# Patient Record
Sex: Male | Born: 1950 | Race: White | Hispanic: No | Marital: Single | Smoking: Never smoker
Health system: Southern US, Community
[De-identification: ages and names within clinical notes are randomized; demographics above are authoritative.]

## PROBLEM LIST (undated history)

## (undated) DIAGNOSIS — M1990 Unspecified osteoarthritis, unspecified site: Secondary | ICD-10-CM

## (undated) DIAGNOSIS — J310 Chronic rhinitis: Secondary | ICD-10-CM

## (undated) DIAGNOSIS — I10 Essential (primary) hypertension: Secondary | ICD-10-CM

## (undated) DIAGNOSIS — F32A Depression, unspecified: Secondary | ICD-10-CM

## (undated) DIAGNOSIS — G4730 Sleep apnea, unspecified: Secondary | ICD-10-CM

## (undated) DIAGNOSIS — T7840XA Allergy, unspecified, initial encounter: Secondary | ICD-10-CM

## (undated) DIAGNOSIS — I499 Cardiac arrhythmia, unspecified: Secondary | ICD-10-CM

## (undated) DIAGNOSIS — F329 Major depressive disorder, single episode, unspecified: Secondary | ICD-10-CM

## (undated) DIAGNOSIS — J329 Chronic sinusitis, unspecified: Secondary | ICD-10-CM

## (undated) DIAGNOSIS — R972 Elevated prostate specific antigen [PSA]: Secondary | ICD-10-CM

## (undated) DIAGNOSIS — F419 Anxiety disorder, unspecified: Secondary | ICD-10-CM

## (undated) DIAGNOSIS — J189 Pneumonia, unspecified organism: Secondary | ICD-10-CM

## (undated) HISTORY — PX: ECTROPION REPAIR: SHX357

## (undated) HISTORY — DX: Unspecified osteoarthritis, unspecified site: M19.90

## (undated) HISTORY — PX: HERNIA REPAIR: SHX51

## (undated) HISTORY — DX: Essential (primary) hypertension: I10

## (undated) HISTORY — DX: Elevated prostate specific antigen (PSA): R97.20

## (undated) HISTORY — PX: NASAL SINUS SURGERY: SHX719

## (undated) HISTORY — PX: OTHER SURGICAL HISTORY: SHX169

## (undated) HISTORY — DX: Chronic rhinitis: J31.0

## (undated) HISTORY — DX: Depression, unspecified: F32.A

## (undated) HISTORY — DX: Anxiety disorder, unspecified: F41.9

## (undated) HISTORY — DX: Chronic sinusitis, unspecified: J32.9

## (undated) HISTORY — DX: Allergy, unspecified, initial encounter: T78.40XA

## (undated) HISTORY — DX: Major depressive disorder, single episode, unspecified: F32.9

## (undated) HISTORY — DX: Sleep apnea, unspecified: G47.30

## (undated) HISTORY — DX: Pneumonia, unspecified organism: J18.9

## (undated) HISTORY — PX: FACIAL RECONSTRUCTION SURGERY: SHX631

---

## 1997-09-08 ENCOUNTER — Ambulatory Visit: Admission: RE | Admit: 1997-09-08 | Discharge: 1997-09-08 | Payer: Self-pay | Admitting: Otolaryngology

## 1997-09-08 ENCOUNTER — Encounter: Payer: Self-pay | Admitting: Internal Medicine

## 2000-06-24 ENCOUNTER — Encounter: Payer: Self-pay | Admitting: Internal Medicine

## 2000-06-24 ENCOUNTER — Ambulatory Visit (HOSPITAL_BASED_OUTPATIENT_CLINIC_OR_DEPARTMENT_OTHER): Admission: RE | Admit: 2000-06-24 | Discharge: 2000-06-24 | Payer: Self-pay | Admitting: Internal Medicine

## 2002-01-08 ENCOUNTER — Encounter: Payer: Self-pay | Admitting: Emergency Medicine

## 2002-01-08 ENCOUNTER — Emergency Department (HOSPITAL_COMMUNITY): Admission: EM | Admit: 2002-01-08 | Discharge: 2002-01-08 | Payer: Self-pay | Admitting: Emergency Medicine

## 2003-04-01 DIAGNOSIS — J189 Pneumonia, unspecified organism: Secondary | ICD-10-CM

## 2003-04-01 HISTORY — DX: Pneumonia, unspecified organism: J18.9

## 2003-09-26 ENCOUNTER — Encounter: Payer: Self-pay | Admitting: Internal Medicine

## 2003-09-26 ENCOUNTER — Ambulatory Visit (HOSPITAL_BASED_OUTPATIENT_CLINIC_OR_DEPARTMENT_OTHER): Admission: RE | Admit: 2003-09-26 | Discharge: 2003-09-26 | Payer: Self-pay | Admitting: Internal Medicine

## 2004-03-01 ENCOUNTER — Ambulatory Visit: Payer: Self-pay | Admitting: Internal Medicine

## 2004-03-01 ENCOUNTER — Ambulatory Visit: Payer: Self-pay | Admitting: Endocrinology

## 2004-03-01 ENCOUNTER — Inpatient Hospital Stay (HOSPITAL_COMMUNITY): Admission: EM | Admit: 2004-03-01 | Discharge: 2004-03-04 | Payer: Self-pay | Admitting: Emergency Medicine

## 2004-03-06 ENCOUNTER — Ambulatory Visit: Payer: Self-pay | Admitting: Internal Medicine

## 2004-03-11 ENCOUNTER — Ambulatory Visit (HOSPITAL_COMMUNITY): Admission: RE | Admit: 2004-03-11 | Discharge: 2004-03-11 | Payer: Self-pay | Admitting: Internal Medicine

## 2004-03-11 ENCOUNTER — Ambulatory Visit: Payer: Self-pay | Admitting: Internal Medicine

## 2004-04-05 ENCOUNTER — Ambulatory Visit (HOSPITAL_COMMUNITY): Admission: RE | Admit: 2004-04-05 | Discharge: 2004-04-05 | Payer: Self-pay | Admitting: Internal Medicine

## 2004-04-05 ENCOUNTER — Ambulatory Visit: Payer: Self-pay | Admitting: Internal Medicine

## 2004-04-15 ENCOUNTER — Ambulatory Visit (HOSPITAL_COMMUNITY): Admission: RE | Admit: 2004-04-15 | Discharge: 2004-04-15 | Payer: Self-pay | Admitting: Internal Medicine

## 2004-04-19 ENCOUNTER — Ambulatory Visit: Payer: Self-pay | Admitting: Internal Medicine

## 2004-05-03 ENCOUNTER — Ambulatory Visit: Payer: Self-pay | Admitting: Internal Medicine

## 2004-05-20 ENCOUNTER — Ambulatory Visit (HOSPITAL_COMMUNITY): Admission: RE | Admit: 2004-05-20 | Discharge: 2004-05-20 | Payer: Self-pay | Admitting: Internal Medicine

## 2004-05-21 ENCOUNTER — Ambulatory Visit: Payer: Self-pay | Admitting: Internal Medicine

## 2004-08-23 ENCOUNTER — Ambulatory Visit: Payer: Self-pay | Admitting: Internal Medicine

## 2004-08-29 ENCOUNTER — Ambulatory Visit: Payer: Self-pay | Admitting: Gastroenterology

## 2004-09-16 ENCOUNTER — Ambulatory Visit: Payer: Self-pay | Admitting: Gastroenterology

## 2004-09-16 ENCOUNTER — Encounter (INDEPENDENT_AMBULATORY_CARE_PROVIDER_SITE_OTHER): Payer: Self-pay | Admitting: *Deleted

## 2004-12-05 ENCOUNTER — Ambulatory Visit: Payer: Self-pay | Admitting: Internal Medicine

## 2005-01-29 ENCOUNTER — Ambulatory Visit: Payer: Self-pay | Admitting: Internal Medicine

## 2005-02-11 ENCOUNTER — Ambulatory Visit: Payer: Self-pay | Admitting: Internal Medicine

## 2005-04-01 ENCOUNTER — Ambulatory Visit: Payer: Self-pay | Admitting: Internal Medicine

## 2006-01-07 ENCOUNTER — Ambulatory Visit: Payer: Self-pay | Admitting: Internal Medicine

## 2006-01-12 ENCOUNTER — Ambulatory Visit: Payer: Self-pay | Admitting: Internal Medicine

## 2006-02-12 ENCOUNTER — Ambulatory Visit: Payer: Self-pay | Admitting: Internal Medicine

## 2006-05-13 ENCOUNTER — Ambulatory Visit: Payer: Self-pay | Admitting: Internal Medicine

## 2006-05-13 LAB — CONVERTED CEMR LAB
ALT: 19 units/L (ref 0–40)
AST: 20 units/L (ref 0–37)
Alkaline Phosphatase: 45 units/L (ref 39–117)
BUN: 11 mg/dL (ref 6–23)
Basophils Relative: 0.3 % (ref 0.0–1.0)
CO2: 32 meq/L (ref 19–32)
Calcium: 9.5 mg/dL (ref 8.4–10.5)
Chloride: 107 meq/L (ref 96–112)
Creatinine, Ser: 0.8 mg/dL (ref 0.4–1.5)
HDL: 35.6 mg/dL — ABNORMAL LOW (ref >39.0)
Hemoglobin: 15 g/dL (ref 13.0–17.0)
LDL Cholesterol: 127 mg/dL — ABNORMAL HIGH (ref 0–99)
Monocytes Absolute: 0.9 10*3/uL — ABNORMAL HIGH (ref 0.2–0.7)
Monocytes Relative: 11.7 % — ABNORMAL HIGH (ref 3.0–11.0)
Potassium: 4.5 meq/L (ref 3.5–5.1)
RDW: 12.2 % (ref 11.5–14.6)
Total Bilirubin: 1 mg/dL (ref 0.3–1.2)
Total Protein: 6.8 g/dL (ref 6.0–8.3)
VLDL: 17 mg/dL (ref 0–40)

## 2006-05-20 ENCOUNTER — Ambulatory Visit: Payer: Self-pay | Admitting: Internal Medicine

## 2006-08-17 ENCOUNTER — Ambulatory Visit: Payer: Self-pay | Admitting: Internal Medicine

## 2007-01-04 ENCOUNTER — Telehealth: Payer: Self-pay | Admitting: Family Medicine

## 2007-02-01 ENCOUNTER — Encounter: Payer: Self-pay | Admitting: Internal Medicine

## 2007-02-09 ENCOUNTER — Ambulatory Visit: Payer: Self-pay | Admitting: Internal Medicine

## 2007-02-09 DIAGNOSIS — G4733 Obstructive sleep apnea (adult) (pediatric): Secondary | ICD-10-CM

## 2007-02-09 DIAGNOSIS — F5104 Psychophysiologic insomnia: Secondary | ICD-10-CM

## 2007-02-09 DIAGNOSIS — J4520 Mild intermittent asthma, uncomplicated: Secondary | ICD-10-CM

## 2007-02-09 DIAGNOSIS — F329 Major depressive disorder, single episode, unspecified: Secondary | ICD-10-CM

## 2007-02-09 DIAGNOSIS — I10 Essential (primary) hypertension: Secondary | ICD-10-CM | POA: Insufficient documentation

## 2007-02-15 ENCOUNTER — Ambulatory Visit: Payer: Self-pay | Admitting: Internal Medicine

## 2007-03-01 HISTORY — PX: VENTRAL HERNIA REPAIR: SHX424

## 2007-03-12 ENCOUNTER — Encounter: Payer: Self-pay | Admitting: Internal Medicine

## 2007-03-16 ENCOUNTER — Ambulatory Visit (HOSPITAL_COMMUNITY): Admission: RE | Admit: 2007-03-16 | Discharge: 2007-03-16 | Payer: Self-pay | Admitting: Surgery

## 2007-03-16 ENCOUNTER — Encounter: Payer: Self-pay | Admitting: Internal Medicine

## 2007-04-05 ENCOUNTER — Encounter: Payer: Self-pay | Admitting: Internal Medicine

## 2007-05-07 ENCOUNTER — Telehealth: Payer: Self-pay | Admitting: Internal Medicine

## 2007-05-07 ENCOUNTER — Ambulatory Visit: Payer: Self-pay | Admitting: Internal Medicine

## 2007-06-15 ENCOUNTER — Telehealth: Payer: Self-pay | Admitting: Internal Medicine

## 2007-07-15 ENCOUNTER — Encounter: Payer: Self-pay | Admitting: Internal Medicine

## 2007-07-19 ENCOUNTER — Telehealth: Payer: Self-pay | Admitting: Internal Medicine

## 2007-08-10 ENCOUNTER — Encounter: Payer: Self-pay | Admitting: Internal Medicine

## 2007-08-16 ENCOUNTER — Ambulatory Visit: Payer: Self-pay | Admitting: Internal Medicine

## 2007-08-31 ENCOUNTER — Telehealth: Payer: Self-pay | Admitting: Internal Medicine

## 2007-09-24 ENCOUNTER — Encounter: Payer: Self-pay | Admitting: Internal Medicine

## 2007-10-04 ENCOUNTER — Telehealth: Payer: Self-pay | Admitting: Internal Medicine

## 2007-11-09 ENCOUNTER — Telehealth (INDEPENDENT_AMBULATORY_CARE_PROVIDER_SITE_OTHER): Payer: Self-pay | Admitting: *Deleted

## 2007-12-15 ENCOUNTER — Telehealth: Payer: Self-pay | Admitting: Internal Medicine

## 2007-12-27 ENCOUNTER — Telehealth: Payer: Self-pay | Admitting: Internal Medicine

## 2008-02-02 ENCOUNTER — Telehealth (INDEPENDENT_AMBULATORY_CARE_PROVIDER_SITE_OTHER): Payer: Self-pay | Admitting: *Deleted

## 2008-02-04 ENCOUNTER — Ambulatory Visit: Payer: Self-pay | Admitting: Gastroenterology

## 2008-02-14 ENCOUNTER — Ambulatory Visit: Payer: Self-pay | Admitting: Gastroenterology

## 2008-03-15 ENCOUNTER — Telehealth (INDEPENDENT_AMBULATORY_CARE_PROVIDER_SITE_OTHER): Payer: Self-pay | Admitting: *Deleted

## 2008-04-03 ENCOUNTER — Ambulatory Visit: Payer: Self-pay | Admitting: Internal Medicine

## 2008-06-01 ENCOUNTER — Encounter: Payer: Self-pay | Admitting: Internal Medicine

## 2008-06-05 ENCOUNTER — Encounter: Payer: Self-pay | Admitting: Internal Medicine

## 2008-06-05 ENCOUNTER — Encounter: Admission: RE | Admit: 2008-06-05 | Discharge: 2008-06-05 | Payer: Self-pay | Admitting: Surgery

## 2008-06-12 ENCOUNTER — Telehealth: Payer: Self-pay | Admitting: Internal Medicine

## 2008-07-07 ENCOUNTER — Telehealth: Payer: Self-pay | Admitting: Internal Medicine

## 2008-07-18 ENCOUNTER — Telehealth: Payer: Self-pay | Admitting: Internal Medicine

## 2008-07-28 ENCOUNTER — Ambulatory Visit: Payer: Self-pay | Admitting: Internal Medicine

## 2008-07-28 DIAGNOSIS — M25569 Pain in unspecified knee: Secondary | ICD-10-CM | POA: Insufficient documentation

## 2008-08-15 ENCOUNTER — Encounter: Payer: Self-pay | Admitting: Internal Medicine

## 2008-08-15 ENCOUNTER — Telehealth: Payer: Self-pay | Admitting: Internal Medicine

## 2008-08-16 ENCOUNTER — Inpatient Hospital Stay (HOSPITAL_COMMUNITY): Admission: AD | Admit: 2008-08-16 | Discharge: 2008-08-20 | Payer: Self-pay | Admitting: Surgery

## 2008-08-16 ENCOUNTER — Encounter (INDEPENDENT_AMBULATORY_CARE_PROVIDER_SITE_OTHER): Payer: Self-pay | Admitting: Surgery

## 2008-08-18 ENCOUNTER — Encounter: Payer: Self-pay | Admitting: Internal Medicine

## 2008-08-23 ENCOUNTER — Encounter: Payer: Self-pay | Admitting: Internal Medicine

## 2008-08-23 ENCOUNTER — Telehealth: Payer: Self-pay | Admitting: Internal Medicine

## 2008-08-30 ENCOUNTER — Telehealth: Payer: Self-pay | Admitting: *Deleted

## 2008-09-05 ENCOUNTER — Ambulatory Visit: Payer: Self-pay | Admitting: Internal Medicine

## 2008-09-05 LAB — CONVERTED CEMR LAB
AST: 16 units/L (ref 0–37)
Albumin: 4.1 g/dL (ref 3.5–5.2)
Alkaline Phosphatase: 48 units/L (ref 39–117)
Basophils Absolute: 0 10*3/uL (ref 0.0–0.1)
Basophils Relative: 0.2 % (ref 0.0–3.0)
Bilirubin, Direct: 0.1 mg/dL (ref 0.0–0.3)
Blood in Urine, dipstick: NEGATIVE
Calcium: 8.8 mg/dL (ref 8.4–10.5)
Creatinine, Ser: 0.7 mg/dL (ref 0.4–1.5)
GFR calc non Af Amer: 123.57 mL/min (ref >60)
HDL: 31.3 mg/dL — ABNORMAL LOW (ref >39.00)
Hemoglobin: 13.8 g/dL (ref 13.0–17.0)
Lymphocytes Relative: 16.2 % (ref 12.0–46.0)
Monocytes Relative: 13.3 % — ABNORMAL HIGH (ref 3.0–12.0)
Neutro Abs: 4.9 10*3/uL (ref 1.4–7.7)
Neutrophils Relative %: 63.3 % (ref 43.0–77.0)
Nitrite: NEGATIVE
Protein, U semiquant: NEGATIVE
RBC: 4.05 M/uL — ABNORMAL LOW (ref 4.22–5.81)
RDW: 13.1 % (ref 11.5–14.6)
Sodium: 138 meq/L (ref 135–145)
Total CHOL/HDL Ratio: 5
Triglycerides: 74 mg/dL (ref 0.0–149.0)
Urobilinogen, UA: 0.2
VLDL: 14.8 mg/dL (ref 0.0–40.0)

## 2008-09-15 ENCOUNTER — Telehealth (INDEPENDENT_AMBULATORY_CARE_PROVIDER_SITE_OTHER): Payer: Self-pay | Admitting: *Deleted

## 2008-09-22 ENCOUNTER — Telehealth: Payer: Self-pay | Admitting: *Deleted

## 2008-09-22 ENCOUNTER — Ambulatory Visit: Payer: Self-pay | Admitting: Internal Medicine

## 2008-09-22 DIAGNOSIS — D649 Anemia, unspecified: Secondary | ICD-10-CM

## 2008-09-22 DIAGNOSIS — R972 Elevated prostate specific antigen [PSA]: Secondary | ICD-10-CM

## 2008-10-13 ENCOUNTER — Ambulatory Visit: Payer: Self-pay | Admitting: Internal Medicine

## 2008-11-02 ENCOUNTER — Ambulatory Visit: Payer: Self-pay | Admitting: Internal Medicine

## 2008-11-02 LAB — CONVERTED CEMR LAB
Basophils Relative: 0.1 % (ref 0.0–3.0)
Eosinophils Absolute: 0.7 10*3/uL (ref 0.0–0.7)
Eosinophils Relative: 10.1 % — ABNORMAL HIGH (ref 0.0–5.0)
HCT: 39.2 % (ref 39.0–52.0)
Hemoglobin: 14 g/dL (ref 13.0–17.0)
Lymphs Abs: 0.7 10*3/uL (ref 0.7–4.0)
MCHC: 35.8 g/dL (ref 30.0–36.0)
MCV: 97 fL (ref 78.0–100.0)
Monocytes Absolute: 0.7 10*3/uL (ref 0.1–1.0)
Neutro Abs: 4.9 10*3/uL (ref 1.4–7.7)
Neutrophils Relative %: 69.8 % (ref 43.0–77.0)
RBC: 4.04 M/uL — ABNORMAL LOW (ref 4.22–5.81)
Saturation Ratios: 14.8 % — ABNORMAL LOW (ref 20.0–50.0)
Transferrin: 227.3 mg/dL (ref 212.0–360.0)
WBC: 7 10*3/uL (ref 4.5–10.5)

## 2008-12-07 ENCOUNTER — Telehealth (INDEPENDENT_AMBULATORY_CARE_PROVIDER_SITE_OTHER): Payer: Self-pay | Admitting: *Deleted

## 2008-12-12 ENCOUNTER — Ambulatory Visit: Payer: Self-pay | Admitting: Internal Medicine

## 2008-12-12 DIAGNOSIS — D696 Thrombocytopenia, unspecified: Secondary | ICD-10-CM | POA: Insufficient documentation

## 2008-12-20 ENCOUNTER — Telehealth: Payer: Self-pay | Admitting: *Deleted

## 2008-12-28 ENCOUNTER — Encounter: Payer: Self-pay | Admitting: Internal Medicine

## 2009-01-12 ENCOUNTER — Telehealth: Payer: Self-pay | Admitting: *Deleted

## 2009-01-18 ENCOUNTER — Telehealth: Payer: Self-pay | Admitting: Internal Medicine

## 2009-03-08 ENCOUNTER — Ambulatory Visit: Payer: Self-pay | Admitting: Internal Medicine

## 2009-03-08 LAB — CONVERTED CEMR LAB
Basophils Absolute: 0 10*3/uL (ref 0.0–0.1)
Eosinophils Relative: 8.2 % — ABNORMAL HIGH (ref 0.0–5.0)
HCT: 41.2 % (ref 39.0–52.0)
Hemoglobin: 14.1 g/dL (ref 13.0–17.0)
Lymphocytes Relative: 15 % (ref 12.0–46.0)
Lymphs Abs: 1.1 10*3/uL (ref 0.7–4.0)
Monocytes Relative: 13.2 % — ABNORMAL HIGH (ref 3.0–12.0)
Neutro Abs: 4.5 10*3/uL (ref 1.4–7.7)
RBC: 4.15 M/uL — ABNORMAL LOW (ref 4.22–5.81)
RDW: 12.7 % (ref 11.5–14.6)
WBC: 7.1 10*3/uL (ref 4.5–10.5)

## 2009-03-14 ENCOUNTER — Telehealth: Payer: Self-pay | Admitting: Internal Medicine

## 2009-03-14 ENCOUNTER — Ambulatory Visit: Payer: Self-pay | Admitting: Internal Medicine

## 2009-03-14 DIAGNOSIS — M7580 Other shoulder lesions, unspecified shoulder: Secondary | ICD-10-CM

## 2009-03-29 ENCOUNTER — Encounter: Payer: Self-pay | Admitting: Internal Medicine

## 2009-03-31 HISTORY — PX: OTHER SURGICAL HISTORY: SHX169

## 2009-04-09 ENCOUNTER — Telehealth: Payer: Self-pay | Admitting: Internal Medicine

## 2009-05-03 ENCOUNTER — Ambulatory Visit (HOSPITAL_COMMUNITY): Admission: RE | Admit: 2009-05-03 | Discharge: 2009-05-04 | Payer: Self-pay | Admitting: Orthopedic Surgery

## 2009-05-14 ENCOUNTER — Telehealth: Payer: Self-pay | Admitting: Internal Medicine

## 2009-05-15 ENCOUNTER — Ambulatory Visit: Payer: Self-pay | Admitting: Internal Medicine

## 2009-05-17 ENCOUNTER — Telehealth: Payer: Self-pay | Admitting: Internal Medicine

## 2009-07-02 ENCOUNTER — Telehealth: Payer: Self-pay | Admitting: Internal Medicine

## 2009-07-09 ENCOUNTER — Telehealth: Payer: Self-pay | Admitting: *Deleted

## 2009-07-10 ENCOUNTER — Ambulatory Visit: Payer: Self-pay | Admitting: Internal Medicine

## 2009-07-11 ENCOUNTER — Telehealth: Payer: Self-pay | Admitting: Internal Medicine

## 2009-08-14 ENCOUNTER — Telehealth (INDEPENDENT_AMBULATORY_CARE_PROVIDER_SITE_OTHER): Payer: Self-pay | Admitting: *Deleted

## 2009-09-10 ENCOUNTER — Ambulatory Visit: Payer: Self-pay | Admitting: Internal Medicine

## 2009-09-10 LAB — CONVERTED CEMR LAB
Bilirubin Urine: NEGATIVE
Blood in Urine, dipstick: NEGATIVE
Ketones, urine, test strip: NEGATIVE
Nitrite: NEGATIVE
Specific Gravity, Urine: 1.02
Urobilinogen, UA: 0.2

## 2009-09-11 LAB — CONVERTED CEMR LAB
BUN: 17 mg/dL (ref 6–23)
Basophils Absolute: 0 10*3/uL (ref 0.0–0.1)
Bilirubin, Direct: 0.1 mg/dL (ref 0.0–0.3)
CO2: 29 meq/L (ref 19–32)
Calcium: 9 mg/dL (ref 8.4–10.5)
Chloride: 108 meq/L (ref 96–112)
Cholesterol: 150 mg/dL (ref 0–200)
Creatinine, Ser: 0.6 mg/dL (ref 0.4–1.5)
Eosinophils Absolute: 0.6 10*3/uL (ref 0.0–0.7)
HDL: 33.3 mg/dL — ABNORMAL LOW (ref >39.00)
LDL Cholesterol: 108 mg/dL — ABNORMAL HIGH (ref 0–99)
Lymphocytes Relative: 16.1 % (ref 12.0–46.0)
MCHC: 34.8 g/dL (ref 30.0–36.0)
MCV: 98.1 fL (ref 78.0–100.0)
Monocytes Absolute: 0.8 10*3/uL (ref 0.1–1.0)
Neutrophils Relative %: 62.6 % (ref 43.0–77.0)
PSA: 3.77 ng/mL (ref 0.10–4.00)
Platelets: 151 10*3/uL (ref 150.0–400.0)
RDW: 13.4 % (ref 11.5–14.6)
Total Bilirubin: 0.6 mg/dL (ref 0.3–1.2)
Total Protein: 5.9 g/dL — ABNORMAL LOW (ref 6.0–8.3)
Triglycerides: 43 mg/dL (ref 0.0–149.0)

## 2009-09-12 ENCOUNTER — Encounter: Payer: Self-pay | Admitting: Internal Medicine

## 2009-09-17 ENCOUNTER — Ambulatory Visit: Payer: Self-pay | Admitting: Internal Medicine

## 2009-09-17 DIAGNOSIS — E669 Obesity, unspecified: Secondary | ICD-10-CM

## 2009-09-17 DIAGNOSIS — M1710 Unilateral primary osteoarthritis, unspecified knee: Secondary | ICD-10-CM

## 2009-09-17 DIAGNOSIS — I4510 Unspecified right bundle-branch block: Secondary | ICD-10-CM

## 2009-09-26 ENCOUNTER — Telehealth: Payer: Self-pay | Admitting: Internal Medicine

## 2009-10-15 ENCOUNTER — Telehealth: Payer: Self-pay | Admitting: *Deleted

## 2009-10-17 ENCOUNTER — Encounter: Payer: Self-pay | Admitting: Internal Medicine

## 2009-11-05 ENCOUNTER — Telehealth (INDEPENDENT_AMBULATORY_CARE_PROVIDER_SITE_OTHER): Payer: Self-pay | Admitting: *Deleted

## 2009-12-24 ENCOUNTER — Telehealth (INDEPENDENT_AMBULATORY_CARE_PROVIDER_SITE_OTHER): Payer: Self-pay | Admitting: *Deleted

## 2010-01-28 ENCOUNTER — Ambulatory Visit: Payer: Self-pay | Admitting: Internal Medicine

## 2010-01-29 HISTORY — PX: REPLACEMENT TOTAL KNEE: SUR1224

## 2010-02-04 ENCOUNTER — Inpatient Hospital Stay (HOSPITAL_COMMUNITY): Admission: RE | Admit: 2010-02-04 | Discharge: 2010-02-06 | Payer: Self-pay | Admitting: Orthopedic Surgery

## 2010-02-13 ENCOUNTER — Telehealth (INDEPENDENT_AMBULATORY_CARE_PROVIDER_SITE_OTHER): Payer: Self-pay | Admitting: *Deleted

## 2010-03-01 ENCOUNTER — Ambulatory Visit: Payer: Self-pay | Admitting: Internal Medicine

## 2010-03-21 ENCOUNTER — Ambulatory Visit: Payer: Self-pay | Admitting: Internal Medicine

## 2010-03-21 DIAGNOSIS — Z96659 Presence of unspecified artificial knee joint: Secondary | ICD-10-CM

## 2010-03-21 LAB — CONVERTED CEMR LAB: Hemoglobin: 14.1 g/dL

## 2010-04-05 ENCOUNTER — Encounter: Payer: Self-pay | Admitting: Internal Medicine

## 2010-04-26 ENCOUNTER — Telehealth: Payer: Self-pay | Admitting: *Deleted

## 2010-04-30 ENCOUNTER — Telehealth (INDEPENDENT_AMBULATORY_CARE_PROVIDER_SITE_OTHER): Payer: Self-pay | Admitting: *Deleted

## 2010-04-30 NOTE — Progress Notes (Signed)
Summary: needs refill  Phone Note Call from Patient Call back at [phone]   Caller: Patient Call For: [name] Reason for Call: Refill Medication Summary of Call: patient has an apt on april 12th, his medicine was denied because he hasnt seen dr Debbie Bellucci. he need his nasonex refilled now. he is out. he just needs a rx to last until he can see dr Lovis More he also needs a rx for conscerta, usally its mailed to his home address.  he satated if he didnt get these filled now then he will be needing to go to another doc.   [facility] [address] Initial call taken by: Valinda Hoar,  May 17, 2009 12:33 PM  Follow-up for Phone Call        Please advise if okay to print rx for concerta and if so how much. Thanks. Carron Curie CMA  May 17, 2009 1:54 PM   Ok to refill concerta and nasonex as we did in past and please shedule appt for pt in the next month or so. Reynaldo Minium CMA  May 17, 2009 4:15 PM   rx printed for concerta, pt aaware will be mailed tomorrow, also nasonex rx sent. pt aware.Carron Curie CMA  May 17, 2009 4:28 PM     Prescriptions: NASONEX 50 MCG/ACT SUSP (MOMETASONE FUROATE) 1-2 sprays in each nostril daily  #17 [place] x 2   Entered by:   Carron Curie CMA   Authorized by:   Waymon Budge MD   Signed by:   Carron Curie CMA on 05/17/2009   Method used:   [facility]phoned to ...       [facility]  [address] [phone]* (retail)       [address]       [place], [place]  [address]       Ph: [phone] or [phone]       Fax: [phone]   RxID:   [id number] CONCERTA 27 MG  TBCR (METHYLPHENIDATE HCL) 1 by mouth once daily  #30 x 0   Entered by:   Carron Curie CMA   Authorized by:   Waymon Budge MD   Signed by:   Carron Curie CMA on 05/17/2009   Method used:   Print then Give to Patient   RxID:   [id number]

## 2010-04-30 NOTE — Progress Notes (Signed)
Summary: prescript for concerta  Phone Note Call from Patient Call back at [phone]   Caller: Patient Call For: young Summary of Call: need concerta ( generic ) prescript mail to his home Initial call taken by: Rickard Patience,  December 24, 2009 3:22 PM  Follow-up for Phone Call        pt last seen by CY 07/10/2009 and told to f/u in 6 months.  Currently, no pending appts.  Pt last given rx for Concerta 11/05/2009 for # 30 x0  refills.  Printed rx and put on CY's cart for him to sign.  Aundra Millet Reynolds LPN  December 24, 2009 3:30 PM   Additional Follow-up for Phone Call Additional follow up Details #1::        mailed to patient on 12-25-09.Reynaldo Minium CMA  December 25, 2009 11:51 AM     Prescriptions: CONCERTA 27 MG  TBCR (METHYLPHENIDATE HCL) 1 by mouth once daily  #30 x 0   Entered by:   Arman Filter LPN   Authorized by:   Waymon Budge MD   Signed by:   Arman Filter LPN on 36/14/4315   Method used:   Print then Mail to Patient   RxID:   [id number]

## 2010-04-30 NOTE — Miscellaneous (Signed)
Summary: Orders Update pft charges  Clinical Lists Changes  Orders: Added new Service order of Carbon Monoxide diffusing w/capacity (94720) - Signed Added new Service order of Lung Volumes (94240) - Signed Added new Service order of Spirometry (Pre & Post) (94060) - Signed 

## 2010-04-30 NOTE — Assessment & Plan Note (Signed)
Summary: form completion for surgery/ssc   Vital Signs:  Patient profile:   60 year old male Weight:      214 pounds Pulse rate:   78 / minute BP sitting:   130 / 80  (left arm) Cuff size:   regular  Vitals Entered By: Romualdo Bolk, CMA (AAMA) (January 28, 2010 10:41 AM) CC: Form Completion for Left Knee Replacement on 11/7   History of Present Illness: [name] comes in today  for  preoperative evaluation for knee surgery left  TKR to be done on november 7th  by Dr Charlann Boxer.   His knees are problematic and left is boneon bone and limits  mobility and pain  continues.  Since his lasst visit 5 months ago he has  been doing well.  Asthma : stable except seasonal change   on  accolate , ,and  and as needed combivent.  last rescue inhaler  use  weeks ago and only once.    no flare in recent memory.  Bp :  has been ok.   no neuro vascular   Chest pain.     Preventive Screening-Counseling & Management  Alcohol-Tobacco     Alcohol drinks/day: 0     Smoking Status: quit     Year Quit: 1992  Caffeine-Diet-Exercise     Caffeine use/day: 6     Does Patient Exercise: no  Current Medications (verified): 1)  Accolate 20 Mg Tabs (Zafirlukast) .Marland Kitchen.. 1 By Mouth Two Times A Day 2)  Advair Diskus 100-50 Mcg/dose Misc (Fluticasone-Salmeterol) .Marland Kitchen.. 1 Puff Two Times A Day 3)  Combivent 103-18 Mcg/act  Aero (Ipratropium-Albuterol) .... 2 Puffs Up To Four Times A Day As Needed 4)  Amlodipine Besy-Benazepril Hcl 5-10 Mg Caps (Amlodipine Besy-Benazepril Hcl) .Marland Kitchen.. 1 By Mouth Once Daily 5)  Astelin 137 Mcg/spray Soln (Azelastine Hcl) .... Use As Directed 6)  Fexofenadine Hcl 180 Mg Tabs (Fexofenadine Hcl) .Marland Kitchen.. 1 By Mouth Once Daily 7)  Ibuprofen 800 Mg Tabs (Ibuprofen) .Marland Kitchen.. 1 By Mouth Q 8 Hours As Needed 8)  Lexapro 20 Mg Tabs (Escitalopram Oxalate) .Marland Kitchen.. 1 1/2 By Mouth Once Daily 9)  Concerta 27 Mg  Tbcr (Methylphenidate Hcl) .Marland Kitchen.. 1 By Mouth Once Daily 10)  Lunesta 3 Mg Tabs (Eszopiclone)  .... Take 1 Tablet By Mouth At Bedtime 11)  Cpap 15   Advanced 12)  Nasonex 50 Mcg/act Susp (Mometasone Furoate) .Marland Kitchen.. 1-2 Sprays in Each Nostril Daily 13)  Lac-Hydrin 12 % Lotn (Ammonium Lactate) .... Uad As Needed 14)  Uroxatral 10 Mg Xr24h-Tab (Alfuzosin Hcl) .Marland Kitchen.. 1 After Meals 15)  Vicodin 5-500 Mg Tabs (Hydrocodone-Acetaminophen) 16)  Replacement Cpap Machine .... Current Pressure Humidifier 17)  Protriptyline Hcl 10 Mg Tabs (Protriptyline Hcl) .... Take 1 By Mouth At Bedtime  Allergies (verified): No Known Drug Allergies  Past History:  Past medical, surgical, family and social histories (including risk factors) reviewed, and no changes noted (except as noted below).  Past Medical History: Reviewed history from 09/17/2009 and no changes required. Asthma hx rhinosinusitis Depression Hypertension  nl stress cardiolite in 1998 sleep apnea arthritis knees shoulder  spur Elevated PSA   [place] and bx negative 2011   Hosp: pneumonia right 2005.  Past Surgical History: Reviewed history from 09/17/2009 and no changes required. Sinus surgery  facial reconstruction surgery  ventral hernia repair 12-08 Rerepair ventral hernia  winter 2010 Rt Shoulder arthroscopic surgery  Past History:  Care Management: Pulmonary:Young Urology:Peterson Surgery:Tseui Gastroenterology:[facility] Psychologist:[facility] : Supple/Olin  Family  History: Reviewed history from 07/28/2008 and no changes required. Thaxton mv replacement Fa Family History of Asthma Family History Hypertension DM MGM Mother- alive 56 Father- died age 51; heart failure Sister- alive age 27 good health  Brother- alive age 25 good health Brother- alive age 39 good health    Social History: Reviewed history from 07/28/2008 and no changes required. 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         ocass stress HA   no cp sob on exertion   abrasion left leg  from moving branches in yard this week.  Physical Exam  General:  Well-developed,well-nourished,in no acute distress; alert,appropriate and cooperative throughout examination Head:  normocephalic.   Eyes:  vision grossly intact.   Neck:  No deformities, masses, or tenderness noted. no bruits Lungs:  Normal respiratory effort, chest expands symmetrically. Lungs are clear to auscultation, no crackles or wheezes.no dullness.   Heart:  Normal rate and regular rhythm. S1 and S2 normal without gallop, murmur, click, rub or other extra sounds. Abdomen:  Bowel sounds positive,abdomen soft and non-tender without masses, organomegaly or   noted. well healed lower mid line hernia scar   no hernia.  Pulses:  nl cap refill  Extremities:  no clubbing cyanosis or edema  Neurologic:  alert & oriented X3.  non focal grossly Skin:  small superficial abrasion anterior leg left .   Cervical Nodes:  No lymphadenopathy noted Psych:  Oriented X3, good eye contact, not anxious appearing, and not depressed appearing.     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 1 by mouth q 8 hours as needed    Vicodin 5-500 Mg Tabs (Hydrocodone-acetaminophen)  Problem # 2:  PREOPERATIVE EXAMINATION (ICD-V72.84) no C/I to surgery  .  disc    usea ntibiotic ointmen on mior abrasion left   pre op. call if needed   form completed and to be faxed . counseled   Problem # 3:  HYPERTENSION (ICD-401.9) Assessment: Unchanged  His updated medication list for this problem  includes:    Amlodipine Besy-benazepril Hcl 5-10 Mg Caps (Amlodipine besy-benazepril hcl) .Marland Kitchen... 1 by mouth once daily  BP today: 130/80 Prior BP: 138/80 (09/17/2009)  Prior 10 Yr Risk Heart Disease: 27 % (03/14/2009)  Labs Reviewed: K+: 4.6 (09/10/2009) Creat: : 0.6 (09/10/2009)   Chol: 150 (09/10/2009)   HDL: 33.30 (09/10/2009)   LDL: 108 (09/10/2009)   TG: 43.0 (09/10/2009)  Problem # 4:  ASTHMA (ICD-493.90) Assessment: Unchanged controlled  His updated medication list for this problem includes:    Accolate 20 Mg Tabs (Zafirlukast) .Marland Kitchen... 1 by mouth two times a day    Advair Diskus 100-50 Mcg/dose Misc (Fluticasone-salmeterol) .Marland Kitchen... 1 puff two times a day    Combivent 103-18 Mcg/act Aero (Ipratropium-albuterol) .Marland Kitchen... 2 puffs up to four times a day as needed  Problem # 5:  SLEEP DISORDER (ICD-780.50) samples of lunesta  given   Complete Medication List: 1)  Accolate 20 Mg Tabs (Zafirlukast) .Marland Kitchen.. 1 by mouth  two times a day 2)  Advair Diskus 100-50 Mcg/dose Misc (Fluticasone-salmeterol) .Marland Kitchen.. 1 puff two times a day 3)  Combivent 103-18 Mcg/act Aero (Ipratropium-albuterol) .... 2 puffs up to four times a day as needed 4)  Amlodipine Besy-benazepril Hcl 5-10 Mg Caps (Amlodipine besy-benazepril hcl) .Marland Kitchen.. 1 by mouth once daily 5)  Astelin 137 Mcg/spray Soln (Azelastine hcl) .... Use as directed 6)  Fexofenadine Hcl 180 Mg Tabs (Fexofenadine hcl) .Marland Kitchen.. 1 by mouth once daily 7)  Ibuprofen 800 Mg Tabs (Ibuprofen) .Marland Kitchen.. 1 by mouth q 8 hours as needed 8)  Lexapro 20 Mg Tabs (Escitalopram oxalate) .Marland Kitchen.. 1 1/2 by mouth once daily 9)  Concerta 27 Mg Tbcr (Methylphenidate hcl) .Marland Kitchen.. 1 by mouth once daily 10)  Lunesta 3 Mg Tabs (Eszopiclone) .... Take 1 tablet by mouth at bedtime 11)  Cpap 15 Advanced  12)  Nasonex 50 Mcg/act Susp (Mometasone furoate) .Marland Kitchen.. 1-2 sprays in each nostril daily 13)  Lac-hydrin 12 % Lotn (Ammonium lactate) .... Uad as needed 14)  Uroxatral 10 Mg Xr24h-tab (Alfuzosin hcl)  .Marland Kitchen.. 1 after meals 15)  Vicodin 5-500 Mg Tabs (Hydrocodone-acetaminophen) 16)  Replacement Cpap Machine  .... Current pressure humidifier 17)  Protriptyline Hcl 10 Mg Tabs (Protriptyline hcl) .... Take 1 by mouth at bedtime  Other Orders: Admin 1st Vaccine (78295) Flu Vaccine 24yrs + [phone])   Orders Added: 1)  Admin 1st Vaccine [90471] 2)  Flu Vaccine 89yrs + [86578] 3)  Est. Patient Level IV [46962]     Flu Vaccine Consent Questions     Do you have a history of severe allergic reactions to this vaccine? no    Any prior history of allergic reactions to egg and/or gelatin? no    Do you have a sensitivity to the preservative Thimersol? no    Do you have a past history of Guillan-Barre Syndrome? no    Do you currently have an acute febrile illness? no    Have you ever had a severe reaction to latex? no    Vaccine information given and explained to patient? yes    Are you currently pregnant? no    Lot Number:[id number]   Exp Date:09/28/2010   Site Given  Left Deltoid IM .lbflu Romualdo Bolk, CMA (AAMA)  January 28, 2010 10:45 AM  Samples of lunesta 3 given 10#

## 2010-04-30 NOTE — Letter (Signed)
Summary: Request for Surgical Clearance/[facility]  Request for Surgical Clearance/[facility]   Imported By: Maryln Gottron 01/29/2010 13:23:35  _____________________________________________________________________  External Attachment:    Type:   Image     Comment:   External Document

## 2010-04-30 NOTE — Assessment & Plan Note (Signed)
Summary: flu like symptoms/ssc   Vital Signs:  Patient profile:   60 year old male Weight:      213 pounds O2 Sat:      98 % on Room air Temp:     97.5 degrees F Pulse rate:   78 / minute BP sitting:   110 / 80  (left arm) Cuff size:   large  Vitals Entered By: Romualdo Bolk, CMA (AAMA) (May 15, 2009 9:46 AM)  O2 Flow:  Room air CC: pt c/o sore thoart, feeling fluish, aches, pains & feels contant drainage down the thoart. no weezing. started on 05/10/2009. Pt was on PCN for dental procedure last week prior to getting sick.   History of Present Illness: [name] comesin today for   for SDA   onset 5 days ago  felt fluishs with st myalgias and malaise .  hoarse  . slightly better today.   No wheezing.     Sent home from work yesterday.  Did get the flu shot and his Asthma seems to be stable . No increase inhaler use. NO NVD . NO new rashes .Took ibuprofen   temporarily.   ? chilled but didnt take temp.      Bp has been ok.  No change in other problems.    Preventive Screening-Counseling & Management  Alcohol-Tobacco     Alcohol drinks/day: 0     Smoking Status: quit     Year Quit: 1992  Caffeine-Diet-Exercise     Caffeine use/day: 6     Does Patient Exercise: no  Medications Prior to Update: 1)  Albuterol 90 Mcg/act  Aers (Albuterol) .... Use 2 Sprays Every Four To Six Hours As Needed 2)  Accolate 20 Mg Tabs (Zafirlukast) .Marland Kitchen.. 1 By Mouth Two Times A Day 3)  Advair Diskus 100-50 Mcg/dose Misc (Fluticasone-Salmeterol) .Marland Kitchen.. 1 Puff Two Times A Day 4)  Combivent 103-18 Mcg/act  Aero (Ipratropium-Albuterol) .... 2 Puffs Up To Four Times A Day As Needed 5)  Amlodipine Besy-Benazepril Hcl 5-10 Mg Caps (Amlodipine Besy-Benazepril Hcl) .Marland Kitchen.. 1 By Mouth Once Daily 6)  Astelin 137 Mcg/spray Soln (Azelastine Hcl) .... Use As Directed 7)  Fexofenadine Hcl 180 Mg Tabs (Fexofenadine Hcl) .Marland Kitchen.. 1 By Mouth Once Daily 8)  Ibuprofen 800 Mg Tabs (Ibuprofen) .Marland Kitchen.. 1 By Mouth Q 8  Hours As Needed 9)  Lexapro 20 Mg Tabs (Escitalopram Oxalate) .Marland Kitchen.. 1 1/2 By Mouth Once Daily 10)  Concerta 27 Mg  Tbcr (Methylphenidate Hcl) .Marland Kitchen.. 1 By Mouth Once Daily 11)  Lunesta 3 Mg Tabs (Eszopiclone) .... Take 1 Tablet By Mouth At Bedtime 12)  Vivactil 10 Mg  Tabs (Protriptyline Hcl) .Marland Kitchen.. 1 At Night 13)  Cpap  Advanced 14)  Nasonex 50 Mcg/act Susp (Mometasone Furoate) .Marland Kitchen.. 1-2 Sprays in Each Nostril Daily 15)  Lac-Hydrin 12 % Lotn (Ammonium Lactate) .... Uad As Needed 16)  Uroxatral 10 Mg Xr24h-Tab (Alfuzosin Hcl) .Marland Kitchen.. 1 After Meals 17)  Doxycycline Hyclate 100 Mg Caps (Doxycycline Hyclate) .Marland Kitchen.. 1 By Mouth Two Times A Day 18)  Methylphenidate Hcl 27 Mg .... Take 1 Tablet By Mouth Once A Day 19)  Vicodin 5-500 Mg Tabs (Hydrocodone-Acetaminophen)  Current Medications (verified): 1)  Albuterol 90 Mcg/act  Aers (Albuterol) .... Use 2 Sprays Every Four To Six Hours As Needed 2)  Accolate 20 Mg Tabs (Zafirlukast) .Marland Kitchen.. 1 By Mouth Two Times A Day 3)  Advair Diskus 100-50 Mcg/dose Misc (Fluticasone-Salmeterol) .Marland Kitchen.. 1 Puff Two Times A Day 4)  Combivent 103-18 Mcg/act  Aero (Ipratropium-Albuterol) .... 2 Puffs Up To Four Times A Day As Needed 5)  Amlodipine Besy-Benazepril Hcl 5-10 Mg Caps (Amlodipine Besy-Benazepril Hcl) .Marland Kitchen.. 1 By Mouth Once Daily 6)  Astelin 137 Mcg/spray Soln (Azelastine Hcl) .... Use As Directed 7)  Fexofenadine Hcl 180 Mg Tabs (Fexofenadine Hcl) .Marland Kitchen.. 1 By Mouth Once Daily 8)  Ibuprofen 800 Mg Tabs (Ibuprofen) .Marland Kitchen.. 1 By Mouth Q 8 Hours As Needed 9)  Lexapro 20 Mg Tabs (Escitalopram Oxalate) .Marland Kitchen.. 1 1/2 By Mouth Once Daily 10)  Concerta 27 Mg  Tbcr (Methylphenidate Hcl) .Marland Kitchen.. 1 By Mouth Once Daily 11)  Lunesta 3 Mg Tabs (Eszopiclone) .... Take 1 Tablet By Mouth At Bedtime 12)  Vivactil 10 Mg  Tabs (Protriptyline Hcl) .Marland Kitchen.. 1 At Night 13)  Cpap  Advanced 14)  Nasonex 50 Mcg/act Susp (Mometasone Furoate) .Marland Kitchen.. 1-2 Sprays in Each Nostril Daily 15)  Lac-Hydrin 12 % Lotn (Ammonium  Lactate) .... Uad As Needed 16)  Uroxatral 10 Mg Xr24h-Tab (Alfuzosin Hcl) .Marland Kitchen.. 1 After Meals 17)  Vicodin 5-500 Mg Tabs (Hydrocodone-Acetaminophen)  Allergies (verified): No Known Drug Allergies  Past History:  Past medical, surgical, family and social histories (including risk factors) reviewed for relevance to current acute and chronic problems.  Past Medical History: Reviewed history from 03/14/2009 and no changes required. Asthma hx rhinosinusitis Depression Hypertension sleep apnea arthritis knees shoulder  spur  Past Surgical History: Sinus surgery ventral hernia repair 12-08 Rerepair ventral hernia  winter 2010 Rt Shoulder arthroscopic surgery  Family History: Reviewed history from 07/28/2008 and no changes required. Anthon mv replacement Fa Family History of Asthma Family History Hypertension DM MGM Mother- alive 70 Father- died age 45; heart failure Sister- alive age 27 good health  Brother- alive age 54 good health Brother- alive age 95 good health    Social History: Reviewed history from 07/28/2008 and no changes required. 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  The patient denies weight loss, weight gain, vision loss, decreased hearing, chest pain, syncope, dyspnea on exertion, peripheral edema, prolonged cough, headaches, abdominal pain, melena, hematochezia, severe indigestion/heartburn, unusual weight change, abnormal bleeding, enlarged lymph nodes, and angioedema.    Physical Exam  General:  Well-developed,well-nourished,in no acute distress; alert,appropriate and cooperative throughout examination very congested and tired and hoarse Head:  normocephalic and atraumatic.   Eyes:  vision grossly intact.   Ears:  R ear normal, L ear normal, and no external deformities.   Nose:  no external erythema and external deformity.   congested  face minor tenderenss Mouth:  pharynx pink and moist.   Neck:  No deformities, masses, or tenderness noted. Lungs:  Normal respiratory effort, chest expands symmetrically. Lungs are clear to auscultation, no crackles or wheezes.no dullness.   Heart:  Normal rate and regular rhythm. S1 and S2 normal without gallop, murmur, click, rub or other extra sounds.no lifts.   Pulses:  nl cap refill  Extremities:  no clubbing cyanosis or edema  Neurologic:  alert & oriented X3 and gait normal.   Skin:  turgor normal, color normal, no ecchymoses, no petechiae, and no purpura.   Cervical Nodes:  No lymphadenopathy noted Psych:  Oriented X3, not anxious appearing, and not depressed appearing.     Impression & Recommendations:  Problem # 1:  INFLUENZA LIKE ILLNESS (ICD-487.1) seems like low grade flu  had immuniz . No signs of pneumonia or other complication at present though he is high risk.  has abnormal sinus abnormaliti and risk of inection.  Problem # 2:  ASTHMA (ICD-493.90) seems stable so far     counseled about plan  His updated medication list for this problem includes:    Albuterol 90 Mcg/act Aers (Albuterol) ..... Use 2 sprays every four to six hours as needed    Accolate 20 Mg Tabs (Zafirlukast) .Marland Kitchen... 1 by mouth two times a day    Advair Diskus 100-50 Mcg/dose Misc (Fluticasone-salmeterol) .Marland Kitchen... 1 puff two times a day    Combivent 103-18 Mcg/act Aero (Ipratropium-albuterol) .Marland Kitchen... 2 puffs up to four times a day as needed  Complete Medication List: 1)  Albuterol 90 Mcg/act Aers (Albuterol) .... Use 2 sprays every four to six hours as needed 2)  Accolate 20 Mg Tabs (Zafirlukast) .Marland Kitchen.. 1 by mouth two times a day 3)  Advair Diskus 100-50 Mcg/dose Misc (Fluticasone-salmeterol) .Marland Kitchen.. 1 puff two times a day 4)  Combivent 103-18 Mcg/act Aero (Ipratropium-albuterol) .... 2 puffs up to four times a day as needed 5)  Amlodipine Besy-benazepril Hcl 5-10 Mg Caps (Amlodipine besy-benazepril  hcl) .Marland Kitchen.. 1 by mouth once daily 6)  Astelin 137 Mcg/spray Soln (Azelastine hcl) .... Use as directed 7)  Fexofenadine Hcl 180 Mg Tabs (Fexofenadine hcl) .Marland Kitchen.. 1 by mouth once daily 8)  Ibuprofen 800 Mg Tabs (Ibuprofen) .Marland Kitchen.. 1 by mouth q 8 hours as needed 9)  Lexapro 20 Mg Tabs (Escitalopram oxalate) .Marland Kitchen.. 1 1/2 by mouth once daily 10)  Concerta 27 Mg Tbcr (Methylphenidate hcl) .Marland Kitchen.. 1 by mouth once daily 11)  Lunesta 3 Mg Tabs (Eszopiclone) .... Take 1 tablet by mouth at bedtime 12)  Vivactil 10 Mg Tabs (Protriptyline hcl) .Marland Kitchen.. 1 at night 13)  Cpap Advanced  14)  Nasonex 50 Mcg/act Susp (Mometasone furoate) .Marland Kitchen.. 1-2 sprays in each nostril daily 15)  Lac-hydrin 12 % Lotn (Ammonium lactate) .... Uad as needed 16)  Uroxatral 10 Mg Xr24h-tab (Alfuzosin hcl) .Marland Kitchen.. 1 after meals 17)  Vicodin 5-500 Mg Tabs (Hydrocodone-acetaminophen)  Patient Instructions: 1)  i think this is viral and your pulse ox is 98. 2)  continue with the ibuprofen and call thursday am if not improving.  3)  call if you have a fever over 100.   4)  or if getting a sinus infection .   5)  expect improvement within the next 2 days or so.

## 2010-04-30 NOTE — Progress Notes (Signed)
Summary: refill on lunesta 3mg   Phone Note From Pharmacy   [facility]  [address] [phone]* Reason for Call: Needs renewal Details for Reason: Lunesta 3mg  Summary of Call: last filled on 03/05/09 #30 Initial call taken by: Romualdo Bolk, CMA (AAMA),  April 09, 2009 1:04 PM  Follow-up for Phone Call        Per Dr. [name] ok x 3  Rx sent back via fax Follow-up by: Romualdo Bolk, CMA (AAMA),  April 09, 2009 1:40 PM    Prescriptions: LUNESTA 3 MG TABS (ESZOPICLONE) Take 1 tablet by mouth at bedtime  #30 x 2   Entered by:   Romualdo Bolk, CMA (AAMA)   Authorized by:   Madelin Headings MD   Signed by:   Romualdo Bolk, CMA (AAMA) on 04/09/2009   Method used:   Handwritten   RxID:   [id number]

## 2010-04-30 NOTE — Assessment & Plan Note (Signed)
Summary: rov/ mbw   Primary Provider/Referring Provider:  Berniece Andreas  CC:  follow up visit-sleep-trouble since having knee surgery in November; no problems with breathing;"under control"..  History of Present Illness: 10/13/08- OSA, hypersomnia/ chonic fatigue, asthma Sleep is "normal for him". Needs new CPAP filters, may need new mask. CPAP definitely works. Lunesta works fine- definitiely aware of the after taste. has a prior auth to allow daily use instead of 2 weeks/ month per insurance. May eventually need TKR. Had Ventral hernia surgery without respiratory problems. Denies cough, wheeze, chest pain, palpitation.   July 10, 2009- OSA, hypersomnia, Chronic fatigue, asthma Had Right shoulder surgery - residual discomfort interferes with sleep. Compliant with CPAP. Recent new mask. Uncertain pressure. Old machine due for repalcement. Continues Concerta and Vivactil, with Lunesta as needed. No respiratory probs with anesthesia. Asthma has been well controlled.  March 01, 2010- OSA, hypersomnia, Chronic fatigue, asthma Nurse-CC: follow up visit-sleep-trouble since having knee surgery in November; no problems with breathing;"under control". Asthma feels "amazingly well controlled". He tolerated TKR w/ spinal block w/o repiratory complication.  Sleep issues- Since his surgery he is not sleeping as comfortably due to knee discomfort. Lunesta 3 mg does help. He continues Concerta for daytime hypersomnia and feels he functions less well at work if he misses it.  CPAP is used all night every night with improved daytime somnolence and good control of snoring.    Asthma History    Initial Asthma Severity Rating:    Age range: 12+ years    Symptoms: 0-2 days/week    Nighttime Awakenings: 0-2/month    Interferes w/ normal activity: no limitations    SABA use (not for EIB): 0-2 days/week    Asthma Severity Assessment: Intermittent   Preventive Screening-Counseling &  Management  Alcohol-Tobacco     Alcohol drinks/day: 0     Smoking Status: quit     Year Quit: 1992  Current Medications (verified): 1)  Accolate 20 Mg Tabs (Zafirlukast) .Marland Kitchen.. 1 By Mouth Two Times A Day 2)  Advair Diskus 100-50 Mcg/dose Misc (Fluticasone-Salmeterol) .Marland Kitchen.. 1 Puff Two Times A Day 3)  Combivent 103-18 Mcg/act  Aero (Ipratropium-Albuterol) .... 2 Puffs Up To Four Times A Day As Needed 4)  Amlodipine Besy-Benazepril Hcl 5-10 Mg Caps (Amlodipine Besy-Benazepril Hcl) .Marland Kitchen.. 1 By Mouth Once Daily 5)  Astelin 137 Mcg/spray Soln (Azelastine Hcl) .... Use As Directed 6)  Fexofenadine Hcl 180 Mg Tabs (Fexofenadine Hcl) .Marland Kitchen.. 1 By Mouth Once Daily 7)  Ibuprofen 800 Mg Tabs (Ibuprofen) .Marland Kitchen.. 1 By Mouth Q 8 Hours As Needed 8)  Lexapro 20 Mg Tabs (Escitalopram Oxalate) .Marland Kitchen.. 1 1/2 By Mouth Once Daily 9)  Concerta 27 Mg  Tbcr (Methylphenidate Hcl) .Marland Kitchen.. 1 By Mouth Once Daily 10)  Lunesta 3 Mg Tabs (Eszopiclone) .... Take 1 Tablet By Mouth At Bedtime 11)  Cpap 15   Advanced 12)  Nasonex 50 Mcg/act Susp (Mometasone Furoate) .Marland Kitchen.. 1-2 Sprays in Each Nostril Daily 13)  Lac-Hydrin 12 % Lotn (Ammonium Lactate) .... Uad As Needed 14)  Uroxatral 10 Mg Xr24h-Tab (Alfuzosin Hcl) .Marland Kitchen.. 1 After Meals 15)  Vicodin 5-500 Mg Tabs (Hydrocodone-Acetaminophen) 16)  Replacement Cpap Machine .... Current Pressure Humidifier 17)  Protriptyline Hcl 10 Mg Tabs (Protriptyline Hcl) .... Take 1 By Mouth At Bedtime  Allergies (verified): No Known Drug Allergies  Past History:  Past Medical History: Last updated: 09/17/2009 Asthma hx rhinosinusitis Depression Hypertension  nl stress cardiolite in 1998 sleep apnea arthritis knees shoulder  spur  Elevated PSA   [place] and bx negative 2011   Hosp: pneumonia right 2005.  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     Single-no children  no pets  Quit drinking ETOH in 1986   Risk Factors: Alcohol Use: 0 (03/01/2010) Caffeine Use: 6 (01/28/2010) Exercise: no (01/28/2010)  Risk Factors: Smoking Status: quit (03/01/2010)  Past Surgical History: Sinus surgery  facial reconstruction surgery  ventral hernia repair 12-08 Rerepair ventral hernia  winter 2010 Rt Shoulder arthroscopic surgery 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    Vital Signs:  Patient profile:   60 year old male Height:      65 inches Weight:      217.13 pounds BMI:     36.26 O2 Sat:      100 % on Room air Pulse rate:   99 / minute BP sitting:   136 / 82  (left arm) Cuff size:   regular  Vitals Entered By: Reynaldo Minium CMA (March 01, 2010 10:04 AM)  O2 Flow:  Room air CC: follow up visit-sleep-trouble since having knee surgery in November; no problems with breathing;"under control".   Physical Exam  Additional Exam:  General: A/Ox3; pleasant and cooperative, NAD, alert, not yawning, overweight SKIN: no rash, lesions NODES: no lymphadenopathy HEENT: /AT, EOM- WNL, Conjuctivae- clear, PERRLA, TM-WNL, Nose- clear- sounds stuffy, Throat- clear and wnl, Mallampati II NECK: Supple w/ fair ROM, JVD- none, normal carotid impulses w/o bruits Thyroid- CHEST:  Clear to P&A, unlabored, trace bibasiar expiratory wheeze HEART: RRR, no m/g/r heard ABDOMEN: obese ZOX:WRUE, nl pulses,1+ edema left  NEURO: Grossly intact to observation      Impression & Recommendations:  Problem # 1:  SLEEP APNEA, OBSTRUCTIVE (ICD-327.23)  Remains fully compliant with CPAP which definitely helps.   Problem # 2:  SLEEP DISORDER (ICD-780.50)  Residual daytime hypersomnia despite control of sleep apnea. Concerta has been helpful and well tolerated.   Problem # 3:  ASTHMA (ICD-493.90) Good control.  I cautioned him about control of leg edema after his leg surgery, suggesting elastic hose. I don't think he has phlebitis or fluid overload at this time, but briefly discussed related problems that he might interpret as asthma, to watch for.   Other Orders: Est. Patient Level IV (45409)  Patient Instructions: 1)  Please schedule a follow-up appointment in 6 months. 2)  Script for Concerta refill 3)  Consider trying elastic knee high socks for leg swelling.  Prescriptions: CONCERTA 27 MG  TBCR (METHYLPHENIDATE HCL) 1 by mouth once daily  #30 x 0   Entered and Authorized by:   Waymon Budge MD   Signed by:   Waymon Budge MD on 03/01/2010   Method used:   Print  then Give to Patient   RxID:   [id number]

## 2010-04-30 NOTE — Progress Notes (Signed)
Summary: refill on lunesta  Phone Note From Pharmacy   Caller: [facility]  [address] [phone]* Reason for Call: Needs renewal Details for Reason: Lunesta 3mg  Summary of Call: last filled on 09/10/09 #30 Initial call taken by: Romualdo Bolk, CMA (AAMA),  October 15, 2009 5:12 PM  Follow-up for Phone Call        refill  30 x 5  Follow-up by: Madelin Headings MD,  October 15, 2009 5:20 PM  Additional Follow-up for Phone Call Additional follow up Details #1::        Rx faxed to pharmacy Additional Follow-up by: Romualdo Bolk, CMA (AAMA),  October 16, 2009 8:21 AM    Prescriptions: LUNESTA 3 MG TABS (ESZOPICLONE) Take 1 tablet by mouth at bedtime  #30 x 5   Entered by:   Romualdo Bolk, CMA (AAMA)   Authorized by:   Madelin Headings MD   Signed by:   Romualdo Bolk, CMA (AAMA) on 10/16/2009   Method used:   Handwritten   RxID:   [id number]

## 2010-04-30 NOTE — Progress Notes (Signed)
Summary: CPAP is at 15 now  Phone Note Other Incoming   Summary of Call: CPAP is at 15 Advanced    New/Updated Medications: * CPAP 15   ADVANCED

## 2010-04-30 NOTE — Progress Notes (Signed)
Summary: rx req  Phone Note Call from Patient   Caller: Patient Call For: nadel Summary of Call: pt req rx be mailed to his home for CONCERTA. cell [id number] Initial call taken by: Tivis Ringer, CNA,  September 26, 2009 9:24 AM  Follow-up for Phone Call        Rx on SN's cart to be signed. Vernie Murders  September 26, 2009 9:38 AM   Additional Follow-up for Phone Call Additional follow up Details #1::        Done Additional Follow-up by: Waymon Budge MD,  September 27, 2009 12:23 PM    Prescriptions: CONCERTA 27 MG  TBCR (METHYLPHENIDATE HCL) 1 by mouth once daily  #30 x 0   Entered by:   Randell Loop CMA   Authorized by:   Waymon Budge MD   Signed by:   Randell Loop CMA on 09/26/2009   Method used:   Print then Give to Patient   RxID:   [id number] CONCERTA 27 MG  TBCR (METHYLPHENIDATE HCL) 1 by mouth once daily  #30 x 0   Entered by:   Vernie Murders   Authorized by:   Michele Mcalpine MD   Signed by:   Vernie Murders on 09/26/2009   Method used:   Print then Give to Patient   RxID:   [id number]  rx was reprinted for Cy---this is not a pt of SN---placed rx on CY cart to be signed for pt. Randell Loop CMA  September 26, 2009 11:34 AM

## 2010-04-30 NOTE — Progress Notes (Signed)
Summary: rx concerta  Phone Note Call from Patient Call back at Home Phone [phone]   Caller: Patient Call For: young Reason for Call: Refill Medication Summary of Call: Patient needing written rx for concerta 27mg .  Patient asking for [place] to mail to home address. Initial call taken by: Lehman Prom,  Aug 14, 2009 12:14 PM  Follow-up for Phone Call        Will give to Dr Maple Hudson to sign so we can mail to pt.  The first rx was in error because I put SNs name so I threw that one away, thanks Vernie Murders  Aug 14, 2009 12:38 PM   rx signed by Zack Seal and mailed to pt's home address.  Aundra Millet Reynolds LPN  Aug 14, 2009 1:37 PM     Prescriptions: CONCERTA 27 MG  TBCR (METHYLPHENIDATE HCL) 1 by mouth once daily  #30 x 0   Entered by:   Vernie Murders   Authorized by:   Waymon Budge MD   Signed by:   Vernie Murders on 08/14/2009   Method used:   Print then Give to Patient   RxID:   [id number] CONCERTA 27 MG  TBCR (METHYLPHENIDATE HCL) 1 by mouth once daily  #30 x 0   Entered by:   Vernie Murders   Authorized by:   Michele Mcalpine MD   Signed by:   Vernie Murders on 08/14/2009   Method used:   Print then Give to Patient   RxID:   [id number]

## 2010-04-30 NOTE — Medication Information (Signed)
Summary: Alfonso Patten Approved  Lunesta Approved   Imported By: Maryln Gottron 10/22/2009 12:38:06  _____________________________________________________________________  External Attachment:    Type:   Image     Comment:   External Document

## 2010-04-30 NOTE — Progress Notes (Signed)
Summary: rx request  Phone Note Call from Patient   Caller: Patient Call For: Chasitie Passey Summary of Call: pt requests rx for concerta 27mg . mail to pt's home (i have verified address). pt # [id number] Initial call taken by: Tivis Ringer, CNA,  July 02, 2009 3:43 PM  Follow-up for Phone Call        will forward to CY to address.  Aundra Millet Reynolds LPN  July 02, 4008 3:48 PM     Prescriptions: CONCERTA 27 MG  TBCR (METHYLPHENIDATE HCL) 1 by mouth once daily  #30 x 0   Entered by:   Waymon Budge MD   Authorized by:   Pulmonary Triage   Signed by:   Waymon Budge MD on 07/02/2009   Method used:   Print then Mail to Patient   RxID:   [id number]   Appended Document: rx request called and spoke with pt.  pt aware rx mailed to his home address.

## 2010-04-30 NOTE — Letter (Signed)
Summary: [facility]  [facility]   Imported By: Maryln Gottron 11/06/2009 14:56:02  _____________________________________________________________________  External Attachment:    Type:   Image     Comment:   External Document

## 2010-04-30 NOTE — Progress Notes (Signed)
Summary: refill on lunesta  Phone Note From Pharmacy   [facility]  [address] [phone]* Reason for Call: Needs renewal Details for Reason: Lunesta 3mg  Summary of Call: Last filled on 06-11-09 #30 Initial call taken by: Romualdo Bolk, CMA (AAMA),  July 09, 2009 1:15 PM  Follow-up for Phone Call        ok to refill  x 3  months Follow-up by: Madelin Headings MD,  July 09, 2009 5:14 PM  Additional Follow-up for Phone Call Additional follow up Details #1::        Rx faxed to pharmacy. Additional Follow-up by: Romualdo Bolk, CMA (AAMA),  July 09, 2009 5:17 PM    Prescriptions: LUNESTA 3 MG TABS (ESZOPICLONE) Take 1 tablet by mouth at bedtime  #30 x 2   Entered by:   Romualdo Bolk, CMA (AAMA)   Authorized by:   Madelin Headings MD   Signed by:   Romualdo Bolk, CMA (AAMA) on 07/09/2009   Method used:   Handwritten   RxID:   [id number]

## 2010-04-30 NOTE — Assessment & Plan Note (Signed)
Summary: rov/pft/apc   Primary Provider/Referring Provider:  Berniece Andreas  CC:  follow up visit after PFT.Marland Kitchen  History of Present Illness: 10/13/08- LOV-6 mos [name] returns for follow-up of his sleep apnea and asthma.  He is persistently sleepy during the daytime despite management of sleep apnea, and use of protriptyline and Concerta  He sees no real change.  Some days he feels more alert and productive, but other days he just drags.  He wants to continue protriptyline as an alerting medication for daytime use.  He has tolerated it well.  He dislikes Proair HFA as an inhaler.  We discussed a trial of Combivent.  He continues to use Concerta 27 mg daily, and we discussed the controlled status of that medication.  Medications are listed and reviewed. He has had some complaint of dry mouth, and I discussed Biotene.  04/03/08-OSA, hypersomnia/ chronic fatigue, asthma Sleep stable. Using Lunesta 3 mg to help get 8 hrs sleep, and occasional nap.  The protriptylline and Concerta help, with morning caffeine. Asthma- more persistent wheeze, blames allergy, occasionally coughs out a mucus plug. Using rescue inhaler 2 x/ day.  10/13/08- OSA, hypersomnia/ chonic fatigue, asthma Sleep is "normal for him". Needs new CPAP filters, may need new mask. CPAP definitely works. Lunesta works fine- definitiely aware of the after taste. has a prior auth to allow daily use instead of 2 weeks/ month per insurance. May eventually need TKR. Had Ventral hernia surgery without respiratory problems. Denies cough, wheeze, chest pain, palpitation.   July 10, 2009- OSA, hypersomnia, Chronic fatigue, asthma Had Right shoulder surgery - residual discomfort interferes with sleep. Compliant with CPAP. Recent new mask. Uncertain pressure. Old machine due for repalcement. Continues Concerta and Vivactil, with Lunesta as needed. No respiratory probs with anesthesia. Asthma has been well controlled.   Current Medications  (verified): 1)  Accolate 20 Mg Tabs (Zafirlukast) .Marland Kitchen.. 1 By Mouth Two Times A Day 2)  Advair Diskus 100-50 Mcg/dose Misc (Fluticasone-Salmeterol) .Marland Kitchen.. 1 Puff Two Times A Day 3)  Combivent 103-18 Mcg/act  Aero (Ipratropium-Albuterol) .... 2 Puffs Up To Four Times A Day As Needed 4)  Amlodipine Besy-Benazepril Hcl 5-10 Mg Caps (Amlodipine Besy-Benazepril Hcl) .Marland Kitchen.. 1 By Mouth Once Daily 5)  Astelin 137 Mcg/spray Soln (Azelastine Hcl) .... Use As Directed 6)  Fexofenadine Hcl 180 Mg Tabs (Fexofenadine Hcl) .Marland Kitchen.. 1 By Mouth Once Daily 7)  Ibuprofen 800 Mg Tabs (Ibuprofen) .Marland Kitchen.. 1 By Mouth Q 8 Hours As Needed 8)  Lexapro 20 Mg Tabs (Escitalopram Oxalate) .Marland Kitchen.. 1 1/2 By Mouth Once Daily 9)  Concerta 27 Mg  Tbcr (Methylphenidate Hcl) .Marland Kitchen.. 1 By Mouth Once Daily 10)  Lunesta 3 Mg Tabs (Eszopiclone) .... Take 1 Tablet By Mouth At Bedtime 11)  Vivactil 10 Mg  Tabs (Protriptyline Hcl) .Marland Kitchen.. 1 At Night 12)  Cpap  Advanced 13)  Nasonex 50 Mcg/act Susp (Mometasone Furoate) .Marland Kitchen.. 1-2 Sprays in Each Nostril Daily 14)  Lac-Hydrin 12 % Lotn (Ammonium Lactate) .... Uad As Needed 15)  Uroxatral 10 Mg Xr24h-Tab (Alfuzosin Hcl) .Marland Kitchen.. 1 After Meals 16)  Vicodin 5-500 Mg Tabs (Hydrocodone-Acetaminophen)  Allergies (verified): No Known Drug Allergies  Past History:  Past Medical History: Last updated: 03/14/2009 Asthma hx rhinosinusitis Depression Hypertension sleep apnea arthritis knees shoulder  spur  Past Surgical History: Last updated: 05/15/2009 Sinus surgery ventral hernia repair 12-08 Rerepair ventral hernia  winter 2010 Rt Shoulder arthroscopic surgery  Family History: Last updated: 07/28/2008 [place] mv replacement Fa Family History of Asthma  Family History Hypertension DM MGM Mother- alive 26 Father- died age 21; heart failure Sister- alive age 50 good health  Brother- alive age 28 good health Brother- alive age 73 good health    Social History: Last updated: 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    Vital Signs:  Patient profile:   60 year old male Height:      64.5 inches Weight:      212 pounds BMI:     35.96 O2 Sat:      97 % on Room air Pulse rate:   89 / minute BP sitting:   110 / 62  (left arm) Cuff size:   regular  Vitals Entered By: Reynaldo Minium CMA (July 10, 2009 4:25 PM)  O2 Flow:  Room air  Physical Exam  Additional Exam:  General: A/Ox3; pleasant and cooperative, NAD, alert, not yawning, overweight SKIN: no rash, lesions NODES: no lymphadenopathy HEENT: [place]/AT, EOM- WNL, Conjuctivae- clear, PERRLA, TM-WNL, Nose- clear- sounds stuffy, Throat- clear and wnl, Mallampati II NECK: Supple w/ fair ROM, JVD- none, normal carotid impulses w/o bruits Thyroid- CHEST: Clear to P&A, unlabored, trace bibasiar expiratory wheeze HEART: RRR, no m/g/r heard ABDOMEN: Soft and nl;  VWU:JWJX, nl pulses, no edema  NEURO: Grossly intact to observation      Impression & Recommendations:  Problem # 1:  SLEEP APNEA, OBSTRUCTIVE (ICD-327.23)  Good control and compliance. He is very clear that his life is better with it.. We need to update his old machine and verify pressure setting.  Problem # 2:  ASTHMA (ICD-493.90) Asthma is much better controlled.  PFTs show mild obstruction with some airtrapping and hyperinflation. He continues Advair, accolate and combivent, with activity limited more by arthritis in his knees, than by his  breathing.  Medications Added to Medication List This Visit: 1)  Replacement Cpap Machine  .... Current pressure humidifier  Other Orders: Est. Patient Level III (91478) DME Referral (DME)  Patient Instructions: 1)  Please schedule a follow-up appointment in 6 months. 2)  Script to replace old CPAP machine 3)  Continue present meds. Call for refills as needed. Prescriptions: REPLACEMENT CPAP MACHINE Current pressure Humidifier  #1 x prn   Entered and Authorized by:   Waymon Budge MD   Signed by:   Waymon Budge MD on 07/10/2009   Method used:   Print then Give to Patient   RxID:   [id number]

## 2010-04-30 NOTE — Assessment & Plan Note (Signed)
Summary: CPX // RS   Vital Signs:  Patient profile:   60 year old male Height:      65 inches Weight:      215 pounds Pulse rate:   88 / minute BP sitting:   138 / 80  (left arm) Cuff size:   regular  Vitals Entered By: Romualdo Bolk, CMA (AAMA) (September 17, 2009 10:16 AM) CC: CPX   History of Present Illness: [name] comes in today  for preventive visit .  DJD  :  knees worse and may have to havve surgery ...   see x ray of  knees  Asthma stable BP: stable no co  OSA:   CPAP  lunesta     out of  it for a week   .   doing better  Mood.  about the same sees dr Linton Rump on a regular basis   No other new medical concerns .  Preventive Care Screening  Prior Values:    PSA:  3.77 (09/10/2009)    Colonoscopy:  Location:  [facility].   (02/14/2008)    Last Flu Shot:  Fluvax 3+ (03/14/2009)   Preventive Screening-Counseling & Management  Alcohol-Tobacco     Alcohol drinks/day: 0     Smoking Status: quit     Year Quit: 1992  Caffeine-Diet-Exercise     Caffeine use/day: 6     Does Patient Exercise: no  Hep-HIV-STD-Contraception     Dental Visit-last 6 months yes     Sun Exposure-Excessive: no  Safety-Violence-Falls     Seat Belt Use: yes     Firearms in the Home: no firearms in the home     Smoke Detectors: yes      Blood Transfusions:  yes and facial surgery  autologous.    Current Medications (verified): 1)  Accolate 20 Mg Tabs (Zafirlukast) .Marland Kitchen.. 1 By Mouth Two Times A Day 2)  Advair Diskus 100-50 Mcg/dose Misc (Fluticasone-Salmeterol) .Marland Kitchen.. 1 Puff Two Times A Day 3)  Combivent 103-18 Mcg/act  Aero (Ipratropium-Albuterol) .... 2 Puffs Up To Four Times A Day As Needed 4)  Amlodipine Besy-Benazepril Hcl 5-10 Mg Caps (Amlodipine Besy-Benazepril Hcl) .Marland Kitchen.. 1 By Mouth Once Daily 5)  Astelin 137 Mcg/spray Soln (Azelastine Hcl) .... Use As Directed 6)  Fexofenadine Hcl 180 Mg Tabs (Fexofenadine Hcl) .Marland Kitchen.. 1 By Mouth Once Daily 7)  Ibuprofen 800 Mg  Tabs (Ibuprofen) .Marland Kitchen.. 1 By Mouth Q 8 Hours As Needed 8)  Lexapro 20 Mg Tabs (Escitalopram Oxalate) .Marland Kitchen.. 1 1/2 By Mouth Once Daily 9)  Concerta 27 Mg  Tbcr (Methylphenidate Hcl) .Marland Kitchen.. 1 By Mouth Once Daily 10)  Lunesta 3 Mg Tabs (Eszopiclone) .... Take 1 Tablet By Mouth At Bedtime 11)  Vivactil 10 Mg  Tabs (Protriptyline Hcl) .Marland Kitchen.. 1 At Night 12)  Cpap 15   Advanced 13)  Nasonex 50 Mcg/act Susp (Mometasone Furoate) .Marland Kitchen.. 1-2 Sprays in Each Nostril Daily 14)  Lac-Hydrin 12 % Lotn (Ammonium Lactate) .... Uad As Needed 15)  Uroxatral 10 Mg Xr24h-Tab (Alfuzosin Hcl) .Marland Kitchen.. 1 After Meals 16)  Vicodin 5-500 Mg Tabs (Hydrocodone-Acetaminophen) 17)  Replacement Cpap Machine .... Current Pressure Humidifier  Allergies (verified): No Known Drug Allergies  Past History:  Past medical, surgical, family and social histories (including risk factors) reviewed, and no changes noted (except as noted below).  Past Medical History: Asthma hx rhinosinusitis Depression Hypertension  nl stress cardiolite in 1998 sleep apnea arthritis knees shoulder  spur Elevated PSA   [place]  and bx negative 2011   Hosp: pneumonia right 2005.  Past Surgical History: Sinus surgery  facial reconstruction surgery  ventral hernia repair 12-08 Rerepair ventral hernia  winter 2010 Rt Shoulder arthroscopic surgery  Past History:  Care Management: Pulmonary:Young Urology:Peterson Surgery:Tseui Gastroenterology:Patterson Psychologist:Talbert Orthopedics : Supple  Family History: Reviewed history from 07/28/2008 and no changes required. [place] mv replacement Fa Family History of Asthma Family History Hypertension DM MGM Mother- alive 46 Father- died age 19; heart failure Sister- alive age 69 good health  Brother- alive age 43 good health Brother- alive age 49 good health    Social History: Reviewed history from 07/28/2008 and no changes required. 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         remote hx of pat. ?   Physical Exam General Appearance: well developed, well nourished, no acute distress Eyes: conjunctiva and lids normal, PERRLA, EOMI,  WNL Ears, Nose, Mouth, Throat: TM clear, nares clear, oral exam WNL Neck: supple, no lymphadenopathy, no thyromegaly, no JVD Respiratory: clear to auscultation and percussion, respiratory effort normal  except rare wheeze Cardiovascular: regular rate and rhythm, S1-S2, no murmur, rub or gallop, no bruits, peripheral pulses normal and symmetric, no cyanosis, clubbing,  trc t edema and loss of hair mid calf to le Chest: no scars, masses, tenderness; no asymmetry, skin changes, nipple discharge, no gynecomastia   Gastrointestinal: soft, non-tender; no hepatosplenomegaly, masses; active bowel sounds all quadrants,  Genitourinary: per urology Lymphatic: no cervical, axillary or inguinal adenopathy Musculoskeletal: gait normal,  strength WNL, no joint swelling, effusions, discoloration, some atrophy of quads     medial knee pain   x ray of knees show bone on bone  medially  Skin: clear, good turgor, color WNL, no rashes, suspicious  lesions, or ulcerations Neurologic: normal mental status, normal reflexes, normal strength, sensation, and motion Psychiatric: alert; oriented to person, place and time Other Exam:  see labs  EKG RBBB sinus rhytym  84     Impression & Recommendations:  Problem # 1:  PREVENTIVE HEALTH CARE (ICD-V70.0)  Discussed nutrition,exercise,diet,healthy weight, vitamin D and calcium.    Orders: EKG w/ Interpretation (93000)  Problem # 2:  HYPERTENSION (ICD-401.9)  monitor  His updated medication list for this problem includes:    Amlodipine Besy-benazepril Hcl 5-10 Mg Caps (Amlodipine besy-benazepril hcl) .Marland Kitchen... 1 by mouth once daily  BP today: 138/80 Prior BP: 110/62 (07/10/2009)  Prior 10 Yr Risk Heart Disease: 27 % (03/14/2009)  Labs Reviewed: K+: 4.6 (09/10/2009) Creat: : 0.6 (09/10/2009)   Chol: 150 (09/10/2009)   HDL: 33.30 (09/10/2009)   LDL: 108 (09/10/2009)   TG: 43.0 (09/10/2009)  Orders: EKG w/ Interpretation (93000)  Problem # 3:  ASTHMA (ICD-493.90) slight wheeze today  His updated medication list for this problem includes:    Accolate 20 Mg Tabs (Zafirlukast) .Marland Kitchen... 1 by mouth two times a day    Advair Diskus 100-50 Mcg/dose Misc (Fluticasone-salmeterol) .Marland Kitchen... 1 puff two times a day    Combivent 103-18 Mcg/act Aero (Ipratropium-albuterol) .Marland Kitchen... 2 puffs up  to four times a day as needed  Problem # 4:  SLEEP APNEA, OBSTRUCTIVE (ICD-327.23) Assessment: Comment Only  Problem # 5:  DEPRESSION (ICD-311) Assessment: Unchanged in continued counseling Dr Linton Rump His updated medication list for this problem includes:    Lexapro 20 Mg Tabs (Escitalopram oxalate) .Marland Kitchen... 1 1/2 by mouth once daily    Vivactil 10 Mg Tabs (Protriptyline hcl) .Marland Kitchen... 1 at night  Problem # 6:  BUNDLE BRANCH BLOCK, RIGHT (ICD-426.4)  review of old ekgs no change and did have a stress test in the remote past that was normal  Orders: EKG w/ Interpretation (93000)  Problem # 7:  OBESITY (ICD-278.00) Assessment: Comment Only  Problem # 8:  PROSTATE SPECIFIC ANTIGEN, ELEVATED (ICD-790.93) has had bx  x 2   followed by  urology.   Complete Medication List: 1)  Accolate 20 Mg Tabs (Zafirlukast) .Marland Kitchen.. 1 by mouth two times a day 2)  Advair Diskus 100-50 Mcg/dose Misc (Fluticasone-salmeterol) .Marland Kitchen.. 1 puff two times a day 3)  Combivent 103-18 Mcg/act Aero (Ipratropium-albuterol)  .... 2 puffs up to four times a day as needed 4)  Amlodipine Besy-benazepril Hcl 5-10 Mg Caps (Amlodipine besy-benazepril hcl) .Marland Kitchen.. 1 by mouth once daily 5)  Astelin 137 Mcg/spray Soln (Azelastine hcl) .... Use as directed 6)  Fexofenadine Hcl 180 Mg Tabs (Fexofenadine hcl) .Marland Kitchen.. 1 by mouth once daily 7)  Ibuprofen 800 Mg Tabs (Ibuprofen) .Marland Kitchen.. 1 by mouth q 8 hours as needed 8)  Lexapro 20 Mg Tabs (Escitalopram oxalate) .Marland Kitchen.. 1 1/2 by mouth once daily 9)  Concerta 27 Mg Tbcr (Methylphenidate hcl) .Marland Kitchen.. 1 by mouth once daily 10)  Lunesta 3 Mg Tabs (Eszopiclone) .... Take 1 tablet by mouth at bedtime 11)  Vivactil 10 Mg Tabs (Protriptyline hcl) .Marland Kitchen.. 1 at night 12)  Cpap 15 Advanced  13)  Nasonex 50 Mcg/act Susp (Mometasone furoate) .Marland Kitchen.. 1-2 sprays in each nostril daily 14)  Lac-hydrin 12 % Lotn (Ammonium lactate) .... Uad as needed 15)  Uroxatral 10 Mg Xr24h-tab (Alfuzosin hcl) .Marland Kitchen.. 1 after meals 16)  Vicodin 5-500 Mg Tabs (Hydrocodone-acetaminophen) 17)  Replacement Cpap Machine  .... Current pressure humidifier  Other Orders: Tdap => 31yrs IM (09811) Pneumococcal Vaccine (91478) Admin 1st Vaccine (29562) Admin of Any Addtl Vaccine (13086)  Patient Instructions: 1)  consider water exercise  therapy   to get good muscle mass for your knees.    2)  Losing weight may help. your knee pain  , asthma  ,bad  cholesterol,  and hypertension.  and sleep apnea.   3)  Will review your old EKG but seems to be no change.  4)  ROV in 6 months or as needed.   Immunizations Administered:  Tetanus Vaccine:    Vaccine Type: Tdap    Site: right deltoid    Mfr: GlaxoSmithKline    Dose: 0.5 ml    Route: IM    Given by: Romualdo Bolk, CMA (AAMA)    Exp. Date: 06/23/2011    Lot #: [id number]  Pneumonia Vaccine:    Vaccine Type: Pneumovax    Site: left deltoid    Mfr: Merck    Dose: 0.5 ml    Route: IM    Given by: Romualdo Bolk, CMA (AAMA)    Exp. Date: 03/20/2011    Lot #:  [id number]  Preventive Care Screening  Prior Values:    PSA:  3.77 (09/10/2009)    Colonoscopy:  Location:  [facility].   (02/14/2008)  Last Flu Shot:  Fluvax 3+ (03/14/2009)

## 2010-04-30 NOTE — Progress Notes (Signed)
Summary: REFILL CONCERTA  Phone Note Call from Patient Call back at [phone]   Caller: Patient Call For: YOUNG Summary of Call: NEEDS RX FOR CONSERTA PT WANTS THIS MAILED TO HIS HOUSE Initial call taken by: Lacinda Axon,  November 05, 2009 12:20 PM  Follow-up for Phone Call        Rx in CDY's lookat to be signed then mailed to pt. Vernie Murders  November 05, 2009 1:08 PM   Additional Follow-up for Phone Call Additional follow up Details #1::        mailed.Reynaldo Minium CMA  November 05, 2009 3:42 PM     Prescriptions: CONCERTA 27 MG  TBCR (METHYLPHENIDATE HCL) 1 by mouth once daily  #30 x 0   Entered by:   Vernie Murders   Authorized by:   Waymon Budge MD   Signed by:   Vernie Murders on 11/05/2009   Method used:   Print then Give to Patient   RxID:   [id number]

## 2010-04-30 NOTE — Progress Notes (Signed)
Summary: flu like symtoms  Phone Note Call from Patient Call back at [facility] Phone [phone]   Caller: Patient Summary of Call: Flu like symptoms- coughing, no wheezing, drainage, chills and hot. This started late last week with fatigue. Pt to come in tomorrow for an appt. Initial call taken by: Romualdo Bolk, CMA (AAMA),  May 14, 2009 3:44 PM

## 2010-04-30 NOTE — Medication Information (Signed)
Summary: Coverage Approval for Lunesta  Coverage Approval for Lunesta   Imported By: Maryln Gottron 09/19/2009 15:28:31  _____________________________________________________________________  External Attachment:    Type:   Image     Comment:   External Document

## 2010-04-30 NOTE — Progress Notes (Signed)
Summary: prescript  Phone Note Call from Patient Call back at [phone]   Caller: Patient Call For: young Summary of Call: would like prescript for concertra mailed to him Initial call taken by: Rickard Patience,  February 13, 2010 11:32 AM  Follow-up for Phone Call        Pt last seen in 06-2009 and advised to f/u in 6 months. Pt has and appt set for 12-11. Pelase advise if ok to refill concerta 27mg . Last refill given on 12-24-09 fro #30.Carron Curie CMA  February 13, 2010 11:42 AM   Additional Follow-up for Phone Call Additional follow up Details #1::        Per CDY-ok to refill-RX has been printed and on CDY's cart to sign.Reynaldo Minium CMA  February 13, 2010 12:42 PM    mailed to patient as requested.Reynaldo Minium CMA  February 13, 2010 4:36 PM     Prescriptions: CONCERTA 27 MG  TBCR (METHYLPHENIDATE HCL) 1 by mouth once daily  #30 x 0   Entered by:   Reynaldo Minium CMA   Authorized by:   Waymon Budge MD   Signed by:   Reynaldo Minium CMA on 02/13/2010   Method used:   Print then Give to Patient   RxID:   [id number]

## 2010-05-02 NOTE — Progress Notes (Signed)
Summary: refill on lunesta  Phone Note From Pharmacy   Caller: [facility]  [address] [phone] Reason for Call: Needs renewal Details for Reason: Lunesta 3mg  Summary of Call: last filled on 03/29/10 #30 Initial call taken by: Romualdo Bolk, CMA Duncan Dull),  April 26, 2010 9:37 AM  Follow-up for Phone Call        Per Dr. [name]- x 6months Follow-up by: Romualdo Bolk, CMA Duncan Dull),  April 26, 2010 4:45 PM  Additional Follow-up for Phone Call Additional follow up Details #1::        Rx faxed to pharmacy Additional Follow-up by: Romualdo Bolk, CMA (AAMA),  April 26, 2010 5:03 PM    Prescriptions: LUNESTA 3 MG TABS (ESZOPICLONE) Take 1 tablet by mouth at bedtime  #30 x 5   Entered by:   Romualdo Bolk, CMA (AAMA)   Authorized by:   Madelin Headings MD   Signed by:   Romualdo Bolk, CMA (AAMA) on 04/26/2010   Method used:   Handwritten   RxID:   [id number]

## 2010-05-02 NOTE — Assessment & Plan Note (Signed)
Summary: 6 MTH ROV // RS   Vital Signs:  Patient profile:   59 year old male Weight:      217 pounds Pulse rate:   88 / minute BP sitting:   130 / 80  (left arm) Cuff size:   regular  Vitals Entered By: Romualdo Bolk, CMA (AAMA) (March 21, 2010 10:10 AM) CC: follow-up visit   History of Present Illness: [name] comes in today  for follow up of multiple medical problems  Since last  visit had his tkr left and 6 weeks post op doing well.  Dr Charlann Boxer.  taking ibu as needed not a lot of narcotic at this point.  knee is just   sore...  right knee hurts more now. Anemia : was on iron for a months after surgery.  no recent t check. No unusual bleeding. Asthma: stable Sleep and mood : steady no changes  HT:  stable readings no changes  Preventive Screening-Counseling & Management  Alcohol-Tobacco     Alcohol drinks/day: 0     Smoking Status: quit     Year Quit: 1992  Caffeine-Diet-Exercise     Caffeine use/day: 6     Does Patient Exercise: no  Current Medications (verified): 1)  Accolate 20 Mg Tabs (Zafirlukast) .Marland Kitchen.. 1 By Mouth Two Times A Day 2)  Advair Diskus 100-50 Mcg/dose Misc (Fluticasone-Salmeterol) .Marland Kitchen.. 1 Puff Two Times A Day 3)  Combivent 103-18 Mcg/act  Aero (Ipratropium-Albuterol) .... 2 Puffs Up To Four Times A Day As Needed 4)  Amlodipine Besy-Benazepril Hcl 5-10 Mg Caps (Amlodipine Besy-Benazepril Hcl) .Marland Kitchen.. 1 By Mouth Once Daily 5)  Astelin 137 Mcg/spray Soln (Azelastine Hcl) .... Use As Directed 6)  Fexofenadine Hcl 180 Mg Tabs (Fexofenadine Hcl) .Marland Kitchen.. 1 By Mouth Once Daily 7)  Ibuprofen 800 Mg Tabs (Ibuprofen) .Marland Kitchen.. 1 By Mouth Q 8 Hours As Needed 8)  Lexapro 20 Mg Tabs (Escitalopram Oxalate) .Marland Kitchen.. 1 1/2 By Mouth Once Daily 9)  Concerta 27 Mg  Tbcr (Methylphenidate Hcl) .Marland Kitchen.. 1 By Mouth Once Daily 10)  Lunesta 3 Mg Tabs (Eszopiclone) .... Take 1 Tablet By Mouth At Bedtime 11)  Cpap 15   Advanced 12)  Nasonex 50 Mcg/act Susp (Mometasone Furoate) .Marland Kitchen.. 1-2  Sprays in Each Nostril Daily 13)  Lac-Hydrin 12 % Lotn (Ammonium Lactate) .... Uad As Needed 14)  Uroxatral 10 Mg Xr24h-Tab (Alfuzosin Hcl) .Marland Kitchen.. 1 After Meals 15)  Vicodin 5-500 Mg Tabs (Hydrocodone-Acetaminophen) 16)  Replacement Cpap Machine .... Current Pressure Humidifier 17)  Protriptyline Hcl 10 Mg Tabs (Protriptyline Hcl) .... Take 1 By Mouth At Bedtime  Allergies (verified): No Known Drug Allergies  Past History:  Past medical, surgical, family and social histories (including risk factors) reviewed, and no changes noted (except as noted below).  Past Medical History: Reviewed history from 09/17/2009 and no changes required. Asthma hx rhinosinusitis Depression Hypertension  nl stress cardiolite in 1998 sleep apnea arthritis knees shoulder  spur Elevated PSA   [place] and bx negative 2011   Hosp: pneumonia right 2005.  Past Surgical History: Reviewed history from 03/01/2010 and no changes required. 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 [place] mv replacement Fa Family History of Asthma Family History Hypertension DM MGM  Mother- alive 50 Father- died age 68; heart failure Sister- alive age 35 good health  Brother- alive age 70 good health Brother- alive age 37 good health    Social History: Reviewed history from 01/28/2010 and no changes required. 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    Physical Exam  General:  Well-developed,well-nourished,in no acute distress; alert,appropriate and cooperative throughout examination Head:  normocephalic and atraumatic.   Eyes:  vision grossly intact.   Ears:  R ear normal and L ear normal.   Nose:  no nasal discharge.   Neck:  No deformities, masses, or tenderness noted. no bruits Lungs:  Normal respiratory effort, chest expands symmetrically. Lungs are clear to auscultation, no crackles or wheezes.no dullness.   rare wheeze left  good air movement Heart:  Normal rate and regular rhythm. S1 and S2 normal without gallop, murmur, click, rub or other extra sounds. Abdomen:  Bowel sounds positive,abdomen soft and non-tender without masses, organomegaly or   noted. Msk:  left knee well healed scar  no redness good rom  Pulses:  pulses intact without delay   Neurologic:  alert & oriented X3.  grassly non focal  Skin:  turgor normal, color normal, no petechiae, and no purpura.   Cervical Nodes:  No lymphadenopathy noted Psych:  Oriented X3, good eye contact, not anxious appearing, and not depressed appearing.     Impression & Recommendations:  Problem # 1:  HYPERTENSION (ICD-401.9) controlled  His updated medication list for this problem includes:    Amlodipine Besy-benazepril Hcl 5-10 Mg Caps (Amlodipine besy-benazepril hcl) .Marland Kitchen... 1 by mouth once daily  BP today: 130/80 Prior BP: 136/82 (03/01/2010)  Prior 10 Yr Risk Heart Disease: 27 % (03/14/2009)  Labs Reviewed: K+: 4.6 (09/10/2009) Creat: : 0.6 (09/10/2009)   Chol: 150 (09/10/2009)   HDL: 33.30 (09/10/2009)   LDL: 108 (09/10/2009)   TG: 43.0 (09/10/2009)  Problem # 2:  ANEMIA, MILD (ICD-285.9) Assessment: Improved  Orders: Hgb (85018) Fingerstick (16109)  Hgb: 14.1 (03/21/2010)   Hct: 38.4 (09/10/2009)   Platelets: 151.0 (09/10/2009) RBC: 3.91  (09/10/2009)   RDW: 13.4 (09/10/2009)   WBC: 6.9 (09/10/2009) MCV: 98.1 (09/10/2009)   MCHC: 34.8 (09/10/2009) Ferritin: 86.5 (11/02/2008) Iron: 47 (11/02/2008)   % Sat: 14.8 (11/02/2008) B12: 307 (11/02/2008)   Folate: 17.1 (11/02/2008)   TSH: 1.86 (09/10/2009)  Problem # 3:  DEPRESSION (ICD-311) Assessment: Unchanged stable His updated medication list for this problem includes:    Lexapro 20 Mg Tabs (Escitalopram oxalate) .Marland Kitchen... 1 1/2 by mouth once daily    Protriptyline Hcl 10 Mg Tabs (Protriptyline hcl) .Marland Kitchen... Take 1 by mouth at bedtime  Problem # 4:  ASTHMA (ICD-493.90) Assessment: Unchanged continue sees Dr young His updated medication list for this problem includes:    Accolate 20 Mg Tabs (Zafirlukast) .Marland Kitchen... 1 by mouth two times a day    Advair Diskus 100-50 Mcg/dose Misc (Fluticasone-salmeterol) .Marland Kitchen... 1 puff two times a day    Combivent 103-18 Mcg/act Aero (Ipratropium-albuterol) .Marland Kitchen... 2 puffs up to four times a day as needed  Problem # 5:  SLEEP DISORDER (ICD-780.50) Assessment: Unchanged  Problem # 6:  OBESITY (ICD-278.00) hopefullly can be more sucessful  in  weight loss after surgery for his knee arthritis Ht: 65 (03/01/2010)   Wt: 217 (03/21/2010)   BMI: 36.26 (03/01/2010)  Complete Medication List: 1)  Accolate 20 Mg Tabs (Zafirlukast) .Marland Kitchen.. 1 by mouth two times a day 2)  Advair Diskus 100-50 Mcg/dose Misc (Fluticasone-salmeterol) .Marland Kitchen.. 1 puff two times a day 3)  Combivent 103-18 Mcg/act Aero (Ipratropium-albuterol) .... 2 puffs up to four times a day as needed 4)  Amlodipine Besy-benazepril Hcl 5-10 Mg Caps (Amlodipine besy-benazepril hcl) .Marland Kitchen.. 1 by mouth once daily 5)  Astelin 137 Mcg/spray Soln (Azelastine hcl) .... Use as directed 6)  Fexofenadine Hcl 180 Mg Tabs (Fexofenadine hcl) .Marland Kitchen.. 1 by mouth once daily 7)  Ibuprofen 800 Mg Tabs (Ibuprofen) .Marland Kitchen.. 1 by mouth q 8 hours as needed 8)  Lexapro 20 Mg Tabs (Escitalopram oxalate) .Marland Kitchen.. 1 1/2 by mouth once daily 9)   Concerta 27 Mg Tbcr (Methylphenidate hcl) .Marland Kitchen.. 1 by mouth once daily 10)  Lunesta 3 Mg Tabs (Eszopiclone) .... Take 1 tablet by mouth at bedtime 11)  Cpap 15 Advanced  12)  Nasonex 50 Mcg/act Susp (Mometasone furoate) .Marland Kitchen.. 1-2 sprays in each nostril daily 13)  Lac-hydrin 12 % Lotn (Ammonium lactate) .... Uad as needed 14)  Uroxatral 10 Mg Xr24h-tab (Alfuzosin hcl) .Marland Kitchen.. 1 after meals 15)  Vicodin 5-500 Mg Tabs (Hydrocodone-acetaminophen) 16)  Replacement Cpap Machine  .... Current pressure humidifier 17)  Protriptyline Hcl 10 Mg Tabs (Protriptyline hcl) .... Take 1 by mouth at bedtime  Patient Instructions: 1)  wellness visit and   labs in 6 months  or as needed    Orders Added: 1)  Hgb [85018] 2)  Fingerstick [36416] 3)  Est. Patient Level IV [54098]    Laboratory Results   CBC   HGB:  14.1 g/dL   (Normal Range: 11.9-14.7 in Males, 12.0-15.0 in Females) Comments: Rita Ohara  March 21, 2010 11:23 AM

## 2010-05-08 NOTE — Progress Notes (Signed)
Summary: written prescription  Phone Note Call from Patient   Caller: Patient Call For: dr young Summary of Call: patient phoned and needs a written prescription for his generic Concerta 27 mg. he would like this mailed to his home address. He can be reached at [phone] Initial call taken by: Vedia Coffer,  April 30, 2010 10:21 AM  Follow-up for Phone Call        please mail to pt once signed  Philipp Deputy [facility]  April 30, 2010 10:58 AM   dr young signed rx , phone call to pt to let him know this will go out today i also verified the address--mailed Philipp Deputy CMA  April 30, 2010 11:03 AM  Follow-up by: Philipp Deputy CMA,  April 30, 2010 11:03 AM    Prescriptions: CONCERTA 27 MG  TBCR (METHYLPHENIDATE HCL) 1 by mouth once daily  #30 x 0   Entered by:   Philipp Deputy CMA   Authorized by:   Waymon Budge MD   Signed by:   Philipp Deputy CMA on 04/30/2010   Method used:   Print then Mail to Patient   RxID:   [id number]

## 2010-05-20 ENCOUNTER — Telehealth (INDEPENDENT_AMBULATORY_CARE_PROVIDER_SITE_OTHER): Payer: Self-pay | Admitting: *Deleted

## 2010-05-24 ENCOUNTER — Encounter: Payer: Self-pay | Admitting: Internal Medicine

## 2010-06-06 NOTE — Medication Information (Signed)
Summary: Methylphenidate/[facility]  Methylphenidate/[facility]   Imported By: Sherian Rein 05/30/2010 08:41:44  _____________________________________________________________________  External Attachment:    Type:   Image     Comment:   External Document

## 2010-06-11 LAB — BASIC METABOLIC PANEL
BUN: 12 mg/dL (ref 6–23)
BUN: 14 mg/dL (ref 6–23)
Calcium: 8.3 mg/dL — ABNORMAL LOW (ref 8.4–10.5)
Calcium: 8.7 mg/dL (ref 8.4–10.5)
Creatinine, Ser: 0.88 mg/dL (ref 0.4–1.5)
GFR calc Af Amer: >60 mL/min (ref >60)
GFR calc Af Amer: >60 mL/min (ref >60)
GFR calc non Af Amer: >60 mL/min (ref >60)
GFR calc non Af Amer: >60 mL/min (ref >60)
GFR calc non Af Amer: >60 mL/min (ref >60)
Glucose, Bld: 118 mg/dL — ABNORMAL HIGH (ref 70–99)
Glucose, Bld: 124 mg/dL — ABNORMAL HIGH (ref 70–99)
Potassium: 4.7 mEq/L (ref 3.5–5.1)
Sodium: 130 mEq/L — ABNORMAL LOW (ref 135–145)
Sodium: 137 mEq/L (ref 135–145)

## 2010-06-11 LAB — CBC
HCT: 35.6 % — ABNORMAL LOW (ref 39.0–52.0)
HCT: 40.9 % (ref 39.0–52.0)
Hemoglobin: 11.2 g/dL — ABNORMAL LOW (ref 13.0–17.0)
MCHC: 34.8 g/dL (ref 30.0–36.0)
MCHC: 35.1 g/dL (ref 30.0–36.0)
Platelets: 151 10*3/uL (ref 150–400)
RBC: 4.24 MIL/uL (ref 4.22–5.81)
RDW: 13.4 % (ref 11.5–15.5)
RDW: 13.6 % (ref 11.5–15.5)
WBC: 6.1 10*3/uL (ref 4.0–10.5)

## 2010-06-11 LAB — SURGICAL PCR SCREEN
MRSA, PCR: NEGATIVE
Staphylococcus aureus: NEGATIVE

## 2010-06-11 LAB — APTT: aPTT: 30 seconds (ref 24–37)

## 2010-06-11 LAB — TYPE AND SCREEN: Antibody Screen: NEGATIVE

## 2010-06-11 LAB — DIFFERENTIAL
Basophils Absolute: 0 10*3/uL (ref 0.0–0.1)
Lymphocytes Relative: 14 % (ref 12–46)
Monocytes Absolute: 0.9 10*3/uL (ref 0.1–1.0)
Neutro Abs: 3.9 10*3/uL (ref 1.7–7.7)

## 2010-06-11 LAB — URINALYSIS, ROUTINE W REFLEX MICROSCOPIC
Bilirubin Urine: NEGATIVE
Hgb urine dipstick: NEGATIVE
Urobilinogen, UA: 0.2 mg/dL (ref 0.0–1.0)
pH: 7 (ref 5.0–8.0)

## 2010-06-11 LAB — PROTIME-INR: Prothrombin Time: 13.5 seconds (ref 11.6–15.2)

## 2010-06-11 NOTE — Progress Notes (Signed)
Summary: Concerta PA --- APPROVED from 05/03/2010 to 05/24/2011  Phone Note Call from Patient Call back at Home Phone [phone]   Caller: Patient Call For: young Summary of Call: pt has been out of CONCERTA for 2 wks. [facility] [address] has told them that they have faxed "several faxes over the last 2 wks re: prior auth for this. pt wants to get this asap. pt cell # [phone] Initial call taken by: Tivis Ringer, CNA,  May 20, 2010 1:40 PM  Follow-up for Phone Call        Spoke [facility] at [facility] and they had the wrong fax number on file for our office. Pharmacist given correct fax number and will resend PA request. AWAITING FAX.Michel Bickers CMA  May 20, 2010 4:10 PM  [facility]ed [facility] because [facility] form had not been received at our office on Concerta. Pharmacy asked to refax form to [phone].Michel Bickers [facility]  May 21, 2010 11:41 AM  Additional Follow-up for Phone Call Additional follow up Details #1::        Fax for PA recieved from [facility].  Called to initiate PA. Awaiting fax now to be sent to [phone].  Case ID # [id number] Vernie Murders  May 21, 2010 11:59 AM     Additional Follow-up for Phone Call Additional follow up Details #2::    Katie, I have checked the fax up front x 3 since speaking with [employer] and have not seen this form. Just wondered if it was brought to you or [facility] by any chance.  Pls advise thanks! Vernie Murders  May 21, 2010 4:09 PM     Papers have been filled out by [facility] and faxed back to [facility]; papers are in [place] waiting box in triage.Reynaldo Minium CMA  May 24, 2010 8:55 AM   Additional Follow-up for Phone Call Additional follow up Details #3:: Details for Additional Follow-up Action Taken: Concerta APPROVED from 05/03/2010 to 05/24/2011.  Patient and pharmacy notified.Michel Bickers CMA  June 04, 2010 11:35 AM

## 2010-06-20 ENCOUNTER — Ambulatory Visit (HOSPITAL_COMMUNITY)
Admission: RE | Admit: 2010-06-20 | Discharge: 2010-06-20 | Disposition: A | Discharge status: Home / Self Care | Payer: BC Managed Care – PPO | Source: Ambulatory Visit | Attending: Orthopedic Surgery | Admitting: Orthopedic Surgery

## 2010-06-20 ENCOUNTER — Encounter (HOSPITAL_COMMUNITY): Payer: BC Managed Care – PPO

## 2010-06-20 ENCOUNTER — Other Ambulatory Visit: Payer: Self-pay | Admitting: Orthopedic Surgery

## 2010-06-20 ENCOUNTER — Other Ambulatory Visit (HOSPITAL_COMMUNITY): Payer: Self-pay | Admitting: Orthopedic Surgery

## 2010-06-20 DIAGNOSIS — Z01812 Encounter for preprocedural laboratory examination: Secondary | ICD-10-CM | POA: Insufficient documentation

## 2010-06-20 DIAGNOSIS — I10 Essential (primary) hypertension: Secondary | ICD-10-CM | POA: Insufficient documentation

## 2010-06-20 LAB — DIFFERENTIAL
Basophils Absolute: 0 10*3/uL (ref 0.0–0.1)
Basophils Relative: 0 % (ref 0–1)
Lymphocytes Relative: 17 % (ref 12–46)
Monocytes Absolute: 1.2 10*3/uL — ABNORMAL HIGH (ref 0.1–1.0)
Monocytes Relative: 14 % — ABNORMAL HIGH (ref 3–12)
Neutro Abs: 4.6 10*3/uL (ref 1.7–7.7)
Neutrophils Relative %: 58 % (ref 43–77)

## 2010-06-20 LAB — CBC
HCT: 41.5 % (ref 39.0–52.0)
Hemoglobin: 14 g/dL (ref 13.0–17.0)
MCHC: 33.7 g/dL (ref 30.0–36.0)
MCV: 98.7 fL (ref 78.0–100.0)
RBC: 4.22 MIL/uL (ref 4.22–5.81)
RBC: 4.44 MIL/uL (ref 4.22–5.81)
WBC: 7.3 10*3/uL (ref 4.0–10.5)

## 2010-06-20 LAB — URINALYSIS, ROUTINE W REFLEX MICROSCOPIC
Bilirubin Urine: NEGATIVE
Bilirubin Urine: NEGATIVE
Glucose, UA: NEGATIVE mg/dL
Hgb urine dipstick: NEGATIVE
Hgb urine dipstick: NEGATIVE
Ketones, ur: NEGATIVE mg/dL
Nitrite: NEGATIVE
Specific Gravity, Urine: 1.006 (ref 1.005–1.030)
Specific Gravity, Urine: 1.008 (ref 1.005–1.030)
pH: 7 (ref 5.0–8.0)
pH: 6.5 (ref 5.0–8.0)

## 2010-06-20 LAB — COMPREHENSIVE METABOLIC PANEL
ALT: 22 U/L (ref 0–53)
AST: 21 U/L (ref 0–37)
Alkaline Phosphatase: 50 U/L (ref 39–117)
CO2: 26 mEq/L (ref 19–32)
Chloride: 102 mEq/L (ref 96–112)
Creatinine, Ser: 0.75 mg/dL (ref 0.4–1.5)
GFR calc Af Amer: >60 mL/min (ref >60)
GFR calc non Af Amer: >60 mL/min (ref >60)
Potassium: 4.5 mEq/L (ref 3.5–5.1)
Sodium: 136 mEq/L (ref 135–145)
Total Bilirubin: 1 mg/dL (ref 0.3–1.2)

## 2010-06-20 LAB — BASIC METABOLIC PANEL
Calcium: 9.7 mg/dL (ref 8.4–10.5)
GFR calc Af Amer: >60 mL/min (ref >60)
GFR calc non Af Amer: >60 mL/min (ref >60)
Glucose, Bld: 88 mg/dL (ref 70–99)
Potassium: 4.7 mEq/L (ref 3.5–5.1)
Sodium: 139 mEq/L (ref 135–145)

## 2010-06-20 LAB — SURGICAL PCR SCREEN
MRSA, PCR: NEGATIVE
Staphylococcus aureus: NEGATIVE

## 2010-06-20 LAB — PROTIME-INR
INR: 0.96 (ref 0.00–1.49)
Prothrombin Time: 13 seconds (ref 11.6–15.2)

## 2010-06-23 NOTE — H&P (Signed)
NAME:  [name], [name] NO.:  [id number]  MEDICAL RECORD NO.:  [id number]           PATIENT TYPE:  I  LOCATION:  1S                           FACILITY:  [facility]  PHYSICIAN:  Madlyn Frankel. Charlann Boxer, M.D.  DATE OF BIRTH:  1951/02/04  DATE OF ADMISSION: DATE OF DISCHARGE:                             HISTORY & PHYSICAL   DATE OF SURGERY:  July 01, 2010.  ADMITTING DIAGNOSIS:  End-stage osteoarthritis, right knee.  BRIEF HISTORY:  This is a 60 year old gentleman with a history of osteoarthritis of both knees.  He is status post total knee arthroplasty on left with good results, who is now scheduled for total knee arthroplasty on the right.  Surgeries, benefits, and aftercare were discussed in detail with the patient questions invited and answered. Note, the patient is a candidate for tranexamic acid and he will get that in the preoperative holding area.  He is also given this postoperative prescriptions today to fill for use after surgery.  PAST MEDICAL HISTORY:  DRUG ALLERGIES:  None.  CURRENT MEDICATIONS: 1. Accolate 20 mg b.i.d. 2. Advair disk 1 or 2 puffs a day. 3. Combivent 2 puffs q.i.d. p.r.n. 4. Amlodipine 5/10 one nightly. 5. Astelin 137 mcg spray daily. 6. Fexofenadine 180 mg daily. 7. Lexapro 20 mg 1-1/2 tablets nightly. 8. Concerta 27 mg once a day. 9. Lunesta 3 mg nightly p.r.n. 10.Vivactil 10 mg nightly. 11.Nasonex 50 mg 1 or 2 drops in each nostril daily. 12.Uroxatral XR 10 mg q.24 h. 13.Lac-Hydrin cream p.r.n.  PREVIOUS SURGERIES:  Total knee arthroplasty, left and right shoulder arthroscopy, lap hernia, open hernia repair and maxillofacial surgery, LeFort type 3.  SERIOUS MEDICAL ILLNESSES:  Asthma, chronic bronchitis, hypertension, allergies, depression, and sleep apnea.  SOCIAL HISTORY:  The patient is single.  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  He does not drink since 1986.  No history of drug abuse.  FAMILY  HISTORY:  Positive for asthma.  REVIEW OF SYSTEMS:  CENTRAL NERVOUS SYSTEM:  Positive for anxiety and depression. PULMONARY:  Positive for history of asthma with occasional exertional shortness of breath and wheezing. CARDIOVASCULAR:  Negative for chest pain or palpitations. GI:  Negative for ulcers, hepatitis. GU:  Negative for urinary tract difficulties. MUSCULOSKELETAL:  Positive in HPI.  PHYSICAL EXAMINATION:  VITAL SIGNS:  BP 130/80, respirations 16, pulse 80 and irregular. GENERAL APPEARANCE:  This is a well-nourished gentleman in no acute distress. HEENT:  Head is normocephalic.  Nose patent.  Ears patent.  Pupils equal, round, and reactive to light.  Throat without injection.  NECK: Supple without adenopathy.  Carotids 2+ without bruit. CHEST:  Clear to auscultation.  Some wheezes at the right base, otherwise no rales or rhonchi. HEART:  Regular rate and rhythm at 80 beats per minute without murmur. ABDOMEN:  Soft.  Active bowel sounds.  No masses or organomegaly. NEUROLOGIC:  The patient is alert and oriented to time, place, and person.  Cranial nerves II through XII grossly intact.  EXTREMITIES: The left knee 0-135 degrees range of motion, status post total knee arthroplasty, the right  knee shows 0-125 degrees range of motion with crepitations throughout the range of motion.  ASSESSMENT:  Right knee osteoarthritis.  PLAN:  Right total knee arthroplasty.     Jaquelyn Bitter. Chabon, P.A.   ______________________________ Madlyn Frankel Charlann Boxer, M.D.    SJC/MEDQ  D:  06/19/2010  T:  06/20/2010  Job:  [id number]  Electronically Signed by Jodene Nam P.A. on 06/21/2010 08:55:59 AM Electronically Signed by [name] M.D. on 06/23/2010 12:10:34 PM

## 2010-07-01 ENCOUNTER — Inpatient Hospital Stay (HOSPITAL_COMMUNITY)
Admission: RE | Admit: 2010-07-01 | Discharge: 2010-07-03 | DRG: 209 | Disposition: A | Discharge status: Home Health | Payer: BC Managed Care – PPO | Source: Ambulatory Visit | Attending: Orthopedic Surgery | Admitting: Orthopedic Surgery

## 2010-07-01 DIAGNOSIS — F341 Dysthymic disorder: Secondary | ICD-10-CM | POA: Diagnosis present

## 2010-07-01 DIAGNOSIS — J449 Chronic obstructive pulmonary disease, unspecified: Secondary | ICD-10-CM | POA: Diagnosis present

## 2010-07-01 DIAGNOSIS — M1710 Unilateral primary osteoarthritis, unspecified knee: Principal | ICD-10-CM | POA: Diagnosis present

## 2010-07-01 DIAGNOSIS — G4733 Obstructive sleep apnea (adult) (pediatric): Secondary | ICD-10-CM | POA: Diagnosis present

## 2010-07-01 DIAGNOSIS — J4489 Other specified chronic obstructive pulmonary disease: Secondary | ICD-10-CM | POA: Diagnosis present

## 2010-07-01 DIAGNOSIS — I10 Essential (primary) hypertension: Secondary | ICD-10-CM | POA: Diagnosis present

## 2010-07-01 DIAGNOSIS — Z01812 Encounter for preprocedural laboratory examination: Secondary | ICD-10-CM

## 2010-07-01 DIAGNOSIS — Z96659 Presence of unspecified artificial knee joint: Secondary | ICD-10-CM

## 2010-07-01 LAB — TYPE AND SCREEN
ABO/RH(D): O POS
Antibody Screen: NEGATIVE

## 2010-07-01 NOTE — Op Note (Signed)
NAME:  [name], MACH NO.:  [id number]  MEDICAL RECORD NO.:  [id number]           PATIENT TYPE:  I  LOCATION:  0004                         FACILITY:  [facility]  PHYSICIAN:  Madlyn Frankel. Charlann Boxer, M.D.  DATE OF BIRTH:  [birth year]  DATE OF PROCEDURE:  07/01/2010 DATE OF DISCHARGE:                              OPERATIVE REPORT   PREOPERATIVE DIAGNOSIS:  Right knee osteoarthritis.  POSTOPERATIVE DIAGNOSIS:  Right knee osteoarthritis.  PROCEDURE:  Right total knee replacement.  COMPONENTS USED:  DePuy rotating platform posterior stabilized knee system, size 4 femur, 4 tibia, 12.5 insert and a 38 patellar button.  SURGEON:  Madlyn Frankel. Charlann Boxer, M.D.  ASSISTANT:  Jaquelyn Bitter. Chabon, P.A.  ANESTHESIA:  General with a failed attempt at spinal block anesthesia.  SPECIMENS:  None.  COMPLICATIONS:  None.  DRAINS:  One Hemovac.  TOURNIQUET TIME:  38 minutes at 250 mmHg.  BLOOD LOSS:  Minimal.  The patient was stable to recovery room.  INDICATIONS FOR PROCEDURE:  [name] is a 60 year old gentleman who is here for his right total knee replacement.  He has had a history of left total knee replacement that he has done exceptionally well from.  We reviewed the risks and benefits and the postoperative course and expectations in the office.  Consent was obtained for benefit of pain relief.  PROCEDURE IN DETAIL:  The patient was brought to operative theater. Once adequate anesthesia, preoperative antibiotics, Ancef administered 2 g, he was positioned supine with a right thigh tourniquet placed.  The right lower extremity was then prepped and draped in sterile fashion and the right leg placed in [facility] leg holder.  The right lower extremity was then exsanguinated.  Time-out was performed identifying the patient, planned procedure and extremity.  Leg was exsanguinated, tourniquet elevated to 250 mmHg.  Midline incision was made followed by median parapatellar arthrotomy.   Following initial exposure, attention was first directed to patella precut measurement was 22 to 23 mm.  I resected down to 14 mm and used a 38 patellar button to restore height. Lug holes were drilled and a metal shim placed to resect the patella from the retractors and saw blades.  Attention was now directed to femur.  Femoral canal was opened, drill irrigated to try to prevent fat emboli.  An intramedullary rod was passed at 3 degrees of valgus, I resected 10 mm bone off the distal femur.  Following this resection, attention was directed to tibia.  The tibia was subluxated anteriorly.  Using extramedullary guide in line with the tibial shaft, I resected 10 mm of bone based off the proximal and lateral tibia.  Following this resection, we confirmed the cut was perpendicular in coronal plane as well as assessed the extension gap was going to be stable medially and laterally.  Once this was done, we sized the femur to be a size 4.  The size 4 rotation block was pinned into position anterior reference using a C clamp off the proximal tibia to set rotation.  An anterior and posterior chamfer cuts were then made without difficulty, nor notching.  Final box cut  was made off the lateral aspect of distal femur.  At this point, the tibia was subluxated anteriorly.  This cut surfacing best fit with size 4, particularly after removing medial osteophytes. The tray was pinned into position, drilled with keel punch.  Trial reduction was carried out with 4 femur, 4 tibia, and 12.5 insert.  The knee was noted to come out to full extension.  The knee was stable from extension to flexion.  The patella was tracking through the trochlea without application pressure.  At this point, the trial components were removed.  The synovial capsule junction of the knee was injected with 0.25% Marcaine with epinephrine and 1 cc of Toradol.  Final components were opened, cement was mixed. The knee was irrigated  with normal saline solution and pulse lavage. Final components were then cemented on to clean and dried cut surfaces of bone.  The knee was brought to extension with the 12.5 insert and extruded cement was removed.  Tourniquet was let down after 38 minutes without significant hemostasis required.  Once the cement had cured, excessive cement was removed throughout the knee.  The final 12.5 insert was chosen and placed into the knee.  Medium Hemovac drain was placed deep.  The knee was reirrigated with normal saline solution.  At this point, the extensor mechanism was then reapproximated with the knee in flexion using #1 Vicryl.  The remaining wound was closed with 2-0 Vicryl running 4-0 Monocryl.  The knee was cleaned, dried and dressed sterilely with an Aquacel dressing.  The draining site dressed separately.  He was then brought to the recovery room extubated in stable condition tolerating the procedure well.     Madlyn Frankel Charlann Boxer, M.D.     MDO/MEDQ  D:  07/01/2010  T:  07/01/2010  Job:  [id number]  Electronically Signed by [name] M.D. on 07/01/2010 12:14:54 PM

## 2010-07-02 LAB — BASIC METABOLIC PANEL
BUN: 13 mg/dL (ref 6–23)
Calcium: 8.5 mg/dL (ref 8.4–10.5)
Chloride: 104 mEq/L (ref 96–112)
Creatinine, Ser: 0.93 mg/dL (ref 0.4–1.5)
GFR calc Af Amer: >60 mL/min (ref >60)
GFR calc non Af Amer: >60 mL/min (ref >60)

## 2010-07-02 LAB — CBC
MCH: 31.3 pg (ref 26.0–34.0)
MCHC: 33.2 g/dL (ref 30.0–36.0)
MCV: 94.5 fL (ref 78.0–100.0)
Platelets: 153 10*3/uL (ref 150–400)
RDW: 13.8 % (ref 11.5–15.5)

## 2010-07-03 ENCOUNTER — Telehealth: Payer: Self-pay | Admitting: Internal Medicine

## 2010-07-03 LAB — BASIC METABOLIC PANEL
BUN: 10 mg/dL (ref 6–23)
Calcium: 8.4 mg/dL (ref 8.4–10.5)
Creatinine, Ser: 0.68 mg/dL (ref 0.4–1.5)
GFR calc Af Amer: >60 mL/min (ref >60)
Potassium: 4.2 mEq/L (ref 3.5–5.1)

## 2010-07-03 LAB — CBC
MCV: 94.3 fL (ref 78.0–100.0)
Platelets: 123 10*3/uL — ABNORMAL LOW (ref 150–400)
RBC: 3.5 MIL/uL — ABNORMAL LOW (ref 4.22–5.81)
RDW: 13.9 % (ref 11.5–15.5)
WBC: 10.5 10*3/uL (ref 4.0–10.5)

## 2010-07-03 MED ORDER — METHYLPHENIDATE HCL ER (OSM) 27 MG PO TBCR: 27.0000 mg | EXTENDED_RELEASE_TABLET | Freq: Every day | ORAL | Status: DC

## 2010-07-03 NOTE — Telephone Encounter (Signed)
Printed off rx and was placed on cdy cart for signature

## 2010-07-03 NOTE — Telephone Encounter (Signed)
Called and spoke with pt and he is aware per Florentina Addison that his rx have been mailed to his home address.

## 2010-07-06 NOTE — Discharge Summary (Signed)
NAME:  [name], [name] NO.:  [id number]  MEDICAL RECORD NO.:  [id number]           PATIENT TYPE:  I  LOCATION:  1612                         FACILITY:  [facility]  PHYSICIAN:  Madlyn Frankel. Charlann Boxer, M.D.  DATE OF BIRTH:  1950-11-11  DATE OF ADMISSION:  07/01/2010 DATE OF DISCHARGE:  07/03/2010                              DISCHARGE SUMMARY   ADMITTING DIAGNOSIS:  Right knee osteoarthritis.  DISCHARGE DIAGNOSES: 1. Right knee osteoarthritis, status post right total knee     replacement. 2. Chronic obstructive pulmonary disease/asthma. 3. Bronchitis, chronic. 4. Hypertension. 5. Seasonal allergies. 6. Depression. 7. Anxiety. 8. Sleep apnea.  BRIEF HISTORY:  [name] is a 60 year old gentleman who presented to the office for advanced bilateral knee osteoarthritis.  He underwent a left total knee replacement in November 2011 that he has done very well with and he is here for a scheduled right total knee replacement.  Risks and benefits were reviewed in the office and consent obtained at that time.  HOSPITAL COURSE:  The patient was admitted for same-day surgery on July 01, 2010, for right total knee replacement.  Please see dictated operative note for full details of the procedure.  The procedure went uncomplicated.  He was transferred to the recovery room and then to the orthopedic [facility].  On postop day #1, he was seen and evaluated by Physical Therapy, was mobilized with range of motion and early gait. His Foley catheter and Hemovac drains were removed.  He was placed on a regular diet.  Postoperatively, his labs remained stable with a hematocrit on postoperative day 1 of 38.0.  By day 2, it was down to 33.0.  He otherwise remained stable.  His electrolytes remained stable without complication.  By postop day #2, he had progressed nicely with physical therapy, he is ready to go home, he felt that his overall recovery at this point was better than it was in  November.  DISCHARGE CONDITION:  Stable.  DISCHARGE INSTRUCTIONS:  He will be seen and evaluated by Home Health Physical Therapy that he worked before to work on range of motion, strengthening, and gait training.  He will return to see Dr. Durene [name] at [facility] at [phone] in 2 weeks' time.  He may shower keeping his wound dry.  He will use an Aquacel dressing, which will remain in place for 8 days and then covering with dry gauze and try to prevent the maceration wound getting too wet.  DISCHARGE MEDICATIONS: 1. Colace 100 mg p.o. b.i.d. as needed for constipation while on pain     medicine. 2. Aspirin 325 mg p.o. b.i.d. for 30 days and then return to 81 mg a     day. 3. Iron 325 mg one tablet 2-3 times a day as needed for a week or two. 4. Norco 7.5/325 mg 1-2 tabs every 4-6 hours as needed for pain. 5. Robaxin 500 mg p.o. q.6 hours as needed for muscle spasm and pain. 6. Accolate 20 mg b.i.d. 7. Advair Diskus 1 puff b.i.d. 8. Amlodipine/benazepril 5/10 mg 1 tablet q.h.s. 9. Astelin 1 spray q.h.s. as needed. 10.Benefiber  over-the-counter 1 tablespoon b.i.d. 11.Combivent inhaler 2 puffs 4 times a day as needed. 12.Concerta XR 27 mg q.a.m. 13.Fexofenadine 180 mg q.h.s. 14.Lexapro 30 mg q.h.s. as directed by his primary care physician. 15.Lunesta 30 mg q.h.s. as needed. 16.Nasonex 1 spray b.i.d. 17.Uroxatral one tablet q.a.m. 18.Vivactil 10 mg q.h.s.     Madlyn Frankel Charlann Boxer, M.D.     MDO/MEDQ  D:  07/03/2010  T:  07/04/2010  Job:  [id number]  Electronically Signed by [name] M.D. on 07/06/2010 09:59:50 AM

## 2010-07-09 LAB — BASIC METABOLIC PANEL
BUN: 13 mg/dL (ref 6–23)
BUN: 15 mg/dL (ref 6–23)
CO2: 26 mEq/L (ref 19–32)
Calcium: 8.4 mg/dL (ref 8.4–10.5)
Creatinine, Ser: 0.77 mg/dL (ref 0.4–1.5)
GFR calc Af Amer: >60 mL/min (ref >60)
GFR calc non Af Amer: >60 mL/min (ref >60)
GFR calc non Af Amer: >60 mL/min (ref >60)
Glucose, Bld: 136 mg/dL — ABNORMAL HIGH (ref 70–99)
Potassium: 4.2 mEq/L (ref 3.5–5.1)

## 2010-07-09 LAB — DIFFERENTIAL
Basophils Absolute: 0 10*3/uL (ref 0.0–0.1)
Basophils Relative: 0 % (ref 0–1)
Eosinophils Absolute: 0.4 10*3/uL (ref 0.0–0.7)
Eosinophils Relative: 3 % (ref 0–5)
Lymphocytes Relative: 12 % (ref 12–46)
Monocytes Absolute: 0.8 10*3/uL (ref 0.1–1.0)

## 2010-07-09 LAB — CBC
HCT: 39.1 % (ref 39.0–52.0)
HCT: 42.1 % (ref 39.0–52.0)
Hemoglobin: 13.6 g/dL (ref 13.0–17.0)
MCHC: 34.8 g/dL (ref 30.0–36.0)
MCHC: 34.8 g/dL (ref 30.0–36.0)
MCV: 96.1 fL (ref 78.0–100.0)
MCV: 96.6 fL (ref 78.0–100.0)
Platelets: 146 10*3/uL — ABNORMAL LOW (ref 150–400)
RBC: 4.05 MIL/uL — ABNORMAL LOW (ref 4.22–5.81)
RDW: 13.3 % (ref 11.5–15.5)
WBC: 16.2 10*3/uL — ABNORMAL HIGH (ref 4.0–10.5)

## 2010-07-25 ENCOUNTER — Other Ambulatory Visit: Payer: Self-pay | Admitting: Internal Medicine

## 2010-07-28 ENCOUNTER — Other Ambulatory Visit: Payer: Self-pay | Admitting: Internal Medicine

## 2010-07-29 NOTE — Telephone Encounter (Signed)
Please advise if okay to refill medication-pt has appt on 08-30-2010.

## 2010-07-29 NOTE — Telephone Encounter (Signed)
OK to refill Provigil for 6 months    1 + 5 refills

## 2010-08-13 NOTE — Assessment & Plan Note (Signed)
[facility]                             PULMONARY OFFICE NOTE   [name], [name]                      MRN:          [id number]  DATE:02/15/2007                            DOB:          1950-08-18    PROBLEMS:  1. Obstructive sleep apnea.  2. Chronic excessive somnolence.  3. History of maxillofacial reconstruction after trauma.  4. Allergic rhinitis.  5. Asthma.   HISTORY:  Dr. [name]  has put him on a prednisone taper for increased  wheeze and cough recently after he got caught in the rain on election  day.  We talked about whether he would need more maintenance therapy and  it may be appropriate to move up the strength of his Advair Diskus.  That can be assessed after he comes off the prednisone taper.  Has had  flu shot.  He continues CPAP at 15 CWP through [facility] but depends on  his Concerta 27 mg used once or twice daily.  That was the best of all  the approaches we tried.  He looked at the web site for help with the  insomnia component but has not followed through on behavioral management  directions.  He does try to maintain good sleep hygiene but says he  depends on the Lunesta 3 mg and sleeps very badly when he skips it.  We  discussed expectations.   MEDICATIONS:  List is reviewed without changes.   OBJECTIVE:  VITAL SIGNS:  Weight 223 pounds, blood pressure 138/88,  pulse regular 89, room air saturation 100%.  GENERAL APPEARANCE:  Obese, clear, alert.  CHEST:  Breathing is unlabored.  Chest is clear today.  CARDIOVASCULAR:  Heart sounds regular without murmur.   IMPRESSION:  1. Obstructive sleep apnea and complicating insomnia recognizing      interplay of sleep hygiene, depression and medications.  2. Recent exacerbation of asthma for reconsideration as he comes off      of prednisone.   PLAN:  We have refilled his Concerta a little early to save him a phone  call.  He will continue other medications including Vivactil and  will  watch to assess effectiveness.  Schedule return in six months but  earlier p.r.n.     [name] D. Maple Hudson, MD, [name], FACP  Electronically Signed   CDY/MedQ  DD: 02/15/2007  DT: 02/15/2007  Job #: [id number]   cc:   [name]. [name], MD

## 2010-08-13 NOTE — Op Note (Signed)
NAME:  [name], [name] NO.:  [id number]   MEDICAL RECORD NO.:  [id number]          PATIENT TYPE:  AMB   LOCATION:  DAY                          FACILITY:  [facility]   PHYSICIAN:  Wilmon Arms. Corliss Skains, M.D. DATE OF BIRTH:  03-Sep-1950   DATE OF PROCEDURE:  03/16/2007  DATE OF DISCHARGE:                               OPERATIVE REPORT   PREOPERATIVE DIAGNOSIS:  Umbilical hernia.   POSTOPERATIVE DIAGNOSIS:  Umbilical hernia.   PROCEDURE PERFORMED:  Laparoscopic umbilical hernia repair with mesh.   SURGEON:  Wilmon Arms. Corliss Skains, M.D., FACS   ANESTHESIA:  General.   INDICATIONS:  The patient is a 60 year old male who is morbidly obese  who presents with an enlarging umbilical hernia.  Occasionally this  causes some pain in his lower abdomen.  He denies any GI obstructive  symptoms.   DESCRIPTION OF PROCEDURE:  The patient was brought to the operating  room, placed in the supine position on the operating room table.  After  an adequate level of general anesthesia was obtained, a Foley catheter  was placed under sterile technique.  The patient's abdomen was shaved,  prepped with Betadine and draped in sterile fashion.  A time-out was  taken assure the proper patient, proper procedure.  The patient has a  large protruding umbilical hernia.  We used Ioban drape as part of our  draping procedure.  A bit below the left costal margin we anesthetized a  1 cm area.  A 1 cm incision was made and we advanced a 11-mm OptiVu  trocar into the peritoneal cavity.  We insufflated CO2 maintaining  maximal pressure of 15 mmHg.  The laparoscope was inserted.  A small  umbilical defect was noted.  There seemed to be two adjacent defects.  These were relatively small but covering a combined area of about 6 cm.  The spinal needle was used to demarcate the exact edges of the hernia  defect.  These were marked on the skin surface.  Then adding several  centimeters for underlap, we used a 15 x 15  piece of Proceed mesh.  We  placed four interrupted 0 Novofil sutures.  The mesh was rolled up and  inserted through the large trocar.  It was unfurled inside the abdomen.  The Endoclose device was used to pull up the stay sutures in all four  quadrants.  The mesh was then secured with these stay sutures.  The mesh  adequately covered the hernia defect.  The Endotak device was then used  to place tacks circumferentially at 1 cm intervals.  Several interrupted  tacks were placed inside the ring of tacks.  We inspected for  hemostasis.  The trocars were then removed as pneumoperitoneum was  released.  Several deep sutures of 4-0 Monocryl were placed.  Dermabond  was used to close the skin.  The patient then had his Foley catheter and  removed.  He was extubated, brought to recovery in stable condition.  All sponge, instrument, needle counts correct.      Wilmon Arms. Tsuei, M.D.  Electronically Signed  MKT/MEDQ  D:  03/16/2007  T:  03/16/2007  Job:  [id number]

## 2010-08-13 NOTE — Assessment & Plan Note (Signed)
[facility]                             PULMONARY OFFICE NOTE   [name], [name]                      MRN:          [id number]  DATE:08/17/2006                            DOB:          [birth year]    PULMONARY OFFICE FOLLOW-UP:   PROBLEMS:  1. Obstructive sleep apnea.  2. Chronic excessive somnolence.  3. History of maxillofacial reconstruction after trauma.  4. Allergic rhinitis.  5. Asthma.   HISTORY:  He continues CPAP at 15 CWP through [facility] and he  continues to work with a Veterinary surgeon on his depression problems.  He tried  a sample of Vivactil in the past to see if it would help his chronic  complaint of daytime tiredness.  He does not remember why he did not  continue it.  He does not remember any adverse effects.  He continues to  use Concerta b.i.d. and finds that helps, just not enough.  He tried the  strategy of setting two alarm clocks but could not wake up enough to  take the morning medication an hour before final wake-up with the second  clock.  Bedtime is usually by midnight.  He feels he needs Lunesta,  otherwise sleep is tormented and disturbed, fragmented, and not  restful.  He took a prednisone taper in early April, which helped his  breathing.  His employer is working with him but they expect him to call  in if he is going to be late in the morning and he cannot wake up enough  to get himself up in time to get to work by 9:30.  We discussed options,  the use of naps, earlier bedtimes, and other strategies.   MEDICATIONS:  1. Accolate 20 mg.  2. Lotrel 5/10 mg.  3. Lexapro 20 mg x1.5.  4. Advair 100/50 mg.  5. Concerta 27 mg one or two.  6. Allegra 180 mg.  7. Nasonex 50 mg.  8. Astelin used p.r.n.  9. CPAP 15 CWP through [facility].  10.Albuterol rescue inhaler.  11.Lunesta 3 mg at night.  12.Ibuprofen.   No medication allergy.   OBJECTIVE:  VITAL SIGNS:  Weight 220 pounds, BP 118/70, pulse 82, room  air saturation 97%.  CHEST:  Trace wheeze, cleared with a deep breath.  He seems comfortable  and stable otherwise.  HEENT:  No evident nasal congestion.  CARDIAC:  Pulse regular.  EXTREMITIES:  No tremor.   IMPRESSION:  Chronic sleep complaints of insomnia and daytime tiredness,  partly reflecting a very flattened sleep-wake profile.  Obstructive  sleep apnea, controlled with CPAP at 15 CWP.  Sleep problems are  affecting his job because of his difficulty getting up and motivated to  get to work on time.   PLAN:  1. I have referred him to a web site for cognitive behavioral therapy      for assistance for insomnia, hoping this may supplement his work      with his counselor.  2. Nasonex nasal spray.  3. We discussed options as above, the possibility of referral to      [facility] sleep program, which is run by a psychiatrist, and      we refilled his Concerta 27 mg to take one or two daily p.r.n.  4. Schedule a return 6 months, earlier p.r.n.     Clinton D. Maple Hudson, MD, [name], FACP  Electronically Signed    CDY/MedQ  DD: 08/24/2006  DT: 08/24/2006  Job #: [id number]   cc:   [name]. [name], MD

## 2010-08-13 NOTE — Discharge Summary (Signed)
NAMEHARRINGTON, JOBE NO.:  [id number]   MEDICAL RECORD NO.:  [id number]          PATIENT TYPE:  INP   LOCATION:  1535                         FACILITY:  [facility]   PHYSICIAN:  Wilmon Arms. Corliss Skains, M.D. DATE OF BIRTH:  14-Nov-1950   DATE OF ADMISSION:  08/16/2008  DATE OF DISCHARGE:  08/20/2008                               DISCHARGE SUMMARY   ADMISSION DIAGNOSIS:  Recurrent umbilical hernia.   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   PROCEDURE:  laparoscopic converted to an open umbilical and ventral  hernia repair with mesh.   BRIEF HISTORY:  This is a 60 year old male who is morbidly obese who  underwent a laparoscopic umbilical hernia repair in December 2008.  Over  the last several months his umbilicus has become larger and become  protruding.  A CT scan showed that he might have a recurrence of this  hernia with a loop of small bowel protruding but not obstructed.  He  presents now for repair.   HOSPITAL COURSE:  The patient was brought to the operating room on May  19.  Initially we placed a laparoscope, and noted a lot of adhesions to  the undersurface of the mesh.  These adhesions were taken down, and it  appeared that the mesh was actually good placement.  However, at the  patient's midline of his abdominal wall had protruded through what  appeared to be a rectus diastasis.  He had eventration of the anterior  abdominal wall.  It became obvious that we could not repair this with  laparoscopically placed mesh.  Therefore, we converted to an open  procedure.  We did have to excise most of his umbilicus as the skin was  ischemic.  We created a midline incision and removed our previous mesh.  We then plicated the edges of the rectus fascia, and did an onlay repair  with Prolene mesh.  The patient had significant postoperative pain  issues as well as some urinary retention.  He was started on Flomax  which relieved his  urinary retention.  He was able to void prior to  discharge.  His pain became better controlled, and his postoperative  ileus resolved.  He was discharged home on May 23.   DISCHARGE INSTRUCTIONS:  The patient should refrain from any heavy  lifting.  Abdominal binder whenever he is out of bed.  Follow-up in 1  week for staple removal.  He is given Vicodin p.r.n. for pain.      Wilmon Arms. Tsuei, M.D.  Electronically Signed     MKT/MEDQ  D:  08/31/2008  T:  08/31/2008  Job:  [id number]

## 2010-08-13 NOTE — Op Note (Signed)
[name], [name] NO.:  [id number]   MEDICAL RECORD NO.:  [id number]          PATIENT TYPE:  INP   LOCATION:  1535                         FACILITY:  [facility]   PHYSICIAN:  Wilmon Arms. Corliss Skains, M.D. DATE OF BIRTH:  1950-10-14   DATE OF PROCEDURE:  08/16/2008  DATE OF DISCHARGE:                               OPERATIVE REPORT   PREOPERATIVE DIAGNOSIS:  Recurrent ventral hernia.   POSTOPERATIVE DIAGNOSES:  Recurrent ventral hernia.   PROCEDURE PERFORMED:  Laparoscopic, converted to open, umbilical/ventral  hernia repair with mesh.   SURGEON:  Wilmon Arms. Corliss Skains, M.D.   ASSISTANT:  Anselm Pancoast. Zachery Dakins, M.D.   ANESTHESIA:  General endotracheal.   INDICATIONS:  This is an obese, 60 year old male who underwent a  laparoscopic umbilical hernia repair March 16, 2007.  The patient  states that, over the last several months, his umbilicus has become  larger and has begun protruding and caused some discomfort.  A CT scan  showed that he may have a recurrence with a knuckle of nonobstructed  small bowel.  He presents now for repair of his recurrent hernia.   DESCRIPTION OF PROCEDURE:  The patient was brought to the operating  room, placed in the supine position on the operating room table with his  arms tucked.  After an adequate level of general anesthesia was  obtained, a Foley catheter was placed under sterile technique.  The  patient's abdomen was prepped with Chloraprep and we prepped his groins  with Betadine.  He was draped in sterile fashion.  Time-out was taken to  assure the proper patient, proper procedure.   A 5-mm OptiVu trocar was used to cannulate the peritoneal cavity in the  left upper quadrant below the costal margin.  We insufflated CO2,  maintaining maximal pressure of 15 mmHg.  We could visualize a large  amount of omentum, densely adherent to the anterior abdominal wall, at  the area of the mesh.  We inserted two additional 5-mm ports on the  left  side in the anterior axillary line.  We tilted the patient towards his  right side.  We then used sharp dissection to take down the omental  adhesions to the mesh.  Once we had taken down the omentum, we were able  to visualize some small bowel densely adherent to the mesh.  Previously,  we had placed a 15 x 15-cm sheet of the Proceed mesh.  With gentle  traction and meticulous sharp dissection, we were able to dissect the  small bowel off of the mesh.  We then carefully examined the anterior  abdominal wall.  The mesh was intact and had not come loose at any  point.  The small bowel had been adherent to the undersurface of the  mesh.  It seemed that his entire periumbilical region had become  weakened and completely eventrated, containing the entire mesh.  This  was not so much a recurrence of this hernia as a weakness of his  anterior abdominal wall.   We made the decision to convert to an open procedure and close his  fascia  and use onlay mesh to reinforce this area.  Putting another piece  of intraperitoneal underlay mesh would not have fixed his problem and we  continued have fairly large eventration of his anterior abdominal wall  if we did not close it.   Therefore, we made a vertical midline incision around his umbilicus.  We  excised some of the extra periumbilical skin.  Dissection was carried  down to the subcutaneous tissues.  We dissected around the old hernia  sac until we were able to reach down to the fascia.  We were able to  visualize the mesh.  We made the decision to explant the mesh, as it was  not really doing any good.  The mesh was removed in its entirety.  We  brought a 15 x 15 sheet of polypropylene mesh onto the field and cut  this in a 10 x 15 rectangle.  We placed some transfascial sutures of #1  Novofil along the right side.  We then secured the mesh down the left  side and across the top with further mattress sutures of #1 Novofil.  These were tied  down on the left side.  The fascia was then  reapproximated with figure-of-eight Novofil sutures to close the fascia.  The tails of these fascial closure sutures were used to tack down the  mesh in the center.  We then used our previously-placed mattress sutures  on the right side to secure the mesh down to the anterior surface of the  fascia in onlay fashion.  The subcutaneous tissues were thoroughly  irrigated.  We closed the subcutaneous fat with 3-0 Vicryl.  Staples  were used to close the skin.  Prior to closing the skin, we had  reinsufflated and inserted a laparoscope.  There was one adhesion of one  of the appendices epiploica of the colon, which had gotten caught in one  of the sutures and this was taken down.  The remainder of the abdominal  wall seemed to be intact.  We released insufflation and removed the  trocars.  Staples were used to close all the skin incisions.  The  patient was then extubated and brought to the recovery room in stable  condition.  All sponge, instrument, and needle counts were correct.      Wilmon Arms. Tsuei, M.D.  Electronically Signed     MKT/MEDQ  D:  08/16/2008  T:  08/16/2008  Job:  [id number]

## 2010-08-16 NOTE — Assessment & Plan Note (Signed)
[name]                               PULMONARY OFFICE NOTE   [name], [name]                      MRN:          [id number]  DATE:01/12/2006                            DOB:          02/11/1951    PULMONARY/SLEEP FOLLOWUP   PROBLEMS:  1. Obstructive sleep apnea.  2. Chronic excessive somnolence.  3. 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  4. Allergic rhinitis.  5. Asthma.   HISTORY:  He says he lives with his fatigue but it has been worse lately.  His supervisor at work is cooperative but has commented on it.  He has had  to call in on a few days when it was just too hard to wake up for him to get  to work.  He had discussed question of depression versus fatigue with Dr.  Fabian [name].  He does use his CPAP and had the mask checked.  He rolls around a  fair amount in sleep and loosens the mask by doing so.  Astelin and Nasonex  have helped nasal congestion but he still notices some rhinorrhea.  He has  had no problems with asthma.  He denies any history of heart trouble.  Naps  do help.   MEDICATIONS:  1. Accolate 20 mg once or sometimes twice daily.  2. Lotrel 5/10.  3. Lexapro 20 mg x1.5.  4. Advair 100/50 b.i.d.  5. Concerta 27 mg daily.  6. Allegra 180 mg .  7. Nasonex 50 mg.  8. Astelin p.r.n.  9. CPAP 15 cwp.  10.Rescue albuterol inhaler rarely used.  11.He has some Lunesta 3 mg tabs used only rarely.   No medication allergy.   OBJECTIVE:  VITAL SIGNS:  Weight 231 pounds.  BP 136/70.  Pulse regular ate  76.  Room air saturation 97%.  GENERAL:  He is alert.  As we talk, he sits jiggling his right leg.  There  is no tremor.  Palms are dry.  Speech quality is clear.  Thoughts are well  organized and he does not seem withdrawn.  He is somewhat overweight.  HEENT:  His nasal airway is unobstructed and there is no stridor or  thyromegaly.  HEART:  Heart sounds are regular without murmur or gallop.  LUNGS:  Clear.   IMPRESSION:  Obstructive sleep apnea with residual excessive daytime  somnolence despite use of Concerta and continuous positive airway pressure.  I think there probably is a component of depression.  He could be treated  with a non-sedating antidepressant but he may need to work with a Writer.   PLAN:  1. For now, I have agreed to let him try doubling the Concerta to taking 1-      2 of the 27 mg tablets daily as needed.  2. Schedule return in 1 month, or earlier p.r.n.       [name] D. Maple Hudson, MD, FCCP, FACP      CDY/MedQ  DD:  01/17/2006  DT:  01/19/2006  Job #:  [id number]   cc:   [name]. [name] Sharp, MD

## 2010-08-16 NOTE — H&P (Signed)
NAME:  [name], HIGHLEY NO.:  [id number]   MEDICAL RECORD NO.:  [id number]          PATIENT TYPE:  INP   LOCATION:  0473                         [facility]AC[facility]  [facility]   PHYSICIAN:  Sean A. Everardo All, M.D. [facility] OF BIRTH:  10/05/1950   DATE OF ADMISSION:  02/29/2004  DATE OF DISCHARGE:                                HISTORY & PHYSICAL   REASON FOR ADMISSION:  Pneumonia.   HISTORY OF THE PRESENT ILLNESS:  The patient a 60 year old man with 5 days  of diffuse myalgias. He has associated severe fever and chills and a  productive cloudy cough with associated hemoptysis.   PAST MEDICAL HISTORY:  1.  Depression.  2.  Hypertension.  3.  Sleep apnea.  4.  Allergic rhinitis.  5.  Asthma.   MEDICATIONS:  1.  Accolate 20 mg a day.  2.  Lexapro 20 mg a day.  3.  Lotrel 5/10 one daily.  4.  Allegra 180 mg daily.  5.  Lunesta 3 mg daily.  6.  Flovent at an uncertain dosage.   SOCIAL HISTORY:  The patient works in Nurse, adult for [employer], and he  is single.   FAMILY HISTORY:  Several family members, which he visited over the recent  Thanksgiving Holiday Weekend, have been ill.   REVIEW OF SYSTEMS:  Denies the following: Nausea, vomiting, loss of  consciousness, hematuria, rectal bleeding, skin rash, seizure, visual loss,  numbness, dysuria, and weight loss.   PHYSICAL EXAMINATION:  VITAL SIGNS:  Blood pressure is 118/63, heart rate is  132, respiratory rate is 22, temperature is 103.7.  GENERAL:  Appears slightly ill.  SKIN:  Not diaphoretic.  HEENT:  Head is atraumatic, no proptosis, no periorbital swelling. Pharynx:  No erythema. Mucous membranes are dry.  NECK:  Supple.  CHEST:  Clear to auscultation except for a few rales on the left.  CARDIOVASCULAR:  No JVD. No edema. Tachycardic, regular rhythm, no murmur.  Pedal pulses are intact.  ABDOMEN:  Soft, obese, nontender, no hepatosplenomegaly, no mass.  GENITAL/RECTAL EXAMINATIONS:  Not done at this time due  to patient  condition.  EXTREMITIES:  No deformity.  NEUROLOGIC:  Alert, well oriented, cranial nerves appear to be intact, and  he moves all fours.   LABORATORY DATA:  Chest x-ray shows pneumonia on the right side.  Electrocardiogram shows sinus tachycardia and right bundle branch block.  BMET remarkable for sodium 134.   IMPRESSION:  1.  Pneumonia.  2.  Mild hyponatremia which could be due to his Lotrel or to the pneumonia      itself.  3.  Other medical problems as noted above.   PLAN:  1.  Blood cultures.  2.  Antibiotics.  3.  Intravenous fluids.  4.  Recheck BMET.  5.  Hand-held nebulizer therapy.  6.  I discussed the code status with the patient; he states he wants to be      full code, but would not want to be started nor maintained on artificial      life support measures if there was not a reasonable chance  of a      functional recovery.      SAE/MEDQ  D:  03/01/2004  T:  03/01/2004  Job:  [id number]   cc:   [name]. [name], M.D. [facility]

## 2010-08-16 NOTE — Discharge Summary (Signed)
NAMEOSBORN, PULLIN NO.:  [id number]   MEDICAL RECORD NO.:  [id number]          PATIENT TYPE:  INP   LOCATION:  0473                         FACILITY:  [facility]   PHYSICIAN:  Rene Paci, M.D. LHCDATE OF BIRTH:  03-11-1951   DATE OF ADMISSION:  02/29/2004  DATE OF DISCHARGE:  03/04/2004                                 DISCHARGE SUMMARY   DISCHARGE DIAGNOSES:  1.  Community acquired right sided pneumonia, improved.  2.  Asthma exacerbation secondary to above.  3.  History of obstructive sleep apnea, continue home CPAP as prior to      admission.  4.  History of hypertension, continue home medications.   DISCHARGE MEDICATIONS:  1.  Avelox 400 mg p.o. q.d. x8 additional days to complete two week course.  2.  Albuterol nebulizers 2.5 mg, 3-4 times daily as needed for wheeze and      shortness of breath.   Other medications are as prior to admission and include:  1.  Lotrel 5/10, 1 daily.  2.  Lexapro 20 mg, 1 daily.  3.  Allegra 180, 1 p.o. q.d.  4.  Advair 100/50 b.i.d.  5.  Accolade 20 mg q.d.  6.  Baby aspirin 81 mg p.o. q.d.   FOLLOW UP:  The patient has a followup appointment with primary care  physician, [name]. Panosh, M.D., for Wednesday, December 7 at 10 a.m.  The  patient is instructed not to return to work or undergo strenuous activity  until cleared by Dr. [name].   CONDITION ON DISCHARGE:  Medically stable tolerating room air saturations,  ambulating in halls and tolerating p.o. antibiotics.   HOSPITAL COURSE:  PNEUMONIA WITH ASTHMA EXACERBATION.  The patient is a  pleasant 60 year old gentleman who has been having one week of increasing  cough, fever and chills who came to the emergency room due to increasing  shortness of breath with wheezing and was found to have right sided  infiltrate on chest x-ray, admitted for IV antibiotics to treat his  pneumonia as well as IV Solu-Medrol for his asthma exacerbation. The patient  has improved  and is now tolerating room air saturations, ambulating without  dyspnea on mild exertion and overall stable. Discharged home to continue  plans as above and outpatient followup closely this week.  No return to work  until cleared by primary M.D.   Other medical issues are as prior to admission and no other changes are made  to his regimen.    Vale  VL/MEDQ  D:  03/04/2004  T:  03/05/2004  Job:  [id number]

## 2010-08-16 NOTE — Assessment & Plan Note (Signed)
[facility]                               PULMONARY OFFICE NOTE   TAMARI, BUSIC                      MRN:          [id number]  DATE:02/12/2006                            DOB:          08/19/1950    PULMONARY/SLEEP FOLLOWUP.   PROBLEMS:  1. Obstructive sleep apnea.  2. Chronic excessive somnolence.  3. History of maxillofacial reconstruction after trauma.  4. Allergic rhinitis.  5. Asthma.   HISTORY:  Plodding along.  He found b.i.d. Concerta to be some help.  The  original idea was that he would take his first dose early, perhaps returning  to sleep for an hour apparently kicked in.  He still has a difficult time  getting started in the mornings, and reports no overstimulation at all.  Alfonso Patten has helped sleep quality, and he thinks he sleeps okay as near as he  can tell.  He likes the current nasal pillows CPAP mask, and continues CPAP  pressure at 15 CWP through [facility].  He denies nasal congestion or  discharge.  Eyes tend irritated both on the periorbital skin and on the  globes.  He says this is a longstanding issue, and he has tried a number of  drops.  Asthma has not been a problem on current meds.  He had urologic  evaluation with Dr. Vonita Moss, who checked a testosterone level.  We  discussed his previous experience with other stimulant medications,  including Adderall and Provigil, and I reviewed risk-benefit concerns of  Concerta.   MEDICATIONS:  1. Accolate 20 mg once daily.  2. Lotrel 5/10.  3. Lexapro 20 mg x one and a half.  4. Advair 100/50.  5. Concerta 27 mg, used once or twice.  6. Allegra 180 mg.  7. Nasonex.  8. Astelin.  9. CPAP at 15 CWP.  10.Albuterol inhaler.  11.Lunesta 3 mg.  12.Ibuprofen.   ALLERGIES:  No medication allergy.   OBJECTIVE:  Weight 222 pounds, BP 112/82, pulse regular 83, room air  saturation 99%.  This is an obese, alert man.  There is mild periorbital drying without  scale.  Conjunctivae look clear.  Nasal airways not obstructed or edematous.  There is no stridor.  CHEST:  Exam reveals breath sounds just a little bit coarser than normal in  the posterior bases, but without cough or wheeze.   IMPRESSION:  His main complaint remains daytime fatigue.  He is well aware  of the potential for this to be related to depression and/or to his sleep  disorder.   PLAN:  1. Sleep hygiene and use of short-term naps.  2. Lacrilube ointment for complaint of dryness.  3. Try adding Vivactil 10 mg in the morning, given samples.  This is a      nonsedating, older antidepressant with some capacity based on prior      experience to address sleep apnea and serve as an alerting medication      in a different drug class.  We spent time discussing potential side      effects and goals.  He will call  for a prescription if helpful.  4. Schedule return in 6 months, earlier p.r.n.     Clinton D. Maple Hudson, MD, [name], FACP  Electronically Signed    CDY/MedQ  DD: 02/12/2006  DT: 02/13/2006  Job #: [id number]   cc:   [name]. [name], MD

## 2010-08-30 ENCOUNTER — Ambulatory Visit (INDEPENDENT_AMBULATORY_CARE_PROVIDER_SITE_OTHER): Payer: BC Managed Care – PPO | Admitting: Internal Medicine

## 2010-08-30 ENCOUNTER — Encounter: Payer: Self-pay | Admitting: Internal Medicine

## 2010-08-30 VITALS — BP 122/76 | HR 80 | Ht 65.0 in | Wt 216.4 lb

## 2010-08-30 DIAGNOSIS — J45909 Unspecified asthma, uncomplicated: Secondary | ICD-10-CM

## 2010-08-30 DIAGNOSIS — G479 Sleep disorder, unspecified: Secondary | ICD-10-CM

## 2010-08-30 DIAGNOSIS — G4733 Obstructive sleep apnea (adult) (pediatric): Secondary | ICD-10-CM

## 2010-08-30 NOTE — Progress Notes (Signed)
  Subjective:    Patient ID: [name], male    DOB: [birth year], 60 y.o.   MRN: [id number]  HPI 56/1/12- 54 yoM followed for OSA, hx asthma Last here March 01, 2010. 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 No respiratory complications. CPAP doing very well. Likes his new machine. 15 cwp [facility]. Lunesta 3 mg every night. He was off Concerta through surgery and when he restarted it seemed too strong. He has stayed off. Continues Vivactil.    Review of Systems Constitutional:   No weight loss, night sweats,  Fevers, chills, fatigue, lassitude. HEENT:   No headaches,  Difficulty swallowing,  Tooth/dental problems,  Sore throat,                No sneezing, itching, ear ache, nasal congestion, post nasal drip,   CV:  No chest pain,  Orthopnea, PND, swelling in lower extremities, anasarca, dizziness, palpitations  GI  No heartburn, indigestion, abdominal pain, nausea, vomiting, diarrhea, change in bowel habits, loss of appetite  Resp: No shortness of breath with exertion or at rest.  No excess mucus, no productive cough,  No non-productive cough,  No coughing up of blood.  No change in color of mucus.     Skin: no rash or lesions.  GU: no dysuria, change in color of urine, no urgency or frequency.  No flank pain.  MS:  No joint pain or swelling.  No decreased range of motion.  No back pain.  Psych:  No change in mood or affect. No depression or anxiety.  No memory loss.      Objective:   Physical Exam General- Alert, Oriented, Affect-appropriate, Distress- none acute  Skin- rash-none, lesions- none, excoriation- none  Lymphadenopathy- none  Head- atraumatic  Eyes- Gross vision intact, PERRLA, conjunctivae clear secretions  Ears- Hearing, canals, Tm- normal  Nose- Clear,  No-Septal dev, mucus, polyps, erosion, perforation   Throat- Mallampati III , mucosa clear , drainage- none, tonsils- atrophic  Neck- flexible , trachea midline, no stridor , thyroid nl,  carotid no bruit  Chest - symmetrical excursion , unlabored     Heart/CV- RRR , no murmur , no gallop  , no rub, nl s1 s2                     - JVD- none , edema- none, stasis changes- none, varices- none     Lung- clear to P&A, wheeze- none, cough- none , dullness-none, rub- none     Chest wall-  Abd- tender-no, distended-no, bowel sounds-present, HSM- no  Br/ Gen/ Rectal- Not done, not indicated  Extrem- cyanosis- none, clubbing, none, atrophy- none, strength- nl  Neuro- grossly intact to observation         Assessment & Plan:

## 2010-08-30 NOTE — Assessment & Plan Note (Signed)
Good compliance and control, holding at 15 / [facility]  supplementewd with lunessta.

## 2010-08-30 NOTE — Assessment & Plan Note (Signed)
Good control

## 2010-08-30 NOTE — Patient Instructions (Signed)
Great to stay off Concerta.  Continue CPAP at 15

## 2010-08-30 NOTE — Assessment & Plan Note (Addendum)
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 He seems much happier now after his knee surgery. I am pleased to leave him off Concerta. He wiill continue vivactil, which is probably more of an antidepressant than a stimulant for him.

## 2010-09-06 ENCOUNTER — Other Ambulatory Visit: Payer: Self-pay | Admitting: Internal Medicine

## 2010-09-06 NOTE — Telephone Encounter (Signed)
Per Dr. [name]- Ok x 1. ASA and motrin together increase risk of bleed.

## 2010-09-06 NOTE — Telephone Encounter (Signed)
Rx sent to pharmacy   

## 2010-09-13 ENCOUNTER — Other Ambulatory Visit (INDEPENDENT_AMBULATORY_CARE_PROVIDER_SITE_OTHER): Payer: BC Managed Care – PPO

## 2010-09-13 DIAGNOSIS — Z0000 Encounter for general adult medical examination without abnormal findings: Secondary | ICD-10-CM

## 2010-09-13 LAB — CBC WITH DIFFERENTIAL/PLATELET
Basophils Absolute: 0 10*3/uL (ref 0.0–0.1)
Eosinophils Absolute: 1 10*3/uL — ABNORMAL HIGH (ref 0.0–0.7)
Hemoglobin: 13.7 g/dL (ref 13.0–17.0)
Lymphocytes Relative: 13.6 % (ref 12.0–46.0)
Lymphs Abs: 1.1 10*3/uL (ref 0.7–4.0)
MCHC: 34.3 g/dL (ref 30.0–36.0)
Neutro Abs: 5.3 10*3/uL (ref 1.4–7.7)
Platelets: 174 10*3/uL (ref 150.0–400.0)
RDW: 13.9 % (ref 11.5–14.6)

## 2010-09-13 LAB — POCT URINALYSIS DIPSTICK
Ketones, UA: NEGATIVE
Protein, UA: NEGATIVE
Spec Grav, UA: 1.02
pH, UA: 7

## 2010-09-13 LAB — HEPATIC FUNCTION PANEL
Albumin: 4.7 g/dL (ref 3.5–5.2)
Alkaline Phosphatase: 78 U/L (ref 39–117)
Total Bilirubin: 0.9 mg/dL (ref 0.3–1.2)

## 2010-09-13 LAB — BASIC METABOLIC PANEL
BUN: 12 mg/dL (ref 6–23)
CO2: 28 mEq/L (ref 19–32)
Calcium: 9.3 mg/dL (ref 8.4–10.5)
Glucose, Bld: 83 mg/dL (ref 70–99)
Sodium: 140 mEq/L (ref 135–145)

## 2010-09-13 LAB — LIPID PANEL
Cholesterol: 174 mg/dL (ref 0–200)
HDL: 39.2 mg/dL (ref >39.00)
VLDL: 12.8 mg/dL (ref 0.0–40.0)

## 2010-09-20 ENCOUNTER — Encounter: Payer: Self-pay | Admitting: Internal Medicine

## 2010-09-24 ENCOUNTER — Encounter: Payer: Self-pay | Admitting: Internal Medicine

## 2010-09-24 ENCOUNTER — Ambulatory Visit (INDEPENDENT_AMBULATORY_CARE_PROVIDER_SITE_OTHER): Payer: BC Managed Care – PPO | Admitting: Internal Medicine

## 2010-09-24 VITALS — BP 120/74 | HR 66 | Ht 64.5 in | Wt 216.0 lb

## 2010-09-24 DIAGNOSIS — F329 Major depressive disorder, single episode, unspecified: Secondary | ICD-10-CM

## 2010-09-24 DIAGNOSIS — R972 Elevated prostate specific antigen [PSA]: Secondary | ICD-10-CM

## 2010-09-24 DIAGNOSIS — E669 Obesity, unspecified: Secondary | ICD-10-CM

## 2010-09-24 DIAGNOSIS — L989 Disorder of the skin and subcutaneous tissue, unspecified: Secondary | ICD-10-CM

## 2010-09-24 DIAGNOSIS — Z0000 Encounter for general adult medical examination without abnormal findings: Secondary | ICD-10-CM | POA: Insufficient documentation

## 2010-09-24 DIAGNOSIS — Z96659 Presence of unspecified artificial knee joint: Secondary | ICD-10-CM

## 2010-09-24 DIAGNOSIS — I10 Essential (primary) hypertension: Secondary | ICD-10-CM

## 2010-09-24 DIAGNOSIS — G4733 Obstructive sleep apnea (adult) (pediatric): Secondary | ICD-10-CM

## 2010-09-24 DIAGNOSIS — J45909 Unspecified asthma, uncomplicated: Secondary | ICD-10-CM

## 2010-09-24 NOTE — Patient Instructions (Addendum)
Continue lifestyle intervention healthy eating and exercise .  Your  sugars an lipids are better .  3500 calories is the energy content of a pound of body weight .Must have a 3500 cal deficit to lose one pound . Thus decrease 500 calorie equivalent per day in food or drink intake / or exercise  for 7 days to lose one pound. Can have   Dermatologist   chck area on the leg  May be a wart.  Continue    Other meds.   Otherwise  ROV in 6 months

## 2010-09-24 NOTE — Progress Notes (Signed)
Subjective:    Patient ID: [name], male    DOB: [birth year], 60 y.o.   MRN: [id number]  HPI Patient comes in today for preventive visit and follow-up of medical issues. Update of her history since last visit. Now walking to work 1.7  miles most days of the week. His knee pain is a lot better and thus he can do more exercise.   OSA  Off the concerta se and not that helpful ( had been off for the surgery)   Mood is improved since his pain is down he is able to ambulate better. Hypertension no change. Sleep still taking Lunesta prior authorization will need to be repeated. Knee pain: Much better since surgery his is not using a narcotic and only rarely using the ibuprofen. Is aware that aspirin and anti-inflammatories can increase the risk of bleeding.   Review of Systems  ROS:  GEN/ HEENTNo fever, significant weight changes sweats headaches vision problems hearing changes, CV/ PULM; No chest pain shortness of breath cough, syncope,edema  change in exercise tolerance. Allergy asthma appears to be controlled recently. GI /GU: No adominal pain, vomiting, change in bowel habits. No blood in the stool. No significant GU symptoms. Followed by Dr. Shiela Mayer for elevated PSA and has history of prostate biopsies. SKIN/HEME: ,no acute skin rashes  or bleeding. No lymphadenopathy, nodules, masses. 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 IMM/ Allergy: No unusual infections.     REST of 12 system review negative Past history family history social history reviewed in the electronic medical record.      Objective:   Physical Exam  Wt Readings from Last 3 Encounters:  09/24/10 216 lb (97.977 kg)  08/30/10 216 lb 6.4 oz (98.158 kg)  03/21/10 217 lb (98.431 kg)    Physical Exam: Vital signs reviewed WUJ:WJXB is a  well-developed well-nourished alert cooperative  White male  who appears   stated age in no acute distress.  HEENT: normocephalic  traumatic , Eyes: PERRL EOM's full, conjunctiva clear, Nares: patent no  discharge or tenderness., Ears: no deformity EAC's clear TMs with normal landmarks. Mouth: clear OP, no lesions, edema.  Moist mucous membranes. Dentition no acute changes NECK: supple without masses, thyromegaly or bruits. CHEST/PULM:  Clear to auscultation and percussion breath sounds equal no wheeze , rales or rhonchi. No chest wall deformities or tenderness. CV: PMI is nondisplaced, S1 S2 no gallops, murmurs, rubs. Peripheral pulses are full without delay.No JVD .  ABDOMEN: Bowel sounds normal nontender  No guard or rebound, no hepato splenomegal no CVA tenderness.  No hernia. Some diastasis Extremtities:  No clubbing cyanosis or edema, no acute joint swelling or redness no focal atrophy NEURO:  Oriented x3, cranial nerves 3-12 appear to be intact, no obvious focal weakness,gait within normal limits no abnormal reflexes or asymmetrical SKIN: No acute rashes normal turgor, color, no bruising or petechiae. Right lower extremity shows a 3 mm hyperkeratotic round lesion that is excoriated. PSYCH: Oriented, good eye contact, no obvious depression anxiety, cognition and judgment appear normal. LN:  No cervical axillary or inguinal adenopathy  Labs reviewed       Assessment & Plan:  Preventive Health Care Counseled regarding healthy nutrition, exercise, sleep, injury prevention, calcium vit d and healthy weight .  Obesity  about the same but is now able to  be active. Discussed monitoring intake and strategies for reduction. OSA  stable  Off concerta  ? If had se of thi anyway HT  controlled Mood   seems improved since his pain is down and is able to be more mobile. Elevated PSA  Followed  by Dr Vonita Moss.  Send copy of labs to him.   Asthma    Stable  DJD knee better sp tkr

## 2010-09-25 ENCOUNTER — Other Ambulatory Visit: Payer: Self-pay | Admitting: *Deleted

## 2010-09-26 ENCOUNTER — Other Ambulatory Visit: Payer: Self-pay | Admitting: Internal Medicine

## 2010-09-28 DIAGNOSIS — L989 Disorder of the skin and subcutaneous tissue, unspecified: Secondary | ICD-10-CM | POA: Insufficient documentation

## 2010-10-23 ENCOUNTER — Other Ambulatory Visit: Payer: Self-pay | Admitting: Internal Medicine

## 2010-11-05 ENCOUNTER — Other Ambulatory Visit: Payer: Self-pay

## 2010-11-05 ENCOUNTER — Other Ambulatory Visit: Payer: Self-pay | Admitting: Internal Medicine

## 2010-11-05 NOTE — Telephone Encounter (Signed)
Last filled 09/26/10. Last ov 09/24/10

## 2010-11-06 MED ORDER — ESZOPICLONE 3 MG PO TABS: 3.0000 mg | ORAL_TABLET | Freq: Every day | ORAL | Status: DC

## 2010-11-06 NOTE — Telephone Encounter (Signed)
Call in #30 with no rf  

## 2010-11-07 NOTE — Telephone Encounter (Signed)
done

## 2010-12-10 ENCOUNTER — Telehealth: Payer: Self-pay | Admitting: Internal Medicine

## 2010-12-10 NOTE — Telephone Encounter (Signed)
Confirmed pt address.  Correct in system.  [name]

## 2010-12-10 NOTE — Telephone Encounter (Signed)
Concerta 27 mg, 1 daily by mouth as needed, # 30

## 2010-12-10 NOTE — Telephone Encounter (Signed)
Spoke with pt. States that he would like to try concerta again. He had stopped med previously after his surgery, feeling that med was too strong, but has since then been feeling very fatigued and would like to try the concerta again. He states has a few tablets left over and has tried med again and seems to be helping. Please advise thanks! No Known Allergies

## 2010-12-11 MED ORDER — METHYLPHENIDATE HCL ER (OSM) 27 MG PO TBCR: 27.0000 mg | EXTENDED_RELEASE_TABLET | Freq: Every day | ORAL | Status: DC | PRN

## 2010-12-11 NOTE — Telephone Encounter (Signed)
Rx printed and signed by CDY. Mailed to pt and spoke with pt and notified that this was done.

## 2011-01-06 LAB — CBC
HCT: 39.5
Hemoglobin: 14
MCHC: 35.3
RDW: 13

## 2011-01-06 LAB — DIFFERENTIAL
Basophils Absolute: 0
Basophils Relative: 0
Eosinophils Relative: 7 — ABNORMAL HIGH
Lymphocytes Relative: 15
Monocytes Absolute: 1

## 2011-01-06 LAB — BASIC METABOLIC PANEL
CO2: 27
Chloride: 106
Glucose, Bld: 85
Potassium: 4.1
Sodium: 140

## 2011-01-10 ENCOUNTER — Telehealth: Payer: Self-pay | Admitting: Internal Medicine

## 2011-01-10 MED ORDER — ESZOPICLONE 3 MG PO TABS: 3.0000 mg | ORAL_TABLET | Freq: Every day | ORAL | Status: DC

## 2011-01-10 NOTE — Telephone Encounter (Signed)
Per Dr. [name]- Ok x 6 months. Rx called in.

## 2011-01-10 NOTE — Telephone Encounter (Signed)
Refill Lunesta at [facility]. Would like for Carollee Herter to return his call. He has 2 doses left. Thanks.

## 2011-02-03 ENCOUNTER — Other Ambulatory Visit: Payer: Self-pay | Admitting: Internal Medicine

## 2011-02-10 ENCOUNTER — Encounter: Payer: Self-pay | Admitting: Internal Medicine

## 2011-02-10 ENCOUNTER — Ambulatory Visit (INDEPENDENT_AMBULATORY_CARE_PROVIDER_SITE_OTHER): Payer: BC Managed Care – PPO | Admitting: Internal Medicine

## 2011-02-10 VITALS — BP 120/80 | HR 107 | Temp 98.0°F | Wt 216.0 lb

## 2011-02-10 DIAGNOSIS — J45901 Unspecified asthma with (acute) exacerbation: Secondary | ICD-10-CM

## 2011-02-10 DIAGNOSIS — J069 Acute upper respiratory infection, unspecified: Secondary | ICD-10-CM

## 2011-02-10 DIAGNOSIS — J45909 Unspecified asthma, uncomplicated: Secondary | ICD-10-CM

## 2011-02-10 MED ORDER — PREDNISONE 20 MG PO TABS: ORAL_TABLET | ORAL | Status: AC

## 2011-02-10 NOTE — Progress Notes (Signed)
  Subjective:    Patient ID: [name], male    DOB: 09-26-1950, 60 y.o.   MRN: [id number]  HPI Patient comes in today for SDA  For acute problem evaluation. Dry coughing over the weekend and some tightness in chest.   Sore in chest from coughing   But no fever sweats  Seems like early asthma   Using combivent more than usual.  On advair controller meds  Ur rhinorrhea ? If allergy also. Some itching in throat chest. Persistent  Sx concern about worsening pulm status  Review of Systems No fever ha NVD or chest pain  Bleeding  Rest as per hpi  Past history family history social history reviewed in the electronic medical record.     Objective:   Physical Exam  WDWN in nad  midly congested  No toxic HEENT: Normocephalic ;atraumatic , Eyes;  PERRL, EOMs  Full, lids and conjunctiva clear,,Ears: no deformities, canals nl, TM landmarks normal, Nose: stable deformity  clearish discharge  Mouth : OP clear without lesion or edema . Neck: Supple without adenopathy or masses or bruits Chest:  Scattered  Wheezes no  rales or rhonchi  bs = and good movement  CV:  S1-S2 no gallops or murmurs peripheral perfusion is normal Skin: normal capillary refill ,turgor , color: No acute rashes ,petechiae or bruising Abdomen:  Sof,t normal bowel sounds without hepatosplenomegaly, no guarding rebound or masses no CVA tenderness      Assessment & Plan:  Acute uri  Wheezing asthma flare mild but at risk with sig underlying asthma lung disease  pred x 5 days continue controller meds.  No evidence phf pna Ht stable

## 2011-02-10 NOTE — Patient Instructions (Addendum)
Ok to take prednisone    For asthma flare. But call if any fever   consider getting x ray of needed.   Get flu shot after getting better from  Illness.

## 2011-03-12 ENCOUNTER — Other Ambulatory Visit: Payer: Self-pay | Admitting: Internal Medicine

## 2011-04-08 ENCOUNTER — Ambulatory Visit (INDEPENDENT_AMBULATORY_CARE_PROVIDER_SITE_OTHER): Payer: BC Managed Care – PPO | Admitting: Internal Medicine

## 2011-04-08 ENCOUNTER — Encounter: Payer: Self-pay | Admitting: Internal Medicine

## 2011-04-08 VITALS — BP 110/80 | HR 100 | Wt 218.0 lb

## 2011-04-08 DIAGNOSIS — F329 Major depressive disorder, single episode, unspecified: Secondary | ICD-10-CM

## 2011-04-08 DIAGNOSIS — I10 Essential (primary) hypertension: Secondary | ICD-10-CM

## 2011-04-08 DIAGNOSIS — R972 Elevated prostate specific antigen [PSA]: Secondary | ICD-10-CM

## 2011-04-08 DIAGNOSIS — Z23 Encounter for immunization: Secondary | ICD-10-CM

## 2011-04-08 DIAGNOSIS — J45909 Unspecified asthma, uncomplicated: Secondary | ICD-10-CM

## 2011-04-08 DIAGNOSIS — M1710 Unilateral primary osteoarthritis, unspecified knee: Secondary | ICD-10-CM

## 2011-04-08 DIAGNOSIS — G4733 Obstructive sleep apnea (adult) (pediatric): Secondary | ICD-10-CM

## 2011-04-08 DIAGNOSIS — E669 Obesity, unspecified: Secondary | ICD-10-CM

## 2011-04-08 NOTE — Progress Notes (Signed)
  Subjective:    Patient ID: [name], male    DOB: 08/25/1950, 61 y.o.   MRN: [id number]  HPI Patient comes in today for follow up of  multiple medical problems.  Asthma : no problem   Just  f;lu shot  Today  Last episode  Resolved see last note  HT : doing ok  MOOD: Counseling  Elaine talbert   No change  OSA under rx  On lunesta  New equipments  No allergy sx.currently  No i njury  DJD : right knee doing well  righ sometime problematic but much more active  Since tkr right . Has check in februaary. Elevated psa sees uro in a month  No new sx     Review of Systems No cp sob bleeding gi new gu sx     Objective:   Physical Exam  WDWN in nad  Looks well today  HEENT op clear nares no discharge Neck: Supple without adenopathy or masses or bruits Chest:  Clear to A&P without wheezes rales or rhonchi CV:  S1-S2 no gallops or murmurs peripheral perfusion is normal Abdomen:  Sof,t normal bowel sounds without hepatosplenomegaly, no guarding rebound or masses no CVA tenderness No clubbing cyanosis or edema SKIN: dry on back no acute rashes MOOD: oriented  3 good eye contact  Knees no acute effusino left some crepitus      Assessment & Plan:  HT stable Asthma  Resolved exacerbation OSA  Stable under rx  MOOD continuing r counseling and same meds  DJD stable  Some left knee sx  ELEVated PSA  Bring report to his urologist  No change in meds labs and cpx PV in 6 months  Flu shot   Counseled.   Ok to refill when call pharmacy

## 2011-04-08 NOTE — Patient Instructions (Signed)
Check up in 6 months  With labs

## 2011-04-11 ENCOUNTER — Other Ambulatory Visit: Payer: Self-pay | Admitting: Internal Medicine

## 2011-05-14 ENCOUNTER — Telehealth: Payer: Self-pay | Admitting: *Deleted

## 2011-05-14 MED ORDER — FLUTICASONE-SALMETEROL 100-50 MCG/DOSE IN AEPB: 1.0000 | INHALATION_SPRAY | Freq: Two times a day (BID) | RESPIRATORY_TRACT | Status: DC

## 2011-05-14 NOTE — Telephone Encounter (Signed)
refill 

## 2011-07-15 ENCOUNTER — Other Ambulatory Visit: Payer: Self-pay

## 2011-07-15 NOTE — Telephone Encounter (Signed)
Rx request for lunesta 3 mg.  Pt last seen 04/08/11.  Rx last filled 01/10/11 #30x 5 rf.  Pls advise.

## 2011-07-16 MED ORDER — ESZOPICLONE 3 MG PO TABS: 3.0000 mg | ORAL_TABLET | Freq: Every day | ORAL | Status: DC

## 2011-07-16 NOTE — Telephone Encounter (Signed)
Ok to refill x 6 months 

## 2011-08-12 ENCOUNTER — Other Ambulatory Visit: Payer: Self-pay | Admitting: Internal Medicine

## 2011-08-12 NOTE — Telephone Encounter (Signed)
Ok to refill protriptylline

## 2011-08-12 NOTE — Telephone Encounter (Signed)
Please advise if ok to refill. Thanks 

## 2011-08-13 ENCOUNTER — Other Ambulatory Visit: Payer: Self-pay | Admitting: Internal Medicine

## 2011-08-15 ENCOUNTER — Other Ambulatory Visit: Payer: Self-pay | Admitting: Internal Medicine

## 2011-08-19 NOTE — Telephone Encounter (Signed)
Pt last seen 04/08/2011.  Rx last filled 03/12/11 #45 x 4 rf.

## 2011-08-19 NOTE — Telephone Encounter (Signed)
Ok x 6 months 

## 2011-09-29 ENCOUNTER — Other Ambulatory Visit (INDEPENDENT_AMBULATORY_CARE_PROVIDER_SITE_OTHER): Payer: BC Managed Care – PPO

## 2011-09-29 DIAGNOSIS — Z0000 Encounter for general adult medical examination without abnormal findings: Secondary | ICD-10-CM

## 2011-09-29 LAB — HEPATIC FUNCTION PANEL
ALT: 21 U/L (ref 0–53)
Bilirubin, Direct: 0.2 mg/dL (ref 0.0–0.3)
Total Bilirubin: 1.3 mg/dL — ABNORMAL HIGH (ref 0.3–1.2)

## 2011-09-29 LAB — BASIC METABOLIC PANEL
BUN: 14 mg/dL (ref 6–23)
Calcium: 9.4 mg/dL (ref 8.4–10.5)
Chloride: 103 mEq/L (ref 96–112)
Creatinine, Ser: 0.7 mg/dL (ref 0.4–1.5)

## 2011-09-29 LAB — CBC WITH DIFFERENTIAL/PLATELET
Eosinophils Absolute: 0.9 10*3/uL — ABNORMAL HIGH (ref 0.0–0.7)
Eosinophils Relative: 10.4 % — ABNORMAL HIGH (ref 0.0–5.0)
Lymphocytes Relative: 12 % (ref 12.0–46.0)
MCV: 97.8 fl (ref 78.0–100.0)
Monocytes Absolute: 0.9 10*3/uL (ref 0.1–1.0)
Neutrophils Relative %: 66.8 % (ref 43.0–77.0)
Platelets: 149 10*3/uL — ABNORMAL LOW (ref 150.0–400.0)
WBC: 8.8 10*3/uL (ref 4.5–10.5)

## 2011-09-29 LAB — POCT URINALYSIS DIPSTICK
Blood, UA: NEGATIVE
Protein, UA: NEGATIVE
Spec Grav, UA: 1.01
Urobilinogen, UA: 0.2

## 2011-09-29 LAB — LIPID PANEL
Cholesterol: 189 mg/dL (ref 0–200)
LDL Cholesterol: 132 mg/dL — ABNORMAL HIGH (ref 0–99)
Triglycerides: 82 mg/dL (ref 0.0–149.0)
VLDL: 16.4 mg/dL (ref 0.0–40.0)

## 2011-09-30 ENCOUNTER — Ambulatory Visit: Payer: BC Managed Care – PPO | Admitting: Internal Medicine

## 2011-10-06 ENCOUNTER — Ambulatory Visit (INDEPENDENT_AMBULATORY_CARE_PROVIDER_SITE_OTHER): Payer: BC Managed Care – PPO | Admitting: Internal Medicine

## 2011-10-06 ENCOUNTER — Encounter: Payer: Self-pay | Admitting: Internal Medicine

## 2011-10-06 VITALS — BP 128/64 | HR 87 | Ht 65.0 in | Wt 221.8 lb

## 2011-10-06 VITALS — BP 132/84 | HR 95 | Temp 97.9°F | Ht 64.5 in | Wt 226.0 lb

## 2011-10-06 DIAGNOSIS — F5104 Psychophysiologic insomnia: Secondary | ICD-10-CM

## 2011-10-06 DIAGNOSIS — Z0000 Encounter for general adult medical examination without abnormal findings: Secondary | ICD-10-CM

## 2011-10-06 DIAGNOSIS — G4700 Insomnia, unspecified: Secondary | ICD-10-CM

## 2011-10-06 DIAGNOSIS — R972 Elevated prostate specific antigen [PSA]: Secondary | ICD-10-CM

## 2011-10-06 DIAGNOSIS — G4733 Obstructive sleep apnea (adult) (pediatric): Secondary | ICD-10-CM

## 2011-10-06 DIAGNOSIS — J45909 Unspecified asthma, uncomplicated: Secondary | ICD-10-CM

## 2011-10-06 DIAGNOSIS — Z96659 Presence of unspecified artificial knee joint: Secondary | ICD-10-CM

## 2011-10-06 DIAGNOSIS — J45998 Other asthma: Secondary | ICD-10-CM

## 2011-10-06 DIAGNOSIS — F329 Major depressive disorder, single episode, unspecified: Secondary | ICD-10-CM

## 2011-10-06 DIAGNOSIS — F3289 Other specified depressive episodes: Secondary | ICD-10-CM

## 2011-10-06 DIAGNOSIS — I10 Essential (primary) hypertension: Secondary | ICD-10-CM

## 2011-10-06 DIAGNOSIS — E669 Obesity, unspecified: Secondary | ICD-10-CM

## 2011-10-06 MED ORDER — CIPROFLOXACIN HCL 500 MG PO TABS: 500.0000 mg | ORAL_TABLET | Freq: Two times a day (BID) | ORAL | Status: AC

## 2011-10-06 MED ORDER — IPRATROPIUM-ALBUTEROL 20-100 MCG/ACT IN AERS: 1.0000 | INHALATION_SPRAY | Freq: Four times a day (QID) | RESPIRATORY_TRACT | Status: DC

## 2011-10-06 NOTE — Progress Notes (Signed)
Subjective:    Patient ID: [name], male    DOB: 1950/06/27, 61 y.o.   MRN: [id number]  HPI Patient comes in today for preventive visit and follow-up of medical issues. Update  history since  last visit: no major changes injury or ed visits . No asthma flares .  continuing med except stopped concerta as doesn't like the way it makes him feel.   Obesity  A problem cause doesn't cook much and lives alone .  Hard to cook for one.    Review of Systems ROS:  GEN/ HEENT: No fever, significant weight changes sweats headaches vision problems hearing changes, CV/ PULM; No chest pain shortness of breath cough, syncope,edema  change in exercise tolerance. GI /GU: No adominal pain, vomiting, change in bowel habits. No blood in the stool. Has some increase urination but no pain or  Hematuria sees Urology reoutine fu for elevated psa.  SKIN/HEME: ,no acute skin rashes suspicious lesions or bleeding. No lymphadenopathy, nodules, masses.  NEURO/ PSYCH:  No neurologic signs such as weakness numbness. No depression anxiety.change  On meds  IMM/ Allergy: No unusual infections.  Allergy .   REST of 12 system review negative except as per HPI  Outpatient Encounter Prescriptions as of 10/06/2011  Medication Sig Dispense Refill  . alfuzosin (UROXATRAL) 10 MG 24 hr tablet Take 10 mg by mouth. After meals       . amLODipine-benazepril (LOTREL) 5-10 MG per capsule TAKE 1 CAPSULE BY MOUTH EVERY DAY  30 capsule  4  . ammonium lactate (AMLACTIN) 12 % cream Use as directed as needed       . aspirin 325 MG EC tablet Take 325 mg by mouth daily.        Marland Kitchen azelastine (ASTELIN) 137 MCG/SPRAY nasal spray USE AS DIRECTED  30 mL  5  . COMBIVENT 18-103 MCG/ACT inhaler 2 PUFFS UP TO FOUR TIMES A DAY AS NEEDED  14.7 g  5  . escitalopram (LEXAPRO) 20 MG tablet TAKE 1 AND A HALF TABLET BY MOUTH DAILY  45 tablet  5  . Eszopiclone 3 MG TABS Take 1 tablet (3 mg total) by mouth at bedtime. Take immediately before bedtime  30  tablet  5  . fexofenadine (ALLEGRA) 180 MG tablet Take 180 mg by mouth daily.        . Fluticasone-Salmeterol (ADVAIR DISKUS) 100-50 MCG/DOSE AEPB Inhale 1 puff into the lungs 2 (two) times daily.  60 each  4  . ibuprofen (ADVIL,MOTRIN) 800 MG tablet TAKE 1 TABLET BY MOUTH EVERY 8 HOURS AS NEEDED  60 tablet  0  . NASONEX 50 MCG/ACT nasal spray USE 1 TO 2 SPRAYS IN EACH NOSTRIL DAILY  1 Inhaler  8  . protriptyline (VIVACTIL) 10 MG tablet TAKE 1 TABLET AT BEDTIME  30 tablet  5  . zafirlukast (ACCOLATE) 20 MG tablet TAKE 1 TABLET TWICE DAILY  60 tablet  4  . DISCONTD: HYDROcodone-acetaminophen (VICODIN) 5-500 MG per tablet Take 1 tablet by mouth every 6 (six) hours as needed.        Marland Kitchen DISCONTD: methylphenidate (CONCERTA) 27 MG CR tablet Take 1 tablet (27 mg total) by mouth daily as needed.  30 tablet  0        Objective:   Physical Exam BP 132/84  Pulse 95  Temp 97.9 F (36.6 C) (Oral)  Ht 5' 4.5" (1.638 m)  Wt 226 lb (102.513 kg)  BMI 38.19 kg/m2  SpO2 98% Physical Exam: Vital signs  reviewed ZOX:WRUE is a well-developed well-nourished alert cooperative  White male  who appears   stated age in no acute distress.  HEENT: normocephalic  traumatic , Eyes: PERRL EOM's full, conjunctiva clear, Nares: patent no discharge or tenderness., Ears: no deformity EAC's clear TMs with normal landmarks. Mouth: clear OP, no lesions, edema.  Moist mucous membranes. Dentition in adequate repair. NECK: supple without masses, thyromegaly or bruits. CHEST/PULM:  Clear to auscultation and percussion breath sounds equal no wheeze , rales or rhonchi. No chest wall deformities or tenderness. CV: PMI is nondisplaced, S1 S2 no gallops, murmurs, rubs. Peripheral pulses are full without delay.No JVD .  ABDOMEN: Bowel sounds normal nontender  No guard or rebound, no hepato splenomegal no CVA tenderness.  Well healed hernia scar . Extremtities:  No clubbing cyanosis , no acute joint swelling or redness no focal atrophy  healed scar knees NEURO:  Oriented x3, cranial nerves 3-12 appear to be intact, no obvious focal weakness,gait within normal limits no abnormal reflexes or asymmetrical SKIN: No acute rashes normal turgor, color, no bruising or petechiae. Toe noils some thickend and  Curved  No ulcers on feeet PSYCH: Oriented, good eye contact, no obvious anxiety  Subdued affect as usual slow word finding  cognition and judgment appear normal. LN:  No cervical axillary or inguinal adenopathy Wt Readings from Last 3 Encounters:  10/06/11 226 lb (102.513 kg)  04/08/11 218 lb (98.884 kg)  02/10/11 216 lb (97.977 kg)     Lab Results  Component Value Date   WBC 8.8 09/29/2011   HGB 13.8 09/29/2011   HCT 41.5 09/29/2011   PLT 149.0* 09/29/2011   GLUCOSE 97 09/29/2011   CHOL 189 09/29/2011   TRIG 82.0 09/29/2011   HDL 40.80 09/29/2011   LDLCALC 132* 09/29/2011   ALT 21 09/29/2011   AST 22 09/29/2011   NA 136 09/29/2011   K 4.0 09/29/2011   CL 103 09/29/2011   CREATININE 0.7 09/29/2011   BUN 14 09/29/2011   CO2 23 09/29/2011   TSH 2.53 09/29/2011   PSA 9.91* 09/29/2011   INR 0.96 06/20/2010       Assessment & Plan:   Preventive Health Care Counseled regarding healthy nutrition, exercise, sleep, injury prevention, calcium vit d and healthy weight . ELEvated PSA   Higher than baseline  emipiric rx for infection and fu with urology  Will send copy to urology o . cipro 500 mg bid for now  OSA per Dr Concha Se the concerta for s/e .  HT  Stable  ASTHMA  No recent flare  Depression  Still in counseling no change  DJD sp knee surgery  Obesity  Some weight gain.  Disc   Poss intervention counseling

## 2011-10-06 NOTE — Progress Notes (Signed)
Subjective:    Patient ID: [name], male    DOB: [birth year], 61 y.o.   MRN: [id number]  HPI 56/1/12- 65 yoM followed for OSA, hx asthma Last here March 01, 2010. 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 No respiratory complications. CPAP doing very well. Likes his new machine. 15 cwp Advanced. Lunesta 3 mg every night. He was off Concerta through surgery and when he restarted it seemed too strong. He has stayed off. Continues Vivactil.   10/06/11- 59 yoM never smoker followed for OSA with hypersomnia, hx asthma Wears CPAP 15/Advanced every night for approximately 8 hours; pressure seems to be working well for patient ; Denies any wheezing, cough, congestion , or SOB at this time-under control. Using Lunesta 3 mg each night. It is worth it to him to self-pay. He also takes Vivactil at night. This was thought to help sleep apnea and depression. It does not seem to keep him awake. He is satisfied to remain off of Concerta but still notices some daytime tiredness. Breathing remains clear using Combivent Respimat one puff 2-3x daily. We gave instruction in technique. Medications reviewed.  ROS-see HPI Constitutional:   No-   weight loss, night sweats, fevers, chills, +fatigue, lassitude. HEENT:   No-  headaches, difficulty swallowing, tooth/dental problems, sore throat,       No-  sneezing, itching, ear ache, nasal congestion, post nasal drip,  CV:  No-   chest pain, orthopnea, PND, swelling in lower extremities, anasarca, dizziness, palpitations Resp: No-   shortness of breath with exertion or at rest.              No-   productive cough,  No non-productive cough,  No- coughing up of blood.              No-   change in color of mucus.  No- wheezing.   Skin: No-   rash or lesions. GI:  No-   heartburn, indigestion, abdominal pain, nausea, vomiting,  GU: n. MS:  No-   joint pain or swelling.   Neuro-     nothing unusual Psych:  No- change in mood or affect. No depression or  anxiety.  No memory loss.  OBJ- Physical Exam General- Alert, Oriented, Affect-appropriate, Distress- none acute, overweight Skin- rash-none, lesions- none, excoriation- none Lymphadenopathy- none Head- atraumatic            Eyes- Gross vision intact, PERRLA, conjunctivae and secretions clear            Ears- Hearing, canals-normal            Nose- Clear, no-Septal dev, mucus, polyps, erosion, perforation             Throat- Mallampati II , mucosa clear , drainage- none, tonsils- atrophic Neck- flexible , trachea midline, no stridor , thyroid nl, carotid no bruit Chest - symmetrical excursion , unlabored           Heart/CV- RRR , no murmur , no gallop  , no rub, nl s1 s2                           - JVD- none , edema- none, stasis changes- none, varices- none           Lung- clear to P&A, wheeze- none, cough- none , dullness-none, rub- none           Chest wall-  Abd-  Br/ Gen/ Rectal- Not done, not  indicated Extrem- cyanosis- none, clubbing, none, atrophy- none, strength- nl Neuro- grossly intact to observation

## 2011-10-06 NOTE — Patient Instructions (Addendum)
Intensify lifestyle interventions. Blood sugar is better . Losing weight would be helpfuls as in the past .   Need to have urology opine about the psa elevated level.   Will treat in case you have mild infection  In the meantime.   ROV in a year   Or as needed.  Flu shot in the fall.

## 2011-10-06 NOTE — Patient Instructions (Addendum)
Continue CPAP 15 / Advanced   Your Combivent has been changed to a Respimat device.   Sample and instruction- 1 puff (not 2 puffs) up to 4 times daily if needed as a rescue inhaler  Please call as needed

## 2011-10-08 NOTE — Assessment & Plan Note (Signed)
We discussed continued use of Lunesta, sleep hygiene and options. This is working for him so we will continue.

## 2011-10-08 NOTE — Assessment & Plan Note (Signed)
Good compliance and control with CPAP. The pressure seems appropriate. He has continued to need supplemental assistance with medications as reviewed but he tolerates them well.

## 2011-10-08 NOTE — Assessment & Plan Note (Signed)
Good control with Advair, Accolate and appropriate use of Combivent. Plan-instruction given in use of Respimat device

## 2011-10-12 DIAGNOSIS — Z0000 Encounter for general adult medical examination without abnormal findings: Secondary | ICD-10-CM | POA: Insufficient documentation

## 2011-10-20 ENCOUNTER — Other Ambulatory Visit: Payer: Self-pay | Admitting: Internal Medicine

## 2011-10-20 NOTE — Telephone Encounter (Signed)
Last seen 10/06/11 (CPE)  Future appt 7/9/4 (cpe).  Please advise.  Thanks.

## 2011-11-27 ENCOUNTER — Other Ambulatory Visit: Payer: Self-pay | Admitting: Internal Medicine

## 2012-01-19 ENCOUNTER — Telehealth: Payer: Self-pay | Admitting: Internal Medicine

## 2012-01-19 MED ORDER — PREDNISONE 20 MG PO TABS: ORAL_TABLET | ORAL | Status: DC

## 2012-01-19 NOTE — Telephone Encounter (Signed)
Per WP, sent in 60,60,40,40,40mg  of prednisone to [facility].  Pt had no fever.  Cough, congestion, sob only sx.  Pt unable to come in due to being unable to pay copay.

## 2012-01-19 NOTE — Telephone Encounter (Signed)
Caller: Clovis Riley Keith/Patient; Patient Name: [name], [name]; PCP: Berniece Andreas [facility]); Best Callback Phone Number: [phone]; Reason for call: Cough/Congestion THE PATIENT REFUSED 911.  Onset 01/05/12 Patient reports he is having problems with his asthma that is flaring up due to allergic issues.  Patient states he needs a Rx for Prednisone.  All emergent symptoms ruled out per Asthma - Adult protocol with exception "new onset or worsening cough and asthma with increasing frequency of flare-ups since last schedule appt."  Per disposition see provider within 4 hours, patient refused appt. at this time and requested Rx called in for Prednisone and call back from office.  PLEASE F/U WITH PATIENT CONCERNING THE ABOVE REQUEST AND/OR APPT.  THANK YOU.

## 2012-01-29 ENCOUNTER — Other Ambulatory Visit: Payer: Self-pay | Admitting: Internal Medicine

## 2012-03-01 ENCOUNTER — Other Ambulatory Visit: Payer: Self-pay | Admitting: Internal Medicine

## 2012-03-30 ENCOUNTER — Other Ambulatory Visit: Payer: Self-pay | Admitting: Internal Medicine

## 2012-03-30 MED ORDER — PROTRIPTYLINE HCL 10 MG PO TABS: 10.0000 mg | ORAL_TABLET | Freq: Every day | ORAL | Status: DC

## 2012-03-30 NOTE — Telephone Encounter (Signed)
Ok to refill his protriptyline for 1 year

## 2012-03-30 NOTE — Telephone Encounter (Signed)
Pharmacy requesting  Protriptyline 10 mg <> take 1 tablet at bedtime #30 X5 Last fill 03-01-12 No Known Allergies Dr Maple Hudson please advise  Thank you

## 2012-03-30 NOTE — Telephone Encounter (Signed)
Rx sent into pharmacy. Nothing further.

## 2012-03-31 HISTORY — PX: OTHER SURGICAL HISTORY: SHX169

## 2012-04-06 ENCOUNTER — Other Ambulatory Visit: Payer: Self-pay | Admitting: Internal Medicine

## 2012-06-08 ENCOUNTER — Other Ambulatory Visit: Payer: Self-pay | Admitting: Internal Medicine

## 2012-07-12 ENCOUNTER — Other Ambulatory Visit: Payer: Self-pay | Admitting: Internal Medicine

## 2012-07-13 NOTE — Telephone Encounter (Signed)
i believe i have been doing this    He sees dr young yearly . rx x 6 months worth

## 2012-07-13 NOTE — Telephone Encounter (Signed)
Are you filling this or Dr. Maple Hudson?

## 2012-07-28 ENCOUNTER — Other Ambulatory Visit: Payer: Self-pay | Admitting: Internal Medicine

## 2012-08-18 ENCOUNTER — Ambulatory Visit (INDEPENDENT_AMBULATORY_CARE_PROVIDER_SITE_OTHER): Payer: BC Managed Care – PPO | Admitting: Internal Medicine

## 2012-08-18 ENCOUNTER — Encounter: Payer: Self-pay | Admitting: Internal Medicine

## 2012-08-18 VITALS — BP 114/74 | HR 96 | Temp 97.8°F | Wt 211.0 lb

## 2012-08-18 DIAGNOSIS — Z96659 Presence of unspecified artificial knee joint: Secondary | ICD-10-CM

## 2012-08-18 DIAGNOSIS — I10 Essential (primary) hypertension: Secondary | ICD-10-CM

## 2012-08-18 DIAGNOSIS — M1990 Unspecified osteoarthritis, unspecified site: Secondary | ICD-10-CM

## 2012-08-18 DIAGNOSIS — J45909 Unspecified asthma, uncomplicated: Secondary | ICD-10-CM

## 2012-08-18 NOTE — Patient Instructions (Signed)
Consider taking your rescue inhaler 30 - 60 minutes pre exercise .  Agree with  Training exercise .     Keep   cpx appt in the summer.

## 2012-08-18 NOTE — Progress Notes (Signed)
Chief Complaint  Patient presents with  . Medical Clearance    HPI: Pt comes in for assessment of medical issues pre exercise program.  Last visit was  7 13    Personal trainer to guide with exercise program at [facility]  For fittness. 6 sessions    For cost .   Has asthma sleep apnea ht  BBB hs of TKR for arthritis   And elevated PSA  Asthma seems stable and getting through pollen season.  advair combivent   As needed .   accolate .  Now  off the vivactil and concerta and lunesta.    Had helped in the past .   Sleeping adequately. Has sleep apnea Did go to the podiatrist was not a fungus using a special topical on his feet has arthritis in 1 toe To go back as needed  Arthro    Shoulder . Right Dr. supple bothers him sometimes but is pretty good with that  He is able to walk a good distance on campus without wheezing chest pain or shortness of breath is not running. ROS: See pertinent positives and negatives per HPI.  Past Medical History  Diagnosis Date  . Asthma   . Rhinosinusitis   . Depression   . Hypertension     nl cardiolite in 1998  . Sleep apnea   . Arthritis     knees  . Elevated PSA     [place] and bx negative 2011  . Pneumonia 2005    right hospitalized     Family History  Problem Relation Age of Onset  . Asthma    . Hypertension    . Diabetes Maternal Grandmother   . Heart failure Father     MV replacement  . Healthy Sister   . Healthy Brother   . Healthy Brother     History   Social History  . Marital Status: Single    Spouse Name: N/A    Number of Children: N/A  . Years of Education: N/A   Occupational History  . 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 Smoking status: Never Smoker   . Smokeless tobacco: None  . Alcohol Use: None     Comment: Quit drinking ETOH 1986  . Drug Use: None  . Sexually Active: None   Other Topics Concern  . 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 ADVAIR DISKUS 100-50 MCG/DOSE AEPB INHALE 1 PUFF INTO THE LUNGS 2 (TWO) TIMES DAILY.  60 each  5  . alfuzosin (UROXATRAL) 10 MG 24 hr tablet Take 10 mg by mouth. After meals       . amLODipine-benazepril (LOTREL) 5-10 MG per capsule TAKE 1 CAPSULE BY MOUTH EVERY DAY  30 capsule  7  . ammonium lactate (AMLACTIN) 12 % cream Use as directed as needed       . aspirin 325 MG EC tablet Take 325 mg by mouth daily.        Marland Kitchen azelastine (ASTELIN) 137 MCG/SPRAY nasal spray USE AS DIRECTED  30 mL  5  . COMBIVENT RESPIMAT 20-100 MCG/ACT AERS respimat INHALE 2 PUFFS UP TO FOUR TIMES A DAY AS NEEDED  4 g  2  . escitalopram (LEXAPRO) 20 MG tablet TAKE 1 & 1/2 TABLET DAILY  45 tablet  5  . fexofenadine (ALLEGRA) 180 MG tablet Take 180 mg by mouth daily.        Marland Kitchen ibuprofen (ADVIL,MOTRIN) 800 MG tablet TAKE 1 TABLET BY MOUTH EVERY 8 HOURS AS NEEDED  60 tablet  0  . NASONEX 50 MCG/ACT nasal spray USE 1 TO 2 SPRAYS IN EACH NOSTRIL DAILY  1 Inhaler  11  . zafirlukast (ACCOLATE) 20 MG tablet TAKE 1 TABLET TWICE DAILY  60 tablet  5  . [DISCONTINUED] Eszopiclone 3 MG TABS Take 1 tablet (3 mg total) by mouth at bedtime. Take immediately before bedtime  30 tablet  5  . [DISCONTINUED] predniSONE (DELTASONE) 20 MG tablet Take 60, 60, 40,40, 40mg   12 tablet  0  . [DISCONTINUED] protriptyline (VIVACTIL) 10 MG tablet Take 1 tablet (10 mg total) by mouth at bedtime.  30 tablet  11   No facility-administered encounter medications on file as of 08/18/2012.    EXAM:  BP 114/74  Pulse 96  Temp(Src) 97.8 F (36.6 C) (Oral)  Wt 211 lb (95.709 kg)  BMI 35.11 kg/m2  SpO2 98%  Body mass index is 35.11 kg/(m^2). Wt Readings from Last 3 Encounters:  08/18/12 211 lb (95.709 kg)  10/06/11 221 lb 12.8 oz (100.608 kg)  10/06/11 226 lb (102.513 kg)    GENERAL:  vitals reviewed and listed above, alert, oriented, appears well hydrated and in no acute distress  HEENT: atraumatic, conjunctiva  clear, no obvious abnormalities on inspection of external nose and ears NECK: no obvious masses on inspection palpation   LUNGS: clear to auscultation bilaterally, no wheezes, rales or rhonchi, good air movement  CV: HRRR, no clubbing cyanosis or  peripheral edema nl cap refill  Abdomen soft without organomegaly he does have diastasis no masses MS: moves all extremities without noticeable focal  abnormality well-healed scars on his knees  PSYCH: pleasant and cooperative, no obvious depression or anxiety  ASSESSMENT AND PLAN:  Discussed the following assessment and plan:  HYPERTENSION  KNEE REPLACEMENT, LEFT, HX OF  Asthma, chronic  DJD (degenerative joint disease) Actually doing pretty well has lost 15-16 pounds since last year appears more animated and motivated discussed exercise can use rescue inhaler preexercise of having a difficulty .  Followup at his preventive visit in July or as needed form signed no restriction specific. As tolerated -Patient advised to return or notify health care team  if symptoms worsen or persist or new concerns arise.  Patient Instructions  Consider taking your rescue inhaler 30 - 60 minutes pre exercise .  Agree with  Training exercise .     Keep   cpx appt in the summer.          [name]. Shady Bradish M.D.

## 2012-08-19 ENCOUNTER — Encounter: Payer: Self-pay | Admitting: Internal Medicine

## 2012-08-20 NOTE — Telephone Encounter (Signed)
Updated med list  i think

## 2012-08-25 ENCOUNTER — Encounter: Payer: Self-pay | Admitting: Internal Medicine

## 2012-09-29 ENCOUNTER — Other Ambulatory Visit (INDEPENDENT_AMBULATORY_CARE_PROVIDER_SITE_OTHER): Payer: BC Managed Care – PPO

## 2012-09-29 DIAGNOSIS — Z0000 Encounter for general adult medical examination without abnormal findings: Secondary | ICD-10-CM

## 2012-09-29 LAB — BASIC METABOLIC PANEL
CO2: 26 mEq/L (ref 19–32)
Calcium: 9.3 mg/dL (ref 8.4–10.5)
Chloride: 103 mEq/L (ref 96–112)
Sodium: 139 mEq/L (ref 135–145)

## 2012-09-29 LAB — CBC WITH DIFFERENTIAL/PLATELET
Basophils Relative: 0.2 % (ref 0.0–3.0)
Eosinophils Absolute: 1.1 10*3/uL — ABNORMAL HIGH (ref 0.0–0.7)
Eosinophils Relative: 14.9 % — ABNORMAL HIGH (ref 0.0–5.0)
Hemoglobin: 13.8 g/dL (ref 13.0–17.0)
Lymphocytes Relative: 15.5 % (ref 12.0–46.0)
MCHC: 34.2 g/dL (ref 30.0–36.0)
Neutro Abs: 4.1 10*3/uL (ref 1.4–7.7)
RBC: 4.04 Mil/uL — ABNORMAL LOW (ref 4.22–5.81)
WBC: 7.3 10*3/uL (ref 4.5–10.5)

## 2012-09-29 LAB — HEPATIC FUNCTION PANEL
ALT: 21 U/L (ref 0–53)
AST: 20 U/L (ref 0–37)
Albumin: 4.4 g/dL (ref 3.5–5.2)
Alkaline Phosphatase: 58 U/L (ref 39–117)
Total Protein: 6.4 g/dL (ref 6.0–8.3)

## 2012-09-29 LAB — LIPID PANEL
Cholesterol: 147 mg/dL (ref 0–200)
LDL Cholesterol: 101 mg/dL — ABNORMAL HIGH (ref 0–99)
Total CHOL/HDL Ratio: 4
VLDL: 10 mg/dL (ref 0.0–40.0)

## 2012-10-05 ENCOUNTER — Ambulatory Visit: Payer: BC Managed Care – PPO | Admitting: Internal Medicine

## 2012-10-06 ENCOUNTER — Ambulatory Visit (INDEPENDENT_AMBULATORY_CARE_PROVIDER_SITE_OTHER): Payer: BC Managed Care – PPO | Admitting: Internal Medicine

## 2012-10-06 ENCOUNTER — Encounter: Payer: Self-pay | Admitting: Internal Medicine

## 2012-10-06 VITALS — BP 112/76 | HR 82 | Temp 97.6°F | Ht 64.25 in | Wt 206.0 lb

## 2012-10-06 DIAGNOSIS — I10 Essential (primary) hypertension: Secondary | ICD-10-CM

## 2012-10-06 DIAGNOSIS — D696 Thrombocytopenia, unspecified: Secondary | ICD-10-CM

## 2012-10-06 DIAGNOSIS — G4700 Insomnia, unspecified: Secondary | ICD-10-CM

## 2012-10-06 DIAGNOSIS — R972 Elevated prostate specific antigen [PSA]: Secondary | ICD-10-CM

## 2012-10-06 DIAGNOSIS — F5104 Psychophysiologic insomnia: Secondary | ICD-10-CM

## 2012-10-06 DIAGNOSIS — Z0000 Encounter for general adult medical examination without abnormal findings: Secondary | ICD-10-CM

## 2012-10-06 DIAGNOSIS — E669 Obesity, unspecified: Secondary | ICD-10-CM

## 2012-10-06 NOTE — Patient Instructions (Addendum)
Continue lifestyle intervention healthy eating and exercise .  Continued  weight loss will help  Your health in future .   Will send result s to   Dr Mena Goes .  The blood count abnormalities could be from allergy and medications . Will follow .   Cbc diff    In 4 months and then ROV>  Can get shingles vaccine  At any time .  Preventive Care for Adults, Male A healthy lifestyle and preventive care can promote health and wellness. Preventive health guidelines for men include the following key practices:  A routine yearly physical is a good way to check with your caregiver about your health and preventative screening. It is a chance to share any concerns and updates on your health, and to receive a thorough exam.  Visit your dentist for a routine exam and preventative care every 6 months. Brush your teeth twice a day and floss once a day. Good oral hygiene prevents tooth decay and gum disease.  The frequency of eye exams is based on your age, health, family medical history, use of contact lenses, and other factors. Follow your caregiver's recommendations for frequency of eye exams.  Eat a healthy diet. Foods like vegetables, fruits, whole grains, low-fat dairy products, and lean protein foods contain the nutrients you need without too many calories. Decrease your intake of foods high in solid fats, added sugars, and salt. Eat the right amount of calories for you.Get information about a proper diet from your caregiver, if necessary.  Regular physical exercise is one of the most important things you can do for your health. Most adults should get at least 150 minutes of moderate-intensity exercise (any activity that increases your heart rate and causes you to sweat) each week. In addition, most adults need muscle-strengthening exercises on 2 or more days a week.  Maintain a healthy weight. The body mass index (BMI) is a screening tool to identify possible weight problems. It provides an estimate  of body fat based on height and weight. Your caregiver can help determine your BMI, and can help you achieve or maintain a healthy weight.For adults 20 years and older:  A BMI below 18.5 is considered underweight.  A BMI of 18.5 to 24.9 is normal.  A BMI of 25 to 29.9 is considered overweight.  A BMI of 30 and above is considered obese.  Maintain normal blood lipids and cholesterol levels by exercising and minimizing your intake of saturated fat. Eat a balanced diet with plenty of fruit and vegetables. Blood tests for lipids and cholesterol should begin at age 17 and be repeated every 5 years. If your lipid or cholesterol levels are high, you are over 50, or you are a high risk for heart disease, you may need your cholesterol levels checked more frequently.Ongoing high lipid and cholesterol levels should be treated with medicines if diet and exercise are not effective.  If you smoke, find out from your caregiver how to quit. If you do not use tobacco, do not start.  If you choose to drink alcohol, do not exceed 2 drinks per day. One drink is considered to be 12 ounces (355 mL) of beer, 5 ounces (148 mL) of wine, or 1.5 ounces (44 mL) of liquor.  Avoid use of street drugs. Do not share needles with anyone. Ask for help if you need support or instructions about stopping the use of drugs.  High blood pressure causes heart disease and increases the risk of stroke. Your blood  pressure should be checked at least every 1 to 2 years. Ongoing high blood pressure should be treated with medicines, if weight loss and exercise are not effective.  If you are 11 to 61 years old, ask your caregiver if you should take aspirin to prevent heart disease.  Diabetes screening involves taking a blood sample to check your fasting blood sugar level. This should be done once every 3 years, after age 38, if you are within normal weight and without risk factors for diabetes. Testing should be considered at a younger age  or be carried out more frequently if you are overweight and have at least 1 risk factor for diabetes.  Colorectal cancer can be detected and often prevented. Most routine colorectal cancer screening begins at the age of 50 and continues through age 71. However, your caregiver may recommend screening at an earlier age if you have risk factors for colon cancer. On a yearly basis, your caregiver may provide home test kits to check for hidden blood in the stool. Use of a small camera at the end of a tube, to directly examine the colon (sigmoidoscopy or colonoscopy), can detect the earliest forms of colorectal cancer. Talk to your caregiver about this at age 43, when routine screening begins. Direct examination of the colon should be repeated every 5 to 10 years through age 37, unless early forms of pre-cancerous polyps or small growths are found.  Hepatitis C blood testing is recommended for all people born from [birth year] through 1965 and any individual with known risks for hepatitis C.  Practice safe sex. Use condoms and avoid high-risk sexual practices to reduce the spread of sexually transmitted infections (STIs). STIs include gonorrhea, chlamydia, syphilis, trichomonas, herpes, HPV, and human immunodeficiency virus (HIV). Herpes, HIV, and HPV are viral illnesses that have no cure. They can result in disability, cancer, and death.  A one-time screening for abdominal aortic aneurysm (AAA) and surgical repair of large AAAs by sound wave imaging (ultrasonography) is recommended for ages 42 to 45 years who are current or former smokers.  Healthy men should no longer receive prostate-specific antigen (PSA) blood tests as part of routine cancer screening. Consult with your caregiver about prostate cancer screening.  Testicular cancer screening is not recommended for adult males who have no symptoms. Screening includes self-exam, caregiver exam, and other screening tests. Consult with your caregiver about any  symptoms you have or any concerns you have about testicular cancer.  Use sunscreen with skin protection factor (SPF) of 30 or more. Apply sunscreen liberally and repeatedly throughout the day. You should seek shade when your shadow is shorter than you. Protect yourself by wearing long sleeves, pants, a wide-brimmed hat, and sunglasses year round, whenever you are outdoors.  Once a month, do a whole body skin exam, using a mirror to look at the skin on your back. Notify your caregiver of new moles, moles that have irregular borders, moles that are larger than a pencil eraser, or moles that have changed in shape or color.  Stay current with required immunizations.  Influenza. You need a dose every fall (or winter). The composition of the flu vaccine changes each year, so being vaccinated once is not enough.  Pneumococcal polysaccharide. You need 1 to 2 doses if you smoke cigarettes or if you have certain chronic medical conditions. You need 1 dose at age 21 (or older) if you have never been vaccinated.  Tetanus, diphtheria, pertussis (Tdap, Td). Get 1 dose of Tdap vaccine if  you are younger than age 72 years, are over 5 and have contact with an infant, are a Research scientist (physical sciences), or simply want to be protected from whooping cough. After that, you need a Td booster dose every 10 years. Consult your caregiver if you have not had at least 3 tetanus and diphtheria-containing shots sometime in your life or have a deep or dirty wound.  HPV. This vaccine is recommended for males 13 through 61 years of age. This vaccine may be given to men 22 through 61 years of age who have not completed the 3 dose series. It is recommended for men through age 69 who have sex with men or whose immune system is weakened because of HIV infection, other illness, or medications. The vaccine is given in 3 doses over 6 months.  Measles, mumps, rubella (MMR). You need at least 1 dose of MMR if you were born in 1957 or later. You may  also need a 2nd dose.  Meningococcal. If you are age 73 to 8 years and a Orthoptist living in a residence hall, or have one of several medical conditions, you need to get vaccinated against meningococcal disease. You may also need additional booster doses.  Zoster (shingles). If you are age 22 years or older, you should get this vaccine.  Varicella (chickenpox). If you have never had chickenpox or you were vaccinated but received only 1 dose, talk to your caregiver to find out if you need this vaccine.  Hepatitis A. You need this vaccine if you have a specific risk factor for hepatitis A virus infection, or you simply wish to be protected from this disease. The vaccine is usually given as 2 doses, 6 to 18 months apart.  Hepatitis B. You need this vaccine if you have a specific risk factor for hepatitis B virus infection or you simply wish to be protected from this disease. The vaccine is given in 3 doses, usually over 6 months. Preventative Service / Frequency Ages 64 to 55  Blood pressure check.** / Every 1 to 2 years.  Lipid and cholesterol check.** / Every 5 years beginning at age 2.  Hepatitis C blood test.** / For any individual with known risks for hepatitis C.  Skin self-exam. / Monthly.  Influenza immunization.** / Every year.  Pneumococcal polysaccharide immunization.** / 1 to 2 doses if you smoke cigarettes or if you have certain chronic medical conditions.  Tetanus, diphtheria, pertussis (Tdap,Td) immunization. / A one-time dose of Tdap vaccine. After that, you need a Td booster dose every 10 years.  HPV immunization. / 3 doses over 6 months, if 26 and younger.  Measles, mumps, rubella (MMR) immunization. / You need at least 1 dose of MMR if you were born in 1957 or later. You may also need a 2nd dose.  Meningococcal immunization. / 1 dose if you are age 8 to 20 years and a Orthoptist living in a residence hall, or have one of several  medical conditions, you need to get vaccinated against meningococcal disease. You may also need additional booster doses.  Varicella immunization.** / Consult your caregiver.  Hepatitis A immunization.** / Consult your caregiver. 2 doses, 6 to 18 months apart.  Hepatitis B immunization.** / Consult your caregiver. 3 doses usually over 6 months. Ages 11 to 15  Blood pressure check.** / Every 1 to 2 years.  Lipid and cholesterol check.** / Every 5 years beginning at age 58.  Fecal occult blood test (FOBT) of stool. /  Every year beginning at age 71 and continuing until age 22. You may not have to do this test if you get colonoscopy every 10 years.  Flexible sigmoidoscopy** or colonoscopy.** / Every 5 years for a flexible sigmoidoscopy or every 10 years for a colonoscopy beginning at age 96 and continuing until age 47.  Hepatitis C blood test.** / For all people born from [birth year] through 1965 and any individual with known risks for hepatitis C.  Skin self-exam. / Monthly.  Influenza immunization.** / Every year.  Pneumococcal polysaccharide immunization.** / 1 to 2 doses if you smoke cigarettes or if you have certain chronic medical conditions.  Tetanus, diphtheria, pertussis (Tdap/Td) immunization.** / A one-time dose of Tdap vaccine. After that, you need a Td booster dose every 10 years.  Measles, mumps, rubella (MMR) immunization. / You need at least 1 dose of MMR if you were born in 1957 or later. You may also need a 2nd dose.  Varicella immunization.**/ Consult your caregiver.  Meningococcal immunization.** / Consult your caregiver.  Hepatitis A immunization.** / Consult your caregiver. 2 doses, 6 to 18 months apart.  Hepatitis B immunization.** / Consult your caregiver. 3 doses, usually over 6 months. Ages 82 and over  Blood pressure check.** / Every 1 to 2 years.  Lipid and cholesterol check.**/ Every 5 years beginning at age 3.  Fecal occult blood test (FOBT) of stool. /  Every year beginning at age 65 and continuing until age 62. You may not have to do this test if you get colonoscopy every 10 years.  Flexible sigmoidoscopy** or colonoscopy.** / Every 5 years for a flexible sigmoidoscopy or every 10 years for a colonoscopy beginning at age 42 and continuing until age 75.  Hepatitis C blood test.** / For all people born from [birth year] through 1965 and any individual with known risks for hepatitis C.  Abdominal aortic aneurysm (AAA) screening.** / A one-time screening for ages 44 to 56 years who are current or former smokers.  Skin self-exam. / Monthly.  Influenza immunization.** / Every year.  Pneumococcal polysaccharide immunization.** / 1 dose at age 37 (or older) if you have never been vaccinated.  Tetanus, diphtheria, pertussis (Tdap, Td) immunization. / A one-time dose of Tdap vaccine if you are over 65 and have contact with an infant, are a Research scientist (physical sciences), or simply want to be protected from whooping cough. After that, you need a Td booster dose every 10 years.  Varicella immunization. ** / Consult your caregiver.  Meningococcal immunization.** / Consult your caregiver.  Hepatitis A immunization. ** / Consult your caregiver. 2 doses, 6 to 18 months apart.  Hepatitis B immunization.** / Check with your caregiver. 3 doses, usually over 6 months. **Family history and personal history of risk and conditions may change your caregiver's recommendations. Document Released: 05/13/2001 Document Revised: 06/09/2011 Document Reviewed: 08/12/2010 [facility] Patient Information 2014 [name], [place].

## 2012-10-06 NOTE — Progress Notes (Signed)
Chief Complaint  Patient presents with  . Annual Exam    HPI: Patient comes in today for Preventive Health Care visit  Since his last visit has done well. Off of the lunesta.   Feels good about this. Personal trainer    Helping exrecise tolerance no asthma flare. Has lost some weight no asthma flare increasing ability for physical activity. Dr Linton Rump. Seeing a regular basis still on Lexapro. Mood is okay No new GU symptoms followed by urology for PSA elevation. Blood pressure controlled. No etoh.   No tobacco  Wt Readings from Last 3 Encounters:  10/06/12 206 lb (93.441 kg)  08/18/12 211 lb (95.709 kg)  10/06/11 221 lb 12.8 oz (100.608 kg)    ROS:  GEN/ HEENT: No fever, significant weight changes sweats headaches vision problems hearing changes, CV/ PULM; No chest pain shortness of breath cough, syncope,edema  change in exercise tolerance. GI /GU: No adominal pain, vomiting, change in bowel habits. No blood in the stool. No significant GU symptoms. SKIN/HEME: ,no acute skin rashes suspicious lesions or bleeding. No lymphadenopathy, nodules, masses.  NEURO/ PSYCH:  No neurologic signs such as weakness numbness. No depression anxiety. IMM/ Allergy: No unusual infections.  Allergy .   REST of 12 system review negative except as per HPI   Past Medical History  Diagnosis Date  . Asthma   . Rhinosinusitis   . Depression   . Hypertension     nl cardiolite in 1998  . Sleep apnea   . Arthritis     knees  . Elevated PSA     [place] and bx negative 2011  . Pneumonia 2005    right hospitalized     Family History  Problem Relation Age of Onset  . Asthma    . Hypertension    . Diabetes Maternal Grandmother   . Heart failure Father     MV replacement  . Healthy Sister   . Healthy Brother   . Healthy Brother    Past Surgical History  Procedure Laterality Date  . Nasal sinus surgery    . Facial reconstruction surgery    . Ventral hernia repair  03-2007    rerepair winter 2010   . Shoulder atrhroscopic surgery      Right  . 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 Marital Status: Single    Spouse Name: N/A    Number of Children: N/A  . Years of Education: N/A   Occupational History  . 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 Smoking status: Never Smoker   . Smokeless tobacco: None  . Alcohol Use: None     Comment: Quit drinking ETOH 1986  . Drug Use: None  . Sexually Active: None   Other Topics Concern  . 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 ADVAIR DISKUS 100-50 MCG/DOSE AEPB INHALE 1 PUFF INTO THE LUNGS 2 (TWO) TIMES DAILY.  60 each  5  . alfuzosin (UROXATRAL) 10 MG 24 hr tablet Take 10 mg by mouth. After meals       . amLODipine-benazepril (LOTREL) 5-10 MG per  capsule TAKE 1 CAPSULE BY MOUTH EVERY DAY  30 capsule  7  . ammonium lactate (AMLACTIN) 12 % cream Use as directed as needed       . aspirin 325 MG EC tablet Take 325 mg by mouth daily.        Marland Kitchen azelastine (ASTELIN) 137 MCG/SPRAY nasal spray USE AS DIRECTED  30 mL  5  . COMBIVENT RESPIMAT 20-100 MCG/ACT AERS respimat INHALE 2 PUFFS UP TO FOUR TIMES A DAY AS NEEDED  4 g  2  . escitalopram (LEXAPRO) 20 MG tablet TAKE 1 & 1/2 TABLET DAILY  45 tablet  5  . fexofenadine (ALLEGRA) 180 MG tablet Take 180 mg by mouth daily.        Marland Kitchen ibuprofen (ADVIL,MOTRIN) 800 MG tablet TAKE 1 TABLET BY MOUTH EVERY 8 HOURS AS NEEDED  60 tablet  0  . NASONEX 50 MCG/ACT nasal spray USE 1 TO 2 SPRAYS IN EACH NOSTRIL DAILY  1 Inhaler  11  . protriptyline (VIVACTIL) 10 MG tablet Take 1 tablet (10 mg total) by mouth at bedtime.  30 tablet  11  . zafirlukast (ACCOLATE) 20 MG tablet TAKE 1 TABLET TWICE DAILY  60 tablet  5   No  facility-administered encounter medications on file as of 10/06/2012.    EXAM:  BP 112/76  Pulse 82  Temp(Src) 97.6 F (36.4 C) (Oral)  Ht 5' 4.25" (1.632 m)  Wt 206 lb (93.441 kg)  BMI 35.08 kg/m2  SpO2 98%  Body mass index is 35.08 kg/(m^2).  Physical Exam: Vital signs reviewed ZOX:WRUE is a well-developed well-nourished alert cooperative   male who appears stated age in no acute distress.  HEENT: normocephalic atraumatic , Eyes: PERRL EOM's full, conjunctiva clear, Nares: paten,t  mild deformity no discharge or tenderness., Ears: no deformity EAC's clear TMs with normal landmarks. Mouth: clear OP, no lesions, edema.  Moist mucous membranes. Dentition in adequate repair. NECK: supple without masses, thyromegaly or bruits. No JVD CHEST/PULM:  Clear to auscultation and percussion breath sounds equal no wheeze , rales or rhonchi. No chest wall deformities or tenderness. CV: PMI is nondisplaced, S1 S2 no gallops, murmurs, rubs. Peripheral pulses are full without delay.No JVD .  ABDOMEN: Bowel sounds normal nontender  No guard or rebound, no hepato splenomegal no CVA tenderness. Does have diastasis  Extremtities:  No clubbing cyanosis or edema, some stasis changes lower extremity no ulcers hair loss no acute joint swelling or redness no focal atrophy well healed knee joints NEURO:  Oriented x3, cranial nerves 3-12 appear to be intact, no obvious focal weakness,gait within normal limits no abnormal reflexes or asymmetrical SKIN: No acute rashes normal turgor, color, no bruising or petechiae. PSYCH: Oriented, good eye contact, no obvious depression anxiety, cognition and judgment appear normal. LN: no cervical axillary inguinal adenopathy  Lab Results  Component Value Date   WBC 7.3 09/29/2012   HGB 13.8 09/29/2012   HCT 40.3 09/29/2012   PLT 134.0 Repeated and verified X2.* 09/29/2012   GLUCOSE 76 09/29/2012   CHOL 147 09/29/2012   TRIG 50.0 09/29/2012   HDL 35.80* 09/29/2012   LDLCALC 101*  09/29/2012   ALT 21 09/29/2012   AST 20 09/29/2012   NA 139 09/29/2012   K 4.0 09/29/2012   CL 103 09/29/2012   CREATININE 0.7 09/29/2012   BUN 10 09/29/2012   CO2 26 09/29/2012   TSH 2.46 09/29/2012   PSA 7.26* 09/29/2012   INR 0.96 06/20/2010    ASSESSMENT  AND PLAN:  Discussed the following assessment and plan:  Visit for preventive health examination  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 Repeat CBC and differential in 3-4 months in followup no sx except asthma controlled  HYPERTENSION  PROSTATE SPECIFIC ANTIGEN, ELEVATED  OBESITY - shoulde be pleased with weight loss and actiovity   Chronic insomnia - good tha he is off medication   osa under rx .   Patient Care Team: Madelin Headings, MD as PCP - General Clent Ridges, MD (Urology) orthopedic Antony Haste, MD as Attending Physician (Urology) Waymon Budge, MD as Attending Physician (Pulmonary Disease) Patient Instructions  Continue lifestyle intervention healthy eating and exercise .  Continued  weight loss will help  Your health in future .   Will send result s to   Dr Mena Goes .  The blood count abnormalities could be from allergy and medications . Will follow .   Cbc diff    In 4 months and then ROV>  Can get shingles vaccine  At any time .  Preventive Care for Adults, Male A healthy lifestyle and preventive care can promote health and wellness. Preventive health guidelines for men include the following key practices:  A routine yearly physical is a good way to check with your caregiver about your health and preventative screening. It is a chance to share any concerns and updates on your health, and to receive a thorough exam.  Visit your dentist for a routine exam and preventative care every 6 months. Brush your teeth twice a day and floss once a day. Good oral hygiene prevents tooth decay and gum disease.  The frequency of eye exams is based on your age, health, family medical history,  use of contact lenses, and other factors. Follow your caregiver's recommendations for frequency of eye exams.  Eat a healthy diet. Foods like vegetables, fruits, whole grains, low-fat dairy products, and lean protein foods contain the nutrients you need without too many calories. Decrease your intake of foods high in solid fats, added sugars, and salt. Eat the right amount of calories for you.Get information about a proper diet from your caregiver, if necessary.  Regular physical exercise is one of the most important things you can do for your health. Most adults should get at least 150 minutes of moderate-intensity exercise (any activity that increases your heart rate and causes you to sweat) each week. In addition, most adults need muscle-strengthening exercises on 2 or more days a week.  Maintain a healthy weight. The body mass index (BMI) is a screening tool to identify possible weight problems. It provides an estimate of body fat based on height and weight. Your caregiver can help determine your BMI, and can help you achieve or maintain a healthy weight.For adults 20 years and older:  A BMI below 18.5 is considered underweight.  A BMI of 18.5 to 24.9 is normal.  A BMI of 25 to 29.9 is considered overweight.  A BMI of 30 and above is considered obese.  Maintain normal blood lipids and cholesterol levels by exercising and minimizing your intake of saturated fat. Eat a balanced diet with plenty of fruit and vegetables. Blood tests for lipids and cholesterol should begin at age 73 and be repeated every 5 years. If your lipid or cholesterol levels are high, you are over 50, or you are a high risk for heart disease, you may need your cholesterol levels checked more frequently.Ongoing high lipid and cholesterol levels should be  treated with medicines if diet and exercise are not effective.  If you smoke, find out from your caregiver how to quit. If you do not use tobacco, do not start.  If you  choose to drink alcohol, do not exceed 2 drinks per day. One drink is considered to be 12 ounces (355 mL) of beer, 5 ounces (148 mL) of wine, or 1.5 ounces (44 mL) of liquor.  Avoid use of street drugs. Do not share needles with anyone. Ask for help if you need support or instructions about stopping the use of drugs.  High blood pressure causes heart disease and increases the risk of stroke. Your blood pressure should be checked at least every 1 to 2 years. Ongoing high blood pressure should be treated with medicines, if weight loss and exercise are not effective.  If you are 59 to 61 years old, ask your caregiver if you should take aspirin to prevent heart disease.  Diabetes screening involves taking a blood sample to check your fasting blood sugar level. This should be done once every 3 years, after age 32, if you are within normal weight and without risk factors for diabetes. Testing should be considered at a younger age or be carried out more frequently if you are overweight and have at least 1 risk factor for diabetes.  Colorectal cancer can be detected and often prevented. Most routine colorectal cancer screening begins at the age of 30 and continues through age 64. However, your caregiver may recommend screening at an earlier age if you have risk factors for colon cancer. On a yearly basis, your caregiver may provide home test kits to check for hidden blood in the stool. Use of a small camera at the end of a tube, to directly examine the colon (sigmoidoscopy or colonoscopy), can detect the earliest forms of colorectal cancer. Talk to your caregiver about this at age 3, when routine screening begins. Direct examination of the colon should be repeated every 5 to 10 years through age 43, unless early forms of pre-cancerous polyps or small growths are found.  Hepatitis C blood testing is recommended for all people born from [birth year] through 1965 and any individual with known risks for hepatitis  C.  Practice safe sex. Use condoms and avoid high-risk sexual practices to reduce the spread of sexually transmitted infections (STIs). STIs include gonorrhea, chlamydia, syphilis, trichomonas, herpes, HPV, and human immunodeficiency virus (HIV). Herpes, HIV, and HPV are viral illnesses that have no cure. They can result in disability, cancer, and death.  A one-time screening for abdominal aortic aneurysm (AAA) and surgical repair of large AAAs by sound wave imaging (ultrasonography) is recommended for ages 83 to 76 years who are current or former smokers.  Healthy men should no longer receive prostate-specific antigen (PSA) blood tests as part of routine cancer screening. Consult with your caregiver about prostate cancer screening.  Testicular cancer screening is not recommended for adult males who have no symptoms. Screening includes self-exam, caregiver exam, and other screening tests. Consult with your caregiver about any symptoms you have or any concerns you have about testicular cancer.  Use sunscreen with skin protection factor (SPF) of 30 or more. Apply sunscreen liberally and repeatedly throughout the day. You should seek shade when your shadow is shorter than you. Protect yourself by wearing long sleeves, pants, a wide-brimmed hat, and sunglasses year round, whenever you are outdoors.  Once a month, do a whole body skin exam, using a mirror to look at the skin  on your back. Notify your caregiver of new moles, moles that have irregular borders, moles that are larger than a pencil eraser, or moles that have changed in shape or color.  Stay current with required immunizations.  Influenza. You need a dose every fall (or winter). The composition of the flu vaccine changes each year, so being vaccinated once is not enough.  Pneumococcal polysaccharide. You need 1 to 2 doses if you smoke cigarettes or if you have certain chronic medical conditions. You need 1 dose at age 13 (or older) if you  have never been vaccinated.  Tetanus, diphtheria, pertussis (Tdap, Td). Get 1 dose of Tdap vaccine if you are younger than age 51 years, are over 30 and have contact with an infant, are a Research scientist (physical sciences), or simply want to be protected from whooping cough. After that, you need a Td booster dose every 10 years. Consult your caregiver if you have not had at least 3 tetanus and diphtheria-containing shots sometime in your life or have a deep or dirty wound.  HPV. This vaccine is recommended for males 13 through 61 years of age. This vaccine may be given to men 22 through 61 years of age who have not completed the 3 dose series. It is recommended for men through age 13 who have sex with men or whose immune system is weakened because of HIV infection, other illness, or medications. The vaccine is given in 3 doses over 6 months.  Measles, mumps, rubella (MMR). You need at least 1 dose of MMR if you were born in 1957 or later. You may also need a 2nd dose.  Meningococcal. If you are age 50 to 53 years and a Orthoptist living in a residence hall, or have one of several medical conditions, you need to get vaccinated against meningococcal disease. You may also need additional booster doses.  Zoster (shingles). If you are age 15 years or older, you should get this vaccine.  Varicella (chickenpox). If you have never had chickenpox or you were vaccinated but received only 1 dose, talk to your caregiver to find out if you need this vaccine.  Hepatitis A. You need this vaccine if you have a specific risk factor for hepatitis A virus infection, or you simply wish to be protected from this disease. The vaccine is usually given as 2 doses, 6 to 18 months apart.  Hepatitis B. You need this vaccine if you have a specific risk factor for hepatitis B virus infection or you simply wish to be protected from this disease. The vaccine is given in 3 doses, usually over 6 months. Preventative Service /  Frequency Ages 37 to 64  Blood pressure check.** / Every 1 to 2 years.  Lipid and cholesterol check.** / Every 5 years beginning at age 65.  Hepatitis C blood test.** / For any individual with known risks for hepatitis C.  Skin self-exam. / Monthly.  Influenza immunization.** / Every year.  Pneumococcal polysaccharide immunization.** / 1 to 2 doses if you smoke cigarettes or if you have certain chronic medical conditions.  Tetanus, diphtheria, pertussis (Tdap,Td) immunization. / A one-time dose of Tdap vaccine. After that, you need a Td booster dose every 10 years.  HPV immunization. / 3 doses over 6 months, if 26 and younger.  Measles, mumps, rubella (MMR) immunization. / You need at least 1 dose of MMR if you were born in 1957 or later. You may also need a 2nd dose.  Meningococcal immunization. / 1  dose if you are age 63 to 33 years and a Orthoptist living in a residence hall, or have one of several medical conditions, you need to get vaccinated against meningococcal disease. You may also need additional booster doses.  Varicella immunization.** / Consult your caregiver.  Hepatitis A immunization.** / Consult your caregiver. 2 doses, 6 to 18 months apart.  Hepatitis B immunization.** / Consult your caregiver. 3 doses usually over 6 months. Ages 39 to 28  Blood pressure check.** / Every 1 to 2 years.  Lipid and cholesterol check.** / Every 5 years beginning at age 53.  Fecal occult blood test (FOBT) of stool. / Every year beginning at age 61 and continuing until age 13. You may not have to do this test if you get colonoscopy every 10 years.  Flexible sigmoidoscopy** or colonoscopy.** / Every 5 years for a flexible sigmoidoscopy or every 10 years for a colonoscopy beginning at age 13 and continuing until age 39.  Hepatitis C blood test.** / For all people born from [birth year] through 1965 and any individual with known risks for hepatitis C.  Skin self-exam. /  Monthly.  Influenza immunization.** / Every year.  Pneumococcal polysaccharide immunization.** / 1 to 2 doses if you smoke cigarettes or if you have certain chronic medical conditions.  Tetanus, diphtheria, pertussis (Tdap/Td) immunization.** / A one-time dose of Tdap vaccine. After that, you need a Td booster dose every 10 years.  Measles, mumps, rubella (MMR) immunization. / You need at least 1 dose of MMR if you were born in 1957 or later. You may also need a 2nd dose.  Varicella immunization.**/ Consult your caregiver.  Meningococcal immunization.** / Consult your caregiver.  Hepatitis A immunization.** / Consult your caregiver. 2 doses, 6 to 18 months apart.  Hepatitis B immunization.** / Consult your caregiver. 3 doses, usually over 6 months. Ages 7 and over  Blood pressure check.** / Every 1 to 2 years.  Lipid and cholesterol check.**/ Every 5 years beginning at age 10.  Fecal occult blood test (FOBT) of stool. / Every year beginning at age 30 and continuing until age 49. You may not have to do this test if you get colonoscopy every 10 years.  Flexible sigmoidoscopy** or colonoscopy.** / Every 5 years for a flexible sigmoidoscopy or every 10 years for a colonoscopy beginning at age 49 and continuing until age 17.  Hepatitis C blood test.** / For all people born from [birth year] through 1965 and any individual with known risks for hepatitis C.  Abdominal aortic aneurysm (AAA) screening.** / A one-time screening for ages 71 to 69 years who are current or former smokers.  Skin self-exam. / Monthly.  Influenza immunization.** / Every year.  Pneumococcal polysaccharide immunization.** / 1 dose at age 70 (or older) if you have never been vaccinated.  Tetanus, diphtheria, pertussis (Tdap, Td) immunization. / A one-time dose of Tdap vaccine if you are over 65 and have contact with an infant, are a Research scientist (physical sciences), or simply want to be protected from whooping cough. After that, you  need a Td booster dose every 10 years.  Varicella immunization. ** / Consult your caregiver.  Meningococcal immunization.** / Consult your caregiver.  Hepatitis A immunization. ** / Consult your caregiver. 2 doses, 6 to 18 months apart.  Hepatitis B immunization.** / Check with your caregiver. 3 doses, usually over 6 months. **Family history and personal history of risk and conditions may change your caregiver's recommendations. Document Released: 05/13/2001 Document Revised: 06/09/2011  Document Reviewed: 08/12/2010 [facility] Patient Information 2014 [place], [place].     [name]. Panosh M.D.  Health Maintenance  Topic Date Due  . Zostavax  09/23/2011  . Influenza Vaccine  11/29/2012  . Colonoscopy  04/01/2015  . Tetanus/tdap  09/18/2019   Health Maintenance Review }

## 2012-10-19 ENCOUNTER — Other Ambulatory Visit: Payer: Self-pay | Admitting: Internal Medicine

## 2012-10-23 ENCOUNTER — Other Ambulatory Visit: Payer: Self-pay | Admitting: Internal Medicine

## 2012-11-14 ENCOUNTER — Other Ambulatory Visit: Payer: Self-pay | Admitting: Internal Medicine

## 2012-11-18 ENCOUNTER — Other Ambulatory Visit: Payer: Self-pay | Admitting: Internal Medicine

## 2012-11-24 ENCOUNTER — Encounter: Payer: Self-pay | Admitting: Gastroenterology

## 2012-12-01 ENCOUNTER — Encounter: Payer: Self-pay | Admitting: Internal Medicine

## 2012-12-01 ENCOUNTER — Ambulatory Visit (INDEPENDENT_AMBULATORY_CARE_PROVIDER_SITE_OTHER): Payer: BC Managed Care – PPO | Admitting: Internal Medicine

## 2012-12-01 VITALS — BP 128/82 | HR 85 | Ht 65.0 in | Wt 209.2 lb

## 2012-12-01 DIAGNOSIS — J45909 Unspecified asthma, uncomplicated: Secondary | ICD-10-CM

## 2012-12-01 DIAGNOSIS — Z23 Encounter for immunization: Secondary | ICD-10-CM

## 2012-12-01 DIAGNOSIS — G4733 Obstructive sleep apnea (adult) (pediatric): Secondary | ICD-10-CM

## 2012-12-01 DIAGNOSIS — J45998 Other asthma: Secondary | ICD-10-CM

## 2012-12-01 NOTE — Progress Notes (Signed)
Subjective:    Patient ID: [name], male    DOB: January 25, 1951, 62 y.o.   MRN: [id number]  HPI 56/1/12- 79 yoM followed for OSA, hx asthma Last here March 01, 2010. 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 No respiratory complications. CPAP doing very well. Likes his new machine. 15 cwp Advanced. Lunesta 3 mg every night. He was off Concerta through surgery and when he restarted it seemed too strong. He has stayed off. Continues Vivactil.   10/06/11- 59 yoM never smoker followed for OSA with hypersomnia, hx asthma Wears CPAP 15/Advanced every night for approximately 8 hours; pressure seems to be working well for patient ; Denies any wheezing, cough, congestion , or SOB at this time-under control. Using Lunesta 3 mg each night. It is worth it to him to self-pay. He also takes Vivactil at night. This was thought to help sleep apnea and depression. It does not seem to keep him awake. He is satisfied to remain off of Concerta but still notices some daytime tiredness. Breathing remains clear using Combivent Respimat one puff 2-3x daily. We gave instruction in technique. Medications reviewed.  12/01/12- 62 yoM never smoker followed for OSA with hypersomnia, hx asthma, complicated by Parkinson's FOLLOWS FOR:  Wearing CPAP 15/ Advanced every night 6-8 hrs.  Improved since last OV. Stopped Concerta. Disliked Lunesta. Exercising with personal trainer and weight down from 221 -209 pounds. Rare need for rescue inhaler Combivent.  ROS-see HPI Constitutional:   No-   weight loss, night sweats, fevers, chills, +fatigue, lassitude. HEENT:   No-  headaches, difficulty swallowing, tooth/dental problems, sore throat,       No-  sneezing, itching, ear ache, nasal congestion, post nasal drip,  CV:  No-   chest pain, orthopnea, PND, swelling in lower extremities, anasarca, dizziness, palpitations Resp: No-   shortness of breath with exertion or at rest.              No-   productive cough,  No  non-productive cough,  No- coughing up of blood.              No-   change in color of mucus.  No- wheezing.   Skin: No-   rash or lesions. GI:  No-   heartburn, indigestion, abdominal pain, nausea, vomiting,  GU: n. MS:  No-   joint pain or swelling.   Neuro-  +early Parkinson's Psych:  No- change in mood or affect. No depression or anxiety.  No memory loss.  OBJ- Physical Exam General- Alert, Oriented, Affect-appropriate, Distress- none acute, overweight Skin- rash-none, lesions- none, excoriation- none Lymphadenopathy- none Head- atraumatic            Eyes- Gross vision intact, PERRLA, conjunctivae and secretions clear            Ears- Hearing, canals-normal            Nose- Clear, no-Septal dev, mucus, polyps, erosion, perforation             Throat- Mallampati II , mucosa clear , drainage- none, tonsils- atrophic Neck- flexible , trachea midline, no stridor , thyroid nl, carotid no bruit Chest - symmetrical excursion , unlabored           Heart/CV- RRR , no murmur , no gallop  , no rub, nl s1 s2                           - JVD- none ,  edema- none, stasis changes- none, varices- none           Lung- clear to P&A, wheeze- none, cough- none , dullness-none, rub- none           Chest wall-  Abd-  Br/ Gen/ Rectal- Not done, not indicated Extrem- cyanosis- none, clubbing, none, atrophy- none, strength- nl Neuro- "masked facies"

## 2012-12-01 NOTE — Patient Instructions (Addendum)
We can continue CPAP 15/ Advanced  Ok to continue Combivent Respimat as needed  Ok to try off the protriptyline and see how you do

## 2012-12-08 NOTE — Assessment & Plan Note (Signed)
Good compliance and control. Sleep quality has improved. Weight loss likely helped.

## 2012-12-08 NOTE — Assessment & Plan Note (Signed)
Occasional use of rescue inhaler is sufficient Plan-flu vaccine

## 2013-02-03 ENCOUNTER — Encounter: Payer: Self-pay | Admitting: Gastroenterology

## 2013-02-08 ENCOUNTER — Other Ambulatory Visit: Payer: BC Managed Care – PPO

## 2013-02-09 ENCOUNTER — Other Ambulatory Visit (INDEPENDENT_AMBULATORY_CARE_PROVIDER_SITE_OTHER): Payer: BC Managed Care – PPO

## 2013-02-09 DIAGNOSIS — D649 Anemia, unspecified: Secondary | ICD-10-CM

## 2013-02-09 LAB — CBC WITH DIFFERENTIAL/PLATELET
Eosinophils Absolute: 0.9 10*3/uL — ABNORMAL HIGH (ref 0.0–0.7)
MCHC: 34.7 g/dL (ref 30.0–36.0)
MCV: 99.1 fl (ref 78.0–100.0)
Monocytes Absolute: 0.8 10*3/uL (ref 0.1–1.0)
Neutrophils Relative %: 63.2 % (ref 43.0–77.0)
Platelets: 135 10*3/uL — ABNORMAL LOW (ref 150.0–400.0)
RDW: 13.9 % (ref 11.5–14.6)

## 2013-02-10 ENCOUNTER — Encounter: Payer: Self-pay | Admitting: Gastroenterology

## 2013-02-10 ENCOUNTER — Ambulatory Visit (AMBULATORY_SURGERY_CENTER): Payer: Self-pay

## 2013-02-10 VITALS — Ht 65.0 in | Wt 192.0 lb

## 2013-02-10 DIAGNOSIS — Z1211 Encounter for screening for malignant neoplasm of colon: Secondary | ICD-10-CM

## 2013-02-10 MED ORDER — MOVIPREP 100 G PO SOLR: 1.0000 | Freq: Once | ORAL | Status: DC

## 2013-02-16 ENCOUNTER — Ambulatory Visit (INDEPENDENT_AMBULATORY_CARE_PROVIDER_SITE_OTHER): Payer: BC Managed Care – PPO | Admitting: Internal Medicine

## 2013-02-16 ENCOUNTER — Encounter: Payer: Self-pay | Admitting: Internal Medicine

## 2013-02-16 VITALS — BP 124/82 | HR 73 | Temp 97.4°F | Wt 199.0 lb

## 2013-02-16 DIAGNOSIS — I10 Essential (primary) hypertension: Secondary | ICD-10-CM

## 2013-02-16 DIAGNOSIS — E669 Obesity, unspecified: Secondary | ICD-10-CM

## 2013-02-16 DIAGNOSIS — D696 Thrombocytopenia, unspecified: Secondary | ICD-10-CM

## 2013-02-16 DIAGNOSIS — M1990 Unspecified osteoarthritis, unspecified site: Secondary | ICD-10-CM

## 2013-02-16 NOTE — Patient Instructions (Signed)
Continue lifestyle intervention healthy eating and exercise . Weight loss should  .  To continue will help all of your medical problems.  Platelet count is borderline low  Will continue to follow   The blood count for now.  Plan preventive visit with labs. Will review to make sure you have had a hep c screen

## 2013-02-16 NOTE — Progress Notes (Signed)
Chief Complaint  Patient presents with  . Follow-up    HPI: Patient comes in today for follow up of  multiple medical problems.  Exercising  2 x per weke and then personal trainer  Once a week at least .   Doing spin class  Asthma stable  No cv sx  ROS: See pertinent positives and negatives per HPI.no excessive bleeding bruising fever aden sweats etc felling better with weight loss and eating different  Past Medical History  Diagnosis Date  . Asthma   . Rhinosinusitis   . Depression   . Hypertension     nl cardiolite in 1998  . Sleep apnea   . Arthritis     knees  . Elevated PSA     [place] and bx negative 2011  . Pneumonia 2005    right hospitalized     Family History  Problem Relation Age of Onset  . Asthma    . Hypertension    . Diabetes Maternal Grandmother   . Heart failure Father     MV replacement  . Healthy Sister   . Healthy Brother   . Healthy Brother   . Colon cancer Neg Hx   . Pancreatic cancer Neg Hx   . Stomach cancer Neg Hx     History   Social History  . Marital Status: Single    Spouse Name: N/A    Number of Children: N/A  . Years of Education: N/A   Occupational History  . 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 Smoking status: Never Smoker   . Smokeless tobacco: Never Used  . Alcohol Use: No     Comment: Quit drinking ETOH 1986  . Drug Use: None  . Sexual Activity: None   Other Topics Concern  . 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 ADVAIR DISKUS 100-50 MCG/DOSE AEPB INHALE 1 PUFF INTO THE LUNGS 2 (TWO) TIMES DAILY.  Marland Kitchen alfuzosin (UROXATRAL) 10 MG 24 hr tablet Take 10 mg by mouth. After meals   . amLODipine-benazepril (LOTREL) 5-10 MG per capsule TAKE 1 CAPSULE BY MOUTH EVERY DAY  . ammonium lactate (AMLACTIN) 12 % cream  Use as directed as needed   . aspirin 325 MG EC tablet Take 325 mg by mouth daily.    Marland Kitchen azelastine (ASTELIN) 137 MCG/SPRAY nasal spray USE AS DIRECTED  . COMBIVENT RESPIMAT 20-100 MCG/ACT AERS respimat INHALE 2 PUFFS UP TO FOUR TIMES A DAY AS NEEDED  . escitalopram (LEXAPRO) 20 MG tablet TAKE 1 & 1/2 TABLET DAILY  . fexofenadine (ALLEGRA) 180 MG tablet Take 180 mg by mouth daily.    Marland Kitchen ibuprofen (ADVIL,MOTRIN) 800 MG tablet TAKE 1 TABLET BY MOUTH EVERY 8 HOURS AS NEEDED  . MOVIPREP 100 G SOLR Take 1 kit (200 g total) by mouth once.  Marland Kitchen NASONEX 50 MCG/ACT nasal spray USE 1 TO 2 SPRAYS IN EACH NOSTRIL DAILY  . protriptyline (VIVACTIL) 10 MG tablet Take 1 tablet (10 mg total) by mouth at bedtime.  . zafirlukast (ACCOLATE) 20 MG tablet TAKE 1 TABLET TWICE DAILY    EXAM:  BP 124/82  Pulse 73  Temp(Src) 97.4 F (36.3 C) (Oral)  Wt 199 lb (90.266 kg)  SpO2 95%  Body mass index is 33.12 kg/(m^2).  GENERAL: vitals reviewed and listed above, alert, oriented, appears well hydrated and in no acute distress HEENT: atraumatic, conjunctiva  clear,  OP : no lesion edema or exudate  NECK: no obvious masses on inspection palpation  LUNGS: clear to auscultation bilaterally, no wheezes, rales or rhonchi, good air movement Abdomen:  Sof,t normal bowel sounds without hepatosplenomegaly, no guarding rebound or masses no CVA tenderness diastesis recti  CV: HRRR, no clubbing cyanosis or  peripheral edema nl cap refill  Skin: normal capillary refill ,turgor , color: No acute rashes ,petechiae or abnormal bruising MS: moves all extremities without noticeable focal  abnormality PSYCH: pleasant and cooperative, no obvious depression or anxiety Lab Results  Component Value Date   WBC 7.1 02/09/2013   HGB 13.2 02/09/2013   HCT 38.0* 02/09/2013   PLT 135.0* 02/09/2013   GLUCOSE 76 09/29/2012   CHOL 147 09/29/2012   TRIG 50.0 09/29/2012   HDL 35.80* 09/29/2012   LDLCALC 101* 09/29/2012   ALT 21 09/29/2012   AST 20  09/29/2012   NA 139 09/29/2012   K 4.0 09/29/2012   CL 103 09/29/2012   CREATININE 0.7 09/29/2012   BUN 10 09/29/2012   CO2 26 09/29/2012   TSH 2.46 09/29/2012   PSA 7.26* 09/29/2012   INR 0.96 06/20/2010   Wt Readings from Last 3 Encounters:  02/16/13 199 lb (90.266 kg)  02/10/13 192 lb (87.091 kg)  12/01/12 209 lb 3.2 oz (94.892 kg)    ASSESSMENT AND PLAN:  Discussed the following assessment and plan:  UNSPECIFIED THROMBOCYTOPENIA - mild with no alarm findings or sx  follow at this time? If has had hep c screen in past?  HYPERTENSION  Obesity, unspecified - continue weight loss  DJD (degenerative joint disease)  -Patient advised to return or notify health care team  if symptoms worsen or persist or new concerns arise.  Patient Instructions  Continue lifestyle intervention healthy eating and exercise . Weight loss should  .  To continue will help all of your medical problems.  Platelet count is borderline low  Will continue to follow   The blood count for now.  Plan preventive visit with labs. Will review to make sure you have had a hep c screen     Burna Mortimer K. Keitra Carusone M.D.  Chief Complaint  Patient presents with  . Follow-up    HPI: Patient comes in today for Preventive Health Care visit  Since his last visit has done well. Off of the lunesta.   Feels good about this. Personal trainer    Helping exrecise tolerance no asthma flare. Has lost some weight no asthma flare increasing ability for physical activity. Dr Linton Rump. Seeing a regular basis still on Lexapro. Mood is okay No new GU symptoms followed by urology for PSA elevation. Blood pressure controlled. No etoh.   No tobacco  Wt Readings from Last 3 Encounters:  02/16/13 199 lb (90.266 kg)  02/10/13 192 lb (87.091 kg)  12/01/12 209 lb 3.2 oz (94.892 kg)    ROS:  GEN/ HEENT: No fever, significant weight changes sweats headaches vision problems hearing changes, CV/ PULM; No chest pain shortness of breath cough,  syncope,edema  change in exercise tolerance. GI /GU: No adominal pain, vomiting, change in bowel habits. No blood in the stool. No significant GU symptoms. SKIN/HEME: ,no acute skin rashes suspicious lesions or bleeding. No lymphadenopathy, nodules, masses.  NEURO/ PSYCH:  No neurologic signs such as weakness  numbness. No depression anxiety. IMM/ Allergy: No unusual infections.  Allergy .   REST of 12 system review negative except as per HPI   Past Medical History  Diagnosis Date  . Asthma   . Rhinosinusitis   . Depression   . Hypertension     nl cardiolite in 1998  . Sleep apnea   . Arthritis     knees  . Elevated PSA     [place] and bx negative 2011  . Pneumonia 2005    right hospitalized     Family History  Problem Relation Age of Onset  . Asthma    . Hypertension    . Diabetes Maternal Grandmother   . Heart failure Father     MV replacement  . Healthy Sister   . Healthy Brother   . Healthy Brother   . Colon cancer Neg Hx   . Pancreatic cancer Neg Hx   . Stomach cancer Neg Hx    Past Surgical History  Procedure Laterality Date  . Nasal sinus surgery    . Facial reconstruction surgery    . Ventral hernia repair  03-2007    rerepair winter 2010  . Shoulder atrhroscopic surgery      Right  . 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 Marital Status: Single    Spouse Name: N/A    Number of Children: N/A  . Years of Education: N/A   Occupational History  . 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 Smoking status: Never Smoker   . Smokeless tobacco: Never Used  . Alcohol Use: No     Comment: Quit drinking ETOH 1986  . Drug Use: None  . Sexual Activity: None   Other Topics Concern  . 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 ADVAIR DISKUS 100-50 MCG/DOSE AEPB INHALE 1 PUFF INTO THE LUNGS 2 (TWO) TIMES DAILY.  Marland Kitchen alfuzosin (UROXATRAL) 10 MG 24 hr tablet Take 10 mg by mouth. After meals   . amLODipine-benazepril (LOTREL) 5-10 MG per capsule TAKE 1 CAPSULE BY MOUTH EVERY DAY  . ammonium lactate (AMLACTIN) 12 % cream Use as directed as needed   . aspirin 325 MG EC tablet Take 325 mg by mouth daily.    Marland Kitchen azelastine (ASTELIN) 137 MCG/SPRAY nasal spray USE AS DIRECTED  . COMBIVENT RESPIMAT 20-100 MCG/ACT AERS respimat INHALE 2 PUFFS UP TO FOUR TIMES A DAY AS NEEDED  . escitalopram (LEXAPRO) 20 MG tablet TAKE 1 & 1/2 TABLET DAILY  . fexofenadine (ALLEGRA) 180 MG tablet Take 180 mg by mouth daily.    Marland Kitchen ibuprofen (ADVIL,MOTRIN) 800 MG tablet TAKE 1 TABLET BY MOUTH EVERY 8 HOURS AS NEEDED  . MOVIPREP 100 G SOLR Take 1 kit (200 g total) by mouth once.  Marland Kitchen NASONEX 50 MCG/ACT nasal spray USE 1 TO 2 SPRAYS IN EACH NOSTRIL DAILY  . protriptyline (VIVACTIL) 10 MG tablet Take 1 tablet (10 mg total) by mouth at bedtime.  . zafirlukast (ACCOLATE) 20 MG tablet TAKE 1 TABLET TWICE DAILY    EXAM:  BP 124/82  Pulse 73  Temp(Src) 97.4 F (  36.3 C) (Oral)  Wt 199 lb (90.266 kg)  SpO2 95%  Body mass index is 33.12 kg/(m^2).  Physical Exam: Vital signs reviewed ONG:EXBM is a well-developed well-nourished alert cooperative   male who appears stated age in no acute distress.  HEENT: normocephalic atraumatic , Eyes: PERRL EOM's full, conjunctiva clear, Nares: paten,t  mild deformity no discharge or tenderness., Ears: no deformity EAC's clear TMs with normal landmarks. Mouth: clear OP, no lesions, edema.  Moist mucous membranes. Dentition in adequate repair. NECK: supple without masses, thyromegaly or bruits. No JVD CHEST/PULM:  Clear to auscultation and percussion breath sounds equal no wheeze , rales or rhonchi. No chest wall deformities or tenderness. CV: PMI is nondisplaced, S1 S2 no gallops,  murmurs, rubs. Peripheral pulses are full without delay.No JVD .  ABDOMEN: Bowel sounds normal nontender  No guard or rebound, no hepato splenomegal no CVA tenderness. Does have diastasis  Extremtities:  No clubbing cyanosis or edema, some stasis changes lower extremity no ulcers hair loss no acute joint swelling or redness no focal atrophy well healed knee joints NEURO:  Oriented x3, cranial nerves 3-12 appear to be intact, no obvious focal weakness,gait within normal limits no abnormal reflexes or asymmetrical SKIN: No acute rashes normal turgor, color, no bruising or petechiae. PSYCH: Oriented, good eye contact, no obvious depression anxiety, cognition and judgment appear normal. LN: no cervical axillary inguinal adenopathy  Lab Results  Component Value Date   WBC 7.1 02/09/2013   HGB 13.2 02/09/2013   HCT 38.0* 02/09/2013   PLT 135.0* 02/09/2013   GLUCOSE 76 09/29/2012   CHOL 147 09/29/2012   TRIG 50.0 09/29/2012   HDL 35.80* 09/29/2012   LDLCALC 101* 09/29/2012   ALT 21 09/29/2012   AST 20 09/29/2012   NA 139 09/29/2012   K 4.0 09/29/2012   CL 103 09/29/2012   CREATININE 0.7 09/29/2012   BUN 10 09/29/2012   CO2 26 09/29/2012   TSH 2.46 09/29/2012   PSA 7.26* 09/29/2012   INR 0.96 06/20/2010    ASSESSMENT AND PLAN:  Discussed the following assessment and plan:  UNSPECIFIED THROMBOCYTOPENIA - mild with no alarm findings or sx  follow at this time? If has had hep c screen in past?  HYPERTENSION  Obesity, unspecified - continue weight loss  DJD (degenerative joint disease)  Patient Care Team: Madelin Headings, MD as PCP - General Clent Ridges, MD (Urology) orthopedic Antony Haste, MD as Attending Physician (Urology) Waymon Budge, MD as Attending Physician (Pulmonary Disease) Patient Instructions  Continue lifestyle intervention healthy eating and exercise . Weight loss should  .  To continue will help all of your medical problems.  Platelet count is borderline low  Will  continue to follow   The blood count for now.  Plan preventive visit with labs. Will review to make sure you have had a hep c screen    Burna Mortimer K. Freddrick Gladson M.D.  Health Maintenance  Topic Date Due  . Zostavax  09/23/2011  . Influenza Vaccine  10/29/2013  . Colonoscopy  04/01/2015  . Tetanus/tdap  09/18/2019   Health Maintenance Review } Pre visit review using our clinic review tool, if applicable. No additional management support is needed unless otherwise documented below in the visit note.

## 2013-02-17 DIAGNOSIS — M1990 Unspecified osteoarthritis, unspecified site: Secondary | ICD-10-CM | POA: Insufficient documentation

## 2013-03-09 ENCOUNTER — Ambulatory Visit (AMBULATORY_SURGERY_CENTER): Payer: BC Managed Care – PPO | Admitting: Gastroenterology

## 2013-03-09 ENCOUNTER — Encounter: Payer: Self-pay | Admitting: Gastroenterology

## 2013-03-09 VITALS — BP 122/75 | HR 72 | Temp 96.3°F | Resp 20 | Ht 65.0 in | Wt 192.0 lb

## 2013-03-09 DIAGNOSIS — D126 Benign neoplasm of colon, unspecified: Secondary | ICD-10-CM

## 2013-03-09 DIAGNOSIS — Z1211 Encounter for screening for malignant neoplasm of colon: Secondary | ICD-10-CM

## 2013-03-09 HISTORY — PX: COLONOSCOPY: SHX174

## 2013-03-09 MED ORDER — SODIUM CHLORIDE 0.9 % IV SOLN: 500.0000 mL | INTRAVENOUS | Status: DC

## 2013-03-09 NOTE — Patient Instructions (Signed)
YOU HAD AN ENDOSCOPIC PROCEDURE TODAY AT THE [facility]: Refer to the procedure report that was given to you for any specific questions about what was found during the examination.  If the procedure report does not answer your questions, please call your gastroenterologist to clarify.  If you requested that your care partner not be given the details of your procedure findings, then the procedure report has been included in a sealed envelope for you to review at your convenience later.  YOU SHOULD EXPECT: Some feelings of bloating in the abdomen. Passage of more gas than usual.  Walking can help get rid of the air that was put into your GI tract during the procedure and reduce the bloating. If you had a lower endoscopy (such as a colonoscopy or flexible sigmoidoscopy) you may notice spotting of blood in your stool or on the toilet paper. If you underwent a bowel prep for your procedure, then you may not have a normal bowel movement for a few days.  DIET: Your first meal following the procedure should be a light meal and then it is ok to progress to your normal diet.  A half-sandwich or bowl of soup is an example of a good first meal.  Heavy or fried foods are harder to digest and may make you feel nauseous or bloated.  Likewise meals heavy in dairy and vegetables can cause extra gas to form and this can also increase the bloating.  Drink plenty of fluids but you should avoid alcoholic beverages for 24 hours.  ACTIVITY: Your care partner should take you home directly after the procedure.  You should plan to take it easy, moving slowly for the rest of the day.  You can resume normal activity the day after the procedure however you should NOT DRIVE or use heavy machinery for 24 hours (because of the sedation medicines used during the test).    SYMPTOMS TO REPORT IMMEDIATELY: A gastroenterologist can be reached at any hour.  During normal business hours, 8:30 AM to 5:00 PM Monday through Friday,  call [phone].  After hours and on weekends, please call the GI answering service at [phone] who will take a message and have the physician on call contact you.   Following lower endoscopy (colonoscopy or flexible sigmoidoscopy):  Excessive amounts of blood in the stool  Significant tenderness or worsening of abdominal pains  Swelling of the abdomen that is new, acute  Fever of 100F or higher  FOLLOW UP: If any biopsies were taken you will be contacted by phone or by letter within the next 1-3 weeks.  Call your gastroenterologist if you have not heard about the biopsies in 3 weeks.  Our staff will call the home number listed on your records the next business day following your procedure to check on you and address any questions or concerns that you may have at that time regarding the information given to you following your procedure. This is a courtesy call and so if there is no answer at the home number and we have not heard from you through the emergency physician on call, we will assume that you have returned to your regular daily activities without incident.  SIGNATURES/CONFIDENTIALITY: You and/or your care partner have signed paperwork which will be entered into your electronic medical record.  These signatures attest to the fact that that the information above on your After Visit Summary has been reviewed and is understood.  Full responsibility of the confidentiality of this   discharge information lies with you and/or your care-partner.  Recommendations See procedure report  

## 2013-03-09 NOTE — Progress Notes (Signed)
Patient did not have preoperative order for IV antibiotic SSI prophylaxis. (G8918)  Patient did not experience any of the following events: a burn prior to discharge; a fall within the facility; wrong site/side/patient/procedure/implant event; or a hospital transfer or hospital admission upon discharge from the facility. (G8907)  

## 2013-03-09 NOTE — Progress Notes (Signed)
Report to pacu rn, vss, bbs=clear 

## 2013-03-09 NOTE — Progress Notes (Signed)
Called to room to assist during endoscopic procedure.  Patient ID and intended procedure confirmed with present staff. Received instructions for my participation in the procedure from the performing physician.  

## 2013-03-09 NOTE — Op Note (Signed)
[facility] [address] [place], [address]   COLONOSCOPY PROCEDURE REPORT  PATIENT: [name], [name]  MR#: [id number] BIRTHDATE: 18-Nov-1950 , 62  yrs. old GENDER: Male ENDOSCOPIST: Mardella Layman, MD, [facility] REFERRED BY: PROCEDURE DATE:  03/09/2013 PROCEDURE:   Colonoscopy with snare polypectomy First Screening Colonoscopy - Avg.  risk and is 50 yrs.  old or older - No.  Prior Negative Screening - Now for repeat screening. N/A  History of Adenoma - Now for follow-up colonoscopy & has been > or = to 3 yrs.  Yes hx of adenoma.  Has been 3 or more years since last colonoscopy.  Polyps Removed Today? Yes. ASA CLASS:   Class III INDICATIONS:Patient's personal history of adenomatous colon polyps.  MEDICATIONS: propofol (Diprivan) 300mg  IV  DESCRIPTION OF PROCEDURE:   After the risks benefits and alternatives of the procedure were thoroughly explained, informed consent was obtained.  A digital rectal exam revealed no abnormalities of the rectum.   The LB [id number]  endoscope was introduced through the anus and advanced to the cecum, which was identified by both the appendix and ileocecal valve. No adverse events experienced.   Limited by a tortuous and redundant colon. Limited by poor preparation.   The quality of the prep was poor, using MoviPrep  The instrument was then slowly withdrawn as the colon was fully examined.      COLON FINDINGS: A smooth sessile polyp ranging between 5-55mm in size was found in the rectum.  A polypectomy was performed with a cold snare.  The resection was complete and the polyp tissue was completely retrieved.   The colon was otherwise normal.  There was no diverticulosis, inflammation, polyps or cancers unless previously stated. VERY POOR PREP !!! the prep was so bad and the colon so tortuous with a large protuberant abdomen, that I cannot exclude small polyps in the residual colon.  This was despite a double prep for  this patient's procedure.[Retroflexion] The time to cecum=9 minutes 35 seconds.  Withdrawal time=9 minutes 21 seconds. The scope was withdrawn and the proceduLUDE e completed. COMPLICATIONS: There were no complications.  ENDOSCOPIC IMPRESSION: 1.   Sessile polyp ranging between 5-21mm in size was found in the rectum; polypectomy was performed with a cold snare 2.   The colon was otherwise normal..incomplete exam per very poor prep.....  RECOMMENDATIONS: 1.  Await biopsy results 2.  Repeat Colonoscopy in 3 years.   eSigned:  Mardella Layman, MD, [facility] 03/09/2013 10:33 AM   cc:   PATIENT NAME:  [name], [name] MR#: [id number]

## 2013-03-09 NOTE — Progress Notes (Signed)
Pt states he has GI issues with eggs- nausea and diarrhea.  Able to eat eggs in other things (i.e. Cakes, etc...).  No respiratory or anaphylactic reactions.  [facility]NA made aware

## 2013-03-10 ENCOUNTER — Telehealth: Payer: Self-pay | Admitting: *Deleted

## 2013-03-10 NOTE — Telephone Encounter (Signed)
  Follow up Call-  Call back number 03/09/2013  Post procedure Call Back phone  # [phone]  Permission to leave phone message Yes     Patient questions:  Do you have a fever, pain , or abdominal swelling? no Pain Score  0 *  Have you tolerated food without any problems? yes  Have you been able to return to your normal activities? yes  Do you have any questions about your discharge instructions: Diet   no Medications  no Follow up visit  no  Do you have questions or concerns about your Care? yes  Actions: * If pain score is 4 or above: No action needed, pain <4.  States he does feel a little bloated but no pain. Instructed him this is normal and to drink warm liquids and getting back to his normal activities should help to pass the remaining air.  Instructed him to call us if he feels he is getting worse.

## 2013-03-15 ENCOUNTER — Encounter: Payer: Self-pay | Admitting: Gastroenterology

## 2013-04-23 ENCOUNTER — Other Ambulatory Visit: Payer: Self-pay | Admitting: Internal Medicine

## 2013-04-26 NOTE — Telephone Encounter (Signed)
Called refill to pharmacy voicemail.  

## 2013-04-26 NOTE — Telephone Encounter (Signed)
Ok to refill x 6 months 

## 2013-04-26 NOTE — Telephone Encounter (Signed)
Last seen 12-01-12; please advise if okay to refill. Thanks.

## 2013-04-28 ENCOUNTER — Other Ambulatory Visit: Payer: Self-pay | Admitting: Internal Medicine

## 2013-05-27 ENCOUNTER — Other Ambulatory Visit: Payer: Self-pay | Admitting: Internal Medicine

## 2013-08-01 ENCOUNTER — Other Ambulatory Visit: Payer: Self-pay | Admitting: Internal Medicine

## 2013-08-01 NOTE — Telephone Encounter (Signed)
Appt in July

## 2013-08-03 NOTE — Telephone Encounter (Signed)
Ok x 2  

## 2013-08-04 NOTE — Telephone Encounter (Signed)
Sent by e-scribe. 

## 2013-08-11 ENCOUNTER — Telehealth (INDEPENDENT_AMBULATORY_CARE_PROVIDER_SITE_OTHER): Payer: Self-pay | Admitting: General Surgery

## 2013-08-11 NOTE — Telephone Encounter (Signed)
Called patient to let him know that it's okay for him to exercise. His surgery was 5 years ago and he will be okay and he will not hurt the mesh

## 2013-08-28 ENCOUNTER — Other Ambulatory Visit: Payer: Self-pay | Admitting: Internal Medicine

## 2013-10-12 ENCOUNTER — Other Ambulatory Visit (INDEPENDENT_AMBULATORY_CARE_PROVIDER_SITE_OTHER): Payer: BC Managed Care – PPO

## 2013-10-12 DIAGNOSIS — Z0000 Encounter for general adult medical examination without abnormal findings: Secondary | ICD-10-CM

## 2013-10-12 LAB — CBC WITH DIFFERENTIAL/PLATELET
Basophils Absolute: 0 10*3/uL (ref 0.0–0.1)
Basophils Relative: 0.3 % (ref 0.0–3.0)
EOS PCT: 12.2 % — AB (ref 0.0–5.0)
Eosinophils Absolute: 0.9 10*3/uL — ABNORMAL HIGH (ref 0.0–0.7)
HCT: 39.1 % (ref 39.0–52.0)
Hemoglobin: 13.2 g/dL (ref 13.0–17.0)
Lymphocytes Relative: 15.2 % (ref 12.0–46.0)
Lymphs Abs: 1.2 10*3/uL (ref 0.7–4.0)
MCHC: 33.8 g/dL (ref 30.0–36.0)
MCV: 101.2 fl — AB (ref 78.0–100.0)
Monocytes Absolute: 0.9 10*3/uL (ref 0.1–1.0)
Monocytes Relative: 11.9 % (ref 3.0–12.0)
Neutro Abs: 4.7 10*3/uL (ref 1.4–7.7)
Neutrophils Relative %: 60.4 % (ref 43.0–77.0)
PLATELETS: 162 10*3/uL (ref 150.0–400.0)
RBC: 3.87 Mil/uL — ABNORMAL LOW (ref 4.22–5.81)
RDW: 14.1 % (ref 11.5–15.5)
WBC: 7.8 10*3/uL (ref 4.0–10.5)

## 2013-10-12 LAB — HEPATIC FUNCTION PANEL
ALT: 23 U/L (ref 0–53)
AST: 23 U/L (ref 0–37)
Albumin: 4.1 g/dL (ref 3.5–5.2)
Alkaline Phosphatase: 55 U/L (ref 39–117)
BILIRUBIN TOTAL: 0.8 mg/dL (ref 0.2–1.2)
Bilirubin, Direct: 0.2 mg/dL (ref 0.0–0.3)
Total Protein: 6.1 g/dL (ref 6.0–8.3)

## 2013-10-12 LAB — BASIC METABOLIC PANEL
BUN: 16 mg/dL (ref 6–23)
CHLORIDE: 102 meq/L (ref 96–112)
CO2: 29 meq/L (ref 19–32)
Calcium: 9.1 mg/dL (ref 8.4–10.5)
Creatinine, Ser: 0.7 mg/dL (ref 0.4–1.5)
GFR: 119.46 mL/min (ref >60.00)
GLUCOSE: 98 mg/dL (ref 70–99)
POTASSIUM: 4.4 meq/L (ref 3.5–5.1)
Sodium: 136 mEq/L (ref 135–145)

## 2013-10-12 LAB — LIPID PANEL
CHOL/HDL RATIO: 3
CHOLESTEROL: 149 mg/dL (ref 0–200)
HDL: 47 mg/dL (ref >39.00)
LDL CALC: 92 mg/dL (ref 0–99)
NonHDL: 102
Triglycerides: 48 mg/dL (ref 0.0–149.0)
VLDL: 9.6 mg/dL (ref 0.0–40.0)

## 2013-10-12 LAB — TSH: TSH: 2.33 u[IU]/mL (ref 0.35–4.50)

## 2013-10-12 LAB — PSA: PSA: 7.43 ng/mL — ABNORMAL HIGH (ref 0.10–4.00)

## 2013-10-19 ENCOUNTER — Encounter: Payer: Self-pay | Admitting: Internal Medicine

## 2013-10-19 ENCOUNTER — Ambulatory Visit (INDEPENDENT_AMBULATORY_CARE_PROVIDER_SITE_OTHER): Payer: BC Managed Care – PPO | Admitting: Internal Medicine

## 2013-10-19 VITALS — BP 126/80 | Temp 98.0°F | Ht 64.0 in | Wt 210.0 lb

## 2013-10-19 DIAGNOSIS — G4733 Obstructive sleep apnea (adult) (pediatric): Secondary | ICD-10-CM

## 2013-10-19 DIAGNOSIS — E669 Obesity, unspecified: Secondary | ICD-10-CM

## 2013-10-19 DIAGNOSIS — Z23 Encounter for immunization: Secondary | ICD-10-CM

## 2013-10-19 DIAGNOSIS — D649 Anemia, unspecified: Secondary | ICD-10-CM

## 2013-10-19 DIAGNOSIS — I10 Essential (primary) hypertension: Secondary | ICD-10-CM

## 2013-10-19 DIAGNOSIS — Z0000 Encounter for general adult medical examination without abnormal findings: Secondary | ICD-10-CM

## 2013-10-19 DIAGNOSIS — Z79899 Other long term (current) drug therapy: Secondary | ICD-10-CM

## 2013-10-19 DIAGNOSIS — J45909 Unspecified asthma, uncomplicated: Secondary | ICD-10-CM

## 2013-10-19 DIAGNOSIS — R972 Elevated prostate specific antigen [PSA]: Secondary | ICD-10-CM

## 2013-10-19 MED ORDER — AMLODIPINE BESY-BENAZEPRIL HCL 5-10 MG PO CAPS: ORAL_CAPSULE | ORAL | Status: DC

## 2013-10-19 MED ORDER — ESCITALOPRAM OXALATE 20 MG PO TABS: ORAL_TABLET | ORAL | Status: DC

## 2013-10-19 NOTE — Progress Notes (Signed)
Chief Complaint  Patient presents with  . Annual Exam    medications  . Hypertension    HPI: Patient comes in today for Preventive Health Care visit  No major change in health status since last visit . Asthma stable  Exercising   And better tolerance   No flares good resp year   Not taking sleep meds now. Has gained some weight back . Eating healthier   psa in March 4.4  And 6 months before . Per dr Junious Silk no sx   Health Maintenance  Topic Date Due  . Zostavax  09/23/2011  . Influenza Vaccine  10/29/2013  . Colonoscopy  03/09/2016  . Tetanus/tdap  09/18/2019   Health Maintenance Review LIFESTYLE:  Exercise:   Yes  Tobacco/ETS:   no Alcohol: none  Sugar beverages: ocass sweet ice tea.  Sleep: cpap  Stopped meds lunestas  Drug use: no Colonoscopy:  utd  Due  In 3-5 years.    ROS:  GEN/ HEENT: No fever, significant weight changes sweats headaches vision problems hearing changes, CV/ PULM; No chest pain shortness of breath cough, syncope,edema  change in exercise tolerance. GI /GU: No adominal pain, vomiting, change in bowel habits. No blood in the stool. No significant GU symptoms. SKIN/HEME: ,no acute skin rashes suspicious lesions or bleeding. No lymphadenopathy, nodules, masses.  NEURO/ PSYCH:  No neurologic signs such as weakness numbness. No depression anxiety. IMM/ Allergy: No unusual infections.  Allergy .   REST of 12 system review negative except as per HPI   Past Medical History  Diagnosis Date  . Asthma   . Rhinosinusitis   . Depression   . Hypertension     nl cardiolite in 1998  . Sleep apnea     wears CPAP  . Arthritis     knees  . Elevated PSA     [place] and bx negative 2011  . Pneumonia 2005    right hospitalized    Past Surgical History  Procedure Laterality Date  . Nasal sinus surgery    . Facial reconstruction surgery    . Ventral hernia repair  03-2007    rerepair winter 2010  . Shoulder atrhroscopic surgery      Right  .  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 Asthma    . Hypertension    . Diabetes Maternal Grandmother   . Heart failure Father     MV replacement  . Healthy Sister   . Healthy Brother   . Healthy Brother   . Colon cancer Neg Hx   . Pancreatic cancer Neg Hx   . Stomach cancer Neg Hx   . Esophageal cancer Neg Hx     History   Social History  . Marital Status: Single    Spouse Name: N/A    Number of Children: N/A  . Years of Education: N/A   Occupational History  . 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 Smoking status: Never Smoker   . Smokeless tobacco: Never Used  . Alcohol Use: No     Comment: Quit drinking ETOH 1986  . Drug Use: None  . Sexual Activity: None   Other Topics Concern  . 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 ADVAIR DISKUS 100-50 MCG/DOSE AEPB INHALE 1 PUFF INTO THE LUNGS 2 (TWO) TIMES DAILY.  Marland Kitchen amLODipine-benazepril (LOTREL) 5-10 MG per capsule TAKE 1 CAPSULE BY MOUTH EVERY DAY  . ammonium lactate (AMLACTIN) 12 % cream Use as directed as needed   . aspirin 325 MG EC tablet Take 325 mg by mouth daily.    Marland Kitchen azelastine (ASTELIN) 137 MCG/SPRAY nasal spray USE AS DIRECTED  . COMBIVENT RESPIMAT 20-100 MCG/ACT AERS respimat INHALE 2 PUFFS UP TO FOUR TIMES A DAY AS NEEDED  . escitalopram (LEXAPRO) 20 MG tablet TAKE 1 & 1/2 TABLET DAILY  . fexofenadine (ALLEGRA) 180 MG tablet Take 180 mg by mouth daily.    Marland Kitchen ibuprofen (ADVIL,MOTRIN) 800 MG tablet TAKE 1 TABLET BY MOUTH EVERY 8 HOURS AS NEEDED  . NASONEX 50 MCG/ACT nasal spray USE 1 TO 2 SPRAYS IN EACH NOSTRIL DAILY  . protriptyline (VIVACTIL) 10 MG tablet TAKE 1 TABLET AT BEDTIME  . zafirlukast (ACCOLATE) 20 MG tablet  TAKE 1 TABLET TWICE DAILY  . [DISCONTINUED] alfuzosin (UROXATRAL) 10 MG 24 hr tablet Take 10 mg by mouth. After meals   . [DISCONTINUED] amLODipine-benazepril (LOTREL) 5-10 MG per capsule TAKE 1 CAPSULE BY MOUTH EVERY DAY  . [DISCONTINUED] escitalopram (LEXAPRO) 20 MG tablet TAKE 1 & 1/2 TABLET DAILY  . RAPAFLO 8 MG CAPS capsule     EXAM:  BP 126/80  Temp(Src) 98 F (36.7 C) (Oral)  Ht 5\' 4"  (1.626 m)  Wt 210 lb (95.255 kg)  BMI 36.03 kg/m2  Body mass index is 36.03 kg/(m^2).  Physical Exam: Vital signs reviewed BJS:EGBT is a well-developed well-nourished alert cooperative    who appearsr stated age in no acute distress.  HEENT: normocephalic atraumatic , Eyes: PERRL EOM's full, conjunctiva clear, Nares: paten,t  Ol deformity  tenderness., Ears: no deformity EAC's clear TMs with normal landmarks. Mouth: clear OP, no lesions, edema.  Moist mucous membranes.NECK: supple without masses, thyromegaly or bruits. CHEST/PULM:  Clear to auscultation and percussion breath sounds equal no wheeze , rales or rhonchi. No chest wall deformities or tenderness. CV: PMI is nondisplaced, S1 S2 no gallops, murmurs, rubs. Peripheral pulses are full without delay.No JVD .  ABDOMEN: Bowel sounds normal nontender  No guard or rebound, no hepato splenomegal no CVA tenderness.  No hernia. Large diastasis  Extremtities:  No clubbing cyanosis or edema, no acute joint swelling or redness no focal atrophy healed scar  Knee  NEURO:  Oriented x3, cranial nerves 3-12 appear to be intact, no obvious focal weakness,gait within normal limits no abnormal reflexes or asymmetrical SKIN: No acute rashes normal turgor, color, no bruising or petechiae.although lower  Leg with some speckled pink red changeschange  . PSYCH: Oriented, good eye contact, no obvious depression anxiety, cognition and judgment appear normal. LN: no cervical axillary inguinal adenopathy Wt Readings from Last 3 Encounters:  10/19/13 210 lb (95.255 kg)    03/09/13 192 lb (87.091 kg)  02/16/13 199 lb (90.266 kg)    Lab Results  Component Value Date   WBC 7.8 10/12/2013   HGB 13.2 10/12/2013   HCT 39.1 10/12/2013   PLT 162.0 10/12/2013   GLUCOSE 98 10/12/2013   CHOL 149 10/12/2013   TRIG 48.0 10/12/2013   HDL 47.00 10/12/2013   LDLCALC 92 10/12/2013   ALT 23 10/12/2013   AST 23 10/12/2013  NA 136 10/12/2013   K 4.4 10/12/2013   CL 102 10/12/2013   CREATININE 0.7 10/12/2013   BUN 16 10/12/2013   CO2 29 10/12/2013   TSH 2.33 10/12/2013   PSA 7.43* 10/12/2013   INR 0.96 06/20/2010    ASSESSMENT AND PLAN:  Discussed the following assessment and plan:  Visit for preventive health examination  HYPERTENSION - controlled refill   Elevated prostate specific antigen (PSA) - our levesl higher than uro office send results 7 rangetoday and last year  4 range per uro office   Obesity, unspecified - gaines some weight back get backon trackk disc   Asthma, chronic, unspecified asthma severity, uncomplicated  Medication management - mood stable continue on the lexapro  alos on asa can contirubute to bleeding but none reporte   Need for prophylactic vaccination against Streptococcus pneumoniae (pneumococcus) - Plan: Pneumococcal conjugate vaccine 13-valent  SLEEP APNEA, OBSTRUCTIVE - under rx . Blood count to follow initially anemic after  Joint surgery and now low normal for males  Higher eos recheck in 6 months consider heme check if ongoing. Patient Care Team: Burnis Medin, MD as PCP - General Orion Crook, MD (Urology) orthopedic Festus Aloe, MD as Attending Physician (Urology) Deneise Lever, MD as Attending Physician (Pulmonary Disease) Patient Instructions  Continue lifestyle intervention healthy eating and exercise . Get you weight back down by tracking .  Healthy lifestyle includes : At least 150 minutes of exercise weeks  , weight at healthy levels, which is usually   BMI 19-25. Avoid trans fats and processed foods;   Increase fresh fruits and veges to 5 servings per day. And avoid sweet beverages including tea and juice. Mediterranean diet with olive oil and nuts have been noted to be heart and brain healthy . Avoid tobacco products . Limit  alcohol to  7 per week for women and 14 servings for men.  Get adequate sleep .  Will send the results of psa to dr E. No change from last year on our labs. Blood count is better but will follow   Plan cbcdiff plt in 6 months and then [place]. Panosh M.D.

## 2013-10-19 NOTE — Progress Notes (Signed)
Pre visit review using our clinic review tool, if applicable. No additional management support is needed unless otherwise documented below in the visit note. 

## 2013-10-19 NOTE — Patient Instructions (Addendum)
Continue lifestyle intervention healthy eating and exercise . Get you weight back down by tracking .  Healthy lifestyle includes : At least 150 minutes of exercise weeks  , weight at healthy levels, which is usually   BMI 19-25. Avoid trans fats and processed foods;  Increase fresh fruits and veges to 5 servings per day. And avoid sweet beverages including tea and juice. Mediterranean diet with olive oil and nuts have been noted to be heart and brain healthy . Avoid tobacco products . Limit  alcohol to  7 per week for women and 14 servings for men.  Get adequate sleep .  Will send the results of psa to dr E. No change from last year on our labs. Blood count is better but will follow   Plan cbcdiff plt in 6 months and then ROV

## 2013-10-20 ENCOUNTER — Telehealth: Payer: Self-pay | Admitting: Internal Medicine

## 2013-10-20 NOTE — Telephone Encounter (Signed)
Relevant patient education assigned to patient using Emmi. ° °

## 2013-11-09 ENCOUNTER — Telehealth: Payer: Self-pay | Admitting: Internal Medicine

## 2013-11-09 MED ORDER — FLUTICASONE-SALMETEROL 100-50 MCG/DOSE IN AEPB: INHALATION_SPRAY | RESPIRATORY_TRACT | Status: DC

## 2013-11-09 MED ORDER — ZAFIRLUKAST 20 MG PO TABS: ORAL_TABLET | ORAL | Status: DC

## 2013-11-09 NOTE — Telephone Encounter (Signed)
[id number] - [place], [place] - [address] is requesting re-fills on the following: zafirlukast (ACCOLATE) 20 MG tablet ADVAIR DISKUS 100-50 MCG/DOSE AEPB

## 2013-11-09 NOTE — Telephone Encounter (Signed)
Sent to the pharmacy by e-scribe.  Pt has a future appt for 04/26/14.

## 2013-11-10 ENCOUNTER — Other Ambulatory Visit: Payer: Self-pay | Admitting: Internal Medicine

## 2013-11-10 NOTE — Telephone Encounter (Signed)
Ok to refill x 1 year 

## 2013-11-10 NOTE — Telephone Encounter (Signed)
Faxed paper refill to pharmacy with CY's signature

## 2013-11-10 NOTE — Telephone Encounter (Signed)
Ok to refill 

## 2013-11-10 NOTE — Telephone Encounter (Signed)
Please advise if okay to refill. Thanks.  

## 2013-12-07 ENCOUNTER — Other Ambulatory Visit: Payer: Self-pay | Admitting: Internal Medicine

## 2013-12-07 NOTE — Telephone Encounter (Signed)
Denied.  Filled for 1 year on 10/19/13 

## 2013-12-16 ENCOUNTER — Ambulatory Visit (INDEPENDENT_AMBULATORY_CARE_PROVIDER_SITE_OTHER): Payer: BC Managed Care – PPO | Admitting: Internal Medicine

## 2013-12-16 ENCOUNTER — Encounter: Payer: Self-pay | Admitting: Internal Medicine

## 2013-12-16 VITALS — BP 122/80 | HR 75 | Ht 65.0 in | Wt 208.6 lb

## 2013-12-16 DIAGNOSIS — G4733 Obstructive sleep apnea (adult) (pediatric): Secondary | ICD-10-CM

## 2013-12-16 DIAGNOSIS — Z23 Encounter for immunization: Secondary | ICD-10-CM

## 2013-12-16 NOTE — Progress Notes (Signed)
Subjective:    Patient ID: [name], male    DOB: 1950/07/17, 63 y.o.   MRN: [id number]  HPI 56/1/12- 55 yoM followed for OSA, hx asthma Last here March 01, 2010. 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 No respiratory complications. CPAP doing very well. Likes his new machine. 15 cwp Advanced. Lunesta 3 mg every night. He was off Concerta through surgery and when he restarted it seemed too strong. He has stayed off. Continues Vivactil.   10/06/11- 48 yoM never smoker followed for OSA with hypersomnia, hx asthma Wears CPAP 15/Advanced every night for approximately 8 hours; pressure seems to be working well for patient ; Denies any wheezing, cough, congestion , or SOB at this time-under control. Using Lunesta 3 mg each night. It is worth it to him to self-pay. He also takes Vivactil at night. This was thought to help sleep apnea and depression. It does not seem to keep him awake. He is satisfied to remain off of Concerta but still notices some daytime tiredness. Breathing remains clear using Combivent Respimat one puff 2-3x daily. We gave instruction in technique. Medications reviewed.  12/01/12- 62 yoM never smoker followed for OSA with hypersomnia, hx asthma, complicated by Parkinson's FOLLOWS FOR:  Wearing CPAP 15/ Advanced every night 6-8 hrs.  Improved since last OV. Stopped Concerta. Disliked Lunesta. Exercising with personal trainer and weight down from 221 -209 pounds. Rare need for rescue inhaler Combivent.  12/16/13- 62 yoM never smoker followed for OSA with hypersomnia, hx asthma,  FOLLOWS FOR: Pt states he is wearing his CPAP 15/ Advanced nightly for 8 hours. Pt denies issues with mask, pressure or machine. pt states he is doing very well overall. Pt just finished amox on 9/17 for root canal.    ROS-see HPI Constitutional:   No-   weight loss, night sweats, fevers, chills, +fatigue, lassitude. HEENT:   No-  headaches, difficulty swallowing, tooth/dental problems, sore  throat,       No-  sneezing, itching, ear ache, nasal congestion, post nasal drip,  CV:  No-   chest pain, orthopnea, PND, swelling in lower extremities, anasarca, dizziness, palpitations Resp: No-   shortness of breath with exertion or at rest.              No-   productive cough,  No non-productive cough,  No- coughing up of blood.              No-   change in color of mucus.  No- wheezing.   Skin: No-   rash or lesions. GI:  No-   heartburn, indigestion, abdominal pain, nausea, vomiting,  GU: n. MS:  No-   joint pain or swelling.   Neuro-  +early Parkinson's Psych:  No- change in mood or affect. No depression or anxiety.  No memory loss.  OBJ- Physical Exam General- Alert, Oriented, Affect-appropriate, Distress- none acute, overweight Skin- rash-none, lesions- none, excoriation- none Lymphadenopathy- none Head- atraumatic            Eyes- Gross vision intact, PERRLA, conjunctivae and secretions clear            Ears- Hearing, canals-normal            Nose- Clear, no-Septal dev, mucus, polyps, erosion, perforation             Throat- Mallampati II , mucosa clear , drainage- none, tonsils- atrophic Neck- flexible , trachea midline, no stridor , thyroid nl, carotid no bruit Chest - symmetrical excursion ,  unlabored           Heart/CV- RRR , no murmur , no gallop  , no rub, nl s1 s2                           - JVD- none , edema- none, stasis changes- none, varices- none           Lung- clear to P&A, wheeze- none, cough- none , dullness-none, rub- none           Chest wall-  Abd-  Br/ Gen/ Rectal- Not done, not indicated Extrem- cyanosis- none, clubbing, none, atrophy- none, strength- nl Neuro- "masked facies"

## 2013-12-16 NOTE — Patient Instructions (Signed)
We can continue CPAP 15/ Advanced  Order- DME Advanced download CPAP for pressure compliance   Flu vax  Please call if we can help

## 2014-04-19 ENCOUNTER — Other Ambulatory Visit: Payer: Self-pay | Admitting: *Deleted

## 2014-04-19 ENCOUNTER — Other Ambulatory Visit (INDEPENDENT_AMBULATORY_CARE_PROVIDER_SITE_OTHER): Payer: BC Managed Care – PPO

## 2014-04-19 DIAGNOSIS — D649 Anemia, unspecified: Secondary | ICD-10-CM

## 2014-04-19 LAB — CBC WITH DIFFERENTIAL/PLATELET
BASOS PCT: 0.3 % (ref 0.0–3.0)
Basophils Absolute: 0 10*3/uL (ref 0.0–0.1)
EOS PCT: 4.8 % (ref 0.0–5.0)
Eosinophils Absolute: 0.7 10*3/uL (ref 0.0–0.7)
HCT: 42.8 % (ref 39.0–52.0)
Hemoglobin: 14.6 g/dL (ref 13.0–17.0)
LYMPHS PCT: 10.7 % — AB (ref 12.0–46.0)
Lymphs Abs: 1.7 10*3/uL (ref 0.7–4.0)
MCHC: 34.1 g/dL (ref 30.0–36.0)
MCV: 99 fl (ref 78.0–100.0)
Monocytes Absolute: 1.6 10*3/uL — ABNORMAL HIGH (ref 0.1–1.0)
Monocytes Relative: 10.6 % (ref 3.0–12.0)
NEUTROS ABS: 11.5 10*3/uL — AB (ref 1.4–7.7)
NEUTROS PCT: 73.6 % (ref 43.0–77.0)
Platelets: 211 10*3/uL (ref 150.0–400.0)
RBC: 4.32 Mil/uL (ref 4.22–5.81)
RDW: 14.1 % (ref 11.5–15.5)
WBC: 15.6 10*3/uL — AB (ref 4.0–10.5)

## 2014-04-26 ENCOUNTER — Encounter: Payer: Self-pay | Admitting: Internal Medicine

## 2014-04-26 ENCOUNTER — Ambulatory Visit (INDEPENDENT_AMBULATORY_CARE_PROVIDER_SITE_OTHER): Payer: BC Managed Care – PPO | Admitting: Internal Medicine

## 2014-04-26 VITALS — BP 120/72 | Temp 98.2°F | Ht 64.0 in | Wt 196.0 lb

## 2014-04-26 DIAGNOSIS — Z6833 Body mass index (BMI) 33.0-33.9, adult: Secondary | ICD-10-CM

## 2014-04-26 DIAGNOSIS — I10 Essential (primary) hypertension: Secondary | ICD-10-CM

## 2014-04-26 DIAGNOSIS — Z79899 Other long term (current) drug therapy: Secondary | ICD-10-CM

## 2014-04-26 DIAGNOSIS — D649 Anemia, unspecified: Secondary | ICD-10-CM

## 2014-04-26 NOTE — Progress Notes (Signed)
Pre visit review using our clinic review tool, if applicable. No additional management support is needed unless otherwise documented below in the visit note.  Chief Complaint  Patient presents with  . Follow-up    HPI: [name] a.ge comes in for fu of Chronic disease management  Ht borderline anemia  tooth extractated tooth early January  amox and  After  And course of prednisone . Asthma doing well.  Fitness . Cycle  Classes.  And personal trainer  Going to gym and still trying to lose weight   exdercises and feels better. ROS: See pertinent positives and negatives per HPI. Has reg baseline hr  90 range  psa down to 6.55  No cp sob more than exercise  Past Medical History  Diagnosis Date  . Asthma   . Rhinosinusitis   . Depression   . Hypertension     nl cardiolite in 1998  . Sleep apnea     wears CPAP  . Arthritis     knees  . Elevated PSA     [place] and bx negative 2011  . Pneumonia 2005    right hospitalized     Family History  Problem Relation Age of Onset  . Asthma    . Hypertension    . Diabetes Maternal Grandmother   . Heart failure Father     MV replacement  . Healthy Sister   . Healthy Brother   . Healthy Brother   . Colon cancer Neg Hx   . Pancreatic cancer Neg Hx   . Stomach cancer Neg Hx   . Esophageal cancer Neg Hx     History   Social History  . Marital Status: Single    Spouse Name: N/A    Number of Children: N/A  . Years of Education: N/A   Occupational History  . 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 Smoking status: Never Smoker   . Smokeless tobacco: Never Used  . Alcohol Use: No     Comment: Quit drinking ETOH 1986  . Drug Use: None  . Sexual Activity: None   Other Topics Concern  . 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 amLODipine-benazepril (LOTREL) 5-10 MG per capsule TAKE 1 CAPSULE BY MOUTH EVERY DAY  . ammonium lactate (AMLACTIN) 12 % cream Use as directed as needed   . aspirin 325 MG EC tablet Take 325 mg by mouth daily.    Marland Kitchen azelastine (ASTELIN) 137 MCG/SPRAY nasal spray USE AS DIRECTED  . COMBIVENT RESPIMAT 20-100 MCG/ACT AERS respimat INHALE 2 PUFFS UP TO FOUR TIMES A DAY AS NEEDED  . escitalopram (LEXAPRO) 20 MG tablet TAKE 1 & 1/2 TABLET DAILY  . fexofenadine (ALLEGRA) 180 MG tablet Take 180 mg by mouth daily.    . Fluticasone-Salmeterol (ADVAIR DISKUS) 100-50 MCG/DOSE AEPB INHALE 1 PUFF INTO THE LUNGS 2 (TWO) TIMES DAILY.  Marland Kitchen ibuprofen (ADVIL,MOTRIN) 800 MG tablet TAKE 1 TABLET BY MOUTH EVERY 8 HOURS AS NEEDED  . NASONEX 50 MCG/ACT nasal spray USE 1 TO 2 SPRAYS IN EACH NOSTRIL DAILY  . protriptyline (VIVACTIL) 10 MG tablet TAKE 1 TABLET BY MOUTH AT BEDTIME  . RAPAFLO 8 MG CAPS capsule   .  zafirlukast (ACCOLATE) 20 MG tablet TAKE 1 TABLET TWICE DAILY    EXAM:  BP 120/72 mmHg  Temp(Src) 98.2 F (36.8 C) (Oral)  Ht 5\' 4"  (1.626 m)  Wt 196 lb (88.905 kg)  BMI 33.63 kg/m2  Body mass index is 33.63 kg/(m^2).  GENERAL: vitals reviewed and listed above, alert, oriented, appears well hydrated and in no acute distress HEENT: atraumatic, conjunctiva  clear, no obvious abnormalities on inspection of external nose and ears NECK: no obvious masses on inspection palpation  LUNGS: clear to auscultation bilaterally, no wheezes, rales or rhonchi, good air movement CV: HRRR, no clubbing cyanosis or  peripheral edema nl cap refill  MS: moves all extremities without noticeable focal  Abnormality well healed knee scars  PSYCH: pleasant and cooperative, no obvious depression or anxiety more energetic and loods well.  Lab Results  Component Value Date   WBC 15.6* 04/19/2014   HGB 14.6 04/19/2014   HCT 42.8 04/19/2014   PLT 211.0 04/19/2014   GLUCOSE 98 10/12/2013   CHOL 149 10/12/2013    TRIG 48.0 10/12/2013   HDL 47.00 10/12/2013   LDLCALC 92 10/12/2013   ALT 23 10/12/2013   AST 23 10/12/2013   NA 136 10/12/2013   K 4.4 10/12/2013   CL 102 10/12/2013   CREATININE 0.7 10/12/2013   BUN 16 10/12/2013   CO2 29 10/12/2013   TSH 2.33 10/12/2013   PSA 7.43* 10/12/2013   INR 0.96 06/20/2010    ASSESSMENT AND PLAN:  Discussed the following assessment and plan:  Medication management  Essential hypertension - excellent control   Borderline anemia - resolved now  elevated  wbc  BMI 33.0-33.9,adult - losing weight lsi continue encouragement  Wt Readings from Last 3 Encounters:  04/26/14 196 lb (88.905 kg)  12/16/13 208 lb 9.6 oz (94.62 kg)  10/19/13 210 lb (95.255 kg)   BP Readings from Last 3 Encounters:  04/26/14 120/72  12/16/13 122/80  10/19/13 126/80     -Patient advised to return or notify health care team  if symptoms worsen ,persist or new concerns arise.  Patient Instructions   Continue lifestyle intervention healthy eating and exercise .! Wt Readings from Last 3 Encounters:  04/26/14 196 lb (88.905 kg)  12/16/13 208 lb 9.6 oz (94.62 kg)  10/19/13 210 lb (95.255 kg)   continuing to track.  Blood count  Is fine today  ( WBC up from the prednisone). Wellness check in 6 months . With  Labs.        Why follow it? Research shows. . Those who follow the Mediterranean diet have a reduced risk of heart disease  . The diet is associated with a reduced incidence of Parkinson's and Alzheimer's diseases . People following the diet may have longer life expectancies and lower rates of chronic diseases  . The Dietary Guidelines for Americans recommends the Mediterranean diet as an eating plan to promote health and prevent disease  What Is the Mediterranean Diet?  . Healthy eating plan based on typical foods and recipes of Mediterranean-style cooking . The diet is primarily a plant based diet; these foods should make up a majority of meals   Starches  - Plant based foods should make up a majority of meals - They are an important sources of vitamins, minerals, energy, antioxidants, and fiber - Choose whole grains, foods high in fiber and minimally processed items  - Typical grain sources include wheat, oats, barley, corn, brown rice, bulgar, farro, millet, polenta, couscous  -  Various types of beans include chickpeas, lentils, fava beans, black beans, white beans   Fruits  Veggies - Large quantities of antioxidant rich fruits & veggies; 6 or more servings  - Vegetables can be eaten raw or lightly drizzled with oil and cooked  - Vegetables common to the traditional Mediterranean Diet include: artichokes, arugula, beets, broccoli, brussel sprouts, cabbage, carrots, celery, collard greens, cucumbers, eggplant, kale, leeks, lemons, lettuce, mushrooms, okra, onions, peas, peppers, potatoes, pumpkin, radishes, rutabaga, shallots, spinach, sweet potatoes, turnips, zucchini - Fruits common to the Mediterranean Diet include: apples, apricots, avocados, cherries, clementines, dates, figs, grapefruits, grapes, melons, nectarines, oranges, peaches, pears, pomegranates, strawberries, tangerines  Fats - Replace butter and margarine with healthy oils, such as olive oil, canola oil, and tahini  - Limit nuts to no more than a handful a day  - Nuts include walnuts, almonds, pecans, pistachios, pine nuts  - Limit or avoid candied, honey roasted or heavily salted nuts - Olives are central to the [employer] - can be eaten whole or used in a variety of dishes   Meats Protein - Limiting red meat: no more than a few times a month - When eating red meat: choose lean cuts and keep the portion to the size of deck of cards - Eggs: approx. 0 to 4 times a week  - Fish and lean poultry: at least 2 a week  - Healthy protein sources include, chicken, [place], lean beef, lamb - Increase intake of seafood such as tuna, salmon, trout, mackerel, shrimp, scallops - Avoid or  limit high fat processed meats such as sausage and bacon  Dairy - Include moderate amounts of low fat dairy products  - Focus on healthy dairy such as fat free yogurt, skim milk, low or reduced fat cheese - Limit dairy products higher in fat such as whole or 2% milk, cheese, ice cream  Alcohol - Moderate amounts of red wine is ok  - No more than 5 oz daily for women (all ages) and men older than age 73  - No more than 10 oz of wine daily for men younger than 10  Other - Limit sweets and other desserts  - Use herbs and spices instead of salt to flavor foods  - Herbs and spices common to the traditional Mediterranean Diet include: basil, bay leaves, chives, cloves, cumin, fennel, garlic, lavender, marjoram, mint, oregano, parsley, pepper, rosemary, sage, savory, sumac, tarragon, thyme   It's not just a diet, it's a lifestyle:  . The Mediterranean diet includes lifestyle factors typical of those in the region  . Foods, drinks and meals are best eaten with others and savored . Daily physical activity is important for overall good health . This could be strenuous exercise like running and aerobics . This could also be more leisurely activities such as walking, housework, yard-work, or taking the stairs . Moderation is the key; a balanced and healthy diet accommodates most foods and drinks . Consider portion sizes and frequency of consumption of certain foods   Meal Ideas & Options:  . Breakfast:  o Whole wheat toast or whole wheat English muffins with peanut butter & hard boiled egg o Steel cut oats topped with apples & cinnamon and skim milk  o Fresh fruit: banana, strawberries, melon, berries, peaches  o Smoothies: strawberries, bananas, greek yogurt, peanut butter o Low fat greek yogurt with blueberries and granola  o Egg white omelet with spinach and mushrooms o Breakfast couscous: whole wheat couscous, apricots, skim milk,  cranberries  . Sandwiches:  o Hummus and grilled vegetables  (peppers, zucchini, squash) on whole wheat bread   o Grilled chicken on whole wheat pita with lettuce, tomatoes, cucumbers or tzatziki  o Tuna salad on whole wheat bread: tuna salad made with greek yogurt, olives, red peppers, capers, green onions o Garlic rosemary lamb pita: lamb sauted with garlic, rosemary, salt & pepper; add lettuce, cucumber, greek yogurt to pita - flavor with lemon juice and black pepper  . Seafood:  o Mediterranean grilled salmon, seasoned with garlic, basil, parsley, lemon juice and black pepper o Shrimp, lemon, and spinach whole-grain pasta salad made with low fat greek yogurt  o Seared scallops with lemon orzo  o Seared tuna steaks seasoned salt, pepper, coriander topped with tomato mixture of olives, tomatoes, olive oil, minced garlic, parsley, green onions and cappers  . Meats:  o Herbed greek chicken salad with kalamata olives, cucumber, feta  o Red bell peppers stuffed with spinach, bulgur, lean ground beef (or lentils) & topped with feta   o Kebabs: skewers of chicken, tomatoes, onions, zucchini, squash  o [place] burgers: made with red onions, mint, dill, lemon juice, feta cheese topped with roasted red peppers . Vegetarian o Cucumber salad: cucumbers, artichoke hearts, celery, red onion, feta cheese, tossed in olive oil & lemon juice  o Hummus and whole grain pita points with a greek salad (lettuce, tomato, feta, olives, cucumbers, red onion) o Lentil soup with celery, carrots made with vegetable broth, garlic, salt and pepper  o Tabouli salad: parsley, bulgur, mint, scallions, cucumbers, tomato, radishes, lemon juice, olive oil, salt and pepper.          Standley Brooking. Raymie Giammarco M.D.

## 2014-04-26 NOTE — Patient Instructions (Addendum)
Continue lifestyle intervention healthy eating and exercise .! Wt Readings from Last 3 Encounters:  04/26/14 196 lb (88.905 kg)  12/16/13 208 lb 9.6 oz (94.62 kg)  10/19/13 210 lb (95.255 kg)   continuing to track.  Blood count  Is fine today  ( WBC up from the prednisone). Wellness check in 6 months . With  Labs.        Why follow it? Research shows. . Those who follow the Mediterranean diet have a reduced risk of heart disease  . The diet is associated with a reduced incidence of Parkinson's and Alzheimer's diseases . People following the diet may have longer life expectancies and lower rates of chronic diseases  . The Dietary Guidelines for Americans recommends the Mediterranean diet as an eating plan to promote health and prevent disease  What Is the Mediterranean Diet?  . Healthy eating plan based on typical foods and recipes of Mediterranean-style cooking . The diet is primarily a plant based diet; these foods should make up a majority of meals   Starches - Plant based foods should make up a majority of meals - They are an important sources of vitamins, minerals, energy, antioxidants, and fiber - Choose whole grains, foods high in fiber and minimally processed items  - Typical grain sources include wheat, oats, barley, corn, brown rice, bulgar, farro, millet, polenta, couscous  - Various types of beans include chickpeas, lentils, fava beans, black beans, white beans   Fruits  Veggies - Large quantities of antioxidant rich fruits & veggies; 6 or more servings  - Vegetables can be eaten raw or lightly drizzled with oil and cooked  - Vegetables common to the traditional Mediterranean Diet include: artichokes, arugula, beets, broccoli, brussel sprouts, cabbage, carrots, celery, collard greens, cucumbers, eggplant, kale, leeks, lemons, lettuce, mushrooms, okra, onions, peas, peppers, potatoes, pumpkin, radishes, rutabaga, shallots, spinach, sweet potatoes, turnips, zucchini - Fruits  common to the Mediterranean Diet include: apples, apricots, avocados, cherries, clementines, dates, figs, grapefruits, grapes, melons, nectarines, oranges, peaches, pears, pomegranates, strawberries, tangerines  Fats - Replace butter and margarine with healthy oils, such as olive oil, canola oil, and tahini  - Limit nuts to no more than a handful a day  - Nuts include walnuts, almonds, pecans, pistachios, pine nuts  - Limit or avoid candied, honey roasted or heavily salted nuts - Olives are central to the [employer] - can be eaten whole or used in a variety of dishes   Meats Protein - Limiting red meat: no more than a few times a month - When eating red meat: choose lean cuts and keep the portion to the size of deck of cards - Eggs: approx. 0 to 4 times a week  - Fish and lean poultry: at least 2 a week  - Healthy protein sources include, chicken, [place], lean beef, lamb - Increase intake of seafood such as tuna, salmon, trout, mackerel, shrimp, scallops - Avoid or limit high fat processed meats such as sausage and bacon  Dairy - Include moderate amounts of low fat dairy products  - Focus on healthy dairy such as fat free yogurt, skim milk, low or reduced fat cheese - Limit dairy products higher in fat such as whole or 2% milk, cheese, ice cream  Alcohol - Moderate amounts of red wine is ok  - No more than 5 oz daily for women (all ages) and men older than age 17  - No more than 10 oz of wine daily for men younger than  65  Other - Limit sweets and other desserts  - Use herbs and spices instead of salt to flavor foods  - Herbs and spices common to the traditional Mediterranean Diet include: basil, bay leaves, chives, cloves, cumin, fennel, garlic, lavender, marjoram, mint, oregano, parsley, pepper, rosemary, sage, savory, sumac, tarragon, thyme   It's not just a diet, it's a lifestyle:  . The Mediterranean diet includes lifestyle factors typical of those in the region  . Foods, drinks  and meals are best eaten with others and savored . Daily physical activity is important for overall good health . This could be strenuous exercise like running and aerobics . This could also be more leisurely activities such as walking, housework, yard-work, or taking the stairs . Moderation is the key; a balanced and healthy diet accommodates most foods and drinks . Consider portion sizes and frequency of consumption of certain foods   Meal Ideas & Options:  . Breakfast:  o Whole wheat toast or whole wheat English muffins with peanut butter & hard boiled egg o Steel cut oats topped with apples & cinnamon and skim milk  o Fresh fruit: banana, strawberries, melon, berries, peaches  o Smoothies: strawberries, bananas, greek yogurt, peanut butter o Low fat greek yogurt with blueberries and granola  o Egg white omelet with spinach and mushrooms o Breakfast couscous: whole wheat couscous, apricots, skim milk, cranberries  . Sandwiches:  o Hummus and grilled vegetables (peppers, zucchini, squash) on whole wheat bread   o Grilled chicken on whole wheat pita with lettuce, tomatoes, cucumbers or tzatziki  o Tuna salad on whole wheat bread: tuna salad made with greek yogurt, olives, red peppers, capers, green onions o Garlic rosemary lamb pita: lamb sauted with garlic, rosemary, salt & pepper; add lettuce, cucumber, greek yogurt to pita - flavor with lemon juice and black pepper  . Seafood:  o Mediterranean grilled salmon, seasoned with garlic, basil, parsley, lemon juice and black pepper o Shrimp, lemon, and spinach whole-grain pasta salad made with low fat greek yogurt  o Seared scallops with lemon orzo  o Seared tuna steaks seasoned salt, pepper, coriander topped with tomato mixture of olives, tomatoes, olive oil, minced garlic, parsley, green onions and cappers  . Meats:  o Herbed greek chicken salad with kalamata olives, cucumber, feta  o Red bell peppers stuffed with spinach, bulgur, lean  ground beef (or lentils) & topped with feta   o Kebabs: skewers of chicken, tomatoes, onions, zucchini, squash  o [place] burgers: made with red onions, mint, dill, lemon juice, feta cheese topped with roasted red peppers . Vegetarian o Cucumber salad: cucumbers, artichoke hearts, celery, red onion, feta cheese, tossed in olive oil & lemon juice  o Hummus and whole grain pita points with a greek salad (lettuce, tomato, feta, olives, cucumbers, red onion) o Lentil soup with celery, carrots made with vegetable broth, garlic, salt and pepper  o Tabouli salad: parsley, bulgur, mint, scallions, cucumbers, tomato, radishes, lemon juice, olive oil, salt and pepper.

## 2014-05-11 ENCOUNTER — Other Ambulatory Visit: Payer: Self-pay | Admitting: Internal Medicine

## 2014-05-12 ENCOUNTER — Other Ambulatory Visit: Payer: Self-pay | Admitting: Internal Medicine

## 2014-05-14 ENCOUNTER — Other Ambulatory Visit: Payer: Self-pay | Admitting: Internal Medicine

## 2014-05-15 NOTE — Telephone Encounter (Signed)
Sent to the pharmacy by e-scribe. 

## 2014-06-11 ENCOUNTER — Other Ambulatory Visit: Payer: Self-pay | Admitting: Internal Medicine

## 2014-06-12 NOTE — Telephone Encounter (Signed)
Last OV 12/16/13 Pending appt 12/20/14 Last refilled 11/10/13 #30 x 5 refills Please advise thanks

## 2014-06-12 NOTE — Telephone Encounter (Signed)
Ok to refill 

## 2014-06-14 ENCOUNTER — Telehealth: Payer: Self-pay | Admitting: Internal Medicine

## 2014-06-14 MED ORDER — PROTRIPTYLINE HCL 10 MG PO TABS: 10.0000 mg | ORAL_TABLET | Freq: Every day | ORAL | Status: DC

## 2014-06-14 NOTE — Telephone Encounter (Signed)
Rx has been sent in. 

## 2014-06-14 NOTE — Telephone Encounter (Signed)
Received refill request for Vivactil 10mg . Pt last seen on 12/16/13 by CY. Last time med was filled was on 11/10/13 for #30 with 5 refills.   CY please advise if ok to fill. Thanks.   Allergies  Allergen Reactions  . Eggs Or Egg-Derived Products     Nausea and diarrhea    Current Outpatient Prescriptions on File Prior to Visit  Medication Sig Dispense Refill  . ADVAIR DISKUS 100-50 MCG/DOSE AEPB INHALE 1 PUFF INTO THE LUNGS 2 (TWO) TIMES DAILY. 60 each 4  . amLODipine-benazepril (LOTREL) 5-10 MG per capsule TAKE 1 CAPSULE BY MOUTH EVERY DAY 30 capsule 12  . ammonium lactate (AMLACTIN) 12 % cream Use as directed as needed     . aspirin 325 MG EC tablet Take 325 mg by mouth daily.      Marland Kitchen azelastine (ASTELIN) 137 MCG/SPRAY nasal spray USE AS DIRECTED 30 mL 5  . COMBIVENT RESPIMAT 20-100 MCG/ACT AERS respimat INHALE 2 PUFFS UP TO FOUR TIMES A DAY AS NEEDED 4 g 1  . escitalopram (LEXAPRO) 20 MG tablet TAKE 1 & 1/2 TABLET DAILY 45 tablet 11  . fexofenadine (ALLEGRA) 180 MG tablet Take 180 mg by mouth daily.      Marland Kitchen ibuprofen (ADVIL,MOTRIN) 800 MG tablet TAKE 1 TABLET BY MOUTH EVERY 8 HOURS AS NEEDED 60 tablet 1  . NASONEX 50 MCG/ACT nasal spray USE 1 TO 2 SPRAYS IN EACH NOSTRIL DAILY 17 g 11  . protriptyline (VIVACTIL) 10 MG tablet TAKE 1 TABLET BY MOUTH AT BEDTIME 30 tablet 5  . RAPAFLO 8 MG CAPS capsule     . zafirlukast (ACCOLATE) 20 MG tablet TAKE 1 TABLET TWICE DAILY 60 tablet 4   No current facility-administered medications on file prior to visit.

## 2014-06-14 NOTE — Telephone Encounter (Signed)
Ok to refill 

## 2014-06-15 ENCOUNTER — Ambulatory Visit (INDEPENDENT_AMBULATORY_CARE_PROVIDER_SITE_OTHER): Payer: BC Managed Care – PPO | Admitting: Family Medicine

## 2014-06-15 ENCOUNTER — Encounter: Payer: Self-pay | Admitting: Family Medicine

## 2014-06-15 ENCOUNTER — Other Ambulatory Visit: Payer: Self-pay | Admitting: Internal Medicine

## 2014-06-15 VITALS — BP 124/70 | HR 99 | Temp 97.4°F | Wt 194.0 lb

## 2014-06-15 DIAGNOSIS — R059 Cough, unspecified: Secondary | ICD-10-CM

## 2014-06-15 DIAGNOSIS — R05 Cough: Secondary | ICD-10-CM

## 2014-06-15 MED ORDER — DOXYCYCLINE HYCLATE 100 MG PO CAPS: 100.0000 mg | ORAL_CAPSULE | Freq: Two times a day (BID) | ORAL | Status: DC

## 2014-06-15 MED ORDER — SILODOSIN 8 MG PO CAPS: 8.0000 mg | ORAL_CAPSULE | Freq: Every day | ORAL | Status: DC

## 2014-06-15 MED ORDER — PREDNISONE 10 MG PO TABS: ORAL_TABLET | ORAL | Status: DC

## 2014-06-15 NOTE — Progress Notes (Signed)
Pre visit review using our clinic review tool, if applicable. No additional management support is needed unless otherwise documented below in the visit note. 

## 2014-06-15 NOTE — Progress Notes (Signed)
   Subjective:    Patient ID: [name], male    DOB: 05/25/1950, 64 y.o.   MRN: [id number]  HPI Acute visit. Patient seen with one-week history of cough. He's had cough productive of yellow sputum intermittently. He has a long-standing history of asthma which has been fairly well controlled. Takes Advair regularly along with Accolate and also Combivent. He is not aware of any active wheezing. He has some shortness of breath with activity. No chest pain. No pleuritic pain. Nonsmoker. Mild body aches  Past Medical History  Diagnosis Date  . Asthma   . Rhinosinusitis   . Depression   . Hypertension     nl cardiolite in 1998  . Sleep apnea     wears CPAP  . Arthritis     knees  . Elevated PSA     [place] and bx negative 2011  . Pneumonia 2005    right hospitalized    Past Surgical History  Procedure Laterality Date  . Nasal sinus surgery    . Facial reconstruction surgery    . Ventral hernia repair  03-2007    rerepair winter 2010  . Shoulder atrhroscopic surgery      Right  . 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 He has never used smokeless tobacco. He reports that he does not drink alcohol. His drug history is not on file. family history includes Asthma in an other family member; Diabetes in his maternal grandmother; Healthy in his brother, brother, and sister; Heart failure in his father; Hypertension in an other family member. There is no history of Colon cancer, Pancreatic cancer, Stomach cancer, or Esophageal cancer. Allergies  Allergen Reactions  . Eggs Or Egg-Derived Products     Nausea and diarrhea      Review of Systems  Constitutional: Positive for fatigue.  Respiratory: Positive for cough. Negative for wheezing.   Cardiovascular: Negative for chest pain.  Gastrointestinal: Negative for nausea and vomiting.  Neurological: Positive for headaches.       Objective:   Physical Exam    Constitutional: He appears well-developed and well-nourished.  HENT:  Right Ear: External ear normal.  Left Ear: External ear normal.  Mouth/Throat: Oropharynx is clear and moist.  Neck: Neck supple.  Cardiovascular: Normal rate and regular rhythm.   Pulmonary/Chest: Effort normal and breath sounds normal. No respiratory distress. He has no wheezes. He has no rales.  Lymphadenopathy:    He has no cervical adenopathy.          Assessment & Plan:  Cough. He has history of asthma but appears to be well controlled. He does not have any respiratory distress at this time. Prescription for doxycycline 100 mg twice a day for 10 days if he has any ongoing productive cough. We also gave prescription for prednisone to start promptly if he has any wheezing. Follow-up for fever or worsening symptoms

## 2014-06-15 NOTE — Telephone Encounter (Signed)
CY authorized #[id number] refill on Protriptyline 10 mg paper request Refill submitted.

## 2014-07-12 ENCOUNTER — Other Ambulatory Visit: Payer: Self-pay | Admitting: Internal Medicine

## 2014-08-01 ENCOUNTER — Telehealth: Payer: Self-pay | Admitting: Internal Medicine

## 2014-08-01 MED ORDER — AMOXICILLIN 500 MG PO CAPS
500.0000 mg | ORAL_CAPSULE | Freq: Three times a day (TID) | ORAL | Status: DC
Start: 2014-08-01 — End: 2014-10-31

## 2014-08-01 MED ORDER — PREDNISONE 20 MG PO TABS: 20.0000 mg | ORAL_TABLET | Freq: Every day | ORAL | Status: DC

## 2014-08-01 NOTE — Telephone Encounter (Signed)
Reviewed pt's chart and saw per Dr. Janee Morn note: Offer prednisone 20 mg, # 5, 1 daily and amoxacillin 500 mg, #21, 1 Three times daily.  And pt was followed up with by Doroteo Glassman, RN.

## 2014-08-01 NOTE — Telephone Encounter (Signed)
Offer prednisone 20 mg, # 5, 1 daily and amoxacillin 500 mg, #21, 1  Three times daily

## 2014-08-01 NOTE — Telephone Encounter (Signed)
Spoke with pt and he still refuses to go to the ER. Offered pt an appointment with another provider and he declines stating he only wants to see Dr. Regis Bill. He says he's been dealing with this since seeing Dr. Elease Hashimoto 06/15/14. He says the prednisone worked a little but did not alleviate sxs completely. I advised pt that Dr. Regis Bill is booked today and tomorrow but I would not suggest that he wait until tomorrow to be seen anyway. I also told him that he will likely need an x-ray and that can be done at the ER but we would have to send him out to have it done. He mentions that he sees Dr. Annamaria Boots at pulmonology and would like to call there for an appointment. I strongly advised pt if he does not get an appointment with Dr. Annamaria Boots then he should either call back here to see another provider or go to the ER or UC. Pt verbalized understanding and will call pulmonology now.

## 2014-08-01 NOTE — Telephone Encounter (Signed)
Spoke with pt and advised of Dr Young's recommendations.  Rx sent to pharmacy. 

## 2014-08-01 NOTE — Telephone Encounter (Signed)
I will check back with pt to ensure that he is seen today at pulmonology, ER, UC or [facility]

## 2014-08-01 NOTE — Telephone Encounter (Signed)
Patient Name: [name]  DOB: 07/04/1950    Initial Comment Caller states s/o SOB, coughing   Nurse Assessment  Nurse: Arnetha Courser, RN, Susie Date/Time (Eastern Time): 08/01/2014 11:02:31 AM  Confirm and document reason for call. If symptomatic, describe symptoms. ---caller states he is short of breathe, and has been seen in the office approximately one month ago but this episode is different; states he does have asthma but this is not the same as that either; he does sound as if he is mildly short of breathe - denies any moistness in this chest - then stated he has yellow mucus. Denied and swelling of extremities; was alert and oriented;  Has the patient traveled out of the country within the last 30 days? ---Not Applicable  Does the patient require triage? ---Yes  Related visit to physician within the last 2 weeks? ---No  Does the PT have any chronic conditions? (i.e. diabetes, asthma, etc.) ---Yes  List chronic conditions. ---asthma, hypertension     Guidelines    Guideline Title Affirmed Question Affirmed Notes  Breathing Difficulty [1] MODERATE difficulty breathing (e.g., speaks in phrases, SOB even at rest, pulse 100-120) AND [2] NEW-onset or WORSE than normal    Final Disposition User   Go to ED [facility], RN, Susie    Comments  Caller stated he did not want to go to the ED but would like to make an appointment to see the MD; discussed with him - he still stated he would like to make an appointment;

## 2014-08-01 NOTE — Telephone Encounter (Signed)
Spoke with pt .  C/o ongoing diff breathing (taking a deep breathe), wheezing, prod cough (yellow) since 05/2014.  Pt seen Dr Elease Hashimoto then and was treated with Doxy and Pred taper but states he did not clear completely.  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  HE called Dr Regis Bill office and they were unable to see him and advised that he go to the ER.  Pt does not want to do that.  Pt sees Dr Annamaria Boots for sleep apnea primarily but would like to see if he recommends anything .  Please advise.  Allergies  Allergen Reactions  . Eggs Or Egg-Derived Products     Nausea and diarrhea    Current Outpatient Prescriptions on File Prior to Visit  Medication Sig Dispense Refill  . ADVAIR DISKUS 100-50 MCG/DOSE AEPB INHALE 1 PUFF INTO THE LUNGS 2 (TWO) TIMES DAILY. 60 each 4  . amLODipine-benazepril (LOTREL) 5-10 MG per capsule TAKE 1 CAPSULE BY MOUTH EVERY DAY 30 capsule 12  . ammonium lactate (AMLACTIN) 12 % cream Use as directed as needed     . aspirin 325 MG EC tablet Take 325 mg by mouth daily.      Marland Kitchen azelastine (ASTELIN) 137 MCG/SPRAY nasal spray USE AS DIRECTED 30 mL 5  . COMBIVENT RESPIMAT 20-100 MCG/ACT AERS respimat INHALE 2 PUFFS UP TO FOUR TIMES A DAY AS NEEDED 4 g 3  . doxycycline (VIBRAMYCIN) 100 MG capsule Take 1 capsule (100 mg total) by mouth 2 (two) times daily. 20 capsule 0  . escitalopram (LEXAPRO) 20 MG tablet TAKE 1 & 1/2 TABLET DAILY 45 tablet 11  . fexofenadine (ALLEGRA) 180 MG tablet Take 180 mg by mouth daily.      Marland Kitchen ibuprofen (ADVIL,MOTRIN) 800 MG tablet TAKE 1 TABLET BY MOUTH EVERY 8 HOURS AS NEEDED 60 tablet 1  . NASONEX 50 MCG/ACT nasal spray USE 1 TO 2 SPRAYS IN EACH NOSTRIL DAILY 17 g 11  . predniSONE (DELTASONE) 10 MG tablet Taper as follows: 4-4-4-3-3-2-2 22 tablet 0  . protriptyline (VIVACTIL) 10 MG tablet Take 1 tablet (10 mg total) by mouth at bedtime. 30 tablet 5  . silodosin (RAPAFLO) 8 MG CAPS capsule Take 1 capsule (8 mg total) by mouth at bedtime. 30 capsule 11  .  zafirlukast (ACCOLATE) 20 MG tablet TAKE 1 TABLET TWICE DAILY 60 tablet 4   No current facility-administered medications on file prior to visit.

## 2014-08-04 ENCOUNTER — Encounter: Payer: Self-pay | Admitting: Gastroenterology

## 2014-10-08 ENCOUNTER — Other Ambulatory Visit: Payer: Self-pay | Admitting: Internal Medicine

## 2014-10-09 MED ORDER — ZAFIRLUKAST 20 MG PO TABS: 20.0000 mg | ORAL_TABLET | Freq: Two times a day (BID) | ORAL | Status: DC

## 2014-10-09 NOTE — Addendum Note (Signed)
Addended by: Miles Costain T on: 10/09/2014 04:27 PM   Modules accepted: Orders

## 2014-10-09 NOTE — Telephone Encounter (Signed)
Sent to the pharmacy by e-scribe. 

## 2014-10-24 ENCOUNTER — Other Ambulatory Visit (INDEPENDENT_AMBULATORY_CARE_PROVIDER_SITE_OTHER): Payer: BC Managed Care – PPO

## 2014-10-24 DIAGNOSIS — Z0000 Encounter for general adult medical examination without abnormal findings: Secondary | ICD-10-CM | POA: Diagnosis not present

## 2014-10-24 LAB — CBC WITH DIFFERENTIAL/PLATELET
BASOS ABS: 0 10*3/uL (ref 0.0–0.1)
BASOS PCT: 0.4 % (ref 0.0–3.0)
EOS ABS: 0.8 10*3/uL — AB (ref 0.0–0.7)
Eosinophils Relative: 10.5 % — ABNORMAL HIGH (ref 0.0–5.0)
HCT: 39.3 % (ref 39.0–52.0)
Hemoglobin: 13.3 g/dL (ref 13.0–17.0)
LYMPHS ABS: 1 10*3/uL (ref 0.7–4.0)
LYMPHS PCT: 14.2 % (ref 12.0–46.0)
MCHC: 33.8 g/dL (ref 30.0–36.0)
MCV: 98.3 fl (ref 78.0–100.0)
MONOS PCT: 10.2 % (ref 3.0–12.0)
Monocytes Absolute: 0.7 10*3/uL (ref 0.1–1.0)
NEUTROS ABS: 4.7 10*3/uL (ref 1.4–7.7)
Neutrophils Relative %: 64.7 % (ref 43.0–77.0)
PLATELETS: 149 10*3/uL — AB (ref 150.0–400.0)
RBC: 4 Mil/uL — AB (ref 4.22–5.81)
RDW: 14.9 % (ref 11.5–15.5)
WBC: 7.3 10*3/uL (ref 4.0–10.5)

## 2014-10-24 LAB — PSA: PSA: 6.19 ng/mL — ABNORMAL HIGH (ref 0.10–4.00)

## 2014-10-24 LAB — BASIC METABOLIC PANEL
BUN: 14 mg/dL (ref 6–23)
CALCIUM: 9.5 mg/dL (ref 8.4–10.5)
CO2: 29 mEq/L (ref 19–32)
Chloride: 101 mEq/L (ref 96–112)
Creatinine, Ser: 0.7 mg/dL (ref 0.40–1.50)
GFR: 121.03 mL/min (ref >60.00)
Glucose, Bld: 91 mg/dL (ref 70–99)
Potassium: 4.5 mEq/L (ref 3.5–5.1)
Sodium: 138 mEq/L (ref 135–145)

## 2014-10-24 LAB — LIPID PANEL
CHOLESTEROL: 146 mg/dL (ref 0–200)
HDL: 46.5 mg/dL (ref >39.00)
LDL CALC: 90 mg/dL (ref 0–99)
NONHDL: 99.5
Total CHOL/HDL Ratio: 3
Triglycerides: 46 mg/dL (ref 0.0–149.0)
VLDL: 9.2 mg/dL (ref 0.0–40.0)

## 2014-10-24 LAB — HEPATIC FUNCTION PANEL
ALBUMIN: 4.5 g/dL (ref 3.5–5.2)
ALK PHOS: 52 U/L (ref 39–117)
ALT: 13 U/L (ref 0–53)
AST: 15 U/L (ref 0–37)
BILIRUBIN DIRECT: 0.2 mg/dL (ref 0.0–0.3)
TOTAL PROTEIN: 6.5 g/dL (ref 6.0–8.3)
Total Bilirubin: 0.9 mg/dL (ref 0.2–1.2)

## 2014-10-24 LAB — TSH: TSH: 2.62 u[IU]/mL (ref 0.35–4.50)

## 2014-10-31 ENCOUNTER — Encounter: Payer: Self-pay | Admitting: Internal Medicine

## 2014-10-31 ENCOUNTER — Ambulatory Visit (INDEPENDENT_AMBULATORY_CARE_PROVIDER_SITE_OTHER): Payer: BC Managed Care – PPO | Admitting: Internal Medicine

## 2014-10-31 VITALS — BP 122/76 | Temp 97.9°F | Ht 63.0 in | Wt 184.3 lb

## 2014-10-31 DIAGNOSIS — D696 Thrombocytopenia, unspecified: Secondary | ICD-10-CM

## 2014-10-31 DIAGNOSIS — D721 Eosinophilia, unspecified: Secondary | ICD-10-CM

## 2014-10-31 DIAGNOSIS — Z0000 Encounter for general adult medical examination without abnormal findings: Secondary | ICD-10-CM

## 2014-10-31 DIAGNOSIS — Z79899 Other long term (current) drug therapy: Secondary | ICD-10-CM

## 2014-10-31 DIAGNOSIS — G4733 Obstructive sleep apnea (adult) (pediatric): Secondary | ICD-10-CM

## 2014-10-31 DIAGNOSIS — J45909 Unspecified asthma, uncomplicated: Secondary | ICD-10-CM

## 2014-10-31 DIAGNOSIS — I10 Essential (primary) hypertension: Secondary | ICD-10-CM

## 2014-10-31 DIAGNOSIS — R972 Elevated prostate specific antigen [PSA]: Secondary | ICD-10-CM

## 2014-10-31 NOTE — Patient Instructions (Signed)
Continue lifestyle intervention healthy eating and exercise . You are doing well.   Continue .   Check into shingles vaccine ( Zostavax) reimbursement or cost to you  and can return at any time if call ahead for injection. Plan repeat CBC and do hiv ,aby in 6 months .Marland Kitchen   Wellness  visit in 1 year .   Getting bmi below 30 is healthy goal weight about 165. Will send copy of psa to your urologist .

## 2014-10-31 NOTE — Progress Notes (Signed)
Pre visit review using our clinic review tool, if applicable. No additional management support is needed unless otherwise documented below in the visit note.  Chief Complaint  Patient presents with  . Annual Exam  . Hypertension  . Asthma    HPI: Patient  [name]  64 y.o. comes in today for Preventive Health Care visit  And Chronic disease management \ Since last visit has continued with his personal trainer is continuing to lose weight and exercise. Knees are doing pretty good had one episode of respiratory atypical asthma. No fevers unintentional weight loss is trying to lose weight and healthy manner. Has more energy with this. He is on Rapaflo sees the urologist once a year has elevated PSA monitoring no new symptoms. Hypertension been controlled Mood controlled less depressed on medication sees counselor Obstructive sleep apnea still on CPAP stable.  Health Maintenance  Topic Date Due  . HIV Screening  09/23/1966  . INFLUENZA VACCINE  10/30/2014  . ZOSTAVAX  10/31/2015 (Originally 09/23/2011)  . COLONOSCOPY  03/09/2016  . TETANUS/TDAP  09/18/2019   Health Maintenance Review LIFESTYLE:  Exercise:   continuing  With  PT  Tobacco/ETS: no   Alcohol: no Sugar beverages:  Try to not  Sleep: about 6-8 hours  Drug use: no  ROS:  GEN/ HEENT: No fever, significant weight changes sweats headaches vision problems hearing changes, CV/ PULM; No chest pain shortness of breath cough, syncope,edema  change in exercise tolerance. GI /GU: No adominal pain, vomiting, change in bowel habits. No blood in the stool. No significant GU symptoms. SKIN/HEME: ,no acute skin rashes suspicious lesions or bleeding. No lymphadenopathy, nodules, masses.  NEURO/ PSYCH:  No neurologic signs such as weakness numbness. No depression anxiety. IMM/ Allergy: No unusual infections.  Allergy .   REST of 12 system review negative except as per HPI   Past Medical History  Diagnosis Date  . Asthma   .  Rhinosinusitis   . Depression   . Hypertension     nl cardiolite in 1998  . Sleep apnea     wears CPAP  . Arthritis     knees  . Elevated PSA     [place] and bx negative 2011  . Pneumonia 2005    right hospitalized     Past Surgical History  Procedure Laterality Date  . Nasal sinus surgery    . Facial reconstruction surgery    . Ventral hernia repair  03-2007    rerepair winter 2010  . Shoulder atrhroscopic surgery      Right  . 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 Asthma    . Hypertension    . Diabetes Maternal Grandmother   . Heart failure Father     MV replacement  . Healthy Sister   . Healthy Brother   . Healthy Brother   . Colon cancer Neg Hx   . Pancreatic cancer Neg Hx   . Stomach cancer Neg Hx   . Esophageal cancer Neg Hx     History   Social History  . Marital Status: Single    Spouse Name: N/A  . Number of Children: N/A  . Years of Education: N/A   Occupational History  . 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 Smoking status: Never Smoker   . Smokeless tobacco: Never  Used  . Alcohol Use: No     Comment: Quit drinking ETOH 1986  . Drug Use: Not on file  . Sexual Activity: Not on file   Other Topics Concern  . 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 ADVAIR DISKUS 100-50 MCG/DOSE AEPB INHALE 1 PUFF INTO THE LUNGS 2 (TWO) TIMES DAILY. 60 each 4  . amLODipine-benazepril (LOTREL) 5-10 MG per capsule TAKE 1 CAPSULE BY MOUTH EVERY DAY 30 capsule 12  . ammonium lactate (AMLACTIN) 12 % cream Use as directed as needed     . aspirin 325 MG EC tablet Take 325 mg by mouth daily.      Marland Kitchen azelastine (ASTELIN) 137 MCG/SPRAY nasal spray USE AS DIRECTED 30 mL 5  .  COMBIVENT RESPIMAT 20-100 MCG/ACT AERS respimat INHALE 2 PUFFS UP TO FOUR TIMES A DAY AS NEEDED 4 g 3  . escitalopram (LEXAPRO) 20 MG tablet TAKE 1 & 1/2 TABLET DAILY 45 tablet 11  . fexofenadine (ALLEGRA) 180 MG tablet Take 180 mg by mouth daily.      Marland Kitchen ibuprofen (ADVIL,MOTRIN) 800 MG tablet TAKE 1 TABLET BY MOUTH EVERY 8 HOURS AS NEEDED 60 tablet 1  . NASONEX 50 MCG/ACT nasal spray USE 1 TO 2 SPRAYS IN EACH NOSTRIL DAILY 17 g 11  . protriptyline (VIVACTIL) 10 MG tablet Take 1 tablet (10 mg total) by mouth at bedtime. 30 tablet 5  . silodosin (RAPAFLO) 8 MG CAPS capsule Take 1 capsule (8 mg total) by mouth at bedtime. 30 capsule 11  . zafirlukast (ACCOLATE) 20 MG tablet TAKE 1 TABLET TWICE DAILY 60 tablet 0  . zafirlukast (ACCOLATE) 20 MG tablet Take 1 tablet (20 mg total) by mouth 2 (two) times daily. 60 tablet 0  . amoxicillin (AMOXIL) 500 MG capsule Take 1 capsule (500 mg total) by mouth 3 (three) times daily. 21 capsule 0  . doxycycline (VIBRAMYCIN) 100 MG capsule Take 1 capsule (100 mg total) by mouth 2 (two) times daily. 20 capsule 0  . predniSONE (DELTASONE) 10 MG tablet Taper as follows: 4-4-4-3-3-2-2 22 tablet 0  . predniSONE (DELTASONE) 20 MG tablet Take 1 tablet (20 mg total) by mouth daily with breakfast. 5 tablet 0   No facility-administered medications prior to visit.     EXAM:  BP 122/76 mmHg  Temp(Src) 97.9 F (36.6 C) (Oral)  Ht 5\' 3"  (1.6 m)  Wt 184 lb 4.8 oz (83.598 kg)  BMI 32.66 kg/m2  Body mass index is 32.66 kg/(m^2).  Physical Exam: Vital signs reviewed ZOX:WRUE is a well-developed well-nourished alert cooperative    who appearsr stated age in no acute distress.  HEENT: normocephalic atraumatic , Eyes: PERRL EOM's full, conjunctiva clear, Nares: paten,t no deformity discharge or tenderness., Ears: no deformity EAC's clear TMs with normal landmarks. Mouth: clear OP, no lesions, edema.  Moist mucous membranes. Dentition in adequate repair. NECK: supple without  masses, thyromegaly or bruits. CHEST/PULM:  Clear to auscultation and percussion breath sounds equal no wheeze , rales or rhonchi. No chest wall deformities or tenderness. CV: PMI is nondisplaced, S1 S2 no gallops, murmurs, rubs. Peripheral pulses are full without delay.No JVD .  ABDOMEN: Bowel sounds normal nontender  No guard or rebound, no hepato splenomegal no CVA tenderness.  Extremtities:  No clubbing cyanosis or edema, no acute joint swelling or redness no focal atrophy NEURO:  Oriented x3, cranial nerves 3-12 appear to be intact, no obvious focal weakness,gait within normal limits no abnormal reflexes or asymmetrical SKIN: No acute rashes normal turgor, color, no bruising or petechiae.  PSYCH: Oriented, good eye contact, no obvious depression anxiety, cognition and judgment appear normal. LN: no cervical axillary inguinal adenopathy  Lab Results  Component Value Date   WBC 7.3 10/24/2014   HGB 13.3 10/24/2014   HCT 39.3 10/24/2014   PLT 149.0* 10/24/2014   GLUCOSE 91 10/24/2014   CHOL 146 10/24/2014   TRIG 46.0 10/24/2014   HDL 46.50 10/24/2014   LDLCALC 90 10/24/2014   ALT 13 10/24/2014   AST 15 10/24/2014   NA 138 10/24/2014   K 4.5 10/24/2014   CL 101 10/24/2014   CREATININE 0.70 10/24/2014   BUN 14 10/24/2014   CO2 29 10/24/2014   TSH 2.62 10/24/2014   PSA 6.19* 10/24/2014   INR 0.96 06/20/2010   Wt Readings from Last 3 Encounters:  10/31/14 184 lb 4.8 oz (83.598 kg)  06/15/14 194 lb (87.998 kg)  04/26/14 196 lb (88.905 kg)     ASSESSMENT AND PLAN:  Discussed the following assessment and plan:  Visit for preventive health examination - utd  counseled - Plan: HIV antibody  Medication management - Consider medication review and minimization as he progresses on his lifestyle improvements  Essential hypertension - Controlled continue medicine at this time.  Elevated prostate specific antigen (PSA) - We'll send copy of PSA to his urologist.  Asthma, chronic,  unspecified asthma severity, uncomplicated - Appears to be stable. Has some eosinophilia on his CBC it seems persistent and not progressive  Obstructive sleep apnea  Thrombocytopenia - Mild borderline waxing and waning no bleeding no symptoms.follow - Plan: Eosinophil count, CBC with Differential/Platelet  Eosinophilia - waxes and wanes 10 % range  clincally normal except has allergy check absolute count next cbc - Plan: Eosinophil count, CBC with Differential/Platelet hiv   Screen at next blood test       Patient Care Team: Burnis Medin, MD as PCP - General Orion Crook, MD (Urology) orthopedic Festus Aloe, MD as Attending Physician (Urology) Deneise Lever, MD as Attending Physician (Pulmonary Disease) Patient Instructions  Continue lifestyle intervention healthy eating and exercise . You are doing well.   Continue .   Check into shingles vaccine ( Zostavax) reimbursement or cost to you  and can return at any time if call ahead for injection. Plan repeat CBC and do hiv ,aby in 6 months .Marland Kitchen   Wellness  visit in 1 year .   Getting bmi below 30 is healthy goal weight about 165. Will send copy of psa to your urologist .        Standley Brooking. Tyniya Kuyper M.D.

## 2014-11-07 ENCOUNTER — Other Ambulatory Visit: Payer: Self-pay | Admitting: Internal Medicine

## 2014-11-07 NOTE — Telephone Encounter (Signed)
Sent to the pharmacy by e-scribe. 

## 2014-11-20 ENCOUNTER — Other Ambulatory Visit: Payer: Self-pay | Admitting: Internal Medicine

## 2014-12-07 ENCOUNTER — Other Ambulatory Visit: Payer: Self-pay | Admitting: Internal Medicine

## 2014-12-07 ENCOUNTER — Telehealth: Payer: Self-pay | Admitting: Internal Medicine

## 2014-12-07 DIAGNOSIS — Z9989 Dependence on other enabling machines and devices: Principal | ICD-10-CM

## 2014-12-07 DIAGNOSIS — G4733 Obstructive sleep apnea (adult) (pediatric): Secondary | ICD-10-CM

## 2014-12-07 NOTE — Telephone Encounter (Signed)
Patient has appointment on 12/20/2014 Patient needs climate line tubing, Part Number: [id number]  Order sent Patient notified. Nothing further needed.

## 2014-12-16 ENCOUNTER — Other Ambulatory Visit: Payer: Self-pay | Admitting: Internal Medicine

## 2014-12-18 NOTE — Telephone Encounter (Signed)
Sent to the pharmacy by e-scribe. 

## 2014-12-20 ENCOUNTER — Encounter: Payer: Self-pay | Admitting: Internal Medicine

## 2014-12-20 ENCOUNTER — Ambulatory Visit (INDEPENDENT_AMBULATORY_CARE_PROVIDER_SITE_OTHER): Payer: BC Managed Care – PPO | Admitting: Internal Medicine

## 2014-12-20 VITALS — BP 122/80 | HR 74 | Ht 65.0 in | Wt 182.4 lb

## 2014-12-20 DIAGNOSIS — J45998 Other asthma: Secondary | ICD-10-CM | POA: Diagnosis not present

## 2014-12-20 DIAGNOSIS — G4733 Obstructive sleep apnea (adult) (pediatric): Secondary | ICD-10-CM | POA: Diagnosis not present

## 2014-12-20 DIAGNOSIS — Z23 Encounter for immunization: Secondary | ICD-10-CM | POA: Diagnosis not present

## 2014-12-20 NOTE — Patient Instructions (Signed)
Flu vax  Order- DME [facility]- replacement CPAP mask of choice and supplies: for ResMed S-9 machine needs:                       Climate Line tubing part # [id number]     Dx OSA

## 2014-12-20 NOTE — Progress Notes (Signed)
Subjective:    Patient ID: [name], male    DOB: [birth year], 64 y.o.   MRN: [id number]  HPI 56/1/12- 64 yoM followed for OSA, hx asthma Last here March 01, 2010. 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 No respiratory complications. CPAP doing very well. Likes his new machine. 15 cwp Advanced. Lunesta 3 mg every night. He was off Concerta through surgery and when he restarted it seemed too strong. He has stayed off. Continues Vivactil.   10/06/11- 64 yoM never smoker followed for OSA with hypersomnia, hx asthma Wears CPAP 15/Advanced every night for approximately 8 hours; pressure seems to be working well for patient ; Denies any wheezing, cough, congestion , or SOB at this time-under control. Using Lunesta 3 mg each night. It is worth it to him to self-pay. He also takes Vivactil at night. This was thought to help sleep apnea and depression. It does not seem to keep him awake. He is satisfied to remain off of Concerta but still notices some daytime tiredness. Breathing remains clear using Combivent Respimat one puff 2-3x daily. We gave instruction in technique. Medications reviewed.  12/01/12- 64 yoM never smoker followed for OSA with hypersomnia, hx asthma, complicated by Parkinson's FOLLOWS FOR:  Wearing CPAP 15/ Advanced every night 6-8 hrs.  Improved since last OV. Stopped Concerta. Disliked Lunesta. Exercising with personal trainer and weight down from 221 -209 pounds. Rare need for rescue inhaler Combivent.  12/16/13- 64 yoM never smoker followed for OSA with hypersomnia, hx asthma,  FOLLOWS FOR: Pt states he is wearing his CPAP 15/ Advanced nightly for 8 hours. Pt denies issues with mask, pressure or machine. pt states he is doing very well overall. Pt just finished amox on 9/17 for root canal.  12/20/14- 64 yoM never smoker followed for OSA with hypersomnia, hx asthma,  CPAP 15/ Advanced FOLLOWS FOR:  pt states he is doing good. pt states he had a problem with the hose of  his CPAP where it stretches and doesn't  stay connected to the mask.  pt needs a script for that part. pt using CPAP everynight for about 6 - 8 hours. mask and presssure good for pt. DME: [facility] Denies any problems with asthma, not needing rescue inhaler. No wheezing. It doesn't disturb his sleep.  ROS-see HPI Constitutional:   No-   weight loss, night sweats, fevers, chills, +fatigue, lassitude. HEENT:   No-  headaches, difficulty swallowing, tooth/dental problems, sore throat,       No-  sneezing, itching, ear ache, nasal congestion, post nasal drip,  CV:  No-   chest pain, orthopnea, PND, swelling in lower extremities, anasarca, dizziness, palpitations Resp: No-   shortness of breath with exertion or at rest.              No-   productive cough,  No non-productive cough,  No- coughing up of blood.              No-   change in color of mucus.  No- wheezing.   Skin: No-   rash or lesions. GI:  No-   heartburn, indigestion, abdominal pain, nausea, vomiting,  GU: n. MS:  No-   joint pain or swelling.   Neuro-  +early Parkinson's Psych:  No- change in mood or affect. No depression or anxiety.  No memory loss.  OBJ- Physical Exam General- Alert, Oriented, Affect-appropriate, Distress- none acute, overweight Skin- rash-none, lesions- none, excoriation- none Lymphadenopathy- none Head- atraumatic  Eyes- Gross vision intact, PERRLA, conjunctivae and secretions clear            Ears- Hearing, canals-normal            Nose- Clear, no-Septal dev, mucus, polyps, erosion, perforation             Throat- Mallampati II , mucosa clear , drainage- none, tonsils- atrophic Neck- flexible , trachea midline, no stridor , thyroid nl, carotid no bruit Chest - symmetrical excursion , unlabored           Heart/CV- RRR , no murmur , no gallop  , no rub, nl s1 s2                           - JVD- none , edema- none, stasis changes- none, varices- none           Lung- clear to P&A, wheeze- none, cough-  none , dullness-none, rub- none           Chest wall-  Abd-  Br/ Gen/ Rectal- Not done, not indicated Extrem- cyanosis- none, clubbing, none, atrophy- none, strength- nl Neuro- "masked facies"

## 2014-12-22 NOTE — Telephone Encounter (Signed)
Okay to refill? 

## 2014-12-22 NOTE — Telephone Encounter (Signed)
CY Please advise on refill. Thanks.  

## 2014-12-24 NOTE — Assessment & Plan Note (Signed)
Good compliance and control. He needs a replacement part and provides part number for his machine.

## 2014-12-24 NOTE — Assessment & Plan Note (Signed)
Well-controlled without needing rescue inhaler. Plan-flu vaccine

## 2015-01-06 ENCOUNTER — Other Ambulatory Visit: Payer: Self-pay | Admitting: Internal Medicine

## 2015-01-08 NOTE — Telephone Encounter (Signed)
Sent to the pharmacy by e-scribe. 

## 2015-01-29 ENCOUNTER — Other Ambulatory Visit: Payer: Self-pay | Admitting: Internal Medicine

## 2015-02-15 ENCOUNTER — Other Ambulatory Visit: Payer: Self-pay | Admitting: Internal Medicine

## 2015-05-03 ENCOUNTER — Other Ambulatory Visit: Payer: BC Managed Care – PPO

## 2015-05-07 ENCOUNTER — Other Ambulatory Visit (INDEPENDENT_AMBULATORY_CARE_PROVIDER_SITE_OTHER): Payer: BC Managed Care – PPO

## 2015-05-07 DIAGNOSIS — D696 Thrombocytopenia, unspecified: Secondary | ICD-10-CM

## 2015-05-07 DIAGNOSIS — D721 Eosinophilia, unspecified: Secondary | ICD-10-CM

## 2015-05-07 DIAGNOSIS — Z0000 Encounter for general adult medical examination without abnormal findings: Secondary | ICD-10-CM

## 2015-05-07 LAB — CBC WITH DIFFERENTIAL/PLATELET
BASOS PCT: 0.3 % (ref 0.0–3.0)
Basophils Absolute: 0 10*3/uL (ref 0.0–0.1)
EOS ABS: 0.7 10*3/uL (ref 0.0–0.7)
EOS PCT: 7.8 % — AB (ref 0.0–5.0)
HCT: 44 % (ref 39.0–52.0)
HEMOGLOBIN: 14.8 g/dL (ref 13.0–17.0)
LYMPHS ABS: 1.3 10*3/uL (ref 0.7–4.0)
Lymphocytes Relative: 15.4 % (ref 12.0–46.0)
MCHC: 33.6 g/dL (ref 30.0–36.0)
MCV: 103.3 fl — ABNORMAL HIGH (ref 78.0–100.0)
MONO ABS: 0.8 10*3/uL (ref 0.1–1.0)
Monocytes Relative: 9.5 % (ref 3.0–12.0)
NEUTROS ABS: 5.8 10*3/uL (ref 1.4–7.7)
Neutrophils Relative %: 67 % (ref 43.0–77.0)
Platelets: 188 10*3/uL (ref 150.0–400.0)
RBC: 4.26 Mil/uL (ref 4.22–5.81)
RDW: 14.9 % (ref 11.5–15.5)
WBC: 8.7 10*3/uL (ref 4.0–10.5)

## 2015-05-07 LAB — EOSINOPHIL COUNT: Eosinophils Absolute: 0.7 10*3/uL (ref 0.0–0.7)

## 2015-05-08 LAB — HIV ANTIBODY (ROUTINE TESTING W REFLEX): HIV: NONREACTIVE

## 2015-06-04 ENCOUNTER — Other Ambulatory Visit: Payer: Self-pay | Admitting: Family Medicine

## 2015-06-04 MED ORDER — AMLODIPINE BESY-BENAZEPRIL HCL 5-10 MG PO CAPS: ORAL_CAPSULE | ORAL | Status: DC

## 2015-06-04 MED ORDER — ESCITALOPRAM OXALATE 20 MG PO TABS: ORAL_TABLET | ORAL | Status: DC

## 2015-06-04 NOTE — Telephone Encounter (Signed)
Received fax from pharmacy.  Pt now requesting a 90 day supply.  Sent to the pharmacy by e-scribe.  Pt has upcoming cpx scheduled for 11/02/15

## 2015-06-20 ENCOUNTER — Other Ambulatory Visit: Payer: Self-pay | Admitting: Internal Medicine

## 2015-07-24 ENCOUNTER — Other Ambulatory Visit: Payer: Self-pay | Admitting: Internal Medicine

## 2015-07-25 ENCOUNTER — Other Ambulatory Visit: Payer: Self-pay | Admitting: Internal Medicine

## 2015-07-25 NOTE — Telephone Encounter (Signed)
Sent to the pharmacy by e-scribe. Pt has upcoming cpx on 11/02/15

## 2015-07-27 ENCOUNTER — Ambulatory Visit (INDEPENDENT_AMBULATORY_CARE_PROVIDER_SITE_OTHER): Payer: BC Managed Care – PPO | Admitting: Internal Medicine

## 2015-07-27 ENCOUNTER — Encounter: Payer: Self-pay | Admitting: Internal Medicine

## 2015-07-27 VITALS — BP 110/70 | HR 86 | Temp 97.8°F | Wt 194.4 lb

## 2015-07-27 DIAGNOSIS — J309 Allergic rhinitis, unspecified: Secondary | ICD-10-CM

## 2015-07-27 DIAGNOSIS — J329 Chronic sinusitis, unspecified: Secondary | ICD-10-CM

## 2015-07-27 DIAGNOSIS — J45909 Unspecified asthma, uncomplicated: Secondary | ICD-10-CM | POA: Diagnosis not present

## 2015-07-27 MED ORDER — PREDNISONE 20 MG PO TABS: ORAL_TABLET | ORAL | Status: DC

## 2015-07-27 MED ORDER — AMOXICILLIN-POT CLAVULANATE 875-125 MG PO TABS: 1.0000 | ORAL_TABLET | Freq: Two times a day (BID) | ORAL | Status: DC

## 2015-07-27 NOTE — Progress Notes (Signed)
Chief Complaint  Patient presents with  . Allergic Rhinitis     sins sx has hx of same    HPI: [name] 65 y.o.  [name]  comes in today for SDA for  new problem evaluation. Hx of ht asthma allergy   osa on maintenance med followed by [name] comesin today   About [place] ago outside [place]  With allergy and since then feeling bad   ? i ffdever achy   ... Itchy in chest and cough and mild wheezing  .   Head congestion . Face pain and pressure noted  ROS: See pertinent positives and negatives per HPI. No chills  Thick nasal drainage   Past Medical History  Diagnosis Date  . Asthma   . Rhinosinusitis   . Depression   . Hypertension     nl cardiolite in 1998  . Sleep apnea     wears CPAP  . Arthritis     knees  . Elevated PSA     [place] and bx negative 2011  . Pneumonia 2005    right hospitalized     Family History  Problem Relation Age of Onset  . Asthma    . Hypertension    . Diabetes Maternal Grandmother   . Heart failure Father     MV replacement  . Healthy Sister   . Healthy Brother   . Healthy Brother   . Colon cancer Neg Hx   . Pancreatic cancer Neg Hx   . Stomach cancer Neg Hx   . Esophageal cancer Neg Hx     Social History   Social History  . Marital Status: Single    Spouse Name: N/A  . Number of Children: N/A  . Years of Education: N/A   Occupational History  . 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 Smoking status: Never Smoker   . Smokeless tobacco: Never Used  . Alcohol Use: No     Comment: Quit drinking ETOH 1986  . Drug Use: None  . Sexual Activity: Not Asked   Other Topics Concern  . 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 ADVAIR DISKUS 100-50 MCG/DOSE AEPB INHALE 1 PUFF INTO THE LUNGS 2 (TWO)  TIMES DAILY. 60 each 3  . amLODipine-benazepril (LOTREL) 5-10 MG capsule TAKE 1 CAPSULE BY MOUTH EVERY DAY 90 capsule 1  . ammonium lactate (AMLACTIN) 12 % cream Use as directed as needed     . aspirin 325 MG EC tablet Take 325 mg by mouth daily.      Marland Kitchen azelastine (ASTELIN) 137 MCG/SPRAY nasal spray USE AS DIRECTED 30 mL 5  . COMBIVENT RESPIMAT 20-100 MCG/ACT AERS respimat INHALE 2 PUFFS UP TO FOUR TIMES A DAY AS NEEDED 4 g 3  . escitalopram (LEXAPRO) 20 MG tablet TAKE 1 & 1/2 TABLET DAILY 135 tablet 1  . fexofenadine (ALLEGRA) 180 MG tablet Take 180 mg by mouth daily.      Marland Kitchen ibuprofen (ADVIL,MOTRIN) 800 MG tablet TAKE 1 TABLET BY MOUTH EVERY 8 HOURS AS NEEDED 60 tablet 1  . mometasone (NASONEX) 50 MCG/ACT nasal spray USE 1 TO 2 SPRAYS IN EACH NOSTRIL DAILY 17  g 3  . protriptyline (VIVACTIL) 10 MG tablet TAKE 1 TABLET (10 MG TOTAL) BY MOUTH AT BEDTIME. 30 tablet 5  . silodosin (RAPAFLO) 8 MG CAPS capsule Take 1 capsule (8 mg total) by mouth at bedtime. 30 capsule 11  . zafirlukast (ACCOLATE) 20 MG tablet TAKE 1 TABLET TWICE DAILY 60 tablet 0   No facility-administered medications prior to visit.     EXAM:  BP 110/70 mmHg  Pulse 86  Temp(Src) 97.8 F (36.6 C) (Oral)  Wt 194 lb 6.4 oz (88.179 kg)  SpO2 96%  Body mass index is 32.35 kg/(m^2).  GENERAL: vitals reviewed and listed above, alert, oriented, appears well hydrated and in no acute distressver congested  Upper  HEENT: atraumatic, conjunctiva  clear,  Nares congested mild facial tenderness   Op NAD aireway good NECK: no obvious masses on inspection palpation  LUNGS: clear to auscultation bilaterally, no wheezes, rales or rhonchi, rare wheeze bs=  CV: HRRR, no clubbing cyanosis or  peripheral edema nl cap refill  MS: moves all extremities without noticeable focal  abnormality PSYCH: pleasant and cooperative, no obvious depression or anxiety Wt Readings from Last 3 Encounters:  07/27/15 194 lb 6.4 oz (88.179 kg)  12/20/14 182  lb 6.4 oz (82.736 kg)  10/31/14 184 lb 4.8 oz (83.598 kg)    ASSESSMENT AND PLAN:  Discussed the following assessment and plan:  Sinusitis, unspecified chronicity, unspecified location  Asthma, chronic, unspecified asthma severity, uncomplicated  Allergic rhinitis, unspecified allergic rhinitis type At risk for flare on multifaceted   Controller meds  -Patient advised to return or notify health care team  if symptoms worsen ,persist or new concerns arise.  Patient Instructions  continue  Allergy regimen Treat for sinusitis  Allergy  Antibiotic  And prednisone. Fu if  persistent or progressive   Standley Brooking. Panosh M.D.

## 2015-07-27 NOTE — Patient Instructions (Signed)
continue  Allergy regimen Treat for sinusitis  Allergy  Antibiotic  And prednisone. Fu if  persistent or progressive

## 2015-07-27 NOTE — Progress Notes (Signed)
Pre visit review using our clinic review tool, if applicable. No additional management support is needed unless otherwise documented below in the visit note. 

## 2015-08-20 ENCOUNTER — Other Ambulatory Visit: Payer: Self-pay | Admitting: Internal Medicine

## 2015-08-20 NOTE — Telephone Encounter (Signed)
Ok to refill 

## 2015-08-20 NOTE — Telephone Encounter (Signed)
CY Please advise on refill. Thanks.  

## 2015-08-24 ENCOUNTER — Other Ambulatory Visit: Payer: Self-pay | Admitting: Internal Medicine

## 2015-09-23 ENCOUNTER — Other Ambulatory Visit: Payer: Self-pay | Admitting: Internal Medicine

## 2015-09-25 NOTE — Telephone Encounter (Signed)
Sent to the pharmacy by e-scribe. 

## 2015-10-26 ENCOUNTER — Other Ambulatory Visit (INDEPENDENT_AMBULATORY_CARE_PROVIDER_SITE_OTHER): Payer: BC Managed Care – PPO

## 2015-10-26 DIAGNOSIS — Z0000 Encounter for general adult medical examination without abnormal findings: Secondary | ICD-10-CM

## 2015-10-26 LAB — CBC WITH DIFFERENTIAL/PLATELET
BASOS PCT: 0.3 % (ref 0.0–3.0)
Basophils Absolute: 0 10*3/uL (ref 0.0–0.1)
EOS PCT: 13.3 % — AB (ref 0.0–5.0)
Eosinophils Absolute: 1 10*3/uL — ABNORMAL HIGH (ref 0.0–0.7)
HCT: 40.3 % (ref 39.0–52.0)
Hemoglobin: 14 g/dL (ref 13.0–17.0)
LYMPHS ABS: 1.3 10*3/uL (ref 0.7–4.0)
Lymphocytes Relative: 16.3 % (ref 12.0–46.0)
MCHC: 34.8 g/dL (ref 30.0–36.0)
MCV: 99.3 fl (ref 78.0–100.0)
MONO ABS: 1 10*3/uL (ref 0.1–1.0)
Monocytes Relative: 13.1 % — ABNORMAL HIGH (ref 3.0–12.0)
NEUTROS PCT: 57 % (ref 43.0–77.0)
Neutro Abs: 4.4 10*3/uL (ref 1.4–7.7)
PLATELETS: 183 10*3/uL (ref 150.0–400.0)
RBC: 4.05 Mil/uL — ABNORMAL LOW (ref 4.22–5.81)
RDW: 14.8 % (ref 11.5–15.5)
WBC: 7.7 10*3/uL (ref 4.0–10.5)

## 2015-10-26 LAB — HEPATIC FUNCTION PANEL
ALT: 20 U/L (ref 0–53)
AST: 26 U/L (ref 0–37)
Albumin: 4.5 g/dL (ref 3.5–5.2)
Alkaline Phosphatase: 62 U/L (ref 39–117)
BILIRUBIN TOTAL: 1.2 mg/dL (ref 0.2–1.2)
Bilirubin, Direct: 0.2 mg/dL (ref 0.0–0.3)
Total Protein: 6.4 g/dL (ref 6.0–8.3)

## 2015-10-26 LAB — POC URINALSYSI DIPSTICK (AUTOMATED)
BILIRUBIN UA: NEGATIVE
Blood, UA: NEGATIVE
Glucose, UA: NEGATIVE
KETONES UA: NEGATIVE
LEUKOCYTES UA: NEGATIVE
Nitrite, UA: NEGATIVE
PROTEIN UA: NEGATIVE
Spec Grav, UA: 1.015
Urobilinogen, UA: 0.2
pH, UA: 6

## 2015-10-26 LAB — LIPID PANEL
CHOLESTEROL: 161 mg/dL (ref 0–200)
HDL: 46.2 mg/dL (ref >39.00)
LDL CALC: 101 mg/dL — AB (ref 0–99)
NonHDL: 114.61
TRIGLYCERIDES: 68 mg/dL (ref 0.0–149.0)
Total CHOL/HDL Ratio: 3
VLDL: 13.6 mg/dL (ref 0.0–40.0)

## 2015-10-26 LAB — BASIC METABOLIC PANEL
BUN: 24 mg/dL — AB (ref 6–23)
CHLORIDE: 97 meq/L (ref 96–112)
CO2: 29 mEq/L (ref 19–32)
Calcium: 9.2 mg/dL (ref 8.4–10.5)
Creatinine, Ser: 0.81 mg/dL (ref 0.40–1.50)
GFR: 101.94 mL/min (ref >60.00)
GLUCOSE: 78 mg/dL (ref 70–99)
POTASSIUM: 4.5 meq/L (ref 3.5–5.1)
Sodium: 132 mEq/L — ABNORMAL LOW (ref 135–145)

## 2015-10-26 LAB — PSA: PSA: 7.83 ng/mL — ABNORMAL HIGH (ref 0.10–4.00)

## 2015-10-26 LAB — TSH: TSH: 2.24 u[IU]/mL (ref 0.35–4.50)

## 2015-11-01 NOTE — Progress Notes (Signed)
Pre visit review using our clinic review tool, if applicable. No additional management support is needed unless otherwise documented below in the visit note.  Chief Complaint  Patient presents with  . Annual Exam    HPI:  Patient  [name]  65 y.o. comes in today for [place] visit  Continues to exercise  Stuck at this weight for a while  Feels fine  Although recet sinus infection and  injectinos in shoulders per [name] for  arthrisis [name]  Elevated psa   Followed  Urology last one in the 4 range  Hx neg bx x 2   bp good    Asthma ok   Mood ok in counseling an on med   Health Maintenance  Topic Date Due  . Hepatitis C Screening  [birth year]  . INFLUENZA VACCINE  10/30/2015  . COLONOSCOPY  03/09/2016  . TETANUS/TDAP  09/18/2019  . ZOSTAVAX  Completed  . HIV Screening  Completed   Health Maintenance Review LIFESTYLE:  Exercise:  gym work ou  Tobacco/ETS:n Alcohol: per day nsig Sugar beverages:n Sleep: cpap ok but up  For social reasons  Drug use: no Mood counseling and doing well with depression     ROS:  GEN/ HEENT: No fever, significant weight changes sweats headaches vision problems hearing changes, CV/ PULM; No chest pain shortness of breath cough, syncope,edema  change in exercise tolerance. GI /GU: No adominal pain, vomiting, change in bowel habits. No blood in the stool. No significant GU symptoms. SKIN/HEME: ,no acute skin rashes suspicious lesions or bleeding. No lymphadenopathy, nodules, masses.  NEURO/ PSYCH:  No neurologic signs such as weakness numbness. No depression anxiety. IMM/ Allergy: No unusual infections.  Allergy .   REST of 12 system review negative except as per HPI   Past Medical History:  Diagnosis Date  . Arthritis    knees  . Asthma   . Depression   . Elevated PSA    [place] and bx negative 2011  . Hypertension    nl cardiolite in 1998  . Pneumonia 2005   right hospitalized   . Rhinosinusitis   . Sleep  apnea    wears CPAP    Past Surgical History:  Procedure Laterality Date  . FACIAL RECONSTRUCTION SURGERY    . NASAL SINUS SURGERY    . 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 shoulder atrhroscopic surgery     Right  . VENTRAL HERNIA REPAIR  03-2007   rerepair winter 2010    Family History  Problem Relation Age of Onset  . Asthma    . Hypertension    . Diabetes Maternal Grandmother   . Heart failure Father     MV replacement  . Healthy Sister   . Healthy Brother   . Healthy Brother   . Colon cancer Neg Hx   . Pancreatic cancer Neg Hx   . Stomach cancer Neg Hx   . Esophageal cancer Neg Hx     Social History   Social History  . Marital status: Single    Spouse name: N/A  . Number of children: N/A  . Years of education: N/A   Occupational History  . 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 Smoking status: Never Smoker  . Smokeless tobacco: Never Used  . Alcohol use No     Comment: Quit drinking ETOH 1986  .  Drug use: Unknown  . Sexual activity: Not Asked   Other Topics Concern  . 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 ADVAIR DISKUS 100-50 MCG/DOSE AEPB INHALE 1 PUFF INTO THE LUNGS 2 (TWO) TIMES DAILY. 60 each 3  . amLODipine-benazepril (LOTREL) 5-10 MG capsule TAKE 1 CAPSULE BY MOUTH EVERY DAY 90 capsule 1  . ammonium lactate (AMLACTIN) 12 % cream Use as directed as needed     . aspirin 325 MG EC tablet Take 325 mg by mouth daily.      Marland Kitchen azelastine (ASTELIN) 137 MCG/SPRAY nasal spray USE AS DIRECTED 30 mL 5  . COMBIVENT RESPIMAT 20-100 MCG/ACT AERS respimat INHALE 2 PUFFS UP TO FOUR TIMES A DAY AS NEEDED 1 Inhaler 3  . escitalopram (LEXAPRO) 20 MG tablet TAKE 1 & 1/2 TABLET DAILY 135 tablet 1  . fexofenadine  (ALLEGRA) 180 MG tablet Take 180 mg by mouth daily.      Marland Kitchen ibuprofen (ADVIL,MOTRIN) 800 MG tablet TAKE 1 TABLET BY MOUTH EVERY 8 HOURS AS NEEDED 60 tablet 1  . mometasone (NASONEX) 50 MCG/ACT nasal spray USE 1 TO 2 SPRAYS IN EACH NOSTRIL DAILY 17 g 3  . protriptyline (VIVACTIL) 10 MG tablet TAKE 1 TABLET (10 MG TOTAL) BY MOUTH AT BEDTIME. 30 tablet 5  . silodosin (RAPAFLO) 8 MG CAPS capsule Take 1 capsule (8 mg total) by mouth at bedtime. 30 capsule 11  . zafirlukast (ACCOLATE) 20 MG tablet TAKE 1 TABLET TWICE DAILY 60 tablet 0  . zafirlukast (ACCOLATE) 20 MG tablet TAKE 1 TABLET TWICE DAILY 180 tablet 0  . amoxicillin-clavulanate (AUGMENTIN) 875-125 MG tablet Take 1 tablet by mouth 2 (two) times daily. For sinusiits 15 tablet 0  . predniSONE (DELTASONE) 20 MG tablet Take 3 po qd for 2 days then 2 po qd for 3 days,or as directed 12 tablet 0   No facility-administered medications prior to visit.      EXAM:  BP 128/90 (BP Location: Right Arm, Patient Position: Sitting, Cuff Size: Normal)   Temp 97.5 F (36.4 C) (Oral)   Ht 5' 4.5" (1.638 m)   Wt 193 lb 3.2 oz (87.6 kg)   BMI 32.65 kg/m   Body mass index is 32.65 kg/m.  Physical Exam: Vital signs reviewed [id number] is a well-developed well-nourished alert cooperative    who appearsr stated age in no acute distress.  HEENT: normocephalic atraumatic , Eyes: PERRL EOM's full, conjunctiva clear, Nares: paten,t no  discharge or tenderness., Ears: no deformity EAC's clear TMs with normal landmarks. Mouth: clear OP, no lesions, edema.  Moist mucous membranes. Dentition in adequate repair. NECK: supple without masses, thyromegaly or bruits. CHEST/PULM:  Clear to auscultation and percussion breath sounds equal no wheeze , rales or rhonchi. No chest wall deformities or tenderness. CV: PMI is nondisplaced, S1 S2 no gallops, murmurs, rubs. Peripheral pulses are full without delay.No JVD .  ABDOMEN: Bowel sounds normal nontender  No guard or  rebound, no hepato splenomegal no CVA tenderness.  No hernia.  diastasis  Large no mass   Extremtities:  No clubbing cyanosis or edema, no acute joint swelling or redness no focal atrophy NEURO:  Oriented x3, cranial nerves 3-12 appear to be intact, no obvious focal weakness,gait within normal  limits no abnormal reflexes or asymmetrical SKIN: No acute rashes normal turgor, color, no bruising or petechiae. PSYCH: Oriented, good eye contact, no obvious depression anxiety, cognition and judgment appear normal. LN: no cervical axillary inguinal adenopathy  Lab Results  Component Value Date   WBC 7.7 10/26/2015   HGB 14.0 10/26/2015   HCT 40.3 10/26/2015   PLT 183.0 10/26/2015   GLUCOSE 78 10/26/2015   CHOL 161 10/26/2015   TRIG 68.0 10/26/2015   HDL 46.20 10/26/2015   LDLCALC 101 (H) 10/26/2015   ALT 20 10/26/2015   AST 26 10/26/2015   NA 132 (L) 10/26/2015   K 4.5 10/26/2015   CL 97 10/26/2015   CREATININE 0.81 10/26/2015   BUN 24 (H) 10/26/2015   CO2 29 10/26/2015   TSH 2.24 10/26/2015   PSA 7.83 (H) 10/26/2015   INR 0.96 06/20/2010   Wt Readings from Last 3 Encounters:  11/02/15 193 lb 3.2 oz (87.6 kg)  07/27/15 194 lb 6.4 oz (88.2 kg)  12/20/14 182 lb 6.4 oz (82.7 kg)   BP Readings from Last 3 Encounters:  11/02/15 128/90  07/27/15 110/70  12/20/14 122/80    ASSESSMENT AND PLAN:  Discussed the following assessment and plan:  Visit for preventive health examination  Medication management  Asthma, chronic, unspecified asthma severity, uncomplicated  Essential hypertension  BMI 33.0-33.9,adult  Elevated prostate specific antigen (PSA) - neg bx x 2 so far followed urology   Need for shingles vaccine - Plan: Varicella-zoster vaccine subcutaneous  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 Recheck blood count labs for eosinophil cells in 1 month or so  Can do hep c screen at that time .   If all ok then yearly check up. Consider seeing dietician  nutritionist again if gettng stucl on  Getting the weight down       Why follow it? Research shows. . Those who follow the Mediterranean diet have a reduced risk of heart disease  . The diet is associated with a reduced incidence of Parkinson's and Alzheimer's diseases . People following the diet may have longer life expectancies and lower rates of chronic diseases  . The Dietary Guidelines for Americans recommends the Mediterranean diet as an eating plan to promote health and prevent disease  What Is the Mediterranean Diet?  . Healthy eating plan based on typical foods and recipes of Mediterranean-style cooking . The diet is primarily a plant based diet; these foods should make up a majority of meals   Starches - Plant based foods should make up a majority of meals - They are an important sources of vitamins, minerals, energy, antioxidants, and fiber - Choose whole grains, foods high in fiber and minimally processed items  - Typical grain sources include wheat, oats, barley, corn, brown rice, bulgar, farro, millet, polenta, couscous  - Various types of beans include chickpeas, lentils, fava beans, black beans, white beans   Fruits  Veggies - Large quantities of antioxidant rich fruits & veggies; 6 or more  servings  - Vegetables can be eaten raw or lightly drizzled with oil and cooked  - Vegetables common to the traditional Mediterranean Diet include: artichokes, arugula, beets,  broccoli, brussel sprouts, cabbage, carrots, celery, collard greens, cucumbers, eggplant, kale, leeks, lemons, lettuce, mushrooms, okra, onions, peas, peppers, potatoes, pumpkin, radishes, rutabaga, shallots, spinach, sweet potatoes, turnips, zucchini - Fruits common to the Mediterranean Diet include: apples, apricots, avocados, cherries, clementines, dates, figs, grapefruits, grapes, melons, nectarines, oranges, peaches, pears, pomegranates, strawberries, tangerines  Fats - Replace butter and margarine with healthy oils, such as olive oil, canola oil, and tahini  - Limit nuts to no more than a handful a day  - Nuts include walnuts, almonds, pecans, pistachios, pine nuts  - Limit or avoid candied, honey roasted or heavily salted nuts - Olives are central to the [employer] - can be eaten whole or used in a variety of dishes   Meats Protein - Limiting red meat: no more than a few times a month - When eating red meat: choose lean cuts and keep the portion to the size of deck of cards - Eggs: approx. 0 to 4 times a week  - Fish and lean poultry: at least 2 a week  - Healthy protein sources include, chicken, [place], lean beef, lamb - Increase intake of seafood such as tuna, salmon, trout, mackerel, shrimp, scallops - Avoid or limit high fat processed meats such as sausage and bacon  Dairy - Include moderate amounts of low fat dairy products  - Focus on healthy dairy such as fat free yogurt, skim milk, low or reduced fat cheese - Limit dairy products higher in fat such as whole or 2% milk, cheese, ice cream  Alcohol - Moderate amounts of red wine is ok  - No more than 5 oz daily for women (all ages) and men older than age 42  - No more than 10 oz of wine daily for men younger than 34  Other - Limit sweets and other desserts  - Use herbs and spices instead of salt to flavor foods  - Herbs and spices common to the traditional Mediterranean Diet include: basil, bay leaves, chives, cloves, cumin,  fennel, garlic, lavender, marjoram, mint, oregano, parsley, pepper, rosemary, sage, savory, sumac, tarragon, thyme   It's not just a diet, it's a lifestyle:  . The Mediterranean diet includes lifestyle factors typical of those in the region  . Foods, drinks and meals are best eaten with others and savored . Daily physical activity is important for overall good health . This could be strenuous exercise like running and aerobics . This could also be more leisurely activities such as walking, housework, yard-work, or taking the stairs . Moderation is the key; a balanced and healthy diet accommodates most foods and drinks . Consider portion sizes and frequency of consumption of certain foods   Meal Ideas & Options:  . Breakfast:  o Whole wheat toast or whole wheat English muffins with peanut butter & hard boiled egg o Steel cut oats topped with apples & cinnamon and skim milk  o Fresh fruit: banana, strawberries, melon, berries, peaches  o Smoothies: strawberries, bananas, greek yogurt, peanut butter o Low fat greek yogurt with blueberries and granola  o Egg white omelet with spinach and mushrooms o Breakfast couscous: whole wheat couscous, apricots, skim milk, cranberries  . Sandwiches:  o Hummus and grilled vegetables (peppers, zucchini, squash) on whole wheat bread   o Grilled chicken on whole wheat pita with lettuce, tomatoes,  cucumbers or tzatziki  o [employer] on whole wheat bread: tuna salad made with greek yogurt, olives, red peppers, capers, green onions o Garlic rosemary lamb pita: lamb sauted with garlic, rosemary, salt & pepper; add lettuce, cucumber, greek yogurt to pita - flavor with lemon juice and black pepper  . Seafood:  o Mediterranean grilled salmon, seasoned with garlic, basil, parsley, lemon juice and black pepper o Shrimp, lemon, and spinach whole-grain pasta salad made with low fat greek yogurt  o Seared scallops with lemon orzo  o Seared tuna steaks seasoned salt,  pepper, coriander topped with tomato mixture of olives, tomatoes, olive oil, minced garlic, parsley, green onions and cappers  . Meats:  o Herbed greek chicken salad with kalamata olives, cucumber, feta  o Red bell peppers stuffed with spinach, bulgur, lean ground beef (or lentils) & topped with feta   o Kebabs: skewers of chicken, tomatoes, onions, zucchini, squash  o [place] burgers: made with red onions, mint, dill, lemon juice, feta cheese topped with roasted red peppers . Vegetarian o Cucumber salad: cucumbers, artichoke hearts, celery, red onion, feta cheese, tossed in olive oil & lemon juice  o Hummus and whole grain pita points with a greek salad (lettuce, tomato, feta, olives, cucumbers, red onion) o Lentil soup with celery, carrots made with vegetable broth, garlic, salt and pepper  o Tabouli salad: parsley, bulgur, mint, scallions, cucumbers, tomato, radishes, lemon juice, olive oil, salt and pepper.        Standley Brooking. Shontez Sermon M.D.

## 2015-11-02 ENCOUNTER — Ambulatory Visit (INDEPENDENT_AMBULATORY_CARE_PROVIDER_SITE_OTHER): Payer: BC Managed Care – PPO | Admitting: Internal Medicine

## 2015-11-02 ENCOUNTER — Encounter: Payer: Self-pay | Admitting: Internal Medicine

## 2015-11-02 VITALS — BP 128/90 | Temp 97.5°F | Ht 64.5 in | Wt 193.2 lb

## 2015-11-02 DIAGNOSIS — Z23 Encounter for immunization: Secondary | ICD-10-CM | POA: Diagnosis not present

## 2015-11-02 DIAGNOSIS — Z0000 Encounter for general adult medical examination without abnormal findings: Secondary | ICD-10-CM

## 2015-11-02 DIAGNOSIS — Z6833 Body mass index (BMI) 33.0-33.9, adult: Secondary | ICD-10-CM | POA: Diagnosis not present

## 2015-11-02 DIAGNOSIS — I10 Essential (primary) hypertension: Secondary | ICD-10-CM | POA: Diagnosis not present

## 2015-11-02 DIAGNOSIS — Z79899 Other long term (current) drug therapy: Secondary | ICD-10-CM | POA: Diagnosis not present

## 2015-11-02 DIAGNOSIS — Z1159 Encounter for screening for other viral diseases: Secondary | ICD-10-CM

## 2015-11-02 DIAGNOSIS — D721 Eosinophilia, unspecified: Secondary | ICD-10-CM

## 2015-11-02 DIAGNOSIS — J45909 Unspecified asthma, uncomplicated: Secondary | ICD-10-CM

## 2015-11-02 DIAGNOSIS — R972 Elevated prostate specific antigen [PSA]: Secondary | ICD-10-CM

## 2015-11-02 NOTE — Patient Instructions (Signed)
Continue lifestyle intervention healthy eating and exercise . Recheck blood count labs for eosinophil cells in 1 month or so  Can do hep c screen at that time .   If all ok then yearly check up. Consider seeing dietician  nutritionist again if gettng stucl on  Getting the weight down       Why follow it? Research shows. . Those who follow the Mediterranean diet have a reduced risk of heart disease  . The diet is associated with a reduced incidence of Parkinson's and Alzheimer's diseases . People following the diet may have longer life expectancies and lower rates of chronic diseases  . The Dietary Guidelines for Americans recommends the Mediterranean diet as an eating plan to promote health and prevent disease  What Is the Mediterranean Diet?  . Healthy eating plan based on typical foods and recipes of Mediterranean-style cooking . The diet is primarily a plant based diet; these foods should make up a majority of meals   Starches - Plant based foods should make up a majority of meals - They are an important sources of vitamins, minerals, energy, antioxidants, and fiber - Choose whole grains, foods high in fiber and minimally processed items  - Typical grain sources include wheat, oats, barley, corn, brown rice, bulgar, farro, millet, polenta, couscous  - Various types of beans include chickpeas, lentils, fava beans, black beans, white beans   Fruits  Veggies - Large quantities of antioxidant rich fruits & veggies; 6 or more servings  - Vegetables can be eaten raw or lightly drizzled with oil and cooked  - Vegetables common to the traditional Mediterranean Diet include: artichokes, arugula, beets, broccoli, brussel sprouts, cabbage, carrots, celery, collard greens, cucumbers, eggplant, kale, leeks, lemons, lettuce, mushrooms, okra, onions, peas, peppers, potatoes, pumpkin, radishes, rutabaga, shallots, spinach, sweet potatoes, turnips, zucchini - Fruits common to the Mediterranean Diet include:  apples, apricots, avocados, cherries, clementines, dates, figs, grapefruits, grapes, melons, nectarines, oranges, peaches, pears, pomegranates, strawberries, tangerines  Fats - Replace butter and margarine with healthy oils, such as olive oil, canola oil, and tahini  - Limit nuts to no more than a handful a day  - Nuts include walnuts, almonds, pecans, pistachios, pine nuts  - Limit or avoid candied, honey roasted or heavily salted nuts - Olives are central to the [employer] - can be eaten whole or used in a variety of dishes   Meats Protein - Limiting red meat: no more than a few times a month - When eating red meat: choose lean cuts and keep the portion to the size of deck of cards - Eggs: approx. 0 to 4 times a week  - Fish and lean poultry: at least 2 a week  - Healthy protein sources include, chicken, [place], lean beef, lamb - Increase intake of seafood such as tuna, salmon, trout, mackerel, shrimp, scallops - Avoid or limit high fat processed meats such as sausage and bacon  Dairy - Include moderate amounts of low fat dairy products  - Focus on healthy dairy such as fat free yogurt, skim milk, low or reduced fat cheese - Limit dairy products higher in fat such as whole or 2% milk, cheese, ice cream  Alcohol - Moderate amounts of red wine is ok  - No more than 5 oz daily for women (all ages) and men older than age 67  - No more than 10 oz of wine daily for men younger than 80  Other - Limit sweets and other desserts  -  Use herbs and spices instead of salt to flavor foods  - Herbs and spices common to the traditional Mediterranean Diet include: basil, bay leaves, chives, cloves, cumin, fennel, garlic, lavender, marjoram, mint, oregano, parsley, pepper, rosemary, sage, savory, sumac, tarragon, thyme   It's not just a diet, it's a lifestyle:  . The Mediterranean diet includes lifestyle factors typical of those in the region  . Foods, drinks and meals are best eaten with others and  savored . Daily physical activity is important for overall good health . This could be strenuous exercise like running and aerobics . This could also be more leisurely activities such as walking, housework, yard-work, or taking the stairs . Moderation is the key; a balanced and healthy diet accommodates most foods and drinks . Consider portion sizes and frequency of consumption of certain foods   Meal Ideas & Options:  . Breakfast:  o Whole wheat toast or whole wheat English muffins with peanut butter & hard boiled egg o Steel cut oats topped with apples & cinnamon and skim milk  o Fresh fruit: banana, strawberries, melon, berries, peaches  o Smoothies: strawberries, bananas, greek yogurt, peanut butter o Low fat greek yogurt with blueberries and granola  o Egg white omelet with spinach and mushrooms o Breakfast couscous: whole wheat couscous, apricots, skim milk, cranberries  . Sandwiches:  o Hummus and grilled vegetables (peppers, zucchini, squash) on whole wheat bread   o Grilled chicken on whole wheat pita with lettuce, tomatoes, cucumbers or tzatziki  o Tuna salad on whole wheat bread: tuna salad made with greek yogurt, olives, red peppers, capers, green onions o Garlic rosemary lamb pita: lamb sauted with garlic, rosemary, salt & pepper; add lettuce, cucumber, greek yogurt to pita - flavor with lemon juice and black pepper  . Seafood:  o Mediterranean grilled salmon, seasoned with garlic, basil, parsley, lemon juice and black pepper o Shrimp, lemon, and spinach whole-grain pasta salad made with low fat greek yogurt  o Seared scallops with lemon orzo  o Seared tuna steaks seasoned salt, pepper, coriander topped with tomato mixture of olives, tomatoes, olive oil, minced garlic, parsley, green onions and cappers  . Meats:  o Herbed greek chicken salad with kalamata olives, cucumber, feta  o Red bell peppers stuffed with spinach, bulgur, lean ground beef (or lentils) & topped with feta    o Kebabs: skewers of chicken, tomatoes, onions, zucchini, squash  o [place] burgers: made with red onions, mint, dill, lemon juice, feta cheese topped with roasted red peppers . Vegetarian o Cucumber salad: cucumbers, artichoke hearts, celery, red onion, feta cheese, tossed in olive oil & lemon juice  o Hummus and whole grain pita points with a greek salad (lettuce, tomato, feta, olives, cucumbers, red onion) o Lentil soup with celery, carrots made with vegetable broth, garlic, salt and pepper  o Tabouli salad: parsley, bulgur, mint, scallions, cucumbers, tomato, radishes, lemon juice, olive oil, salt and pepper.

## 2015-11-12 ENCOUNTER — Encounter: Payer: Self-pay | Admitting: Internal Medicine

## 2015-11-12 ENCOUNTER — Ambulatory Visit (INDEPENDENT_AMBULATORY_CARE_PROVIDER_SITE_OTHER): Payer: BC Managed Care – PPO | Admitting: Internal Medicine

## 2015-11-12 VITALS — BP 140/80 | HR 84 | Temp 98.1°F | Wt 195.6 lb

## 2015-11-12 DIAGNOSIS — Z79899 Other long term (current) drug therapy: Secondary | ICD-10-CM | POA: Diagnosis not present

## 2015-11-12 DIAGNOSIS — J45909 Unspecified asthma, uncomplicated: Secondary | ICD-10-CM | POA: Diagnosis not present

## 2015-11-12 NOTE — Progress Notes (Signed)
Pre visit review using our clinic review tool, if applicable. No additional management support is needed unless otherwise documented below in the visit note. 

## 2015-11-12 NOTE — Patient Instructions (Signed)
Asthma is a relative  Risk for scuba diving  Otherwise  No contraindications.  Would need to get input from  Dr Annamaria Boots    I can send him a message and see if he needs to see you further  For more information.

## 2015-11-12 NOTE — Progress Notes (Signed)
Chief Complaint  Patient presents with  . Medical Clearance    Would like to scuba dive    HPI: [name] 65 y.o. comes in today with paperwork for clearance to be able to take a scuba diving class at [place]. He has been physically active lately taken her boot camps and super exercising. He denies any exercise-induced wheezing his most recent asthma flare was related to allergy. It was not in the last month. He is on controller medication. He feels pretty good without any cardiovascular pulmonary symptoms. No history of heart attack pneumothorax. He does have some recurrent sinusitis and is uncertain whether he will be able to equilibrate but he is not congested at this time.  The class starts tomorrow  ROS: See pertinent positives and negatives per HPI.  Past Medical History:  Diagnosis Date  . Arthritis    knees  . Asthma   . Depression   . Elevated PSA    [place] and bx negative 2011  . Hypertension    nl cardiolite in 1998  . Pneumonia 2005   right hospitalized   . Rhinosinusitis   . Sleep apnea    wears CPAP    Family History  Problem Relation Age of Onset  . Asthma    . Hypertension    . Diabetes Maternal Grandmother   . Heart failure Father     MV replacement  . Healthy Sister   . Healthy Brother   . Healthy Brother   . Colon cancer Neg Hx   . Pancreatic cancer Neg Hx   . Stomach cancer Neg Hx   . Esophageal cancer Neg Hx     Social History   Social History  . Marital status: Single    Spouse name: N/A  . Number of children: N/A  . Years of education: N/A   Occupational History  . 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 Smoking status: Never Smoker  . Smokeless tobacco: Never Used  . Alcohol use No     Comment: Quit drinking ETOH 1986  . Drug use: Unknown  . Sexual activity: Not Asked   Other Topics Concern  . 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 ADVAIR DISKUS 100-50 MCG/DOSE AEPB INHALE 1 PUFF INTO THE LUNGS 2 (TWO) TIMES DAILY. 60 each 3  . amLODipine-benazepril (LOTREL) 5-10 MG capsule TAKE 1 CAPSULE BY MOUTH EVERY DAY 90 capsule 1  . ammonium lactate (AMLACTIN) 12 % cream Use as directed as needed     . aspirin 325 MG EC tablet Take 325 mg by mouth daily.      Marland Kitchen azelastine (ASTELIN) 137 MCG/SPRAY nasal spray USE AS DIRECTED 30 mL 5  . COMBIVENT RESPIMAT 20-100 MCG/ACT AERS respimat INHALE 2 PUFFS UP TO FOUR TIMES A DAY AS NEEDED 1 Inhaler 3  . escitalopram (LEXAPRO) 20 MG tablet TAKE 1 & 1/2 TABLET DAILY 135 tablet 1  . fexofenadine (ALLEGRA) 180 MG tablet Take 180 mg by mouth daily.      Marland Kitchen ibuprofen (ADVIL,MOTRIN) 800 MG tablet TAKE 1 TABLET BY MOUTH EVERY 8 HOURS AS NEEDED 60 tablet 1  . mometasone (NASONEX) 50 MCG/ACT nasal spray USE  1 TO 2 SPRAYS IN EACH NOSTRIL DAILY 17 g 3  . protriptyline (VIVACTIL) 10 MG tablet TAKE 1 TABLET (10 MG TOTAL) BY MOUTH AT BEDTIME. 30 tablet 5  . silodosin (RAPAFLO) 8 MG CAPS capsule Take 1 capsule (8 mg total) by mouth at bedtime. 30 capsule 11  . zafirlukast (ACCOLATE) 20 MG tablet TAKE 1 TABLET TWICE DAILY 60 tablet 0  . zafirlukast (ACCOLATE) 20 MG tablet TAKE 1 TABLET TWICE DAILY 180 tablet 0   No facility-administered medications prior to visit.      EXAM:  BP 140/80 (BP Location: Right Arm, Patient Position: Sitting, Cuff Size: Normal)   Pulse 84   Temp 98.1 F (36.7 C) (Oral)   Wt 195 lb 9.6 oz (88.7 kg)   SpO2 98%   BMI 33.06 kg/m   Body mass index is 33.06 kg/m.  GENERAL: vitals reviewed and listed above, alert, oriented, appears well hydrated and in no acute distress HEENT: atraumatic, conjunctiva  clear, no obvious abnormalities on inspection of external nose and ears MS: moves all extremities without noticeable focal  abnormality PSYCH: pleasant  and cooperative, no obvious depression or anxiety See last check up  Visit  8 4 .  ASSESSMENT AND PLAN:  Discussed the following assessment and plan:  Asthma, chronic, unspecified asthma severity, uncomplicated At this time things appear stable on controller medication however did have a sinus infection in April. His last pulmonary function tests were 2011  mild obstruction responsive to bronchodilators. He was at that time  on controller meds .  Seems to be doing  Well with his land exercise   -Patient advised to return or notify health care team  if symptoms worsen ,persist or new concerns arise.  Patient Instructions  Asthma is a relative  Risk for scuba diving  Otherwise  No contraindications.  Would need to get input from  Dr Annamaria Boots    I can send him a message and see if he needs to see you further  For more information.     Standley Brooking. Stuart Mirabile M.D.

## 2015-11-14 ENCOUNTER — Other Ambulatory Visit: Payer: Self-pay | Admitting: Internal Medicine

## 2015-11-19 ENCOUNTER — Encounter: Payer: Self-pay | Admitting: Internal Medicine

## 2015-11-24 ENCOUNTER — Other Ambulatory Visit: Payer: Self-pay | Admitting: Internal Medicine

## 2015-12-06 ENCOUNTER — Telehealth: Payer: Self-pay | Admitting: Internal Medicine

## 2015-12-06 MED ORDER — AZITHROMYCIN 250 MG PO TABS: ORAL_TABLET | ORAL | 0 refills | Status: AC

## 2015-12-06 NOTE — Telephone Encounter (Signed)
Spoke with pt. He is aware of CY's recommendations. Rx has been sent in. Nothing further was needed.

## 2015-12-06 NOTE — Telephone Encounter (Signed)
Message will be routed to [facility] as Caryl Pina has gotten all pertinent information.  CY - please advise. Thanks.

## 2015-12-06 NOTE — Telephone Encounter (Signed)
Pt c/o increased SOB, prod cough with yellow mucus, sinus congestion, PND, headache, sinus congestion X1 week.  Pt denies fever, chest pain, wheezing.   Pt has been taking Advair, allegra to help with s/s.  Pt requesting further recs. Pharmacy: [facility].

## 2015-12-06 NOTE — Telephone Encounter (Signed)
Suggest Z pak OTC Mucinex DM

## 2015-12-20 ENCOUNTER — Ambulatory Visit: Payer: BC Managed Care – PPO | Admitting: Internal Medicine

## 2015-12-23 ENCOUNTER — Other Ambulatory Visit: Payer: Self-pay | Admitting: Internal Medicine

## 2015-12-24 ENCOUNTER — Other Ambulatory Visit: Payer: Self-pay | Admitting: Internal Medicine

## 2015-12-24 NOTE — Telephone Encounter (Signed)
Sent to the pharmacy by e-scribe. 

## 2015-12-27 ENCOUNTER — Ambulatory Visit (INDEPENDENT_AMBULATORY_CARE_PROVIDER_SITE_OTHER): Payer: BC Managed Care – PPO | Admitting: Internal Medicine

## 2015-12-27 ENCOUNTER — Encounter: Payer: Self-pay | Admitting: Internal Medicine

## 2015-12-27 VITALS — BP 118/68 | HR 74 | Ht 65.0 in | Wt 199.2 lb

## 2015-12-27 DIAGNOSIS — J45998 Other asthma: Secondary | ICD-10-CM

## 2015-12-27 DIAGNOSIS — J4520 Mild intermittent asthma, uncomplicated: Secondary | ICD-10-CM | POA: Diagnosis not present

## 2015-12-27 DIAGNOSIS — G4733 Obstructive sleep apnea (adult) (pediatric): Secondary | ICD-10-CM | POA: Diagnosis not present

## 2015-12-27 LAB — NITRIC OXIDE: NITRIC OXIDE: 30

## 2015-12-27 NOTE — Progress Notes (Signed)
Subjective:    Patient ID: [name], male    DOB: 1950/09/15, 65 y.o.   MRN: [id number]  HPI M Never smoker followed for OSA, hx asthma   12/20/14- 62 yoM never smoker followed for OSA with hypersomnia, hx asthma,  CPAP 15/ Advanced FOLLOWS FOR:  pt states he is doing good. pt states he had a problem with the hose of his CPAP where it stretches and doesn't  stay connected to the mask.  pt needs a script for that part. pt using CPAP everynight for about 6 - 8 hours. mask and presssure good for pt. DME: [facility] Denies any problems with asthma, not needing rescue inhaler. No wheezing. It doesn't disturb his sleep.  12/27/2015-65 year old male never smoker followed for OSA with hypersomnia, history asthma, allergic rhinitis, complicated by HBP, RBBB CPAP 15/Advanced FOLLOWS FOR: DME: [facility]. Pt states he wears CPAP nightly and at nap time. DL attached.  Advair 100, Combivent Respimat, Accolate Astelin, Nasonex Very comfortable with CPAP 15. Download shows excellent compliance and control. He feels better using CPAP and denies sleep disturbance. We did discuss use of oral appliance for travel and he is going to ask his family dentist. Now taking scuba class and we discussed asthma control for this. He has been breathing very well continuing meds. Exercises regularly at [facility] without wheeze or unusual dyspnea. Office spirometry 12/26/2015-WNL-FVC 3.86/101%, FEV1 2.74/96%, ratio 0.71, 25-75 percent 1.82/78%. FENO-12/26/2015- 30 (mildly elevated)  ROS-see HPI Constitutional:   No-   weight loss, night sweats, fevers, chills, +fatigue, lassitude. HEENT:   No-  headaches, difficulty swallowing, tooth/dental problems, sore throat,       No-  sneezing, itching, ear ache, nasal congestion, post nasal drip,  CV:  No-   chest pain, orthopnea, PND, swelling in lower extremities, anasarca, dizziness, palpitations Resp: No-   shortness of breath with exertion or at rest.              No-   productive  cough,  No non-productive cough,  No- coughing up of blood.              No-   change in color of mucus.  No- wheezing.   Skin: No-   rash or lesions. GI:  No-   heartburn, indigestion, abdominal pain, nausea, vomiting,  GU: n. MS:  No-   joint pain or swelling.   Neuro-  +early Parkinson's Psych:  No- change in mood or affect. No depression or anxiety.  No memory loss.  OBJ- Physical Exam General- Alert, Oriented, Affect-appropriate, Distress- none acute, + overweight Skin- rash-none, lesions- none, excoriation- none Lymphadenopathy- none Head- atraumatic            Eyes- Gross vision intact, PERRLA, conjunctivae and secretions clear            Ears- Hearing, canals-normal            Nose- Clear, + external dev, no- mucus, polyps, erosion, perforation             Throat- Mallampati II , mucosa clear , drainage- none, tonsils- atrophic Neck- flexible , trachea midline, no stridor , thyroid nl, carotid no bruit Chest - symmetrical excursion , unlabored           Heart/CV- RRR , no murmur , no gallop  , no rub, nl s1 s2                           -  JVD- none , edema- none, stasis changes- none, varices- none           Lung- clear to P&A, wheeze- none, cough- none , dullness-none, rub- none           Chest wall-  Abd-  Br/ Gen/ Rectal- Not done, not indicated Extrem- cyanosis- none, clubbing, none, atrophy- none, strength- nl Neuro- "masked facies"

## 2015-12-27 NOTE — Patient Instructions (Addendum)
Order- FENO     Dx asthma mild intermittent  Order- Office spirometry  You can ask your dentist if they make fitted oral appliances to treat obstructive sleep apnea. If not we can refer you to someone. tis might be an option for use while camping, if you can't find a portable power source that would work well.   Order - DME Advanced- needs replacement supplies please. Continue CPAP 15, mask of choice, humidifier, supplies, AirView    Dx OSA  Flu vax

## 2015-12-28 ENCOUNTER — Encounter: Payer: Self-pay | Admitting: *Deleted

## 2015-12-29 NOTE — Assessment & Plan Note (Signed)
Excellent compliance and control confirmed by download. He says he can't sleep without CPAP. Quality of life definitely improved.

## 2015-12-29 NOTE — Assessment & Plan Note (Signed)
Discussed asthma as a risk during scuba diving. His airflow is good. I did suggest he uses his rescue inhaler shortly before diving if any question

## 2015-12-30 ENCOUNTER — Other Ambulatory Visit: Payer: Self-pay | Admitting: Internal Medicine

## 2016-01-01 NOTE — Telephone Encounter (Signed)
Sent to the pharmacy by e-scribe. 

## 2016-01-02 ENCOUNTER — Other Ambulatory Visit (INDEPENDENT_AMBULATORY_CARE_PROVIDER_SITE_OTHER): Payer: BC Managed Care – PPO

## 2016-01-02 DIAGNOSIS — D721 Eosinophilia, unspecified: Secondary | ICD-10-CM

## 2016-01-02 DIAGNOSIS — Z1159 Encounter for screening for other viral diseases: Secondary | ICD-10-CM | POA: Diagnosis not present

## 2016-01-02 LAB — CBC WITH DIFFERENTIAL/PLATELET
BASOS ABS: 0 10*3/uL (ref 0.0–0.1)
Basophils Relative: 0.3 % (ref 0.0–3.0)
EOS ABS: 1 10*3/uL — AB (ref 0.0–0.7)
Eosinophils Relative: 14.2 % — ABNORMAL HIGH (ref 0.0–5.0)
HEMATOCRIT: 42.7 % (ref 39.0–52.0)
Hemoglobin: 14.7 g/dL (ref 13.0–17.0)
LYMPHS PCT: 15 % (ref 12.0–46.0)
Lymphs Abs: 1.1 10*3/uL (ref 0.7–4.0)
MCHC: 34.5 g/dL (ref 30.0–36.0)
MCV: 102.7 fl — ABNORMAL HIGH (ref 78.0–100.0)
MONOS PCT: 13.1 % — AB (ref 3.0–12.0)
Monocytes Absolute: 0.9 10*3/uL (ref 0.1–1.0)
NEUTROS ABS: 4 10*3/uL (ref 1.4–7.7)
Neutrophils Relative %: 57.4 % (ref 43.0–77.0)
Platelets: 178 10*3/uL (ref 150.0–400.0)
RBC: 4.15 Mil/uL — AB (ref 4.22–5.81)
RDW: 15 % (ref 11.5–15.5)
WBC: 7 10*3/uL (ref 4.0–10.5)

## 2016-01-02 LAB — VITAMIN B12: Vitamin B-12: 438 pg/mL (ref 211–911)

## 2016-01-03 LAB — EOSINOPHIL COUNT: Eosinophils Absolute: 1060 cells/uL — ABNORMAL HIGH (ref 15–500)

## 2016-01-03 LAB — HEPATITIS C ANTIBODY: HCV Ab: NEGATIVE

## 2016-01-03 LAB — PATHOLOGIST SMEAR REVIEW

## 2016-01-07 ENCOUNTER — Encounter: Payer: Self-pay | Admitting: Internal Medicine

## 2016-01-09 ENCOUNTER — Encounter: Payer: Self-pay | Admitting: Internal Medicine

## 2016-01-23 ENCOUNTER — Other Ambulatory Visit: Payer: Self-pay | Admitting: Internal Medicine

## 2016-02-14 ENCOUNTER — Other Ambulatory Visit: Payer: Self-pay | Admitting: Internal Medicine

## 2016-02-15 NOTE — Telephone Encounter (Signed)
Ok to refill x 2  

## 2016-02-24 ENCOUNTER — Other Ambulatory Visit: Payer: Self-pay | Admitting: Internal Medicine

## 2016-02-26 NOTE — Telephone Encounter (Signed)
CY Please advise if okay to refill  Pt last seen 12/27/15  Last refilled 08/20/15 with 5 refills. Thanks

## 2016-02-26 NOTE — Telephone Encounter (Signed)
Refill x 1 year 

## 2016-04-18 ENCOUNTER — Other Ambulatory Visit: Payer: Self-pay | Admitting: Internal Medicine

## 2016-04-28 ENCOUNTER — Other Ambulatory Visit: Payer: Self-pay | Admitting: Internal Medicine

## 2016-05-01 NOTE — Telephone Encounter (Signed)
Sent to the pharmacy by e-scribe. 

## 2016-05-26 ENCOUNTER — Telehealth: Payer: Self-pay | Admitting: *Deleted

## 2016-05-26 DIAGNOSIS — D72119 Hypereosinophilic syndrome (hes), unspecified: Secondary | ICD-10-CM

## 2016-05-26 DIAGNOSIS — D721 Eosinophilia: Principal | ICD-10-CM

## 2016-05-26 NOTE — Telephone Encounter (Signed)
-----   Message from Deneise Lever, MD sent at 05/25/2016  7:35 PM EST ----- Joellen Jersey- Dr Regis Bill has noted increasing eosinophil count. Can you arrange for him to have labs drawn for Allergy Profile, ANCA, CBC w diff, ACE level, sed rate and ANA also CXR for dx hypereosinophilia. Please see if we can bring him back in in next 2-3 weeks, with FENO and office spirometry.   ----- Message ----- From: Burnis Medin, MD Sent: 05/25/2016   1:25 PM To: Deneise Lever, MD  Clint   I have been following Aquilla Hacker elevated  Eosinophil count for a while and seems to be creeping up. So not as comfortable with just   Following.   DO you think could all be attributed to his allergic condition? Do you think should  Get more evaluation ? Hematology or other ?  I Will be seeing him again in  AUgust .  Thanks for your opinion  W

## 2016-05-26 NOTE — Telephone Encounter (Signed)
Spoke with patient-he will come by this week to have labs and CXR. He will follow up with [place] on Wed 06/11/16 at 11:30am. Orders placed.

## 2016-05-28 ENCOUNTER — Ambulatory Visit (INDEPENDENT_AMBULATORY_CARE_PROVIDER_SITE_OTHER)
Admission: RE | Admit: 2016-05-28 | Discharge: 2016-05-28 | Disposition: A | Discharge status: Home / Self Care | Payer: BC Managed Care – PPO | Source: Ambulatory Visit | Attending: Internal Medicine | Admitting: Internal Medicine

## 2016-05-28 ENCOUNTER — Other Ambulatory Visit (INDEPENDENT_AMBULATORY_CARE_PROVIDER_SITE_OTHER): Payer: BC Managed Care – PPO

## 2016-05-28 DIAGNOSIS — D721 Eosinophilia: Secondary | ICD-10-CM

## 2016-05-28 DIAGNOSIS — D72119 Hypereosinophilic syndrome (hes), unspecified: Secondary | ICD-10-CM

## 2016-05-28 LAB — CBC WITH DIFFERENTIAL/PLATELET
BASOS ABS: 0 10*3/uL (ref 0.0–0.1)
BASOS PCT: 0.1 % (ref 0.0–3.0)
Eosinophils Absolute: 0 10*3/uL (ref 0.0–0.7)
Eosinophils Relative: 0.6 % (ref 0.0–5.0)
HEMATOCRIT: 41 % (ref 39.0–52.0)
Hemoglobin: 14.1 g/dL (ref 13.0–17.0)
LYMPHS ABS: 0.4 10*3/uL — AB (ref 0.7–4.0)
Lymphocytes Relative: 5.4 % — ABNORMAL LOW (ref 12.0–46.0)
MCHC: 34.4 g/dL (ref 30.0–36.0)
MCV: 102.2 fl — ABNORMAL HIGH (ref 78.0–100.0)
MONO ABS: 0.1 10*3/uL (ref 0.1–1.0)
Monocytes Relative: 1.3 % — ABNORMAL LOW (ref 3.0–12.0)
NEUTROS ABS: 6 10*3/uL (ref 1.4–7.7)
PLATELETS: 162 10*3/uL (ref 150.0–400.0)
RBC: 4.02 Mil/uL — ABNORMAL LOW (ref 4.22–5.81)
RDW: 14.5 % (ref 11.5–15.5)
WBC: 6.5 10*3/uL (ref 4.0–10.5)

## 2016-05-28 LAB — SEDIMENTATION RATE: SED RATE: 4 mm/h (ref 0–20)

## 2016-05-29 LAB — RESPIRATORY ALLERGY PROFILE REGION II ~~LOC~~
ALLERGEN, D PTERNOYSSINUS, D1: 10.4 kU/L — AB
Allergen, A. alternata, m6: <0.1 kU/L
Allergen, Cedar tree, t12: <0.1 kU/L
Allergen, Comm Silver Birch, t9: <0.1 kU/L
Allergen, Cottonwood, t14: <0.1 kU/L
Allergen, Mouse Urine Protein, e78: <0.1 kU/L
Allergen, Oak,t7: <0.1 kU/L
Allergen, P. notatum, m1: <0.1 kU/L
Aspergillus fumigatus, m3: 0.58 kU/L — ABNORMAL HIGH
Box Elder IgE: <0.1 kU/L
Cat Dander: 0.32 kU/L — ABNORMAL HIGH
Common Ragweed: <0.1 kU/L
D. FARINAE: 8.37 kU/L — AB
DOG DANDER: 0.41 kU/L — AB
IgE (Immunoglobulin E), Serum: 88 kU/L (ref <115)
PECAN/HICKORY TREE IGE: 0.2 kU/L — AB
Rough Pigweed  IgE: <0.1 kU/L
Sheep Sorrel IgE: <0.1 kU/L

## 2016-05-29 LAB — ANCA SCREEN W REFLEX TITER: ANCA Screen: NEGATIVE

## 2016-05-29 LAB — ANA: Anti Nuclear Antibody(ANA): POSITIVE — AB

## 2016-05-29 LAB — ANTI-NUCLEAR AB-TITER (ANA TITER)

## 2016-05-29 LAB — ANGIOTENSIN CONVERTING ENZYME: Angiotensin-Converting Enzyme: 30 U/L (ref 9–67)

## 2016-06-11 ENCOUNTER — Other Ambulatory Visit (INDEPENDENT_AMBULATORY_CARE_PROVIDER_SITE_OTHER): Payer: BC Managed Care – PPO

## 2016-06-11 ENCOUNTER — Ambulatory Visit (INDEPENDENT_AMBULATORY_CARE_PROVIDER_SITE_OTHER): Payer: BC Managed Care – PPO | Admitting: Internal Medicine

## 2016-06-11 ENCOUNTER — Encounter: Payer: Self-pay | Admitting: Internal Medicine

## 2016-06-11 ENCOUNTER — Other Ambulatory Visit: Payer: Self-pay

## 2016-06-11 VITALS — BP 120/72 | HR 77 | Ht 65.0 in | Wt 198.8 lb

## 2016-06-11 DIAGNOSIS — G4733 Obstructive sleep apnea (adult) (pediatric): Secondary | ICD-10-CM

## 2016-06-11 DIAGNOSIS — J4520 Mild intermittent asthma, uncomplicated: Secondary | ICD-10-CM | POA: Diagnosis not present

## 2016-06-11 DIAGNOSIS — J4540 Moderate persistent asthma, uncomplicated: Secondary | ICD-10-CM

## 2016-06-11 DIAGNOSIS — D721 Eosinophilia, unspecified: Secondary | ICD-10-CM | POA: Insufficient documentation

## 2016-06-11 LAB — CBC WITH DIFFERENTIAL/PLATELET
BASOS PCT: 0.6 % (ref 0.0–3.0)
Basophils Absolute: 0.1 10*3/uL (ref 0.0–0.1)
Eosinophils Absolute: 0.7 10*3/uL (ref 0.0–0.7)
Eosinophils Relative: 7.9 % — ABNORMAL HIGH (ref 0.0–5.0)
HEMATOCRIT: 41.1 % (ref 39.0–52.0)
Hemoglobin: 14.3 g/dL (ref 13.0–17.0)
Lymphocytes Relative: 13.8 % (ref 12.0–46.0)
Lymphs Abs: 1.3 10*3/uL (ref 0.7–4.0)
MCHC: 34.7 g/dL (ref 30.0–36.0)
MCV: 101.9 fl — AB (ref 78.0–100.0)
MONOS PCT: 13.3 % — AB (ref 3.0–12.0)
Monocytes Absolute: 1.3 10*3/uL — ABNORMAL HIGH (ref 0.1–1.0)
NEUTROS ABS: 6.1 10*3/uL (ref 1.4–7.7)
Neutrophils Relative %: 64.4 % (ref 43.0–77.0)
PLATELETS: 174 10*3/uL (ref 150.0–400.0)
RBC: 4.04 Mil/uL — ABNORMAL LOW (ref 4.22–5.81)
RDW: 15 % (ref 11.5–15.5)
WBC: 9.5 10*3/uL (ref 4.0–10.5)

## 2016-06-11 NOTE — Patient Instructions (Signed)
Order- office spirometry  Dx asthma mild intermittent  Order- lab CBC w diff   Please call as needed

## 2016-06-11 NOTE — Assessment & Plan Note (Signed)
He seems to be doing very well with no active airway obstruction now. Okay to go ahead with scuba diving in the presence of other divers. He has not dive if he is having active asthma. If any question at all use rescue inhaler before diving

## 2016-06-11 NOTE — Progress Notes (Signed)
Subjective:    Patient ID: [name], male    DOB: [birth year], 66 y.o.   MRN: [id number]  HPI male never smoker followed for OSA with hypersomnia, history asthma, allergic rhinitis, complicated by HBP, RBBB CPAP 15/Advanced Eos- 1.0, 1.0, 1.06, 0, 0.7 ( Nl 0.15- 0.5) Office spirometry 12/26/2015-WNL-FVC 3.86/101%, FEV1 2.74/96%, ratio 0.71, 25-75 percent 1.82/78%. FENO-12/26/2015- 30 (mildly elevated) ANCA- neg ANA + 1:80 Respiratory Allergy Profile 06/11/16--total IgE WNL 88 Office Spirometry 06/11/2016-WNL-FVC 3.95/103%, FEV1 2.71/95%, ratio 0.69, FEF 25-75% 1.60/ 69% -----------------------------------------------------------------------------------  12/27/2015-66 year old male never smoker followed for OSA with hypersomnia, history asthma, allergic rhinitis, complicated by HBP, RBBB CPAP 15/Advanced FOLLOWS FOR: DME: [facility]. Pt states he wears CPAP nightly and at nap time. DL attached.  Advair 100, Combivent Respimat, Accolate Astelin, Nasonex Very comfortable with CPAP 15. Download shows excellent compliance and control. He feels better using CPAP and denies sleep disturbance. We did discuss use of oral appliance for travel and he is going to ask his family dentist. Now taking scuba class and we discussed asthma control for this. He has been breathing very well continuing meds. Exercises regularly at [facility] without wheeze or unusual dyspnea. Office spirometry 12/26/2015-WNL-FVC 3.86/101%, FEV1 2.74/96%, ratio 0.71, 25-75 percent 1.82/78%. FENO-12/26/2015- 30 (mildly elevated)  06/11/2016-66 year old male never smoker followed for OSA with hypersomnia, history asthma, allergic rhinitis, complicated by HBP, RBBB CPAP 15/Advanced FOLLOWS FOR: DME: [facility]. Pt wears CPAP nightly and no new supplies needed at this time. No recent DL. Pt states he has been doing well with everyone being sick. He asks update his approval for scuba diving. Dr. Regis Bill had notified me of increased eosinophilia and  we had outpatient labs done 05/28/16- CBC w diff  01/02/16- EOS 1.06 H CBC w diff  05/28/16- WBC 6,500, , 92.6% PMN, 5.4% Lymphs, Eos 0.0 CXR 05/28/2016- No active cardiopulmonary disease. ANA- Pos 1:80 Sed rate - 4 ACE- WNL 30  ANCA  Negative Respiratory Allergy Profile-total IgE WNL 88, mild elevations for dust mite, cat, dog, aspergillus fumigatus, hickory tree pollen He reports feeling very well, going to gym regularly for the past 3 or 4 years. He needed a form completed so he could take an advanced scuba course after doing very well with beginning course last year. He has not had any recent illness and denies rashes, adenopathy, fevers  ROS-see HPI Constitutional:   No-   weight loss, night sweats, fevers, chills, fatigue, lassitude. HEENT:   No-  headaches, difficulty swallowing, tooth/dental problems, sore throat,       No-  sneezing, itching, ear ache, nasal congestion, post nasal drip,  CV:  No-   chest pain, orthopnea, PND, swelling in lower extremities, anasarca, dizziness, palpitations Resp: No-   shortness of breath with exertion or at rest.              No-   productive cough,  No non-productive cough,  No- coughing up of blood.              No-   change in color of mucus.  No- wheezing.   Skin: No-   rash or lesions. GI:  No-   heartburn, indigestion, abdominal pain, nausea, vomiting,  GU: n. MS:  No-   joint pain or swelling.   Neuro-  +early Parkinson's Psych:  No- change in mood or affect. No depression or anxiety.  No memory loss.  OBJ- Physical Exam General- Alert, Oriented, Affect-appropriate, Distress- none acute,  Skin- rash-none, lesions- none, excoriation- none Lymphadenopathy-  none Head- atraumatic            Eyes- Gross vision intact, PERRLA, conjunctivae and secretions clear            Ears- Hearing, canals-normal            Nose- Clear, + external dev, no- mucus, polyps, erosion, perforation             Throat- Mallampati II , mucosa clear , drainage-  none, tonsils- atrophic Neck- flexible , trachea midline, no stridor , thyroid nl, carotid no bruit Chest - symmetrical excursion , unlabored           Heart/CV- RRR , no murmur , no gallop  , no rub, nl s1 s2                           - JVD- none , edema- none, stasis changes- none, varices- none           Lung- clear to P&A, wheeze- none, cough- none , dullness-none, rub- none           Chest wall-  Abd-  Br/ Gen/ Rectal- Not done, not indicated Extrem- cyanosis- none, clubbing, none, atrophy- none, strength- nl, + surgical scar L knee Neuro-non-focal

## 2016-06-11 NOTE — Assessment & Plan Note (Signed)
He reports good continued compliance and comfortable control with CPAP 15/Advanced. No download available today. Plan-he can continue CPAP

## 2016-06-11 NOTE — Assessment & Plan Note (Signed)
Mild but fairly stable absolute and relative eosinophil elevations.. There is not a definite trend and he does not recognize any associated symptoms. Uncertain relation to insignificantly elevated ANA 1:80. I suspect he may have had a viral infection altering differential count in late February so we will recheck in a couple of weeks.

## 2016-06-12 ENCOUNTER — Other Ambulatory Visit: Payer: Self-pay | Admitting: Internal Medicine

## 2016-06-24 ENCOUNTER — Telehealth: Payer: Self-pay | Admitting: Internal Medicine

## 2016-06-24 NOTE — Progress Notes (Signed)
Spoke with patient and informed him of results. Pt did not have any questions regarding spirometry. Nothing further is needed.

## 2016-06-24 NOTE — Telephone Encounter (Signed)
Pt wanted to know if the steroid shots he received could be affecting his eosinophil count. Dr. Annamaria Boots please advise.

## 2016-06-24 NOTE — Telephone Encounter (Signed)
Steroids will reduce eosinophils. That's why I wanted to recheck CBC with diff a month or so after last steroids.

## 2016-06-24 NOTE — Telephone Encounter (Signed)
Pt stated that his last injection has been about 1 month ago from the ortho doctor.  He is aware that a pending lab is in the computer and he can come in anytime for those labs.

## 2016-06-25 ENCOUNTER — Other Ambulatory Visit (INDEPENDENT_AMBULATORY_CARE_PROVIDER_SITE_OTHER): Payer: BC Managed Care – PPO

## 2016-06-25 DIAGNOSIS — J4520 Mild intermittent asthma, uncomplicated: Secondary | ICD-10-CM | POA: Diagnosis not present

## 2016-06-25 LAB — CBC WITH DIFFERENTIAL/PLATELET
BASOS PCT: 0.4 % (ref 0.0–3.0)
Basophils Absolute: 0 10*3/uL (ref 0.0–0.1)
EOS ABS: 1.1 10*3/uL — AB (ref 0.0–0.7)
Eosinophils Relative: 12 % — ABNORMAL HIGH (ref 0.0–5.0)
HCT: 41.9 % (ref 39.0–52.0)
Hemoglobin: 14.3 g/dL (ref 13.0–17.0)
LYMPHS ABS: 1.5 10*3/uL (ref 0.7–4.0)
Lymphocytes Relative: 15.6 % (ref 12.0–46.0)
MCHC: 34.1 g/dL (ref 30.0–36.0)
MCV: 102.5 fl — ABNORMAL HIGH (ref 78.0–100.0)
MONO ABS: 1 10*3/uL (ref 0.1–1.0)
Monocytes Relative: 10.3 % (ref 3.0–12.0)
Neutro Abs: 5.8 10*3/uL (ref 1.4–7.7)
Neutrophils Relative %: 61.7 % (ref 43.0–77.0)
PLATELETS: 207 10*3/uL (ref 150.0–400.0)
RBC: 4.09 Mil/uL — ABNORMAL LOW (ref 4.22–5.81)
RDW: 14.7 % (ref 11.5–15.5)
WBC: 9.4 10*3/uL (ref 4.0–10.5)

## 2016-06-26 ENCOUNTER — Other Ambulatory Visit: Payer: Self-pay | Admitting: Internal Medicine

## 2016-06-26 DIAGNOSIS — D721 Eosinophilia, unspecified: Secondary | ICD-10-CM

## 2016-07-08 ENCOUNTER — Telehealth: Payer: Self-pay | Admitting: Hematology

## 2016-07-08 ENCOUNTER — Encounter: Payer: Self-pay | Admitting: Hematology

## 2016-07-08 NOTE — Telephone Encounter (Signed)
Pt returned my call to schedule a hem appt. Appt has been scheduled for the pt to see Dr. Irene Limbo on 4/26 at 3pm. Pt aware to arrive 30 minutes early. Will mail the pt a letter.

## 2016-07-24 ENCOUNTER — Ambulatory Visit (HOSPITAL_BASED_OUTPATIENT_CLINIC_OR_DEPARTMENT_OTHER): Payer: BC Managed Care – PPO | Admitting: Hematology

## 2016-07-24 ENCOUNTER — Encounter: Payer: Self-pay | Admitting: Hematology

## 2016-07-24 ENCOUNTER — Telehealth: Payer: Self-pay | Admitting: Hematology

## 2016-07-24 VITALS — BP 136/78 | HR 81 | Temp 98.2°F | Resp 18 | Ht 65.0 in | Wt 201.1 lb

## 2016-07-24 DIAGNOSIS — D721 Eosinophilia, unspecified: Secondary | ICD-10-CM

## 2016-07-24 DIAGNOSIS — D7589 Other specified diseases of blood and blood-forming organs: Secondary | ICD-10-CM

## 2016-07-24 NOTE — Patient Instructions (Signed)
Thank you for choosing [facility] to provide your oncology and hematology care.  To afford each patient quality time with our providers, please arrive 30 minutes before your scheduled appointment time.  If you arrive late for your appointment, you may be asked to reschedule.  We strive to give you quality time with our providers, and arriving late affects you and other patients whose appointments are after yours.  If you are a no show for multiple scheduled visits, you may be dismissed from the clinic at the providers discretion.   Again, thank you for choosing [facility], our hope is that these requests will decrease the amount of time that you wait before being seen by our physicians.  ______________________________________________________________________ Should you have questions after your visit to the [facility], please contact our office at [phone] between the hours of 8:30 and 4:30 p.m.    Voicemails left after 4:30p.m will not be returned until the following business day.   For prescription refill requests, please have your pharmacy contact us directly.  Please also try to allow 48 hours for prescription requests.   Please contact the scheduling department for questions regarding scheduling.  For scheduling of procedures such as PET scans, CT scans, MRI, Ultrasound, etc please contact central scheduling at [phone].   Resources For Cancer Patients and Caregivers:  American Cancer Society:  800-227-2345  Can help patients locate various types of support and financial assistance Cancer Care: 1-800-813-HOPE (4673) Provides financial assistance, online support groups, medication/co-pay assistance.   [place] DSS:  336-641-3447 Where to apply for food stamps, Medicaid, and utility assistance Medicare Rights Center: 800-333-4114 Helps people with Medicare understand their rights and benefits, navigate the Medicare system, and secure the  quality healthcare they deserve SCAT: 336-333-6589 [employer] [employer]'s shared-ride transportation service for eligible riders who have a disability that prevents them from riding the fixed route bus.   For additional information on assistance programs please contact our social worker:   Grier Hock/Abigail Elmore:  [phone] 

## 2016-07-24 NOTE — Progress Notes (Signed)
Marland Kitchen    HEMATOLOGY/ONCOLOGY CONSULTATION NOTE  Date of Service: 07/24/2016  Patient Care Team: Burnis Medin, MD as PCP - General Orion Crook, MD (Inactive) (Urology) orthopedic Festus Aloe, MD as Attending Physician (Urology) Deneise Lever, MD as Attending Physician (Pulmonary Disease)  CHIEF COMPLAINTS/PURPOSE OF CONSULTATION:  Eosinophilia  HISTORY OF PRESENTING ILLNESS:   [name] is a wonderful 66 y.o. male who has been referred to [place] by Dr .Lottie Dawson, MD for evaluation and management of Eosinophilia.  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 He notes that he has been a lifelong nonsmoker and quit using alcohol in 1980s.  Patient reports that he has had chronically elevated eosinophil counts for years.  His recent labs with his primary care physician and pulmonologist have shown normal total WBC counts, normal hemoglobin with some macrocytosis and MCV of 10.5, normal platelet count but elevated absolute eosinophil count but have ranged between 700 and 1100 from at least July 2013 with no pattern of progressive increase. He had one eosinophil value of 0 in February 2018 likely in the setting of a viral infection.  Patient has been on inhaled steroids for his asthma as well as nasal steroids for his seasonal allergies. He also has been on chronic leukotiene inhibitor therapy (Zakirlukast) for >10 yrs per his report - this itself can cause chronic eosinophilia. The use of aspirin-like NSAIDs can also trigger eosinophilia.  Patient has no skin rashes or fixed lesions. No lymphadenopathy. No fevers no chills no night sweats. No unexplained weight loss. Has had ANA positivity but no overt diagnosis of an autoimmune condition. No recent travel history outside the [place] for concerns of parasitic  infections. Patient notes that he has had a hernia repair and has a mesh in his abdominal wall.   MEDICAL HISTORY:  Past Medical History:  Diagnosis Date  . Arthritis    knees  . Asthma   . Depression   . Elevated PSA    [place] and bx negative 2011  . Hypertension    nl cardiolite in 1998  . Pneumonia 2005   right hospitalized   . Rhinosinusitis   . Sleep apnea    wears CPAP    SURGICAL HISTORY: Past Surgical History:  Procedure Laterality Date  . FACIAL RECONSTRUCTION SURGERY    . NASAL SINUS SURGERY    . 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 shoulder atrhroscopic surgery     Right  . VENTRAL HERNIA REPAIR  03-2007   rerepair winter 2010    SOCIAL HISTORY: Social History   Social History  . Marital status: Single    Spouse name: N/A  . Number of children: N/A  . Years of education: N/A   Occupational History  . 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 Smoking status: Never Smoker  . Smokeless tobacco: Never Used  . Alcohol use No     Comment: Quit drinking ETOH 1986  . Drug use: Unknown  . Sexual activity: Not on file   Other Topics Concern  . 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 Heart failure Father     MV replacement  . Asthma    . Hypertension    . Diabetes Maternal Grandmother   . Healthy Sister   . Healthy Brother   . Healthy Brother   . Colon cancer Neg Hx   . Pancreatic cancer Neg Hx   . Stomach cancer Neg Hx   . Esophageal cancer Neg Hx     ALLERGIES:  is allergic to eggs or egg-derived products.  MEDICATIONS:  Current Outpatient Prescriptions  Medication Sig Dispense Refill  . ADVAIR DISKUS 100-50 MCG/DOSE AEPB INHALE 1 PUFF INTO THE LUNGS 2 (TWO) TIMES DAILY. 60 each 6  .  amLODipine-benazepril (LOTREL) 5-10 MG capsule TAKE 1 CAPSULE BY MOUTH EVERY DAY 90 capsule 2  . ammonium lactate (AMLACTIN) 12 % cream Use as directed as needed     . aspirin 325 MG EC tablet Take 325 mg by mouth daily.      Marland Kitchen azelastine (ASTELIN) 137 MCG/SPRAY nasal spray USE AS DIRECTED 30 mL 5  . COMBIVENT RESPIMAT 20-100 MCG/ACT AERS respimat INHALE 2 PUFFS UP TO FOUR TIMES A DAY AS NEEDED 4 g 3  . escitalopram (LEXAPRO) 20 MG tablet TAKE 1 & 1/2 TABLETS DAILY 135 tablet 2  . fexofenadine (ALLEGRA) 180 MG tablet Take 180 mg by mouth daily.      Marland Kitchen ibuprofen (ADVIL,MOTRIN) 800 MG tablet TAKE 1 TABLET BY MOUTH EVERY 8 HOURS AS NEEDED 60 tablet 1  . mometasone (NASONEX) 50 MCG/ACT nasal spray USE 1 TO 2 SPRAYS IN EACH NOSTRIL DAILY 17 g 3  . protriptyline (VIVACTIL) 10 MG tablet TAKE 1 TABLET (10 MG TOTAL) BY MOUTH AT BEDTIME. 30 tablet 11  . silodosin (RAPAFLO) 8 MG CAPS capsule Take 1 capsule (8 mg total) by mouth at bedtime. 30 capsule 11  . zafirlukast (ACCOLATE) 20 MG tablet TAKE 1 TABLET TWICE DAILY 60 tablet 0   No current facility-administered medications for this visit.     REVIEW OF SYSTEMS:    10 Point review of Systems was done is negative except as noted above.  PHYSICAL EXAMINATION: ECOG PERFORMANCE STATUS: 1 - Symptomatic but completely ambulatory  . Vitals:   07/24/16 1512  BP: 136/78  Pulse: 81  Resp: 18  Temp: 98.2 F (36.8 C)   Filed Weights   07/24/16 1512  Weight: 201 lb 1.6 oz (91.2 kg)   .Body mass index is 33.46 kg/m.  GENERAL:alert, in no acute distress and comfortable SKIN: no acute rashes, no significant lesions EYES: conjunctiva are pink and non-injected, sclera anicteric OROPHARYNX: MMM, Oropharyngeal congestion without exudates . NECK: supple, no JVD LYMPH:  no palpable lymphadenopathy in the cervical, axillary or inguinal regions LUNGS: clear to auscultation b/l with normal respiratory effort HEART: regular rate & rhythm ABDOMEN:   normoactive bowel sounds , non tender, not distended. noted to have a ventral hernia.  Extremity: no pedal edema PSYCH: alert & oriented x 3 with fluent speech NEURO: no focal motor/sensory deficits  LABORATORY DATA:  I have reviewed the data as listed  . CBC Latest Ref Rng & Units 07/25/2016 06/25/2016 06/11/2016  WBC 4.0 - 10.3 10e3/uL 6.4 9.4 9.5  Hemoglobin 13.0 - 17.1 g/dL 13.8 14.3 14.3  Hematocrit 38.4 - 49.9 % 40.2 41.9 41.1  Platelets 140 - 400 10e3/uL 153 207.0 174.0   . CBC    Component Value Date/Time   WBC 6.4 07/25/2016 0839  WBC 9.4 06/25/2016 1535   RBC 3.97 (L) 07/25/2016 0839   RBC 4.09 (L) 06/25/2016 1535   HGB 13.8 07/25/2016 0839   HCT 40.2 07/25/2016 0839   PLT 153 07/25/2016 0839   MCV 101.3 (H) 07/25/2016 0839   MCH 34.8 (H) 07/25/2016 0839   MCH 31.4 07/03/2010 0438   MCHC 34.3 07/25/2016 0839   MCHC 34.1 06/25/2016 1535   RDW 13.7 07/25/2016 0839   LYMPHSABS 1.1 07/25/2016 0839   MONOABS 0.8 07/25/2016 0839   EOSABS 0.9 (H) 07/25/2016 0839   BASOSABS 0.0 07/25/2016 0839    . CMP Latest Ref Rng & Units 07/25/2016 10/26/2015 10/24/2014  Glucose 70 - 140 mg/dl 90 78 91  BUN 7.0 - 26.0 mg/dL 19.2 24(H) 14  Creatinine 0.7 - 1.3 mg/dL 0.8 0.81 0.70  Sodium 136 - 145 mEq/L 136 132(L) 138  Potassium 3.5 - 5.1 mEq/L 4.6 4.5 4.5  Chloride 96 - 112 mEq/L - 97 101  CO2 22 - 29 mEq/L '25 29 29  ' Calcium 8.4 - 10.4 mg/dL 9.3 9.2 9.5  Total Protein 6.4 - 8.3 g/dL 6.9 6.4 6.5  Total Bilirubin 0.20 - 1.20 mg/dL 0.68 1.2 0.9  Alkaline Phos 40 - 150 U/L 68 62 52  AST 5 - 34 U/L '22 26 15  ' ALT 0 - 55 U/L '23 20 13     ' RADIOGRAPHIC STUDIES: I have personally reviewed the radiological images as listed and agreed with the findings in the report. No results found.  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 No associated leukocytosis, red blood cell or platelet disorder.  This  appears to likely be reactive and can be due to chronic use of Zafirlukast/Leukotriene inhibitors. It could also be related to other allergies Or hypersensitivity to other medications that are being used including NSAIDs.  Eosinophil counts today are 900 and within stable range since July 2013.  This is unlikely to represent a clonal eosinophilia/chronic eosinophilic leukemia/eosinophilia related to a lymphoproliferative state or myeloproliferative state.  No associated other leukocytosis.  The only system directed symptoms the patient has are pulmonary. Patient follows with Dr. Annamaria Boots for his pulmonary cares. Plan -I discussed with the patient and his lab results and their significance and the fact that his eosinophilia has been chronic and nonprogressive which is reassuring. -We shall send out a hypereosinophilic syndrome Fish panel with PDGFRA, PDGFRB, FGR1 , Chc2 deletion studies to rule out a clonal eosinophilia. -will check tryptase level. -No clinical evidence of myeloproliferative or lymphoproliferative disorder. -No overt indication for bone marrow biopsy at this time would need to consider this if he has progressive eosinophilia or other CBC abnormalities. -I would have pulmonology will be in to rule out concerns of an eosinophilic pneumonitis or eosinophilic granulomatosis with polyangiitis --since these could also be from Zafirlukast and might require consideration of weaning off this medication. -His primary care physician/pulmonologist could determine the risk vs benefits of continued leukotriene inhibitors.  #2 Macrocytosis without anemia - likely medication related Plan -check B12 and RBC folate with primary care physician and replace as needed. -The patient is developing more anemia might need consideration for ruling out MDS.  We shall call the patient with the results of lab testing from today. We shall follow him up in clinic on an as-needed basis and shall set him up for  follow-up if there is any concern for a clonal process on lab testing.  All of the patients questions were answered with apparent satisfaction. The patient knows to call the clinic with any problems, questions or concerns.  I spent 50 minutes counseling the patient face to face. The total time spent in the appointment was 60 minutes and more than 50% was on counseling and direct patient cares.    Sullivan Lone MD [place] AAHIVMS [facility] Hematology/Oncology Physician [facility]  (Office):       [phone] (Work cell):  [phone] (Fax):           [phone]  07/24/2016 3:17 PM

## 2016-07-24 NOTE — Telephone Encounter (Signed)
Appointments scheduled per 07/24/16 los. Patient was given a copy of the AVS report and appointment schedule per 07/24/16 los. °

## 2016-07-25 ENCOUNTER — Other Ambulatory Visit (HOSPITAL_BASED_OUTPATIENT_CLINIC_OR_DEPARTMENT_OTHER): Payer: BC Managed Care – PPO

## 2016-07-25 DIAGNOSIS — D721 Eosinophilia, unspecified: Secondary | ICD-10-CM

## 2016-07-25 LAB — CBC & DIFF AND RETIC
BASO%: 0.2 % (ref 0.0–2.0)
Basophils Absolute: 0 10*3/uL (ref 0.0–0.1)
EOS%: 14.8 % — AB (ref 0.0–7.0)
Eosinophils Absolute: 0.9 10*3/uL — ABNORMAL HIGH (ref 0.0–0.5)
HCT: 40.2 % (ref 38.4–49.9)
HGB: 13.8 g/dL (ref 13.0–17.1)
Immature Retic Fract: 8.1 % (ref 3.00–10.60)
LYMPH#: 1.1 10*3/uL (ref 0.9–3.3)
LYMPH%: 16.5 % (ref 14.0–49.0)
MCH: 34.8 pg — AB (ref 27.2–33.4)
MCHC: 34.3 g/dL (ref 32.0–36.0)
MCV: 101.3 fL — ABNORMAL HIGH (ref 79.3–98.0)
MONO#: 0.8 10*3/uL (ref 0.1–0.9)
MONO%: 12.6 % (ref 0.0–14.0)
NEUT#: 3.6 10*3/uL (ref 1.5–6.5)
NEUT%: 55.9 % (ref 39.0–75.0)
PLATELETS: 153 10*3/uL (ref 140–400)
RBC: 3.97 10*6/uL — AB (ref 4.20–5.82)
RDW: 13.7 % (ref 11.0–14.6)
RETIC %: 1.43 % (ref 0.80–1.80)
RETIC CT ABS: 56.77 10*3/uL (ref 34.80–93.90)
WBC: 6.4 10*3/uL (ref 4.0–10.3)

## 2016-07-25 LAB — COMPREHENSIVE METABOLIC PANEL
ALT: 23 U/L (ref 0–55)
ANION GAP: 10 meq/L (ref 3–11)
AST: 22 U/L (ref 5–34)
Albumin: 4.3 g/dL (ref 3.5–5.0)
Alkaline Phosphatase: 68 U/L (ref 40–150)
BUN: 19.2 mg/dL (ref 7.0–26.0)
CO2: 25 meq/L (ref 22–29)
CREATININE: 0.8 mg/dL (ref 0.7–1.3)
Calcium: 9.3 mg/dL (ref 8.4–10.4)
Chloride: 102 mEq/L (ref 98–109)
EGFR: >90 mL/min/{1.73_m2} (ref >90)
Glucose: 90 mg/dl (ref 70–140)
POTASSIUM: 4.6 meq/L (ref 3.5–5.1)
Sodium: 136 mEq/L (ref 136–145)
Total Bilirubin: 0.68 mg/dL (ref 0.20–1.20)
Total Protein: 6.9 g/dL (ref 6.4–8.3)

## 2016-07-25 LAB — CHCC SMEAR

## 2016-07-28 ENCOUNTER — Telehealth: Payer: Self-pay | Admitting: Internal Medicine

## 2016-07-28 LAB — ANCA TITERS
Atypical pANCA: <1:20 {titer}
Perinuclear (P-ANCA): <1:20 {titer}

## 2016-07-28 LAB — TRYPTASE: TRYPTASE: 2.9 ug/L (ref 2.2–13.2)

## 2016-07-28 NOTE — Telephone Encounter (Signed)
Per CY: I read Hematologist's OV note on patient-I would like to stop Accolate and see how he does.   I have spoken with patient and he understands request from CY-if patient notes the medication is needed again then he will call the office to seek guidance from [place]. Nothing more needed at this time.

## 2016-07-31 LAB — PDGFRA

## 2016-08-03 ENCOUNTER — Other Ambulatory Visit: Payer: Self-pay | Admitting: Internal Medicine

## 2016-08-04 LAB — MPN W/HYPEREOSINOPHILIA FISH

## 2016-08-04 NOTE — Telephone Encounter (Signed)
Pt's last fill was 07/16/16 #60. Please advise if ok to refill.

## 2016-08-13 ENCOUNTER — Other Ambulatory Visit: Payer: Self-pay | Admitting: Internal Medicine

## 2016-09-06 ENCOUNTER — Other Ambulatory Visit: Payer: Self-pay | Admitting: Internal Medicine

## 2016-09-24 ENCOUNTER — Encounter: Payer: Self-pay | Admitting: Internal Medicine

## 2016-09-24 ENCOUNTER — Other Ambulatory Visit: Payer: Self-pay | Admitting: Internal Medicine

## 2016-09-24 NOTE — Telephone Encounter (Signed)
refilled 6 mos due for eyarly end of sumemr

## 2016-10-10 ENCOUNTER — Telehealth: Payer: Self-pay

## 2016-10-10 NOTE — Telephone Encounter (Signed)
Returned pt call regarding f/u and questions for Dr. Irene Limbo. Pt told me that he is no longer taking accolate per his PCP;s recommendation. His younger brother also recently was diagnosed with polycythemia vera, which may be of interest to the physician. Pt wondering if there is a need to f/u with labs to check eosinophil count.   Staff message sent to Dr. Irene Limbo for f/u to question.

## 2016-10-16 ENCOUNTER — Telehealth: Payer: Self-pay | Admitting: *Deleted

## 2016-10-16 NOTE — Telephone Encounter (Signed)
SW pt, per Dr. Irene Limbo, no need for follow up with hem/onc at this time.  Pt given option to follow up with Dr. Irene Limbo in 4-6 months, or follow up with PCP and return if changes/concerns occur.  Pt stated he has an upcoming follow up with PCP, will repeat labs then and notify us for any changes.  Pt will notify in future if any changes/concerns as well.  Pt verbalized understanding of plan/thankful for call.

## 2016-10-24 ENCOUNTER — Other Ambulatory Visit: Payer: Self-pay | Admitting: Emergency Medicine

## 2016-10-24 DIAGNOSIS — I10 Essential (primary) hypertension: Secondary | ICD-10-CM

## 2016-10-31 ENCOUNTER — Other Ambulatory Visit: Payer: BC Managed Care – PPO

## 2016-11-01 ENCOUNTER — Other Ambulatory Visit: Payer: Self-pay | Admitting: Internal Medicine

## 2016-11-06 NOTE — Progress Notes (Signed)
Chief Complaint  Patient presents with  . Annual Exam    HPI: Patient  [name]  66 y.o. comes in today for [place] visit  And med management  HT  Controlled  Asthma  Stable    Eosinophilia  Off    Accolate . to be the cause of problem . To get fu  Urologist    Following       psa exerciseing  Still sees counselor  On lexapro 30 mg  Health Maintenance  Topic Date Due  . COLONOSCOPY  03/09/2016  . PNA vac Low Risk Adult (2 of 2 - PPSV23) 09/22/2016  . INFLUENZA VACCINE  10/29/2016  . TETANUS/TDAP  09/18/2019  . Hepatitis C Screening  Completed  . HIV Screening  Completed   Health Maintenance Review LIFESTYLE:  Exercise:  Still   Getting gym weight loss  Tracking  Tobacco/ETS: no Alcohol:   no Sugar beverages: no fit bit 5-6  Sleep: osa  Drug use: no HH of  1 no Work: 40  .      ROS:  GEN/ HEENT: No fever, significant weight changes sweats headaches vision problems hearing changes, CV/ PULM; No chest pain shortness of breath cough, syncope,edema  change in exercise tolerance. GI /GU: No adominal pain, vomiting, change in bowel habits. No blood in the stool. No significant GU symptoms. SKIN/HEME: ,no acute skin rashes suspicious lesions or bleeding. No lymphadenopathy, nodules, masses.  NEURO/ PSYCH:  No neurologic signs such as weakness numbness. No depression anxiety. IMM/ Allergy: No unusual infections.  Allergy .   REST of 12 system review negative except as per HPI   Past Medical History:  Diagnosis Date  . Arthritis    knees  . Asthma   . Depression   . Elevated PSA    [place] and bx negative 2011  . Hypertension    nl cardiolite in 1998  . Pneumonia 2005   right hospitalized   . Rhinosinusitis   . Sleep apnea    wears CPAP    Past Surgical History:  Procedure Laterality Date  . FACIAL RECONSTRUCTION SURGERY    . NASAL SINUS SURGERY    . 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 shoulder  atrhroscopic surgery     Right  . VENTRAL HERNIA REPAIR  03-2007   rerepair winter 2010    Family History  Problem Relation Age of Onset  . Heart failure Father        MV replacement  . Asthma Mother   . Asthma Unknown   . Hypertension Unknown   . Diabetes Maternal Grandmother   . Healthy Sister   . Healthy Brother   . Healthy Brother   . Colon cancer Neg Hx   . Pancreatic cancer Neg Hx   . Stomach cancer Neg Hx   . Esophageal cancer Neg Hx     Social History   Social History  . Marital status: Single    Spouse name: N/A  . Number of children: N/A  . Years of education: N/A   Occupational History  . 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 Smoking status: Never Smoker  . Smokeless tobacco: Never Used  . Alcohol use No     Comment: Quit drinking ETOH 1986  . Drug use: No  . Sexual activity: Not Asked   Other Topics Concern  . 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 ADVAIR DISKUS 100-50 MCG/DOSE AEPB INHALE 1 PUFF INTO THE LUNGS 2 (TWO) TIMES DAILY. 60 each 6  . amLODipine-benazepril (LOTREL) 5-10 MG capsule TAKE 1 CAPSULE BY MOUTH EVERY DAY 90 capsule 1  . ammonium lactate (AMLACTIN) 12 % cream Use as directed as needed     . azelastine (ASTELIN) 137 MCG/SPRAY nasal spray USE AS DIRECTED 30 mL 5  . COMBIVENT RESPIMAT 20-100 MCG/ACT AERS respimat INHALE 2 PUFFS UP TO FOUR TIMES A DAY AS NEEDED 4 g 3  . escitalopram (LEXAPRO) 20 MG tablet TAKE ONE AND ONE-HALF TABLET DAILY 135 tablet 1  . fexofenadine (ALLEGRA) 180 MG tablet Take 180 mg by mouth daily.      Marland Kitchen ibuprofen (ADVIL,MOTRIN) 800 MG tablet TAKE ONE TABLET BY MOUTH EVERY 8 HOURS AS NEEDED 60 tablet 1  . mometasone (NASONEX) 50 MCG/ACT nasal spray USE 1 TO 2 SPRAYS IN EACH NOSTRIL DAILY 17 g 3  .  protriptyline (VIVACTIL) 10 MG tablet TAKE 1 TABLET (10 MG TOTAL) BY MOUTH AT BEDTIME. 30 tablet 11  . silodosin (RAPAFLO) 8 MG CAPS capsule Take 1 capsule (8 mg total) by mouth at bedtime. 30 capsule 11  . zafirlukast (ACCOLATE) 20 MG tablet TAKE 1 TABLET TWICE DAILY 60 tablet 0  . aspirin 325 MG EC tablet Take 325 mg by mouth daily.       No facility-administered medications prior to visit.      EXAM:  BP 130/80 (BP Location: Right Arm, Patient Position: Sitting, Cuff Size: Normal)   Pulse 71   Temp 98 F (36.7 C) (Oral)   Ht '5\' 3"'$  (1.6 m)   Wt 205 lb 9.6 oz (93.3 kg)   BMI 36.42 kg/m   Body mass index is 36.42 kg/m. Wt Readings from Last 3 Encounters:  11/07/16 205 lb 9.6 oz (93.3 kg)  07/24/16 201 lb 1.6 oz (91.2 kg)  06/11/16 198 lb 12.8 oz (90.2 kg)    Physical Exam: Vital signs reviewed VOH:YWVP is a well-developed well-nourished alert cooperative    who appearsr stated age in no acute distress.  HEENT: normocephalic atraumatic , Eyes: PERRL EOM's full, conjunctiva clear, Nares: paten,t no  discharge or tenderness., Ears: no deformity EAC's clear TMs with normal landmarks. Mouth: clear OP, no lesions, edema.  Moist mucous membranes. Dentition in adequate repair. NECK: supple without masses, thyromegaly or bruits. CHEST/PULM:  Clear to auscultation and percussion breath sounds equal no wheeze , rales or rhonchi. No chest wall deformities or tenderness. Breast: normal by inspection . No dimpling, discharge, masses, tenderness or discharge . CV: PMI is nondisplaced, S1 S2 no gallops, murmurs, rubs. Peripheral pulses are full without delay.  ABDOMEN: Bowel sounds normal nontender  No guard or rebound, no hepato splenomegal no CVA tenderness.  No hernia.ventral diastasis  clubbing cyanosis or edema, no acute joint swelling or redness no focal atrophy   NEURO:  Oriented x3, cranial nerves 3-12 appear to be intact, no obvious focal weakness,gait within normal limits no abnormal  reflexes or asymmetrical SKIN: No acute rashes normal turgor, color, no bruising or petechiae. right lateral trunk round verrucous  With red base   Left hip area with large red area with peripheral scaling   PSYCH:  Oriented, good eye contact, no obvious depression anxiety, cognition and judgment appear normal. LN: no cervical axillary inguinal adenopathy  Lab Results  Component Value Date   WBC 8.6 11/07/2016   HGB 13.7 11/07/2016   HCT 40.2 11/07/2016   PLT 177.0 11/07/2016   GLUCOSE 85 11/07/2016   CHOL 152 11/07/2016   TRIG 55.0 11/07/2016   HDL 44.40 11/07/2016   LDLCALC 97 11/07/2016   ALT 20 11/07/2016   AST 24 11/07/2016   NA 135 11/07/2016   K 4.6 11/07/2016   CL 100 11/07/2016   CREATININE 0.76 11/07/2016   BUN 13 11/07/2016   CO2 29 11/07/2016   TSH 3.43 11/07/2016   PSA 7.83 (H) 10/26/2015   INR 0.96 06/20/2010    BP Readings from Last 3 Encounters:  11/07/16 130/80  07/24/16 136/78  06/11/16 120/72    Lab results reviewed with patient   ASSESSMENT AND PLAN:  Discussed the following assessment and plan:  Visit for preventive health examination - Plan: Basic metabolic panel, CBC with Differential/Platelet, Hepatic function panel, Lipid panel, TSH, Vitamin B12, Folate RBC  Medication management - Plan: Basic metabolic panel, CBC with Differential/Platelet, Hepatic function panel, Lipid panel, TSH, Vitamin B12, Folate RBC  Essential hypertension - Plan: Basic metabolic panel, CBC with Differential/Platelet, Hepatic function panel, Lipid panel, TSH, Vitamin B12, Folate RBC  Eosinophilia - Plan: Basic metabolic panel, CBC with Differential/Platelet, Hepatic function panel, Lipid panel, TSH, Vitamin B12, Folate RBC  Asthma, chronic, unspecified asthma severity, uncomplicated - Plan: Basic metabolic panel, CBC with Differential/Platelet, Hepatic function panel, Lipid panel, TSH, Vitamin B12, Folate RBC  Macrocytosis without anemia - Plan: Basic metabolic panel,  CBC with Differential/Platelet, Hepatic function panel, Lipid panel, TSH, Vitamin B12, Folate RBC  Skin lesion - ? wart other .   patch ? if tinea  add antifungal see derm   Need for shingles vaccine - Plan: Varicella-zoster vaccine IM (Shingrix)  Need for prophylactic vaccination against Streptococcus pneumoniae (pneumococcus) - Plan: Pneumococcal polysaccharide vaccine 23-valent greater than or equal to 2yo subcutaneous/IM reviewed  Above  See derm for skin area and can use antifungal  On hip area.  Labs today  consider dec  lexapro to  20 mg per days tolerated  Patient Care Team: Burnis Medin, MD as PCP - General Terance Hart Lucina Mellow, MD (Inactive) (Urology) orthopedic Festus Aloe, MD as Attending Physician (Urology) Deneise Lever, MD as Attending Physician (Pulmonary Disease) Patient Instructions  Glad you are doing well .  Will notify you  of labs when available.  See dermatology   As planned  Can try antifungal on left hip area .  Get second shingrix in 2-6 months .  Keep exercise and dietary  Attention.      Preventive Care 67 Years and Older, Male Preventive care refers to lifestyle choices and visits with your health care provider that can promote health and wellness. What does preventive care include?  A yearly physical exam. This is also called an annual well check.  Dental exams once or twice a year.  Routine eye exams. Ask your health care provider how often you should have your eyes checked.  Personal lifestyle choices, including: ? Daily care of your teeth and gums. ? Regular physical activity. ? Eating a healthy diet. ? Avoiding tobacco and drug use. ? Limiting alcohol use. ? Practicing safe sex. ? Taking low doses of aspirin every day. ? Taking vitamin and mineral supplements as recommended by your health care provider. What  happens during an annual well check? The services and screenings done by your health care provider during your annual well  check will depend on your age, overall health, lifestyle risk factors, and family history of disease. Counseling Your health care provider may ask you questions about your:  Alcohol use.  Tobacco use.  Drug use.  Emotional well-being.  Home and relationship well-being.  Sexual activity.  Eating habits.  History of falls.  Memory and ability to understand (cognition).  Work and work Statistician.  Screening You may have the following tests or measurements:  Height, weight, and BMI.  Blood pressure.  Lipid and cholesterol levels. These may be checked every 5 years, or more frequently if you are over 47 years old.  Skin check.  Lung cancer screening. You may have this screening every year starting at age 27 if you have a 30-pack-year history of smoking and currently smoke or have quit within the past 15 years.  Fecal occult blood test (FOBT) of the stool. You may have this test every year starting at age 78.  Flexible sigmoidoscopy or colonoscopy. You may have a sigmoidoscopy every 5 years or a colonoscopy every 10 years starting at age 52.  Prostate cancer screening. Recommendations will vary depending on your family history and other risks.  Hepatitis C blood test.  Hepatitis B blood test.  Sexually transmitted disease (STD) testing.  Diabetes screening. This is done by checking your blood sugar (glucose) after you have not eaten for a while (fasting). You may have this done every 1-3 years.  Abdominal aortic aneurysm (AAA) screening. You may need this if you are a current or former smoker.  Osteoporosis. You may be screened starting at age 56 if you are at high risk.  Talk with your health care provider about your test results, treatment options, and if necessary, the need for more tests. Vaccines Your health care provider may recommend certain vaccines, such as:  Influenza vaccine. This is recommended every year.  Tetanus, diphtheria, and acellular  pertussis (Tdap, Td) vaccine. You may need a Td booster every 10 years.  Varicella vaccine. You may need this if you have not been vaccinated.  Zoster vaccine. You may need this after age 30.  Measles, mumps, and rubella (MMR) vaccine. You may need at least one dose of MMR if you were born in 1957 or later. You may also need a second dose.  Pneumococcal 13-valent conjugate (PCV13) vaccine. One dose is recommended after age 28.  Pneumococcal polysaccharide (PPSV23) vaccine. One dose is recommended after age 77.  Meningococcal vaccine. You may need this if you have certain conditions.  Hepatitis A vaccine. You may need this if you have certain conditions or if you travel or work in places where you may be exposed to hepatitis A.  Hepatitis B vaccine. You may need this if you have certain conditions or if you travel or work in places where you may be exposed to hepatitis B.  Haemophilus influenzae type b (Hib) vaccine. You may need this if you have certain risk factors.  Talk to your health care provider about which screenings and vaccines you need and how often you need them. This information is not intended to replace advice given to you by your health care provider. Make sure you discuss any questions you have with your health care provider. Document Released: 04/13/2015 Document Revised: 12/05/2015 Document Reviewed: 01/16/2015 Elsevier Interactive Patient Education  2017 [place]  KRegis Bill M.D.

## 2016-11-07 ENCOUNTER — Ambulatory Visit (INDEPENDENT_AMBULATORY_CARE_PROVIDER_SITE_OTHER): Payer: BC Managed Care – PPO | Admitting: Internal Medicine

## 2016-11-07 ENCOUNTER — Encounter: Payer: Self-pay | Admitting: Internal Medicine

## 2016-11-07 VITALS — BP 130/80 | HR 71 | Temp 98.0°F | Ht 63.0 in | Wt 205.6 lb

## 2016-11-07 DIAGNOSIS — J45909 Unspecified asthma, uncomplicated: Secondary | ICD-10-CM | POA: Diagnosis not present

## 2016-11-07 DIAGNOSIS — Z79899 Other long term (current) drug therapy: Secondary | ICD-10-CM

## 2016-11-07 DIAGNOSIS — L989 Disorder of the skin and subcutaneous tissue, unspecified: Secondary | ICD-10-CM

## 2016-11-07 DIAGNOSIS — Z0000 Encounter for general adult medical examination without abnormal findings: Secondary | ICD-10-CM

## 2016-11-07 DIAGNOSIS — D721 Eosinophilia, unspecified: Secondary | ICD-10-CM

## 2016-11-07 DIAGNOSIS — Z23 Encounter for immunization: Secondary | ICD-10-CM | POA: Diagnosis not present

## 2016-11-07 DIAGNOSIS — I10 Essential (primary) hypertension: Secondary | ICD-10-CM | POA: Diagnosis not present

## 2016-11-07 DIAGNOSIS — D7589 Other specified diseases of blood and blood-forming organs: Secondary | ICD-10-CM

## 2016-11-07 LAB — CBC WITH DIFFERENTIAL/PLATELET
BASOS ABS: 0 10*3/uL (ref 0.0–0.1)
Basophils Relative: 0.5 % (ref 0.0–3.0)
EOS PCT: 11.3 % — AB (ref 0.0–5.0)
Eosinophils Absolute: 1 10*3/uL — ABNORMAL HIGH (ref 0.0–0.7)
HCT: 40.2 % (ref 39.0–52.0)
HEMOGLOBIN: 13.7 g/dL (ref 13.0–17.0)
LYMPHS PCT: 11.6 % — AB (ref 12.0–46.0)
Lymphs Abs: 1 10*3/uL (ref 0.7–4.0)
MCHC: 33.9 g/dL (ref 30.0–36.0)
MCV: 105.3 fl — AB (ref 78.0–100.0)
MONOS PCT: 11.1 % (ref 3.0–12.0)
Monocytes Absolute: 1 10*3/uL (ref 0.1–1.0)
NEUTROS PCT: 65.5 % (ref 43.0–77.0)
Neutro Abs: 5.7 10*3/uL (ref 1.4–7.7)
PLATELETS: 177 10*3/uL (ref 150.0–400.0)
RBC: 3.82 Mil/uL — AB (ref 4.22–5.81)
RDW: 14.9 % (ref 11.5–15.5)
WBC: 8.6 10*3/uL (ref 4.0–10.5)

## 2016-11-07 LAB — HEPATIC FUNCTION PANEL
ALT: 20 U/L (ref 0–53)
AST: 24 U/L (ref 0–37)
Albumin: 4.6 g/dL (ref 3.5–5.2)
Alkaline Phosphatase: 65 U/L (ref 39–117)
Bilirubin, Direct: 0.1 mg/dL (ref 0.0–0.3)
TOTAL PROTEIN: 6.5 g/dL (ref 6.0–8.3)
Total Bilirubin: 0.8 mg/dL (ref 0.2–1.2)

## 2016-11-07 LAB — VITAMIN B12: VITAMIN B 12: 361 pg/mL (ref 211–911)

## 2016-11-07 LAB — LIPID PANEL
CHOLESTEROL: 152 mg/dL (ref 0–200)
HDL: 44.4 mg/dL (ref >39.00)
LDL Cholesterol: 97 mg/dL (ref 0–99)
NONHDL: 107.52
TRIGLYCERIDES: 55 mg/dL (ref 0.0–149.0)
Total CHOL/HDL Ratio: 3
VLDL: 11 mg/dL (ref 0.0–40.0)

## 2016-11-07 LAB — BASIC METABOLIC PANEL
BUN: 13 mg/dL (ref 6–23)
CALCIUM: 9 mg/dL (ref 8.4–10.5)
CO2: 29 mEq/L (ref 19–32)
Chloride: 100 mEq/L (ref 96–112)
Creatinine, Ser: 0.76 mg/dL (ref 0.40–1.50)
GFR: 109.36 mL/min (ref >60.00)
GLUCOSE: 85 mg/dL (ref 70–99)
Potassium: 4.6 mEq/L (ref 3.5–5.1)
SODIUM: 135 meq/L (ref 135–145)

## 2016-11-07 LAB — TSH: TSH: 3.43 u[IU]/mL (ref 0.35–4.50)

## 2016-11-07 NOTE — Patient Instructions (Addendum)
Glad you are doing well .  Will notify you  of labs when available.  See dermatology   As planned  Can try antifungal on left hip area .  Get second shingrix in 2-6 months .  Keep exercise and dietary  Attention.      Preventive Care 32 Years and Older, Male Preventive care refers to lifestyle choices and visits with your health care provider that can promote health and wellness. What does preventive care include?  A yearly physical exam. This is also called an annual well check.  Dental exams once or twice a year.  Routine eye exams. Ask your health care provider how often you should have your eyes checked.  Personal lifestyle choices, including: ? Daily care of your teeth and gums. ? Regular physical activity. ? Eating a healthy diet. ? Avoiding tobacco and drug use. ? Limiting alcohol use. ? Practicing safe sex. ? Taking low doses of aspirin every day. ? Taking vitamin and mineral supplements as recommended by your health care provider. What happens during an annual well check? The services and screenings done by your health care provider during your annual well check will depend on your age, overall health, lifestyle risk factors, and family history of disease. Counseling Your health care provider may ask you questions about your:  Alcohol use.  Tobacco use.  Drug use.  Emotional well-being.  Home and relationship well-being.  Sexual activity.  Eating habits.  History of falls.  Memory and ability to understand (cognition).  Work and work Statistician.  Screening You may have the following tests or measurements:  Height, weight, and BMI.  Blood pressure.  Lipid and cholesterol levels. These may be checked every 5 years, or more frequently if you are over 82 years old.  Skin check.  Lung cancer screening. You may have this screening every year starting at age 65 if you have a 30-pack-year history of smoking and currently smoke or have quit within the  past 15 years.  Fecal occult blood test (FOBT) of the stool. You may have this test every year starting at age 65.  Flexible sigmoidoscopy or colonoscopy. You may have a sigmoidoscopy every 5 years or a colonoscopy every 10 years starting at age 65.  Prostate cancer screening. Recommendations will vary depending on your family history and other risks.  Hepatitis C blood test.  Hepatitis B blood test.  65 Sexually transmitted disease (STD) testing.  Diabetes screening. This is done by checking your blood sugar (glucose) after you have not eaten for a while (fasting). You may have this done every 1-3 years.  Abdominal aortic aneurysm (AAA) screening. You may need this if you are a current or former smoker.  Osteoporosis. You may be screened starting at age 65 if you are at high risk.  Talk with your health care provider about your test results, treatment options, and if necessary, the need for more tests. Vaccines Your health care provider may recommend certain vaccines, such as:  Influenza vaccine. This is recommended every year.  Tetanus, diphtheria, and acellular pertussis (Tdap, Td) vaccine. You may need a Td booster every 10 years.  Varicella vaccine. You may need this if you have not been vaccinated.  Zoster vaccine. You may need this after age 65.  Measles, mumps, and rubella (MMR) vaccine. You may need at least one dose of MMR if you were born in 1957 or later. You may also need a second dose.  Pneumococcal 13-valent conjugate (PCV13) vaccine. One dose is  recommended after age 65.  Pneumococcal polysaccharide (PPSV23) vaccine. One dose is recommended after age 65.  Meningococcal vaccine. You may need this if you have certain conditions.  Hepatitis A vaccine. You may need this if you have certain conditions or if you travel or work in places where you may be exposed to hepatitis A.  Hepatitis B vaccine. You may need this if you have certain conditions or if you travel or  work in places where you may be exposed to hepatitis B.  Haemophilus influenzae type b (Hib) vaccine. You may need this if you have certain risk factors.  Talk to your health care provider about which screenings and vaccines you need and how often you need them. This information is not intended to replace advice given to you by your health care provider. Make sure you discuss any questions you have with your health care provider. Document Released: 04/13/2015 Document Revised: 12/05/2015 Document Reviewed: 01/16/2015 Elsevier Interactive Patient Education  2017 [employer].

## 2016-11-09 LAB — FOLATE RBC: RBC FOLATE: 744 ng/mL (ref >280)

## 2016-11-11 ENCOUNTER — Other Ambulatory Visit: Payer: Self-pay | Admitting: Urology

## 2016-11-11 DIAGNOSIS — R972 Elevated prostate specific antigen [PSA]: Secondary | ICD-10-CM

## 2016-11-14 ENCOUNTER — Other Ambulatory Visit: Payer: Self-pay | Admitting: Emergency Medicine

## 2016-11-14 DIAGNOSIS — I10 Essential (primary) hypertension: Secondary | ICD-10-CM

## 2016-11-19 ENCOUNTER — Ambulatory Visit (HOSPITAL_COMMUNITY)
Admission: RE | Admit: 2016-11-19 | Discharge: 2016-11-19 | Disposition: A | Discharge status: Home / Self Care | Payer: BC Managed Care – PPO | Source: Ambulatory Visit | Attending: Urology | Admitting: Urology

## 2016-11-19 DIAGNOSIS — R972 Elevated prostate specific antigen [PSA]: Secondary | ICD-10-CM

## 2016-11-19 DIAGNOSIS — N400 Enlarged prostate without lower urinary tract symptoms: Secondary | ICD-10-CM | POA: Insufficient documentation

## 2016-11-19 MED ORDER — GADOBENATE DIMEGLUMINE 529 MG/ML IV SOLN
20.0000 mL | Freq: Once | INTRAVENOUS | Status: AC | PRN
  Administered 2016-11-19: 19 mL via INTRAVENOUS

## 2016-12-08 ENCOUNTER — Other Ambulatory Visit: Payer: Self-pay | Admitting: Internal Medicine

## 2016-12-10 NOTE — Telephone Encounter (Signed)
Dr Regis Bill please advise if okay to refill or defer to Dr Annamaria Boots @ Pulmonology. Thanks.

## 2016-12-10 NOTE — Telephone Encounter (Signed)
Ok to refill x 6 however  Will send copy to dr Annamaria Boots to make sure he agrees

## 2016-12-11 NOTE — Telephone Encounter (Signed)
Last time I saw him his breathing issues were doing very well. Fine with me for you to handle the refills.

## 2016-12-12 MED ORDER — FLUTICASONE-SALMETEROL 100-50 MCG/DOSE IN AEPB: INHALATION_SPRAY | RESPIRATORY_TRACT | 6 refills | Status: DC

## 2016-12-12 NOTE — Telephone Encounter (Signed)
Ok to refill x 6  Sent in

## 2016-12-19 ENCOUNTER — Encounter: Payer: Self-pay | Admitting: Internal Medicine

## 2017-01-07 ENCOUNTER — Other Ambulatory Visit: Payer: Self-pay | Admitting: Internal Medicine

## 2017-02-09 ENCOUNTER — Encounter: Payer: Self-pay | Admitting: Internal Medicine

## 2017-02-09 ENCOUNTER — Ambulatory Visit (INDEPENDENT_AMBULATORY_CARE_PROVIDER_SITE_OTHER): Payer: BC Managed Care – PPO | Admitting: Internal Medicine

## 2017-02-09 VITALS — BP 122/70 | HR 81 | Temp 98.0°F | Wt 214.8 lb

## 2017-02-09 DIAGNOSIS — J45901 Unspecified asthma with (acute) exacerbation: Secondary | ICD-10-CM

## 2017-02-09 DIAGNOSIS — Z79899 Other long term (current) drug therapy: Secondary | ICD-10-CM | POA: Diagnosis not present

## 2017-02-09 DIAGNOSIS — J45909 Unspecified asthma, uncomplicated: Secondary | ICD-10-CM | POA: Diagnosis not present

## 2017-02-09 MED ORDER — DOXYCYCLINE HYCLATE 100 MG PO TABS: 100.0000 mg | ORAL_TABLET | Freq: Two times a day (BID) | ORAL | 0 refills | Status: DC

## 2017-02-09 MED ORDER — PREDNISONE 20 MG PO TABS
ORAL_TABLET | ORAL | 0 refills | Status: DC
Start: 2017-02-09 — End: 2017-06-11

## 2017-02-09 MED ORDER — IPRATROPIUM-ALBUTEROL 0.5-2.5 (3) MG/3ML IN SOLN
3.0000 mL | Freq: Once | RESPIRATORY_TRACT | Status: AC
  Administered 2017-02-09: 3 mL via RESPIRATORY_TRACT

## 2017-02-09 NOTE — Progress Notes (Signed)
Chief Complaint  Patient presents with  . Cough    x 1 week with congestion with mucus production; thick clear to yellow in color and wheezing. Pt feels related to weather change and allergies. Some SOB and chest tightness. Using Mucinex, little noticed relief.     HPI: [name] 66 y.o.   sda    osnet about a week or so and  Using inhaler  Poss semiannual allergy  Flare  With rain and weather change  And leaves   Added mucinex   .   Inhaler  several times .   A day .  And still some wheezing    ROS: See pertinent positives and negatives per HPI. No fever chills  New sob    To have right  shoulder replacement .  Dec 4th .    Dr Onnie Graham    Past Medical History:  Diagnosis Date  . Arthritis    knees  . Asthma   . Depression   . Elevated PSA    [place] and bx negative 2011  . Hypertension    nl cardiolite in 1998  . Pneumonia 2005   right hospitalized   . Rhinosinusitis   . Sleep apnea    wears CPAP    Family History  Problem Relation Age of Onset  . Heart failure Father        MV replacement  . Asthma Mother   . Asthma Unknown   . Hypertension Unknown   . Diabetes Maternal Grandmother   . Healthy Sister   . Healthy Brother   . Healthy Brother   . Colon cancer Neg Hx   . Pancreatic cancer Neg Hx   . Stomach cancer Neg Hx   . Esophageal cancer Neg Hx     Social History   Socioeconomic History  . Marital status: Single    Spouse name: None  . Number of children: None  . Years of education: None  . Highest education level: None  Social Needs  . Financial resource strain: None  . Food insecurity - worry: None  . Food insecurity - inability: None  . Transportation needs - medical: None  . Transportation needs - non-medical: None  Occupational History  . 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 Smoking status: Never Smoker  . Smokeless tobacco: Never Used  Substance and Sexual Activity  . Alcohol use: No    Comment:  Quit drinking ETOH 1986  . Drug use: No  . Sexual activity: None  Other Topics Concern  . 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 amLODipine-benazepril (LOTREL) 5-10 MG capsule TAKE 1 CAPSULE BY MOUTH EVERY DAY 90 capsule 1  . ammonium lactate (AMLACTIN) 12 % cream Use as directed as needed     . azelastine (ASTELIN) 0.1 % nasal spray USE AS DIRECTED    . azelastine (ASTELIN) 137 MCG/SPRAY nasal spray USE AS DIRECTED 30 mL 5  . COMBIVENT RESPIMAT 20-100 MCG/ACT AERS respimat INHALE 2 PUFFS UP TO FOUR TIMES A DAY AS NEEDED 1 Inhaler 3  . escitalopram (LEXAPRO) 20 MG tablet TAKE ONE AND ONE-HALF TABLET DAILY 135 tablet 1  . fexofenadine (ALLEGRA) 180 MG tablet Take 180 mg  by mouth daily.      . Fluticasone-Salmeterol (ADVAIR DISKUS) 100-50 MCG/DOSE AEPB INHALE 1 PUFF INTO THE LUNGS 2 (TWO) TIMES DAILY. 60 each 6  . ibuprofen (ADVIL,MOTRIN) 800 MG tablet TAKE ONE TABLET BY MOUTH EVERY 8 HOURS AS NEEDED 60 tablet 1  . mometasone (NASONEX) 50 MCG/ACT nasal spray USE 1 TO 2 SPRAYS IN EACH NOSTRIL DAILY 17 g 3  . protriptyline (VIVACTIL) 10 MG tablet TAKE 1 TABLET (10 MG TOTAL) BY MOUTH AT BEDTIME. 30 tablet 11  . silodosin (RAPAFLO) 8 MG CAPS capsule Take 1 capsule (8 mg total) by mouth at bedtime. 30 capsule 11  . aspirin 325 MG EC tablet Take 325 mg by mouth daily.       No facility-administered medications prior to visit.      EXAM:  BP 122/70 (BP Location: Left Arm, Patient Position: Sitting, Cuff Size: Normal)   Pulse 81   Temp 98 F (36.7 C) (Oral)   Wt 214 lb 12.8 oz (97.4 kg)   SpO2 96%   BMI 38.05 kg/m   Body mass index is 38.05 kg/m.  GENERAL: vitals reviewed and listed above, alert, oriented, appears well hydrated and in no acute distress congdested  Non toxic  HEENT: atraumatic, conjunctiva   clear, no obvious abnormalities on inspection of external nose and ears OP : no lesion edema or exudate  NECK: no obvious masses on inspection palpation  LUNGS: clear to auscultation bilaterally,    Decrease   bs and exp wheezes  norales CV: HRRR, no clubbing cyanosis or  peripheral edema nl cap refill  MS: moves all extremities without noticeable focal  abnormality PSYCH: pleasant and cooperative, no obvious depression or anxiety  ASSESSMENT AND PLAN:  Discussed the following assessment and plan:  Exacerbation of asthma, unspecified asthma severity, unspecified whether persistent - Plan: ipratropium-albuterol (DUONEB) 0.5-2.5 (3) MG/3ML nebulizer solution 3 mL  Medication management  Asthma, chronic, unspecified asthma severity, uncomplicated - Plan: ipratropium-albuterol (DUONEB) 0.5-2.5 (3) MG/3ML nebulizer solution 3 mL Seems like typical asthma flare for him.  responmse to duoneb  Steroid burst can add antibiotic if appropriate. To have shoulder replacement surgery December 4. -Patient advised to return or notify health care team  if symptoms worsen ,persist or new concerns arise.  Patient Instructions  Asthma flare  Take prednisone   If  Infected  Appearing phelgm can add antibiotic also . Fu with [place] or  Pulmonary if not getting better        Standley Brooking. Panosh M.D.

## 2017-02-09 NOTE — Patient Instructions (Addendum)
Asthma flare  Take prednisone   If  Infected  Appearing phelgm can add antibiotic also . Fu with [place] or  Pulmonary if not getting better

## 2017-02-18 ENCOUNTER — Ambulatory Visit (INDEPENDENT_AMBULATORY_CARE_PROVIDER_SITE_OTHER): Payer: BC Managed Care – PPO

## 2017-02-18 DIAGNOSIS — Z23 Encounter for immunization: Secondary | ICD-10-CM | POA: Diagnosis not present

## 2017-03-11 ENCOUNTER — Other Ambulatory Visit: Payer: BC Managed Care – PPO

## 2017-03-29 ENCOUNTER — Other Ambulatory Visit: Payer: Self-pay | Admitting: Internal Medicine

## 2017-04-01 ENCOUNTER — Other Ambulatory Visit: Payer: Self-pay | Admitting: Internal Medicine

## 2017-04-01 NOTE — Telephone Encounter (Signed)
CY Please advise on refill. Thanks.  

## 2017-04-01 NOTE — Telephone Encounter (Signed)
Ok to refill x 12 months 

## 2017-04-03 ENCOUNTER — Other Ambulatory Visit (INDEPENDENT_AMBULATORY_CARE_PROVIDER_SITE_OTHER): Payer: BC Managed Care – PPO

## 2017-04-03 DIAGNOSIS — I10 Essential (primary) hypertension: Secondary | ICD-10-CM

## 2017-04-03 LAB — CBC WITH DIFFERENTIAL/PLATELET
BASOS PCT: 0.7 % (ref 0.0–3.0)
Basophils Absolute: 0.1 10*3/uL (ref 0.0–0.1)
Eosinophils Absolute: 1.6 10*3/uL — ABNORMAL HIGH (ref 0.0–0.7)
HEMATOCRIT: 40.1 % (ref 39.0–52.0)
HEMOGLOBIN: 13.7 g/dL (ref 13.0–17.0)
Lymphocytes Relative: 14.4 % (ref 12.0–46.0)
Lymphs Abs: 1.1 10*3/uL (ref 0.7–4.0)
MCHC: 34.2 g/dL (ref 30.0–36.0)
MCV: 103.1 fl — AB (ref 78.0–100.0)
MONOS PCT: 12.3 % — AB (ref 3.0–12.0)
Monocytes Absolute: 1 10*3/uL (ref 0.1–1.0)
Neutro Abs: 4 10*3/uL (ref 1.4–7.7)
Neutrophils Relative %: 52.4 % (ref 43.0–77.0)
Platelets: 196 10*3/uL (ref 150.0–400.0)
RBC: 3.89 Mil/uL — AB (ref 4.22–5.81)
RDW: 15.1 % (ref 11.5–15.5)
WBC: 7.7 10*3/uL (ref 4.0–10.5)

## 2017-04-06 ENCOUNTER — Encounter: Payer: Self-pay | Admitting: Internal Medicine

## 2017-04-06 NOTE — Telephone Encounter (Signed)
Please advise Dr Regis Bill on recent lab results and patient concerns of elevated numbers, thanks.

## 2017-05-31 ENCOUNTER — Other Ambulatory Visit: Payer: Self-pay | Admitting: Internal Medicine

## 2017-06-10 ENCOUNTER — Encounter: Payer: Self-pay | Admitting: Internal Medicine

## 2017-06-11 ENCOUNTER — Ambulatory Visit: Payer: BC Managed Care – PPO | Admitting: Internal Medicine

## 2017-06-11 ENCOUNTER — Encounter: Payer: Self-pay | Admitting: Internal Medicine

## 2017-06-11 ENCOUNTER — Other Ambulatory Visit (INDEPENDENT_AMBULATORY_CARE_PROVIDER_SITE_OTHER): Payer: BC Managed Care – PPO

## 2017-06-11 ENCOUNTER — Ambulatory Visit (INDEPENDENT_AMBULATORY_CARE_PROVIDER_SITE_OTHER)
Admission: RE | Admit: 2017-06-11 | Discharge: 2017-06-11 | Disposition: A | Discharge status: Home / Self Care | Payer: BC Managed Care – PPO | Source: Ambulatory Visit | Attending: Internal Medicine | Admitting: Internal Medicine

## 2017-06-11 VITALS — BP 126/78 | HR 82 | Ht 65.0 in | Wt 205.8 lb

## 2017-06-11 DIAGNOSIS — J4540 Moderate persistent asthma, uncomplicated: Secondary | ICD-10-CM

## 2017-06-11 DIAGNOSIS — J4520 Mild intermittent asthma, uncomplicated: Secondary | ICD-10-CM

## 2017-06-11 DIAGNOSIS — G4733 Obstructive sleep apnea (adult) (pediatric): Secondary | ICD-10-CM | POA: Diagnosis not present

## 2017-06-11 LAB — CBC WITH DIFFERENTIAL/PLATELET
Basophils Absolute: 0.1 K/uL (ref 0.0–0.1)
Basophils Relative: 0.8 % (ref 0.0–3.0)
Eosinophils Absolute: 1 K/uL — ABNORMAL HIGH (ref 0.0–0.7)
Eosinophils Relative: 12 % — ABNORMAL HIGH (ref 0.0–5.0)
HCT: 44.3 % (ref 39.0–52.0)
Hemoglobin: 15.4 g/dL (ref 13.0–17.0)
Lymphocytes Relative: 14.1 % (ref 12.0–46.0)
Lymphs Abs: 1.2 K/uL (ref 0.7–4.0)
MCHC: 34.8 g/dL (ref 30.0–36.0)
MCV: 99.2 fl (ref 78.0–100.0)
Monocytes Absolute: 0.9 K/uL (ref 0.1–1.0)
Monocytes Relative: 10.7 % (ref 3.0–12.0)
Neutro Abs: 5.2 K/uL (ref 1.4–7.7)
Neutrophils Relative %: 62.4 % (ref 43.0–77.0)
Platelets: 186 K/uL (ref 150.0–400.0)
RBC: 4.47 Mil/uL (ref 4.22–5.81)
RDW: 14.9 % (ref 11.5–15.5)
WBC: 8.3 K/uL (ref 4.0–10.5)

## 2017-06-11 NOTE — Patient Instructions (Signed)
Order- CXR    Dx asthma moderate persistent              Lab- CBC w Diff, Allergy profile  Order- [facility]ME [facility]- please replace old CPAP machine, change to auto 10-20, mask of choice, humidifier, supplies, AirView    Dx OSA  Order- office spirometry

## 2017-06-11 NOTE — Progress Notes (Signed)
Subjective:    Patient ID: [name], male    DOB: [birth year], 67 y.o.   MRN: [id number]  HPI male never smoker followed for OSA with hypersomnia, history asthma, allergic rhinitis, complicated by HBP, RBBB CPAP 15/Advanced Eos- 1.0, 1.0, 1.06, 0, 0.7 ( Nl 0.15- 0.5) Office spirometry 12/26/2015-WNL-FVC 3.86/101%, FEV1 2.74/96%, ratio 0.71, 25-75 percent 1.82/78%. FENO-12/26/2015- 30 (mildly elevated) ANCA- neg ANA + 1:80 Respiratory Allergy Profile 06/11/16--total IgE WNL 88, mild elevations for dust mite, cat, dog, aspergillus fumigatus, hickory tree pollen Office Spirometry 06/11/2016-WNL-FVC 3.95/103%, FEV1 2.71/95%, ratio 0.69, FEF 25-75% 1.60/ 69% -----------------------------------------------------------------------------------  06/11/2016-67 year old male never smoker followed for OSA with hypersomnia, history asthma, allergic rhinitis, complicated by HBP, RBBB CPAP 15/Advanced FOLLOWS FOR: DME: [facility]. Pt wears CPAP nightly and no new supplies needed at this time. No recent DL. Pt states he has been doing well with everyone being sick. He asks update his approval for scuba diving. Dr. Regis Bill had notified me of increased eosinophilia and we had outpatient labs done 05/28/16- CBC w diff  01/02/16- EOS 1.06 H CBC w diff  05/28/16- WBC 6,500, , 92.6% PMN, 5.4% Lymphs, Eos 0.0 CXR 05/28/2016- No active cardiopulmonary disease. ANA- Pos 1:80 Sed rate - 4 ACE- WNL 30  ANCA  Negative Respiratory Allergy Profile-total IgE WNL 88, mild elevations for dust mite, cat, dog, aspergillus fumigatus, hickory tree pollen He reports feeling very well, going to gym regularly for the past 3 or 4 years. He needed a form completed so he could take an advanced scuba course after doing very well with beginning course last year. He has not had any recent illness and denies rashes, adenopathy, fevers  06/11/17- 67 year old male never smoker followed for OSA with hypersomnia, history asthma, allergic  rhinitis, complicated by HBP, RBBB CPAP 15/Advanced>> hope to replace old machine and change to auto 10-20. CBC with differential 04/03/17- EOS 20.2 H, 1.6 absolute Asthma exacerbation treated by Dr.Panosh in November.  He had blamed wet moldy leaves for coughing, wheezing, itching, watering and irritated eyes in January. ----OSA. DME: [facility] pt wears CPAP nightly and DL attached. Pt is having trouble with face mask and hose.  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  Uses rescue inhaler before entering the water.  Hyper eosinophilia being evaluated-hematologist recommended he stop Accolate, possibly because of concern of eosinophilic process like Churg-Strauss.  That is when he began to note more wheezing.  Is using rescue inhaler 4-6 times daily while continuing Advair.  No definite spring pollen rhinitis yet but feels some nasal congestion without headache or purulent discharge. Doing very well with CPAP except the hose link to mask is loose and not functioning correctly.  Download compliance 100%, AHI 0.8/hour. Office Spirometry 06/11/17-minimal obstructive airways disease, small airways.  FVC 3.62/95%, FEV1 2.49/88%, ratio 1.69, FEF 25-75% 1.39/61%  ROS-see HPI + = positive Constitutional:   No-   weight loss, night sweats, fevers, chills, fatigue, lassitude. HEENT:   No-  headaches, difficulty swallowing, tooth/dental problems, sore throat,       No-  sneezing, itching, ear ache, nasal congestion, post nasal drip,  CV:  No-   chest pain, orthopnea, PND, swelling in lower extremities, anasarca, dizziness, palpitations Resp: No-   shortness of breath with exertion or at rest.              No-   productive cough,  No non-productive cough,  No- coughing up of  blood.              No-   change in color of mucus.  + wheezing.   Skin: No-   rash or lesions. GI:  No-   heartburn, indigestion, abdominal pain, nausea,  vomiting,  GU: n. MS:  No-   joint pain or swelling.   Neuro-  +early Parkinson's Psych:  No- change in mood or affect. No depression or anxiety.  No memory loss.  OBJ- Physical Exam General- Alert, Oriented, Affect-appropriate, Distress- none acute,  Skin- rash-none, lesions- none, excoriation- none Lymphadenopathy- none Head- atraumatic            Eyes- Gross vision intact, PERRLA, conjunctivae and secretions clear            Ears- Hearing, canals-normal            Nose- Clear, + external dev, no- mucus, polyps, erosion, perforation             Throat- Mallampati II , mucosa clear , drainage- none, tonsils- atrophic Neck- flexible , trachea midline, no stridor , thyroid nl, carotid no bruit Chest - symmetrical excursion , unlabored           Heart/CV- RRR , no murmur , no gallop  , no rub, nl s1 s2                           - JVD- none , edema- none, stasis changes- none, varices- none           Lung- clear to P&A, wheeze+ trace, cough- none , dullness-none, rub- none           Chest wall-  Abd-  Br/ Gen/ Rectal- Not done, not indicated Extrem- cyanosis- none, clubbing, none, atrophy- none, strength- nl, + surgical scar L knee Neuro-non-focal

## 2017-06-11 NOTE — Assessment & Plan Note (Signed)
Pattern now is more moderate persistent.  He is having to use rescue inhaler more than I like.  We will reassess IgE/eosinophil status considering whether he might benefit from a biologic IL-5 product like Nucala, which I discussed with him today.

## 2017-06-11 NOTE — Assessment & Plan Note (Signed)
Great compliance and control.  He definitely benefits from CPAP.  His machine is old, fittings are wearing out and it is time for replacement.  We can change to AutoPap 10-20

## 2017-06-12 LAB — RESPIRATORY ALLERGY PROFILE REGION II ~~LOC~~
ALLERGEN, A. ALTERNATA, M6: 0.65 kU/L — AB
ALLERGEN, D PTERNOYSSINUS, D1: 19.4 kU/L — AB
Allergen, Cedar tree, t12: 0.1 kU/L — ABNORMAL HIGH
Allergen, Cottonwood, t14: <0.1 kU/L
Allergen, Mulberry, t76: <0.1 kU/L
Allergen, Oak,t7: <0.1 kU/L
Allergen, P. notatum, m1: 0.77 kU/L — ABNORMAL HIGH
Aspergillus fumigatus, m3: 0.85 kU/L — ABNORMAL HIGH
CLADOSPORIUM HERBARUM (M2) IGE: 0.21 kU/L — ABNORMAL HIGH
CLASS: 0
CLASS: 0
CLASS: 0
CLASS: 0
CLASS: 0
CLASS: 0
CLASS: 0
CLASS: 0
CLASS: 0
CLASS: 0
CLASS: 2
CLASS: 3
CLASS: 4
COMMON RAGWEED (SHORT) (W1) IGE: <0.1 kU/L
Cat Dander: 4.24 kU/L — ABNORMAL HIGH
Class: 0
Class: 0
Class: 0
Class: 0
Class: 0
Class: 0
Class: 1
Class: 1
Class: 2
Class: 2
Class: 3
Cockroach: <0.1 kU/L
D. farinae: 15 kU/L — ABNORMAL HIGH
Dog Dander: 2.66 kU/L — ABNORMAL HIGH
IgE (Immunoglobulin E), Serum: 279 kU/L — ABNORMAL HIGH (ref <=114)
Johnson Grass: <0.1 kU/L
PECAN/HICKORY TREE IGE: 0.53 kU/L — AB
Rough Pigweed  IgE: <0.1 kU/L

## 2017-06-12 LAB — INTERPRETATION:

## 2017-07-01 ENCOUNTER — Other Ambulatory Visit: Payer: Self-pay | Admitting: Internal Medicine

## 2017-08-05 ENCOUNTER — Telehealth: Payer: Self-pay | Admitting: Internal Medicine

## 2017-08-05 NOTE — Telephone Encounter (Signed)
Form has been retrieved from upfront. This has been placed on Katie's laptop since this form could be personally handed to her.

## 2017-08-05 NOTE — Telephone Encounter (Signed)
Patient dropped off forms. Forms are in CY folder at the front desk. Pt CB number is [phone]

## 2017-08-05 NOTE — Telephone Encounter (Signed)
Spoke with the pt  I advised that CDY not back until 08/06/17, and that he can go ahead and drop off the form and we will do our best to complete it before or on 08/07/17  He states he will drop it off this am Will hold in triage until form received and then will forward to [facility]

## 2017-08-06 NOTE — Telephone Encounter (Signed)
Pt is aware that forms were completed and signed by CY. Forms at front for pick up. Nothing more needed at this time.

## 2017-08-13 ENCOUNTER — Telehealth: Payer: Self-pay | Admitting: Internal Medicine

## 2017-08-13 NOTE — Telephone Encounter (Signed)
Personally delivered paperwork to [facility]. CY will sign and I will fax!

## 2017-08-13 NOTE — Telephone Encounter (Signed)
CY signed form in correct place and this has been faxed back-confirmation rec'd and placed in Scan folder at front. Nothing more needed at this time.

## 2017-08-13 NOTE — Telephone Encounter (Signed)
[name] is taking care of this per [name].

## 2017-08-21 ENCOUNTER — Encounter: Payer: Self-pay | Admitting: Internal Medicine

## 2017-08-25 ENCOUNTER — Encounter: Payer: Self-pay | Admitting: Internal Medicine

## 2017-08-25 ENCOUNTER — Telehealth: Payer: Self-pay | Admitting: Internal Medicine

## 2017-08-25 NOTE — Telephone Encounter (Signed)
CY please advise when forms are signed.

## 2017-08-25 NOTE — Telephone Encounter (Signed)
Done

## 2017-08-25 NOTE — Telephone Encounter (Signed)
Forms given to CY.

## 2017-08-25 NOTE — Telephone Encounter (Signed)
Katie- we did some forms last month. I haven't seen another set.

## 2017-08-25 NOTE — Telephone Encounter (Signed)
ERROR

## 2017-08-27 NOTE — Telephone Encounter (Signed)
Katie please advise. Thanks. 

## 2017-08-27 NOTE — Telephone Encounter (Signed)
Pt is aware that forms are completed and at front for pick up. Nothing more needed at this time.

## 2017-09-16 ENCOUNTER — Encounter: Payer: Self-pay | Admitting: Internal Medicine

## 2017-09-16 ENCOUNTER — Ambulatory Visit (INDEPENDENT_AMBULATORY_CARE_PROVIDER_SITE_OTHER): Payer: BC Managed Care – PPO | Admitting: Adult Health

## 2017-09-16 ENCOUNTER — Encounter: Payer: Self-pay | Admitting: Adult Health

## 2017-09-16 VITALS — BP 110/70 | Temp 97.8°F | Wt 207.0 lb

## 2017-09-16 DIAGNOSIS — Z4802 Encounter for removal of sutures: Secondary | ICD-10-CM | POA: Diagnosis not present

## 2017-09-16 NOTE — Progress Notes (Signed)
Subjective:    Patient ID: [name], male    DOB: December 06, 1950, 67 y.o.   MRN: [id number]  HPI 67 year old male who  has a past medical history of Arthritis, Asthma, Depression, Elevated PSA, Hypertension, Pneumonia (2005), Rhinosinusitis, and Sleep apnea.  He presents to the office today for suture removal. Per patient report he was in the [place] on a scuba diving trip when he sustained a wound to his left calf. Mechanism of injury is unknown. Reports he was loading tanks onto the boat and he is unsure if he cut it on the metal from the tanks or on the wooden dock. He had five sutures placed on that day 09/08/2017 and had an updated tetanus booster   Review of Systems See HPI Past Medical History:  Diagnosis Date  . Arthritis    knees  . Asthma   . Depression   . Elevated PSA    [place] and bx negative 2011  . Hypertension    nl cardiolite in 1998  . Pneumonia 2005   right hospitalized   . Rhinosinusitis   . Sleep apnea    wears CPAP    Social History   Socioeconomic History  . Marital status: Single    Spouse name: Not on file  . Number of children: Not on file  . Years of education: Not on file  . Highest education level: Not on file  Occupational History  . 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 Financial resource strain: Not on file  . Food insecurity:    Worry: Not on file    Inability: Not on file  . Transportation needs:    Medical: Not on file    Non-medical: Not on file  Tobacco Use  . Smoking status: Never Smoker  . Smokeless tobacco: Never Used  Substance and Sexual Activity  . Alcohol use: No    Comment: Quit drinking ETOH 1986  . Drug use: No  . Sexual activity: Not on file  Lifestyle  . Physical activity:    Days per week: Not on file    Minutes per session: Not on file  . Stress: Not on file  Relationships  . Social connections:    Talks on phone: Not on file    Gets together: Not on file   Attends religious service: Not on file    Active member of club or organization: Not on file    Attends meetings of clubs or organizations: Not on file    Relationship status: Not on file  . Intimate partner violence:    Fear of current or ex partner: Not on file    Emotionally abused: Not on file    Physically abused: Not on file    Forced sexual activity: Not on file  Other Topics Concern  . 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 FACIAL RECONSTRUCTION SURGERY    . NASAL SINUS SURGERY    . 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 shoulder atrhroscopic surgery     Right  . VENTRAL HERNIA REPAIR  03-2007   rerepair winter 2010    Family History  Problem Relation Age  of Onset  . Heart failure Father        MV replacement  . Asthma Mother   . Asthma Unknown   . Hypertension Unknown   . Diabetes Maternal Grandmother   . Healthy Sister   . Healthy Brother   . Healthy Brother   . Colon cancer Neg Hx   . Pancreatic cancer Neg Hx   . Stomach cancer Neg Hx   . Esophageal cancer Neg Hx     Allergies  Allergen Reactions  . Eggs Or Egg-Derived Products     Nausea and diarrhea    Current Outpatient Medications on File Prior to Visit  Medication Sig Dispense Refill  . amLODipine-benazepril (LOTREL) 5-10 MG capsule TAKE 1 CAPSULE BY MOUTH EVERY DAY 90 capsule 1  . ammonium lactate (AMLACTIN) 12 % cream Use as directed as needed     . azelastine (ASTELIN) 137 MCG/SPRAY nasal spray USE AS DIRECTED 30 mL 5  . COMBIVENT RESPIMAT 20-100 MCG/ACT AERS respimat INHALE 2 PUFFS UP TO FOUR TIMES A DAY AS NEEDED 1 Inhaler 5  . escitalopram (LEXAPRO) 20 MG tablet TAKE ONE AND ONE-HALF TABLET BY MOUTH DAILY 135 tablet 1  . fexofenadine (ALLEGRA) 180 MG tablet Take 180 mg by mouth daily.      .  Fluticasone-Salmeterol (ADVAIR DISKUS) 100-50 MCG/DOSE AEPB INHALE 1 PUFF INTO THE LUNGS 2 (TWO) TIMES DAILY. 60 each 6  . ibuprofen (ADVIL,MOTRIN) 800 MG tablet TAKE ONE TABLET BY MOUTH EVERY 8 HOURS AS NEEDED 60 tablet 1  . mometasone (NASONEX) 50 MCG/ACT nasal spray USE 1 TO 2 SPRAYS IN EACH NOSTRIL DAILY 17 g 3  . protriptyline (VIVACTIL) 10 MG tablet TAKE 1 TABLET (10 MG TOTAL) BY MOUTH AT BEDTIME. 30 tablet 11  . silodosin (RAPAFLO) 8 MG CAPS capsule Take 1 capsule (8 mg total) by mouth at bedtime. 30 capsule 11   No current facility-administered medications on file prior to visit.     BP 110/70   Temp 97.8 F (36.6 C) (Oral)   Wt 207 lb (93.9 kg)   BMI 34.45 kg/m       Objective:   Physical Exam  Constitutional: He is oriented to person, place, and time. He appears well-developed and well-nourished. No distress.  Neurological: He is alert and oriented to person, place, and time.  Skin: Skin is warm and dry. He is not diaphoretic.  Five simple sutures in 'V' pattern on left calf. No active signs of infection   Nursing note and vitals reviewed.     Assessment & Plan:  1. Visit for suture removal - Sutures easily removed. Patient tolerated procedure well. Bandaged  - Advised to monitor for signs of infection  - Clean with soap and water in the shower.  - Follow up as needed  Dorothyann Peng, NP

## 2017-09-17 ENCOUNTER — Ambulatory Visit: Payer: BC Managed Care – PPO | Admitting: Internal Medicine

## 2017-09-17 ENCOUNTER — Encounter: Payer: Self-pay | Admitting: Internal Medicine

## 2017-09-17 DIAGNOSIS — R609 Edema, unspecified: Secondary | ICD-10-CM | POA: Diagnosis not present

## 2017-09-17 DIAGNOSIS — G4733 Obstructive sleep apnea (adult) (pediatric): Secondary | ICD-10-CM | POA: Diagnosis not present

## 2017-09-17 DIAGNOSIS — J4520 Mild intermittent asthma, uncomplicated: Secondary | ICD-10-CM

## 2017-09-17 NOTE — Assessment & Plan Note (Signed)
He continues to benefit from CPAP.  Download confirms excellent compliance and control with no changes needed. Plan-continue CPAP auto 10-20

## 2017-09-17 NOTE — Assessment & Plan Note (Signed)
Bilateral ankle edema after returning from diving trip in [place].  He owns elastic hose and I discussed use which will be limited for a while until the laceration on his left calf heals.  Emphasized elevation, mobilization, prevention of stasis.  There is no personal or family history of DVT.  He is not in overt heart failure.  His PCP may choose to give him a diuretic if this does not clear quickly.

## 2017-09-17 NOTE — Progress Notes (Signed)
Subjective:    Patient ID: [name], male    DOB: 01-29-1951, 67 y.o.   MRN: [id number]  HPI male never smoker followed for OSA with hypersomnia, history asthma, allergic rhinitis, complicated by HBP, RBBB CPAP 15/Advanced Eos- 1.0, 1.0, 1.06, 0, 0.7 ( Nl 0.15- 0.5) Office spirometry 12/26/2015-WNL-FVC 3.86/101%, FEV1 2.74/96%, ratio 0.71, 25-75 percent 1.82/78%. FENO-12/26/2015- 30 (mildly elevated) ANCA- neg ANA + 1:80 Respiratory Allergy Profile 06/11/16--total IgE WNL 88, mild elevations for dust mite, cat, dog, aspergillus fumigatus, hickory tree pollen Office Spirometry 06/11/2016-WNL-FVC 3.95/103%, FEV1 2.71/95%, ratio 0.69, FEF 25-75% 1.60/ 69% -----------------------------------------------------------------------------------  06/11/17- 67 year old male never smoker followed for OSA with hypersomnia, history asthma, allergic rhinitis, complicated by HBP, RBBB CPAP 15/Advanced>> hope to replace old machine and change to auto 10-20. CBC with differential 04/03/17- EOS 20.2 H, 1.6 absolute Asthma exacerbation treated by Dr.Panosh in November.  He had blamed wet moldy leaves for coughing, wheezing, itching, watering and irritated eyes in January. ----OSA. DME: [facility] pt wears CPAP nightly and DL attached. Pt is having trouble with face mask and hose.  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  Uses rescue inhaler before entering the water.  Hyper eosinophilia being evaluated-hematologist recommended he stop Accolate, possibly because of concern of eosinophilic process like Churg-Strauss.  That is when he began to note more wheezing.  Is using rescue inhaler 4-6 times daily while continuing Advair.  No definite spring pollen rhinitis yet but feels some nasal congestion without headache or purulent discharge. Doing very well with CPAP except the hose link to mask is loose and not functioning correctly.   Download compliance 100%, AHI 0.8/hour. Office Spirometry 06/11/17-minimal obstructive airways disease, small airways.  FVC 3.62/95%, FEV1 2.49/88%, ratio 1.69, FEF 25-75% 1.39/61%  09/17/2017- 67 year old male never smoker followed for OSA with hypersomnia, history asthma, allergic rhinitis, complicated by HBP, RBBB -----OSA; DME: [facility] Pt wears CPAP nightly and DL attached. Travels with CPAP as well. Denies any issues with breathing at this time  Advair 100/50, Combivent respimat, Has been scuba diving with no breathing problems whatsoever except that he fell off a dock and cut his left lower leg requiring stitches.  Has been having bilateral pedal edema and has support hose.  CPAP doing very well with no problems.  He takes it on trips.  Download compliance 93% AHI 1.0/hour. ROS-see HPI + = positive Constitutional:   No-   weight loss, night sweats, fevers, chills, fatigue, lassitude. HEENT:   No-  headaches, difficulty swallowing, tooth/dental problems, sore throat,       No-  sneezing, itching, ear ache, nasal congestion, post nasal drip,  CV:  No-   chest pain, orthopnea, PND, +swelling in lower extremities, anasarca, dizziness, palpitations Resp: No-   shortness of breath with exertion or at rest.              No-   productive cough,  No non-productive cough,  No- coughing up of blood.              No-   change in color of mucus.  + wheezing.   Skin: No-   rash or lesions. GI:  No-   heartburn, indigestion, abdominal pain, nausea, vomiting,  GU: n. MS:  No-   joint pain or swelling.   Neuro-  +early Parkinson's Psych:  No- change in mood or affect. No depression or anxiety.  No memory loss.  OBJ- Physical  Exam General- Alert, Oriented, Affect-appropriate, Distress- none acute,  Skin- rash-none, lesions- none, excoriation- none Lymphadenopathy- none Head- atraumatic            Eyes- Gross vision intact, PERRLA, conjunctivae and secretions clear            Ears- Hearing, canals-normal             Nose- Clear, + external dev, no- mucus, polyps, erosion, perforation             Throat- Mallampati II , mucosa clear , drainage- none, tonsils- atrophic Neck- flexible , trachea midline, no stridor , thyroid nl, carotid no bruit Chest - symmetrical excursion , unlabored           Heart/CV- RRR , no murmur , no gallop  , no rub, nl s1 s2                           - JVD- none , edema =1, stasis changes- none, varices- none           Lung- clear to P&A, wheeze- none, cough- none , dullness-none, rub- none           Chest wall-  Abd-  Br/ Gen/ Rectal- Not done, not indicated Extrem- cyanosis- none, clubbing, none, atrophy- none, strength- nl, + surgical scar L knee, +healing flap laceration L calf. Neuro-non-focal

## 2017-09-17 NOTE — Patient Instructions (Signed)
We can continue current meds  We can continue CPAP auto 10-20, mask of choice, humidifier, supplies, AirView  Try to elevate your legs to get the edema out, avoid prolonged sitting and stasis in the leg veins.  Follow up with Dr Regis Bill as needed for the leg wound.

## 2017-09-17 NOTE — Assessment & Plan Note (Signed)
He pretreats with Combivent Respimat before dives and has had no respiratory problems at all.  We have discussed potential for pneumothorax and other complications of asthma with diving.  He only dives with groups.  No exacerbations.  He is not needing routine use of rescue inhaler.

## 2017-09-23 ENCOUNTER — Other Ambulatory Visit: Payer: Self-pay | Admitting: Internal Medicine

## 2017-09-30 ENCOUNTER — Other Ambulatory Visit: Payer: Self-pay | Admitting: Internal Medicine

## 2017-10-07 ENCOUNTER — Other Ambulatory Visit: Payer: Self-pay | Admitting: Internal Medicine

## 2017-10-07 NOTE — Telephone Encounter (Signed)
Ok 90 days, ref x 3

## 2017-10-07 NOTE — Telephone Encounter (Signed)
CY Please advise on refill. thanks

## 2017-10-17 ENCOUNTER — Other Ambulatory Visit: Payer: Self-pay | Admitting: Internal Medicine

## 2017-10-19 NOTE — Telephone Encounter (Signed)
Please advise Dr Panosh, thanks.   

## 2017-10-19 NOTE — Telephone Encounter (Signed)
Last OV with PCP 02/09/2017   Last seen by NP Cory on 09/16/2017   Rx not listed on pt's current medication list

## 2017-12-03 ENCOUNTER — Telehealth: Payer: Self-pay | Admitting: Internal Medicine

## 2017-12-03 NOTE — Telephone Encounter (Signed)
Called and spoke to patient. Relayed Dr. Janee Morn recommendations. Patient verbalized understanding. Nothing further needed at this time.

## 2017-12-03 NOTE — Telephone Encounter (Signed)
Ok - he can continue his amoxacillin then, and call back if needs further antibiotic for sinus disease.  Suggest he do saline nasal rinse, like a Neti pot, once or twice daily  Suggest otc Mucinex to help this secretions.

## 2017-12-03 NOTE — Telephone Encounter (Signed)
Spoke with pt, c/o increased sinus congestion, pnd, sometimes prod cough with thick yellow/gray mucus Xseveral days.   Denies chest pain, fever, chills.   Has been taking mucinex as well as maintenance medications.  Requesting additional recs.   Pharmacy: [facility].    CY please advise on additional recs.  Thanks!

## 2017-12-03 NOTE — Telephone Encounter (Signed)
Called and spoke to patient. Let him know of CY's recommendations. Patient then stated that he is currently taking Amoxicillin 500 mg TID related to a dental procedure. Patient stated he has 2 days past today left of antibiotics.   CY please advise.

## 2017-12-03 NOTE — Telephone Encounter (Signed)
Suggest treating as sinus infection  Offer augmentin 875 mg, # 14, 1 twice daily Stay well- hydrated to keep mucus thin

## 2017-12-19 ENCOUNTER — Other Ambulatory Visit: Payer: Self-pay | Admitting: Internal Medicine

## 2018-01-05 ENCOUNTER — Telehealth: Payer: Self-pay | Admitting: Internal Medicine

## 2018-01-05 MED ORDER — PREDNISONE 10 MG PO TABS: ORAL_TABLET | ORAL | 0 refills | Status: DC

## 2018-01-05 NOTE — Telephone Encounter (Signed)
Called and spoke with patient he is aware. Medication sent in. Nothing further needed.

## 2018-01-05 NOTE — Telephone Encounter (Signed)
Offer prednisone 10 mg, # 20, 4 X 2 DAYS, 3 X 2 DAYS, 2 X 2 DAYS, 1 X 2 DAYS  

## 2018-01-05 NOTE — Telephone Encounter (Signed)
Called and spoke with patient, he stated that he is not feeling well. Increased SOB, coughing and wheezing. Congestion in nose and chest. Denies fever and no chest pain. Denies body aches, no chills. No sneezing.   Patient is requesting a prednisone taper.  CY please advise, thank you.   Current Outpatient Medications on File Prior to Visit  Medication Sig Dispense Refill  . amLODipine-benazepril (LOTREL) 5-10 MG capsule TAKE 1 CAPSULE BY MOUTH EVERY DAY 90 capsule 1  . ammonium lactate (AMLACTIN) 12 % cream Use as directed as needed     . azelastine (ASTELIN) 137 MCG/SPRAY nasal spray USE AS DIRECTED 30 mL 5  . COMBIVENT RESPIMAT 20-100 MCG/ACT AERS respimat INHALE 2 PUFFS UP TO FOUR TIMES A DAY AS NEEDED 1 Inhaler 5  . escitalopram (LEXAPRO) 20 MG tablet Take 1.5 tablets (30 mg total) by mouth daily. **DUE FOR ANNUAL PHYSICAL FOR FURTHER REFILLS. 45 tablet 0  . fexofenadine (ALLEGRA) 180 MG tablet Take 180 mg by mouth daily.      Marland Kitchen ibuprofen (ADVIL,MOTRIN) 800 MG tablet TAKE ONE TABLET BY MOUTH EVERY 8 HOURS AS NEEDED 60 tablet 1  . mometasone (NASONEX) 50 MCG/ACT nasal spray USE 1 TO 2 SPRAYS IN EACH NOSTRIL DAILY 17 g 3  . protriptyline (VIVACTIL) 10 MG tablet TAKE 1 TABLET BY MOUTH EVERYDAY AT BEDTIME 90 tablet 3  . silodosin (RAPAFLO) 8 MG CAPS capsule Take 1 capsule (8 mg total) by mouth at bedtime. 30 capsule 11  . WIXELA INHUB 100-50 MCG/DOSE AEPB TAKE 1 PUFF BY MOUTH TWICE A DAY 60 each 6   No current facility-administered medications on file prior to visit.    Allergies  Allergen Reactions  . Eggs Or Egg-Derived Products     Nausea and diarrhea

## 2018-01-20 ENCOUNTER — Other Ambulatory Visit: Payer: Self-pay | Admitting: Internal Medicine

## 2018-01-20 NOTE — Telephone Encounter (Signed)
No further refills after today.  Pt needs OV before any further refills are sent.

## 2018-01-24 ENCOUNTER — Other Ambulatory Visit: Payer: Self-pay | Admitting: Internal Medicine

## 2018-01-27 NOTE — Progress Notes (Signed)
Chief Complaint  Patient presents with  . Follow-up    BP meds  . Bleeding/Bruising    Pt bruising easily. Multiple bruises on arms/hands, some bruising on legs from time to time. Pt states that he will bruise if he just scratches his arm.    HPI: [name] 67 y.o. come in for fu  bp meds and  New problem  Last visit witth me was a year ago . bursiing with minim,al hitting mostly arms  With  Minor hitting and if scratching a lot .   Denies  New arthritis hand sx   No bleeding nose gi gu   Stable prostate eval  Per urio  Asthma: stable but did have pred use last month   bp has been good   Mood dec lexapro to 20 mg per day  Feels  Energized  Scuba diving   Passion and  Plans retiring at end of year      Back to exercise after  shoulder surgery took a while  Sees derm for eczema and has  Itching .     ROS: See pertinent positives and negatives per HPI. No cps ob bleeding   New meds  hasnt been on asas for a while and recet stopped ibu in case adding  Past Medical History:  Diagnosis Date  . Arthritis    knees  . Asthma   . Depression   . Elevated PSA    [place] and bx negative 2011  . Hypertension    nl cardiolite in 1998  . Pneumonia 2005   right hospitalized   . Rhinosinusitis   . Sleep apnea    wears CPAP    Family History  Problem Relation Age of Onset  . Heart failure Father        MV replacement  . Asthma Mother   . Asthma Unknown   . Hypertension Unknown   . Diabetes Maternal Grandmother   . Healthy Sister   . Healthy Brother   . Healthy Brother   . Colon cancer Neg Hx   . Pancreatic cancer Neg Hx   . Stomach cancer Neg Hx   . Esophageal cancer Neg Hx     Social History   Socioeconomic History  . Marital status: Single    Spouse name: Not on file  . Number of children: Not on file  . Years of education: Not on file  . Highest education level: Not on file  Occupational History  . 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 Financial resource strain: Not on file  . Food insecurity:    Worry: Not on file    Inability: Not on file  . Transportation needs:    Medical: Not on file    Non-medical: Not on file  Tobacco Use  . Smoking status: Never Smoker  . Smokeless tobacco: Never Used  Substance and Sexual Activity  . Alcohol use: No    Comment: Quit drinking ETOH 1986  . Drug use: No  . Sexual activity: Not on file  Lifestyle  . Physical activity:    Days per week: Not on file    Minutes per session: Not on file  . Stress: Not on file  Relationships  . Social connections:    Talks on phone: Not on file    Gets together: Not on file    Attends religious service: Not on file    Active member of club  or organization: Not on file    Attends meetings of clubs or organizations: Not on file    Relationship status: Not on file  Other Topics Concern  . 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 amLODipine-benazepril (LOTREL) 5-10 MG capsule TAKE 1 CAPSULE BY MOUTH EVERY DAY 90 capsule 1  . azelastine (ASTELIN) 137 MCG/SPRAY nasal spray USE AS DIRECTED 30 mL 5  . clobetasol (OLUX) 0.05 % topical foam Apply topically as needed.    . COMBIVENT RESPIMAT 20-100 MCG/ACT AERS respimat INHALE 2 PUFFS UP TO FOUR TIMES A DAY AS NEEDED 1 Inhaler 5  . escitalopram (LEXAPRO) 20 MG tablet TAKE 1.5 TABLETS (30 MG TOTAL) BY MOUTH DAILY. **DUE FOR ANNUAL PHYSICAL FOR FURTHER REFILLS. (Patient taking differently: Take 20-30 mg by mouth daily. **DUE FOR ANNUAL PHYSICAL FOR FURTHER REFILLS.) 45 tablet 0  . fexofenadine (ALLEGRA) 180 MG tablet Take 180 mg by mouth daily.      . mometasone (NASONEX) 50 MCG/ACT nasal spray USE 1 TO 2 SPRAYS IN EACH NOSTRIL DAILY 17 g 3  . protriptyline (VIVACTIL) 10 MG tablet TAKE 1 TABLET  BY MOUTH EVERYDAY AT BEDTIME 90 tablet 3  . silodosin (RAPAFLO) 8 MG CAPS capsule Take 1 capsule (8 mg total) by mouth at bedtime. 30 capsule 11  . triamcinolone cream (KENALOG) 0.1 % Apply 1 application topically as needed.    Grant Ruts INHUB 100-50 MCG/DOSE AEPB TAKE 1 PUFF BY MOUTH TWICE A DAY 60 each 6  . ibuprofen (ADVIL,MOTRIN) 800 MG tablet TAKE ONE TABLET BY MOUTH EVERY 8 HOURS AS NEEDED (Patient not taking: Reported on 01/28/2018) 60 tablet 1  . ammonium lactate (AMLACTIN) 12 % cream Use as directed as needed     . predniSONE (DELTASONE) 10 MG tablet 4 X 2 DAYS, 3 X 2 DAYS, 2 X 2 DAYS, 1 X 2 DAYS then STOP (Patient not taking: Reported on 01/28/2018) 30 tablet 0   No facility-administered medications prior to visit.      EXAM:  BP 122/78 (BP Location: Right Arm, Patient Position: Sitting, Cuff Size: Normal)   Pulse 86   Temp 97.8 F (36.6 C) (Oral)   Wt 214 lb 4.8 oz (97.2 kg)   BMI 35.66 kg/m   Body mass index is 35.66 kg/m.  GENERAL: vitals reviewed and listed above, alert, oriented, appears well hydrated and in no acute distress HEENT: atraumatic, conjunctiva  clear, no obvious abnormalities on inspection of external nose and ears NECK: no obvious masses on inspection palpation  LUNGS: clear to auscultation bilaterally, no wheezes, rales or rhonchi, good air movement Abdomen:  Sof,t normal bowel sounds without hepatosplenomegaly, liver at rcm  no guarding rebound or masses no CVA tenderness CV: HRRR, no clubbing cyanosis or  peripheral edema nl cap refill  MS: moves all extremities without noticeable focal  Abnormality  Healed scars knees shoulder   Hands some hyperemia and sub  Nail redness    No swelling Skin multiple large ecchymosis looking like senil form dorsal left hand and wrist and arm   Few on right   No petechia  Or other bruising.  PSYCH: pleasant and cooperative, no obvious depression or anxiety looks quite well  Lab Results  Component Value Date  WBC 8.3  06/11/2017   HGB 15.4 06/11/2017   HCT 44.3 06/11/2017   PLT 186.0 06/11/2017   GLUCOSE 85 11/07/2016   CHOL 152 11/07/2016   TRIG 55.0 11/07/2016   HDL 44.40 11/07/2016   LDLCALC 97 11/07/2016   ALT 20 11/07/2016   AST 24 11/07/2016   NA 135 11/07/2016   K 4.6 11/07/2016   CL 100 11/07/2016   CREATININE 0.76 11/07/2016   BUN 13 11/07/2016   CO2 29 11/07/2016   TSH 3.43 11/07/2016   PSA 7.83 (H) 10/26/2015   INR 0.96 06/20/2010   BP Readings from Last 3 Encounters:  01/28/18 122/78  09/17/17 114/70  09/16/17 110/70   Wt Readings from Last 3 Encounters:  01/28/18 214 lb 4.8 oz (97.2 kg)  09/17/17 204 lb 12.8 oz (92.9 kg)  09/16/17 207 lb (93.9 kg)     ASSESSMENT AND PLAN:  Discussed the following assessment and plan:  Essential hypertension - Plan: Basic metabolic panel, CBC with Differential/Platelet, Hepatic function panel, Lipid panel, TSH, APTT, CANCELED: Prothrombin Time  Medication management - Plan: Basic metabolic panel, CBC with Differential/Platelet, Hepatic function panel, Lipid panel, TSH, APTT, CANCELED: Prothrombin Time  Thrombocytopenia ([place]) - Plan: Basic metabolic panel, CBC with Differential/Platelet, Hepatic function panel, Lipid panel, TSH, APTT, Protime-INR, CANCELED: Prothrombin Time  Bruising - Plan: Basic metabolic panel, CBC with Differential/Platelet, Hepatic function panel, Lipid panel, TSH, APTT, Protime-INR, CANCELED: Prothrombin Time  Itching - Plan: Basic metabolic panel, CBC with Differential/Platelet, Hepatic function panel, Lipid panel, TSH, APTT, CANCELED: Prothrombin Time Repeat  Cbc and pt pttt liver etc     ecchymossis looks liek senil ecchymossis but skin not that thinning   No petechia   .  Suppose lexapro  And ibu which he stopped could be involved   Seeing dr Ronnald Ramp for itching and has been seen by heme  past for  plt and eosin in past .  But no bruising   His mood and activity level is good   To retire at end of year.  Plan  labs and maybe consult   But  Rest of exam reassuring.  He is much more active now    And  Mood  Much better.  -Patient advised to return or notify health care team  if  new concerns arise.  Patient Instructions  Checking blood test for bruising .  Looks like all is well  Possible skin thinning from  prednisone and   Medicines    consider   Getting consult.      To be sure all ok .  Plan follow up depending on   Labs and how doing.   Standley Brooking. Sharay Bellissimo M.D.

## 2018-01-28 ENCOUNTER — Encounter: Payer: Self-pay | Admitting: Internal Medicine

## 2018-01-28 ENCOUNTER — Ambulatory Visit: Payer: BC Managed Care – PPO | Admitting: Internal Medicine

## 2018-01-28 VITALS — BP 122/78 | HR 86 | Temp 97.8°F | Wt 214.3 lb

## 2018-01-28 DIAGNOSIS — I10 Essential (primary) hypertension: Secondary | ICD-10-CM | POA: Diagnosis not present

## 2018-01-28 DIAGNOSIS — T148XXA Other injury of unspecified body region, initial encounter: Secondary | ICD-10-CM

## 2018-01-28 DIAGNOSIS — Z79899 Other long term (current) drug therapy: Secondary | ICD-10-CM

## 2018-01-28 DIAGNOSIS — L299 Pruritus, unspecified: Secondary | ICD-10-CM

## 2018-01-28 DIAGNOSIS — D696 Thrombocytopenia, unspecified: Secondary | ICD-10-CM | POA: Diagnosis not present

## 2018-01-28 LAB — BASIC METABOLIC PANEL
BUN: 10 mg/dL (ref 6–23)
CO2: 30 mEq/L (ref 19–32)
CREATININE: 0.79 mg/dL (ref 0.40–1.50)
Calcium: 9.5 mg/dL (ref 8.4–10.5)
Chloride: 99 mEq/L (ref 96–112)
GFR: 104.19 mL/min (ref >60.00)
GLUCOSE: 108 mg/dL — AB (ref 70–99)
Potassium: 4.7 mEq/L (ref 3.5–5.1)
Sodium: 134 mEq/L — ABNORMAL LOW (ref 135–145)

## 2018-01-28 LAB — CBC WITH DIFFERENTIAL/PLATELET
BASOS PCT: 0.7 % (ref 0.0–3.0)
Basophils Absolute: 0 10*3/uL (ref 0.0–0.1)
EOS ABS: 0.6 10*3/uL (ref 0.0–0.7)
Eosinophils Relative: 8.2 % — ABNORMAL HIGH (ref 0.0–5.0)
HCT: 43.7 % (ref 39.0–52.0)
Hemoglobin: 15.2 g/dL (ref 13.0–17.0)
LYMPHS ABS: 0.9 10*3/uL (ref 0.7–4.0)
Lymphocytes Relative: 12 % (ref 12.0–46.0)
MCHC: 34.7 g/dL (ref 30.0–36.0)
MCV: 102.4 fl — ABNORMAL HIGH (ref 78.0–100.0)
MONO ABS: 1 10*3/uL (ref 0.1–1.0)
Monocytes Relative: 13.7 % — ABNORMAL HIGH (ref 3.0–12.0)
NEUTROS PCT: 65.4 % (ref 43.0–77.0)
Neutro Abs: 4.7 10*3/uL (ref 1.4–7.7)
Platelets: 200 10*3/uL (ref 150.0–400.0)
RBC: 4.26 Mil/uL (ref 4.22–5.81)
RDW: 14.7 % (ref 11.5–15.5)
WBC: 7.2 10*3/uL (ref 4.0–10.5)

## 2018-01-28 LAB — LIPID PANEL
CHOL/HDL RATIO: 3
CHOLESTEROL: 162 mg/dL (ref 0–200)
HDL: 50.7 mg/dL (ref >39.00)
LDL Cholesterol: 102 mg/dL — ABNORMAL HIGH (ref 0–99)
NonHDL: 111.29
TRIGLYCERIDES: 47 mg/dL (ref 0.0–149.0)
VLDL: 9.4 mg/dL (ref 0.0–40.0)

## 2018-01-28 LAB — PROTIME-INR
INR: 1 ratio (ref 0.8–1.0)
PROTHROMBIN TIME: 12.1 s (ref 9.6–13.1)

## 2018-01-28 LAB — HEPATIC FUNCTION PANEL
ALT: 25 U/L (ref 0–53)
AST: 25 U/L (ref 0–37)
Albumin: 4.6 g/dL (ref 3.5–5.2)
Alkaline Phosphatase: 70 U/L (ref 39–117)
BILIRUBIN DIRECT: 0.1 mg/dL (ref 0.0–0.3)
BILIRUBIN TOTAL: 0.9 mg/dL (ref 0.2–1.2)
Total Protein: 6.8 g/dL (ref 6.0–8.3)

## 2018-01-28 LAB — APTT: aPTT: 32.3 s (ref 23.4–32.7)

## 2018-01-28 LAB — TSH: TSH: 3.64 u[IU]/mL (ref 0.35–4.50)

## 2018-01-28 NOTE — Patient Instructions (Addendum)
Checking blood test for bruising .  Looks like all is well  Possible skin thinning from  prednisone and   Medicines    consider   Getting consult.      To be sure all ok .  Plan follow up depending on   Labs and how doing.

## 2018-02-05 ENCOUNTER — Encounter: Payer: Self-pay | Admitting: *Deleted

## 2018-02-17 LAB — PSA: PSA: 5.82

## 2018-03-10 ENCOUNTER — Other Ambulatory Visit: Payer: Self-pay | Admitting: Internal Medicine

## 2018-03-10 MED ORDER — IPRATROPIUM-ALBUTEROL 20-100 MCG/ACT IN AERS: INHALATION_SPRAY | RESPIRATORY_TRACT | 5 refills | Status: DC

## 2018-03-22 ENCOUNTER — Other Ambulatory Visit: Payer: Self-pay | Admitting: Internal Medicine

## 2018-04-01 ENCOUNTER — Other Ambulatory Visit: Payer: Self-pay

## 2018-04-01 MED ORDER — AMLODIPINE BESY-BENAZEPRIL HCL 5-10 MG PO CAPS: ORAL_CAPSULE | ORAL | 0 refills | Status: DC

## 2018-04-20 ENCOUNTER — Other Ambulatory Visit: Payer: Self-pay | Admitting: Internal Medicine

## 2018-04-22 NOTE — Telephone Encounter (Signed)
Last OV 01/28/18 Last refilled 01/07/18 #60 x 1 refill  Please advise Dr Regis Bill, thanks.

## 2018-05-27 ENCOUNTER — Telehealth: Payer: Self-pay | Admitting: *Deleted

## 2018-05-27 NOTE — Telephone Encounter (Signed)
Patient called office. Stated he had seen Dr. Irene Limbo in the past  [07/24/2016]. States his eosinophils have gone up. He stopped the medicine that was making them high. Wanted to know if he could be seen to discuss with Dr. Irene Limbo.  Information regarding patient concerns given to Dr. Irene Limbo who reviewed most recent pt labs [01/28/2018]. Per Dr. Irene Limbo, labs are stable with regards to eosinophils. Patient should f/u with PCP as needed and if PCP recommends return to Dr. Irene Limbo, please contact office.  Contacted patient. Informed pt that Dr. Irene Limbo had reviewed labs and found them stable at this time with regard to eosinophils.  Encouraged patient to f/u with PCP at this time. Dr. Irene Limbo will see as requested by PCP. Patient verbalized understanding.

## 2018-06-02 IMAGING — DX DG CHEST 2V
2 series · 2 of 2 positions shown · non-contrast
Comparison: 06/20/2010

CLINICAL DATA: Follow-up examination

EXAM:
CHEST  2 VIEW

[chest pa]
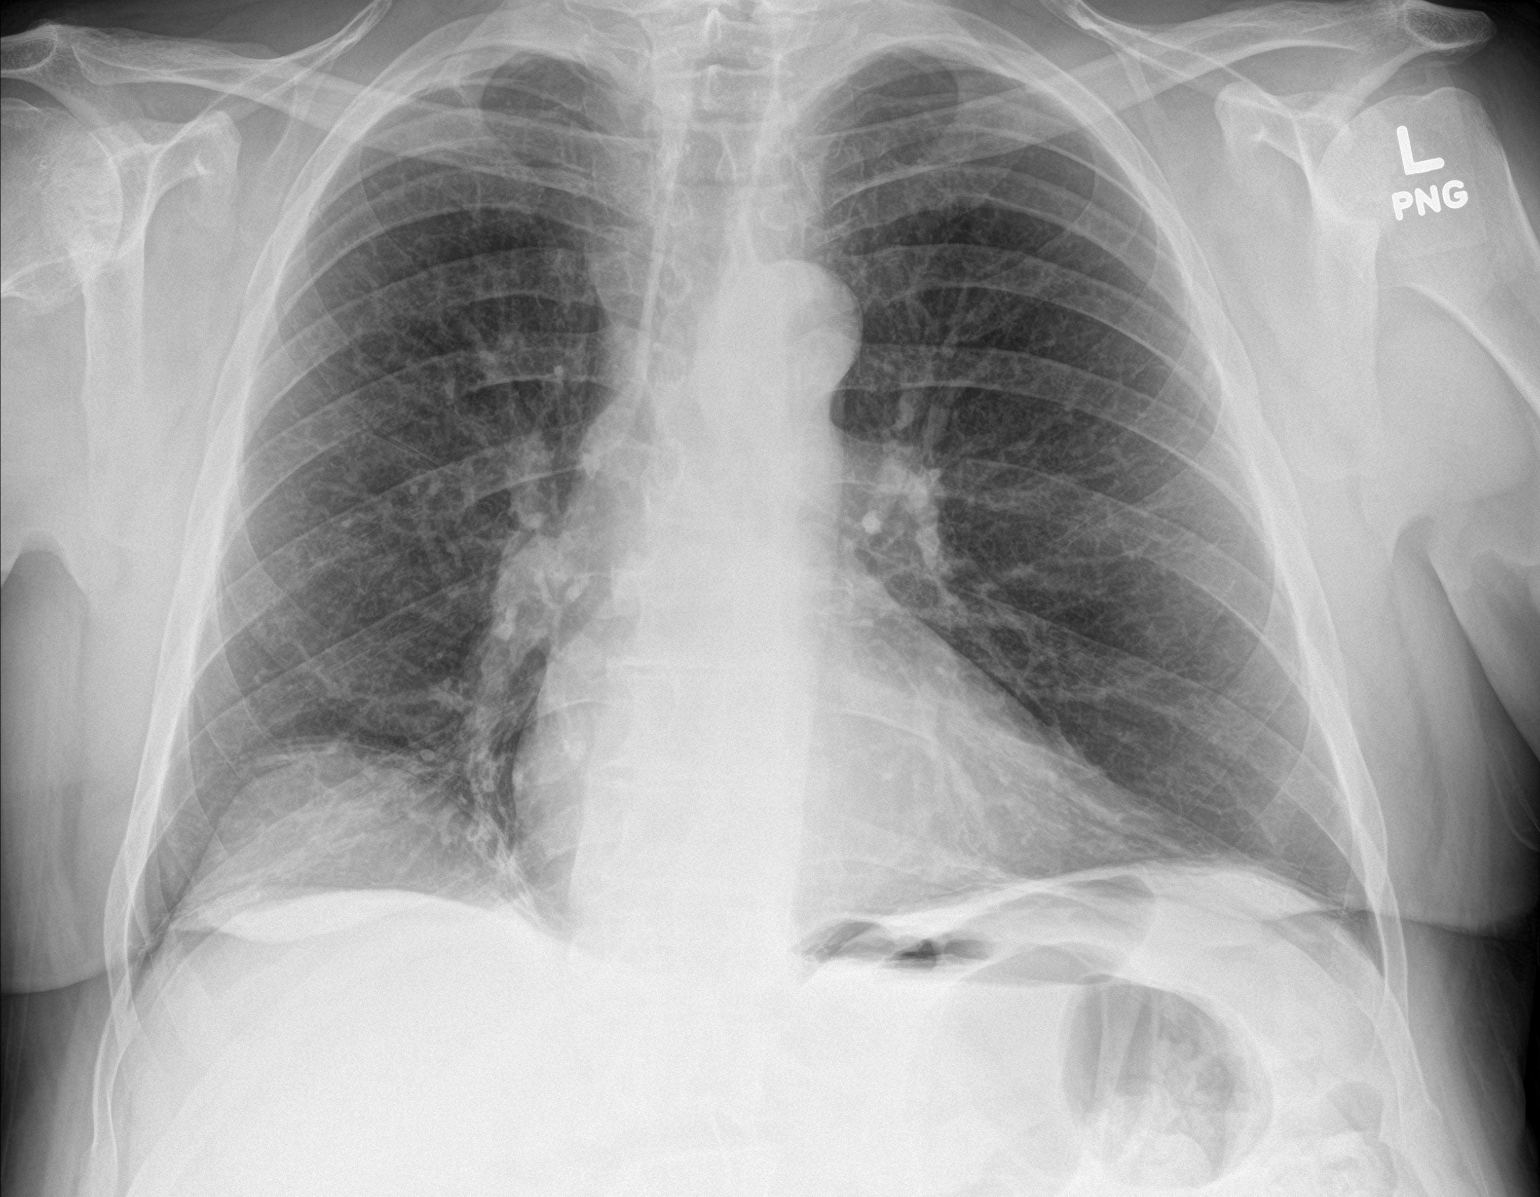

[chest lat]
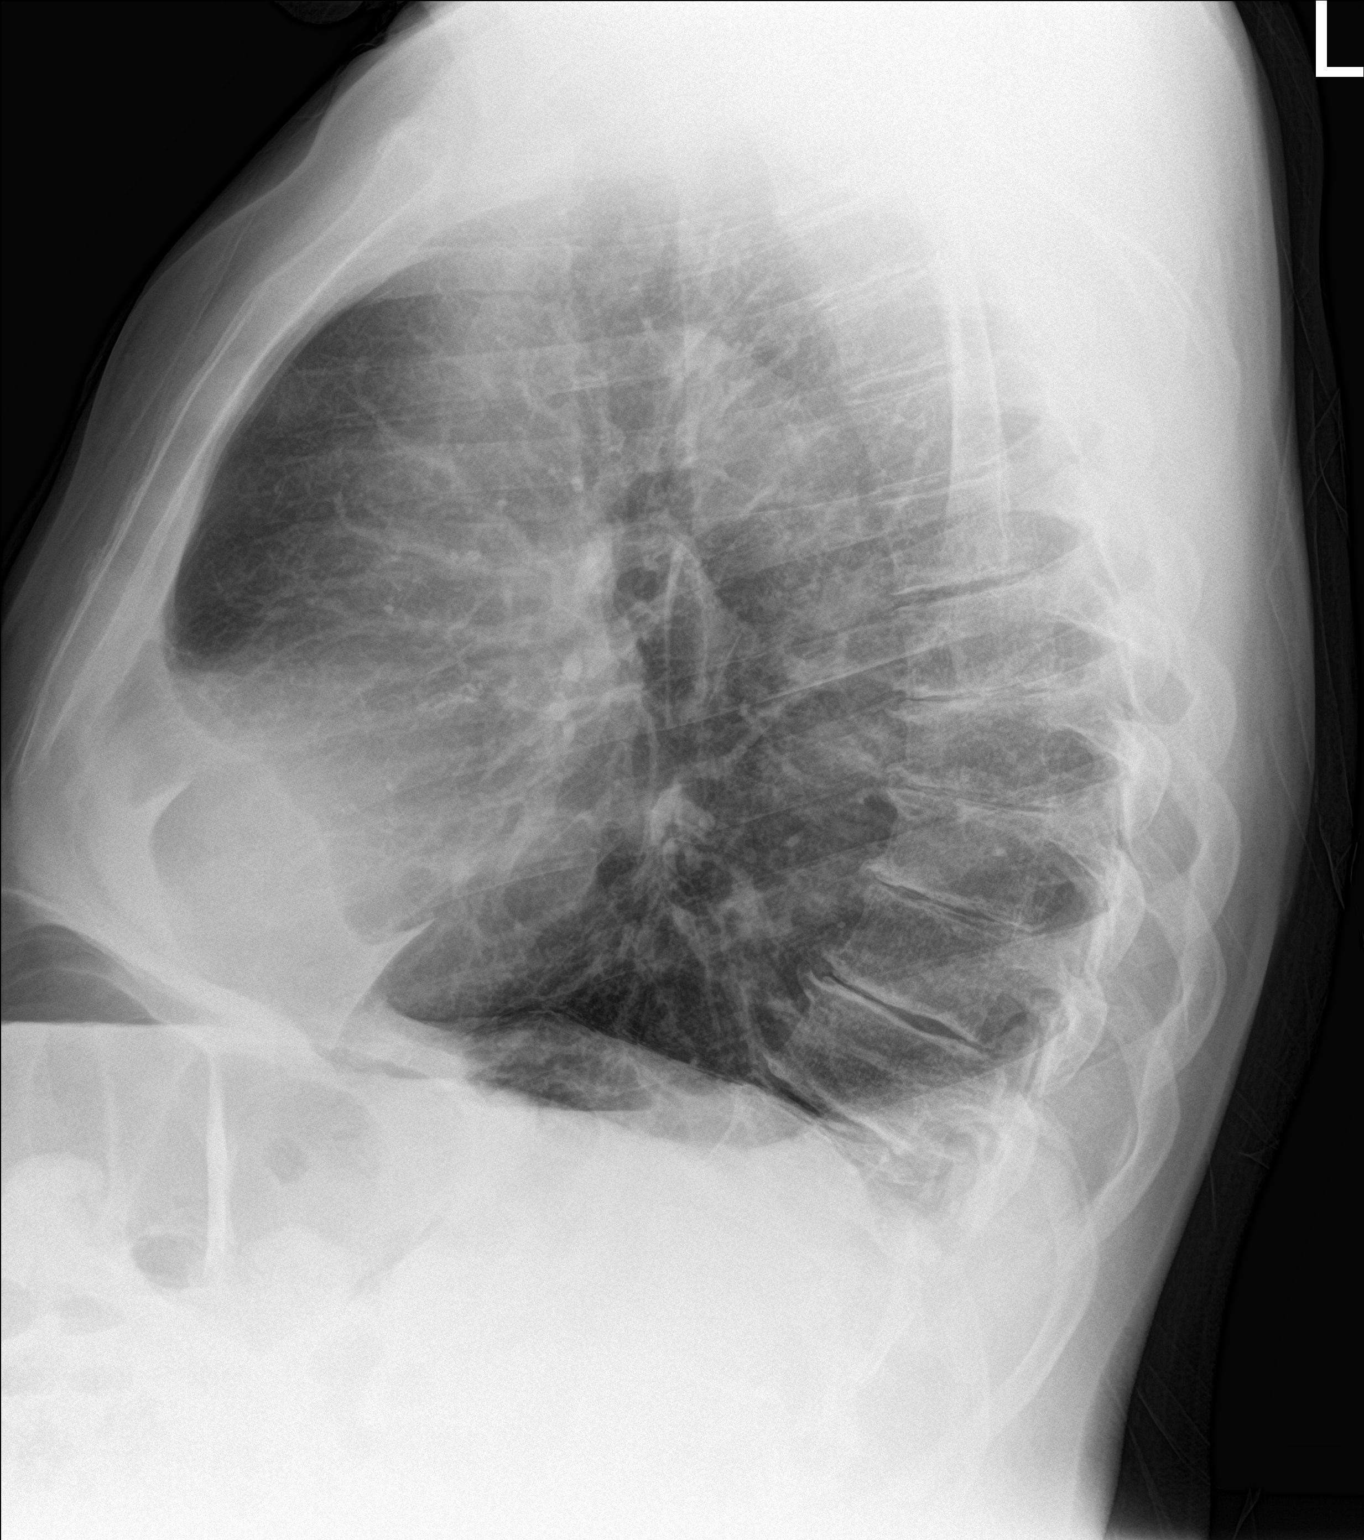

[2 of 2 positions shown; findings below may reference images not displayed]

FINDINGS: Cardiac shadow is within normal limits. Mild scarring is noted in
the bases bilaterally. No focal infiltrate or sizable effusion is
seen. No acute bony abnormality is noted.
IMPRESSION: No active cardiopulmonary disease.

## 2018-06-29 ENCOUNTER — Other Ambulatory Visit: Payer: Self-pay | Admitting: Internal Medicine

## 2018-07-13 ENCOUNTER — Other Ambulatory Visit: Payer: Self-pay | Admitting: Internal Medicine

## 2018-08-19 ENCOUNTER — Encounter: Payer: Self-pay | Admitting: Internal Medicine

## 2018-08-24 ENCOUNTER — Other Ambulatory Visit: Payer: Self-pay | Admitting: Internal Medicine

## 2018-09-16 ENCOUNTER — Encounter: Payer: Self-pay | Admitting: Internal Medicine

## 2018-09-20 ENCOUNTER — Other Ambulatory Visit: Payer: Self-pay

## 2018-09-20 ENCOUNTER — Ambulatory Visit (INDEPENDENT_AMBULATORY_CARE_PROVIDER_SITE_OTHER): Payer: Medicare Other | Admitting: Internal Medicine

## 2018-09-20 ENCOUNTER — Encounter: Payer: Self-pay | Admitting: Internal Medicine

## 2018-09-20 DIAGNOSIS — J4520 Mild intermittent asthma, uncomplicated: Secondary | ICD-10-CM | POA: Diagnosis not present

## 2018-09-20 DIAGNOSIS — G4733 Obstructive sleep apnea (adult) (pediatric): Secondary | ICD-10-CM

## 2018-09-20 NOTE — Patient Instructions (Signed)
We can continue CPAP auto 10-20, mask of choice, humidifier supplies, AirView/ card  We can continue current asthma management.  Plese call if we can help

## 2018-09-20 NOTE — Assessment & Plan Note (Signed)
Very good control. Has not interfered with scuba diving. Plan- continue current meds. Continue Covid precautions.

## 2018-09-20 NOTE — Progress Notes (Signed)
Subjective:    Patient ID: [name], male    DOB: 22-Apr-1950, 68 y.o.   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 FENO-12/26/2015- 30 (mildly elevated) ANCA- neg ANA + 1:80 Respiratory Allergy Profile 06/11/16--total IgE WNL 88, mild elevations for dust mite, cat, dog, aspergillus fumigatus, hickory tree pollen Office Spirometry 06/11/2016-WNL-FVC 3.95/103%, FEV1 2.71/95%, ratio 0.69, FEF 25-75% 1.60/ 69% Office Spirometry 06/11/17-minimal obstructive airways disease, small airways.  FVC 3.62/95%, FEV1 2.49/88%, ratio 1.69, FEF 25-75% 1.39/61% -----------------------------------------------------------------------------------  09/17/2017- 68 year old male never smoker followed for OSA with hypersomnia, history asthma, allergic rhinitis, complicated by HBP, RBBB -----OSA; DME: [facility] Pt wears CPAP nightly and DL attached. Travels with CPAP as well. Denies any issues with breathing at this time  Advair 100/50, Combivent respimat, Has been scuba diving with no breathing problems whatsoever except that he fell off a dock and cut his left lower leg requiring stitches.  Has been having bilateral pedal edema and has support hose.  CPAP doing very well with no problems.  He takes it on trips.  Download compliance 93% AHI 1.0/hour.  ROS-see HPI + = positive Constitutional:   No-   weight loss, night sweats, fevers, chills, fatigue, lassitude. HEENT:   No-  headaches, difficulty swallowing, tooth/dental problems, sore throat,       No-  sneezing, itching, ear ache, nasal congestion, post nasal drip,  CV:  No-   chest pain, orthopnea, PND, +swelling in lower extremities, anasarca, dizziness, palpitations Resp: No-   shortness of breath  with exertion or at rest.              No-   productive cough,  No non-productive cough,  No- coughing up of blood.              No-   change in color of mucus.  + wheezing.   Skin: No-   rash or lesions. GI:  No-   heartburn, indigestion, abdominal pain, nausea, vomiting,  GU: n. MS:  No-   joint pain or swelling.   Neuro-  +early Parkinson's Psych:  No- change in mood or affect. No depression or anxiety.  No memory loss.  OBJ- Physical Exam General- Alert, Oriented, Affect-appropriate, Distress- none acute,  Skin- rash-none, lesions- none, excoriation- none Lymphadenopathy- none Head- atraumatic            Eyes- Gross vision intact, PERRLA, conjunctivae and secretions clear            Ears- Hearing, canals-normal            Nose- Clear, + external dev, no- mucus, polyps, erosion, perforation             Throat- Mallampati II , mucosa clear , drainage- none, tonsils- atrophic Neck- flexible , trachea midline, no stridor , thyroid nl, carotid no bruit Chest - symmetrical excursion , unlabored           Heart/CV- RRR , no murmur , no gallop  , no rub, nl s1 s2                           - JVD- none , edema =1, stasis changes- none, varices- none  Lung- clear to P&A, wheeze- none, cough- none , dullness-none, rub- none           Chest wall-  Abd-  Br/ Gen/ Rectal- Not done, not indicated Extrem- cyanosis- none, clubbing, none, atrophy- none, strength- nl, + surgical scar L knee, +healing flap laceration L calf. Neuro-non-focal  09/20/2018- Virtual Visit via Telephone Note  I connected with [name] on 09/20/18 at  9:30 AM EDT by telephone and verified that I am speaking with the correct person using two identifiers.  Location: Patient: H Provider: O   I discussed the limitations, risks, security and privacy concerns of performing an evaluation and management service by telephone and the availability of in person appointments. I also discussed with the patient that  there may be a patient responsible charge related to this service. The patient expressed understanding and agreed to proceed.   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  Asthma doing well- few minor epsiodes blamed on allergy- feels well controlled w current meds. Using his exercycle to maintain stamina.  Very satisfied with CPAP. Discussed download.   Observations/Objective: Download 100% compliance, AHI 1.5/ hr  CXR 06/11/17- IMPRESSION: Small bilateral pleural effusions with mild atelectasis of both lung bases. No focal pneumonia or pulmonary edema.  Assessment and Plan: OSA- benefits from CPAP- Plan to continue auto 10-20 Asthma- Not diving currently. Well controlled  Follow Up Instructions:   1 year I discussed the assessment and treatment plan with the patient. The patient was provided an opportunity to ask questions and all were answered. The patient agreed with the plan and demonstrated an understanding of the instructions.   The patient was advised to call back or seek an in-person evaluation if the symptoms worsen or if the condition fails to improve as anticipated.  I provided 21 minutes of non-face-to-face time during this encounter.   Baird Lyons, MD

## 2018-09-20 NOTE — Assessment & Plan Note (Signed)
Doing very well. Comfortable with his CPAP, continues to benefit. Download confirms good compliance and control. Plan- continue auto 10-20

## 2018-10-03 ENCOUNTER — Other Ambulatory Visit: Payer: Self-pay | Admitting: Internal Medicine

## 2018-11-24 IMAGING — MR MR PROSTATE WO/W CM
23 of 54 series · 23 of 54 positions shown · IV contrast (yes)
Comparison: None.

CLINICAL DATA: Elevated PSA.

EXAM:
MR PROSTATE WITHOUT AND WITH CONTRAST
TECHNIQUE: Multiplanar multisequence MRI images were obtained of the pelvis
centered about the prostate. Pre and post contrast images were
obtained.
CONTRAST:  19mL MULTIHANCE GADOBENATE DIMEGLUMINE 529 MG/ML IV SOLN

[Series 5: bSSFP fat-sat · axial · 6.0mm · 0.86mm/px · 1 of 39 slices shown]
[im 1/39]
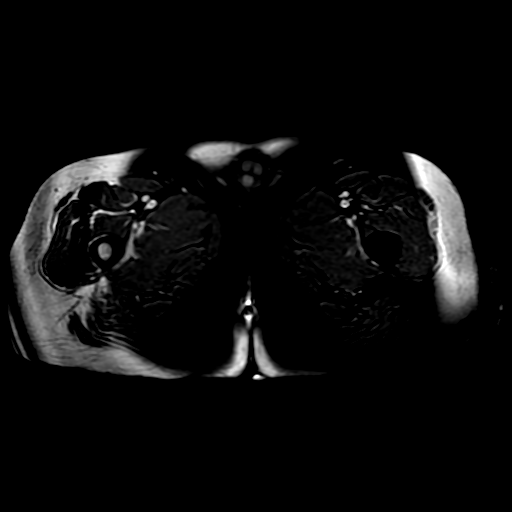

[Series 6: T1 · axial · 6.0mm · 0.86mm/px · 1 of 39 slices shown (1 of 2)]
[im 1/39]
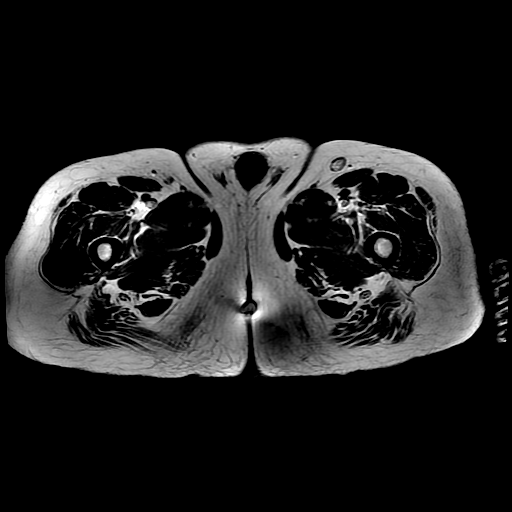

[Series 7: T2 · axial · 3.0mm · 0.29mm/px · 1 of 29 slices shown (1 of 4)]
[im 1/29]
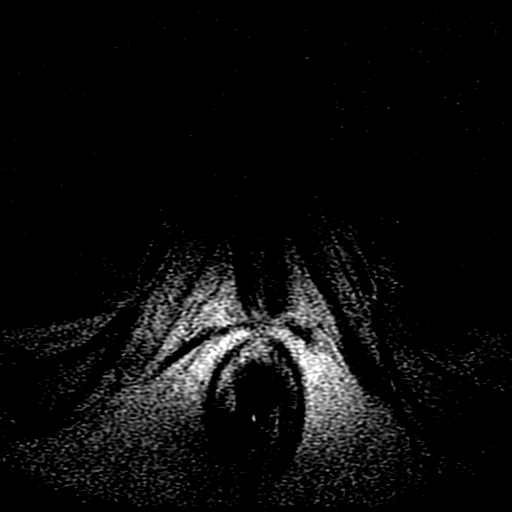

[Series 8: T1 · axial · 3.0mm · 0.29mm/px · 1 of 29 slices shown (2 of 2)]
[im 1/29]
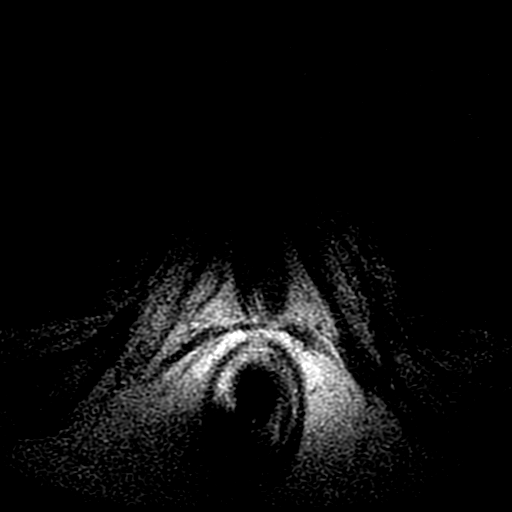

[Series 9: T2 · axial · 1.8mm · 0.47mm/px · 1 of 116 slices shown (2 of 4)]
[im 1/116]
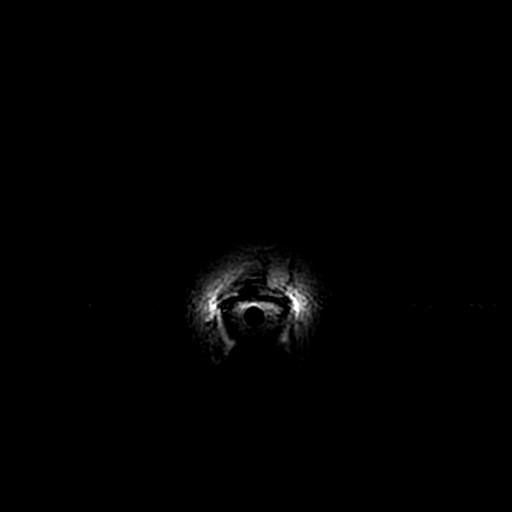

[Series 10: T2 · sagittal · 4.0mm · 0.29mm/px · 1 of 21 slices shown (3 of 4)]
[im 1/21]
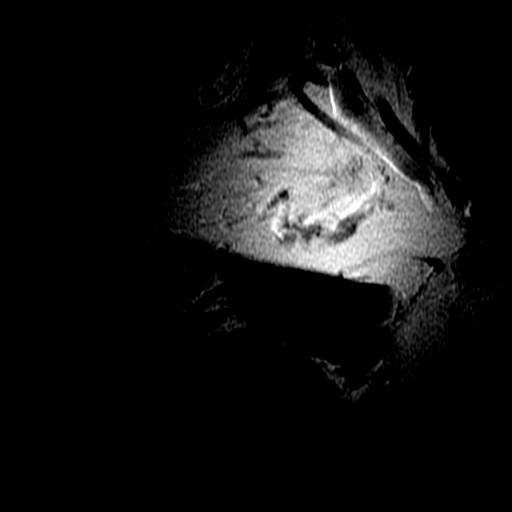

[Series 11: T2 · coronal · 4.0mm · 0.29mm/px · 1 of 17 slices shown (4 of 4)]
[im 1/17]
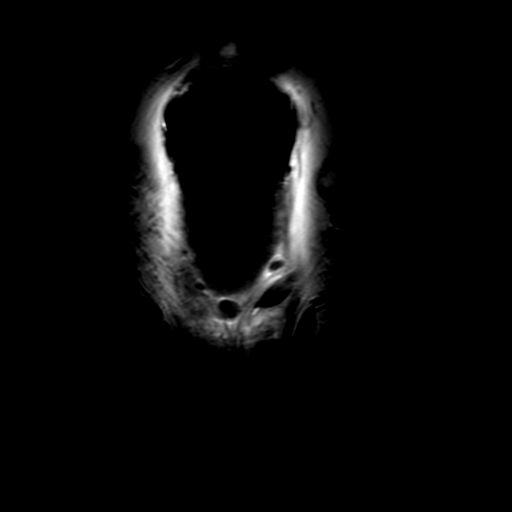

[Series 12: DWI · axial · 3.0mm · 0.59mm/px · 1 of 58 slices shown (1 of 6)]
[im 1/58]
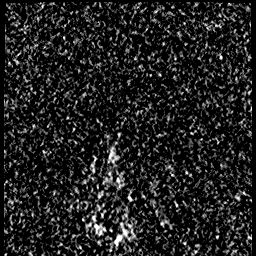

[Series 13: DWI · axial · 3.0mm · 0.59mm/px · 1 of 53 slices shown (2 of 6)]
[im 1/53]
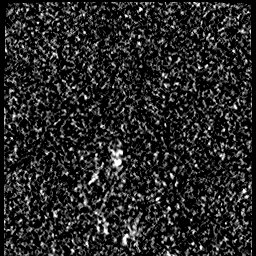

[Series 14: DWI · axial · 3.0mm · 0.59mm/px · 1 of 54 slices shown (3 of 6)]
[im 1/54]
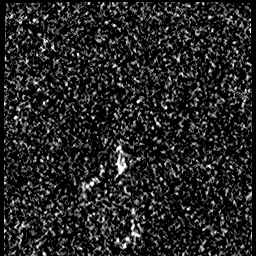

[Series 1200: DWI · axial · 3.0mm · 0.59mm/px · 1 of 27 slices shown (4 of 6)]
[im 1/27]
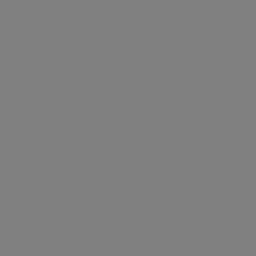

[Series 1300: DWI · axial · 3.0mm · 0.59mm/px · 1 of 29 slices shown (5 of 6)]
[im 1/29]
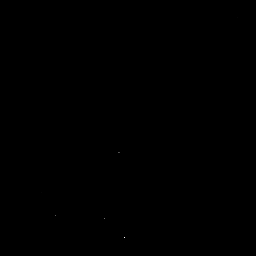

[Series 1400: DWI · axial · 3.0mm · 0.59mm/px · 1 of 29 slices shown (6 of 6)]
[im 1/29]
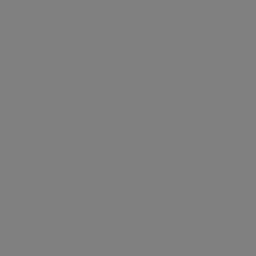

[((id)/(id)/1)-((id)/(id)/1) · axial · 3.0mm · 0.43mm/px · 1 of 70 slices shown (1 of 10)]
[im 1/70]
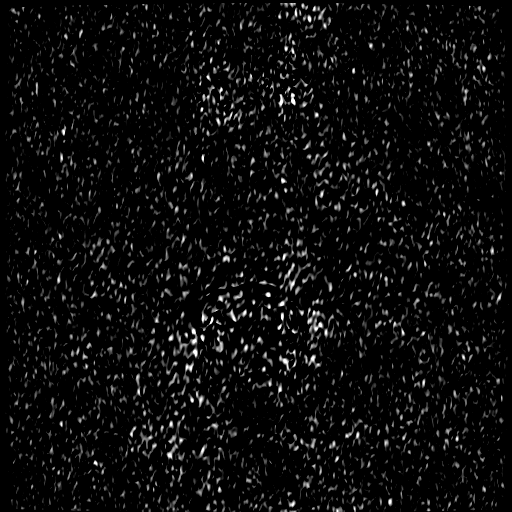

[((id)/(id)/1)-((id)/(id)/1) · axial · 3.0mm · 0.43mm/px · 1 of 72 slices shown (2 of 10)]
[im 1/72]
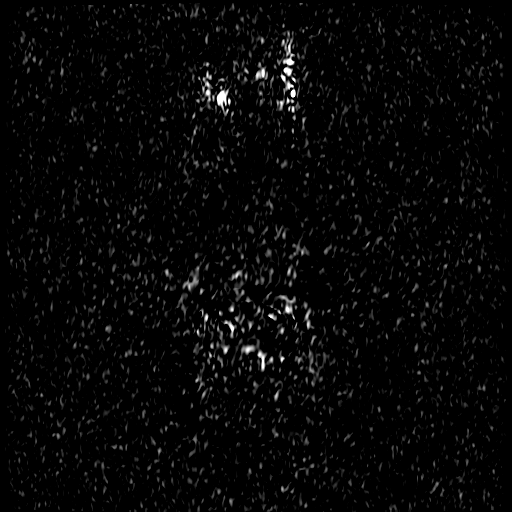

[((id)/(id)/1)-((id)/(id)/1) · axial · 3.0mm · 0.43mm/px · 1 of 72 slices shown (3 of 10)]
[im 1/72]
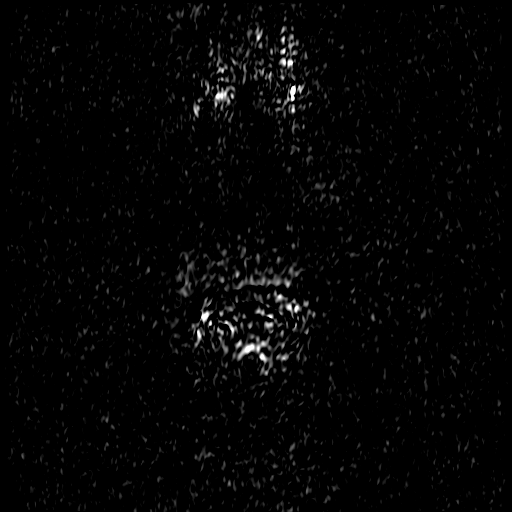

[((id)/(id)/1)-((id)/(id)/1) · axial · 3.0mm · 0.43mm/px · 1 of 72 slices shown (4 of 10)]
[im 1/72]
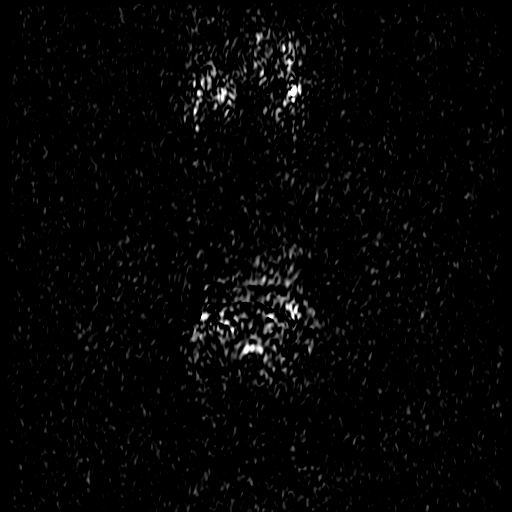

[((id)/(id)/1)-((id)/(id)/1) · axial · 3.0mm · 0.43mm/px · 1 of 72 slices shown (5 of 10)]
[im 1/72]
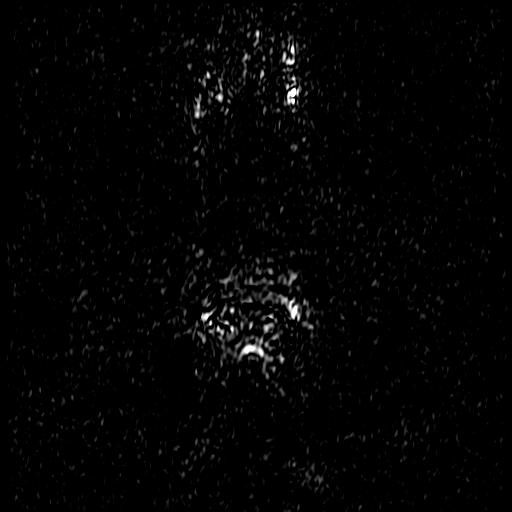

[((id)/(id)/1)-((id)/(id)/1) · axial · 3.0mm · 0.43mm/px · 1 of 72 slices shown (6 of 10)]
[im 1/72]
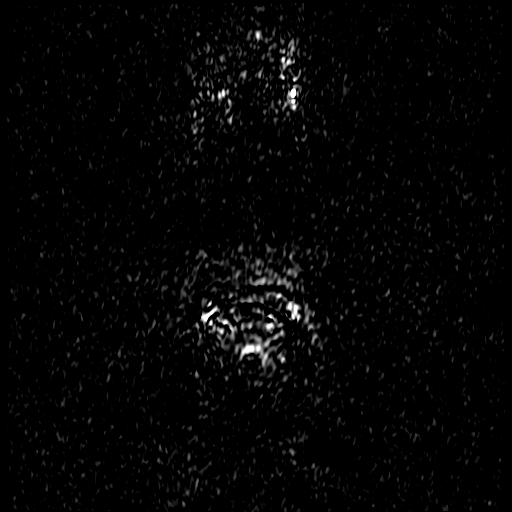

[((id)/(id)/1)-((id)/(id)/1) · axial · 3.0mm · 0.43mm/px · 1 of 72 slices shown (7 of 10)]
[im 1/72]
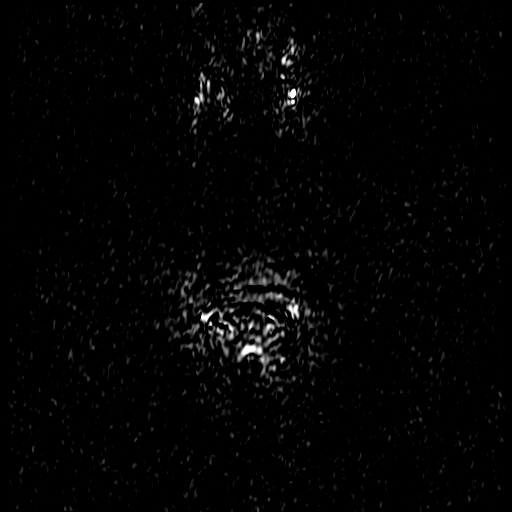

[((id)/(id)/1)-((id)/(id)/1) · axial · 3.0mm · 0.43mm/px · 1 of 72 slices shown (8 of 10)]
[im 1/72]
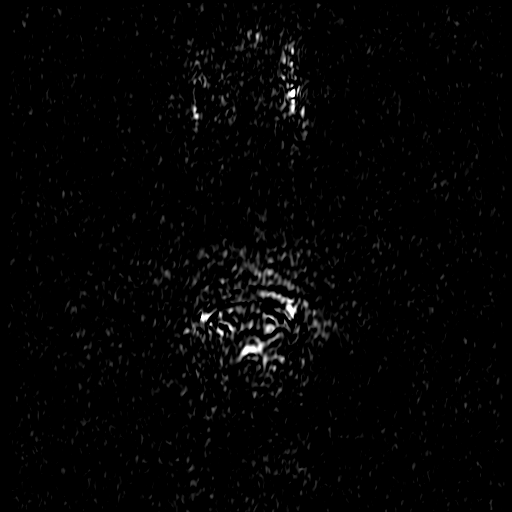

[((id)/(id)/1)-((id)/(id)/1) · axial · 3.0mm · 0.43mm/px · 1 of 72 slices shown (9 of 10)]
[im 1/72]
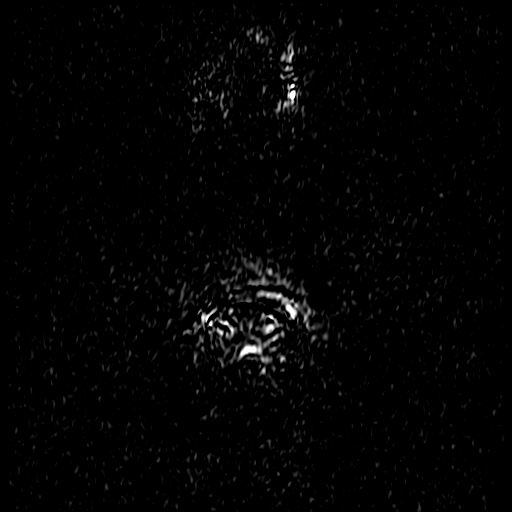

[((id)/(id)/1)-((id)/(id)/1) · axial · 3.0mm · 0.43mm/px · 1 of 72 slices shown (10 of 10)]
[im 1/72]
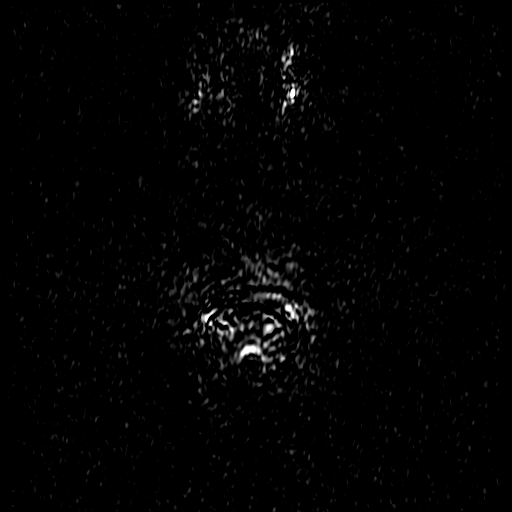

[23 of 54 positions shown; findings below may reference images not displayed]

FINDINGS: Prostate: There is asymmetry of the peripheral zone with the RIGHT
peripheral zone larger than the LEFT.

Within the mid gland RIGHT peripheral zone, well-circumscribed round
lesion measures 5 mm is hypointense on T2 weighted imaging (image
14, series 7). This lesion is hypointense on both the
diffusion-weighted imaging and ADC map which is not typical
carcinoma (typical high-grade carcinoma is bright on the
diffusion-weighted imaging and dark on ADC map). Lesion measures 4
mm on image 16, series 5822. No clear restricted diffusion within
the smaller LEFT peripheral zone. Stromal tissue noted at base.

The transitional zone has multiple well encapsulated nodules.

Volume: 4.1 x 5.5 x 4.2 cm (volume = 50 cm^3)

Transcapsular spread:  Absent

Seminal vesicle involvement: Absent

Neurovascular bundle involvement: Absent

Pelvic adenopathy: Absent

Bone metastasis: Abscess

Other findings: None
IMPRESSION: 1. No evidence of high-grade carcinoma.
2. Small round lesion in the mid gland of the RIGHT peripheral zone
(PI-RADS 2). Recommend attention on routine surveillance.
3. Asymmetry of the peripheral zone with the LEFT peripheral zone
small compared to the RIGHT.
4. Nodular transitional zone most consists with benign prostate
hypertrophy.

## 2018-11-30 NOTE — Telephone Encounter (Signed)
Ok  Please send in lexapro 10 mg disp 90 refill x 1  1 po qd  Wean as  Tolerated

## 2018-12-01 MED ORDER — ESCITALOPRAM OXALATE 10 MG PO TABS: 10.0000 mg | ORAL_TABLET | Freq: Every day | ORAL | 1 refills | Status: DC

## 2019-01-11 ENCOUNTER — Other Ambulatory Visit: Payer: Self-pay

## 2019-01-11 ENCOUNTER — Ambulatory Visit (INDEPENDENT_AMBULATORY_CARE_PROVIDER_SITE_OTHER): Payer: Medicare Other

## 2019-01-11 DIAGNOSIS — Z23 Encounter for immunization: Secondary | ICD-10-CM

## 2019-01-12 ENCOUNTER — Other Ambulatory Visit: Payer: Self-pay | Admitting: Internal Medicine

## 2019-01-14 ENCOUNTER — Telehealth (INDEPENDENT_AMBULATORY_CARE_PROVIDER_SITE_OTHER): Payer: Medicare Other | Admitting: Internal Medicine

## 2019-01-14 ENCOUNTER — Encounter: Payer: Self-pay | Admitting: Internal Medicine

## 2019-01-14 ENCOUNTER — Other Ambulatory Visit: Payer: Self-pay

## 2019-01-14 DIAGNOSIS — D696 Thrombocytopenia, unspecified: Secondary | ICD-10-CM

## 2019-01-14 DIAGNOSIS — G4733 Obstructive sleep apnea (adult) (pediatric): Secondary | ICD-10-CM | POA: Diagnosis not present

## 2019-01-14 DIAGNOSIS — R972 Elevated prostate specific antigen [PSA]: Secondary | ICD-10-CM

## 2019-01-14 DIAGNOSIS — T148XXA Other injury of unspecified body region, initial encounter: Secondary | ICD-10-CM

## 2019-01-14 DIAGNOSIS — Z79899 Other long term (current) drug therapy: Secondary | ICD-10-CM

## 2019-01-14 DIAGNOSIS — I10 Essential (primary) hypertension: Secondary | ICD-10-CM

## 2019-01-14 DIAGNOSIS — J45909 Unspecified asthma, uncomplicated: Secondary | ICD-10-CM

## 2019-01-14 MED ORDER — AMLODIPINE BESY-BENAZEPRIL HCL 5-10 MG PO CAPS: 1.0000 | ORAL_CAPSULE | Freq: Every day | ORAL | 1 refills | Status: DC

## 2019-01-14 NOTE — Progress Notes (Signed)
Virtual Visit via Video Note  I connected with@ on 01/14/19 at  2:00 PM EDT by a video enabled telemedicine application and verified that I am speaking with the correct person using two identifiers. Location patient: home Location provider:work  office Persons participating in the virtual visit: patient, provider  [place] national recommendations  regarding COVID 19 pandemic   video visit is advised over in office visit for this patient.  Patient aware  of the limitations of evaluation and management by telemedicine and  availability of in person appointments. and agreed to proceed.   HPI: [name] presents for video visit for medication evaluation. Hypertension has not checked his reading but needs a refill on his Lotrel.  No side effects noted. He retired in December since that time had been exercising until [employer] shutdown although he does have in his place of residence a road bike and exercise bike and just trying to get motivation to get back up his weight has gone back up to the 215 range.   He has no asthma flares chest pain shortness of breath. He continues to have easy bruising on the backs of his hands when he hits things accidentally but no other unusual bleeding. His on Lexapro 10 mg down from originally 30 mg.  He still sees Karin Golden who is not yet retired. He was on Vivactil for sleep but he ran out so was going to try without it.  He has obstructive sleep apnea sees Dr. Annamaria Boots. To see the urologist in a few months got delayed no new symptoms. He is up-to-date on his flu shot.    ROS: See pertinent positives and negatives per HPI.  See HPI trying to stay isolated fair of complications if he gets Covid.  But does exercise does get out some.  He is due for colonoscopy soon but has some hesitancy because of the Covid risk and does not have anyone to drive him home.  Past Medical History:  Diagnosis Date  . Arthritis    knees  . Asthma   . Depression   . Elevated  PSA    [place] and bx negative 2011  . Hypertension    nl cardiolite in 1998  . Pneumonia 2005   right hospitalized   . Rhinosinusitis   . Sleep apnea    wears CPAP    Past Surgical History:  Procedure Laterality Date  . FACIAL RECONSTRUCTION SURGERY    . NASAL SINUS SURGERY    . 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 shoulder atrhroscopic surgery     Right  . VENTRAL HERNIA REPAIR  03-2007   rerepair winter 2010    Family History  Problem Relation Age of Onset  . Heart failure Father        MV replacement  . Asthma Mother   . Asthma Unknown   . Hypertension Unknown   . Diabetes Maternal Grandmother   . Healthy Sister   . Healthy Brother   . Healthy Brother   . Colon cancer Neg Hx   . Pancreatic cancer Neg Hx   . Stomach cancer Neg Hx   . Esophageal cancer Neg Hx     Social History   Tobacco Use  . Smoking status: Never Smoker  . Smokeless tobacco: Never Used  Substance Use Topics  . Alcohol use: No    Comment: Quit drinking ETOH 1986  . Drug use: No      Current  Outpatient Medications:  .  amLODipine-benazepril (LOTREL) 5-10 MG capsule, Take 1 capsule by mouth daily. TAKE 1 CAPSULE BY MOUTH EVERY DAY, Disp: 90 capsule, Rfl: 1 .  azelastine (ASTELIN) 137 MCG/SPRAY nasal spray, USE AS DIRECTED, Disp: 30 mL, Rfl: 5 .  clobetasol (OLUX) 0.05 % topical foam, Apply topically as needed., Disp: , Rfl:  .  COMBIVENT RESPIMAT 20-100 MCG/ACT AERS respimat, INHALE 2 PUFFS UP TO FOUR TIMES A DAY AS NEEDED, Disp: 12 g, Rfl: 1 .  escitalopram (LEXAPRO) 10 MG tablet, Take 1 tablet (10 mg total) by mouth daily., Disp: 90 tablet, Rfl: 1 .  fexofenadine (ALLEGRA) 180 MG tablet, Take 180 mg by mouth daily.  , Disp: , Rfl:  .  ibuprofen (ADVIL,MOTRIN) 800 MG tablet, TAKE ONE TABLET BY MOUTH EVERY 8 HOURS AS NEEDED, Disp: 60 tablet, Rfl: 1 .  mometasone (NASONEX) 50 MCG/ACT nasal spray, USE 1 TO 2 SPRAYS IN EACH NOSTRIL DAILY, Disp: 17 g, Rfl: 3 .   silodosin (RAPAFLO) 8 MG CAPS capsule, Take 1 capsule (8 mg total) by mouth at bedtime., Disp: 30 capsule, Rfl: 11 .  triamcinolone cream (KENALOG) 0.1 %, Apply 1 application topically as needed., Disp: , Rfl:  .  WIXELA INHUB 100-50 MCG/DOSE AEPB, TAKE 1 PUFF BY MOUTH TWICE A DAY, Disp: 60 each, Rfl: 6  EXAM: BP Readings from Last 3 Encounters:  01/28/18 122/78  09/17/17 114/70  09/16/17 110/70    VITALS per patient if applicable:  Bp  Weight?  215    GENERAL: alert, oriented, appears well and in no acute distress  HEENT: atraumatic, conjunttiva clear, no obvious abnormalities on inspection of external nose and ears  NECK: normal movements of the head and neck  LUNGS: on inspection no signs of respiratory distress, breathing rate appears normal, no obvious gross SOB, gasping or wheezing  CV: no obvious cyanosis  MS: moves all visible extremities without noticeable abnormality Skin is clear although forearms back of hands looks like senile ecchymosis or easy bruising but no petechiae denies any anywhere in his body otherwise. PSYCH/NEURO: pleasant and cooperative, no obvious depression or anxiety, speech and thought processing grossly intact Lab Results  Component Value Date   WBC 7.2 01/28/2018   HGB 15.2 01/28/2018   HCT 43.7 01/28/2018   PLT 200.0 01/28/2018   GLUCOSE 108 (H) 01/28/2018   CHOL 162 01/28/2018   TRIG 47.0 01/28/2018   HDL 50.70 01/28/2018   LDLCALC 102 (H) 01/28/2018   ALT 25 01/28/2018   AST 25 01/28/2018   NA 134 (L) 01/28/2018   K 4.7 01/28/2018   CL 99 01/28/2018   CREATININE 0.79 01/28/2018   BUN 10 01/28/2018   CO2 30 01/28/2018   TSH 3.64 01/28/2018   PSA 5.82 02/17/2018   INR 1.0 01/28/2018    ASSESSMENT AND PLAN:  Discussed the following assessment and plan:    ICD-10-CM   1. Medication management  123456 Basic metabolic panel    CBC with Differential/Platelet    Hemoglobin A1c    Hepatic function panel    Lipid panel    TSH  2.  Essential hypertension  99991111 Basic metabolic panel    CBC with Differential/Platelet    Hemoglobin A1c    Hepatic function panel    Lipid panel    TSH  3. Thrombocytopenia (HCC)  99991111 Basic metabolic panel    CBC with Differential/Platelet    Hemoglobin A1c    Hepatic function panel    Lipid panel  TSH  4. Obstructive sleep apnea  [id number] Basic metabolic panel    CBC with Differential/Platelet    Hemoglobin A1c    Hepatic function panel    Lipid panel    TSH  5. Asthma, chronic, unspecified asthma severity, uncomplicated  [id number] Basic metabolic panel    CBC with Differential/Platelet    Hemoglobin A1c    Hepatic function panel    Lipid panel    TSH  6. Bruising  T14.8XXA Basic metabolic panel    CBC with Differential/Platelet    Hemoglobin A1c    Hepatic function panel    Lipid panel    TSH   seems benign and age med realted no bleeding plan lab   7. Elevated PSA  R97.20 PSA   will get lab for urologist as due   lasa ov 10 19  Under care OSA  Plan lab and then cpx in next months  Counseled.   Expectant management and discussion of plan and treatment with opportunity to ask questions and all were answered. The patient agreed with the plan and demonstrated an understanding of the instructions.   Advised to call back or seek an in-person evaluation if worsening  or having  further concerns .   Shanon Ace, MD

## 2019-02-15 ENCOUNTER — Other Ambulatory Visit: Payer: Self-pay | Admitting: Internal Medicine

## 2019-03-13 ENCOUNTER — Other Ambulatory Visit: Payer: Self-pay | Admitting: Internal Medicine

## 2019-03-21 ENCOUNTER — Other Ambulatory Visit: Payer: Self-pay | Admitting: Internal Medicine

## 2019-04-06 ENCOUNTER — Telehealth: Payer: Self-pay | Admitting: Internal Medicine

## 2019-04-06 NOTE — Telephone Encounter (Signed)
Spoke with patient. He was requesting a refill on his Astelin nasal spray. He stated that he does not use it often. The bottle he currently has is expired from many years ago. Did advise him that it looks like a refill hasn't been sent in since 2012 therefore I would need to ask Dr. Annamaria Boots for permission. He verbalized understanding.   Pharmacy is [facility] on [address]   Dr. Annamaria Boots, please advise if you are ok with sending in this refill. Thanks!

## 2019-04-07 MED ORDER — AZELASTINE HCL 0.1 % NA SOLN: 1.0000 | Freq: Two times a day (BID) | NASAL | 12 refills | Status: DC | PRN

## 2019-04-07 NOTE — Telephone Encounter (Signed)
Ok to refill Astelin nasal spray, # 1    1-2 puffs each nostril twice daily as needed  ref x 12

## 2019-04-07 NOTE — Telephone Encounter (Signed)
I have sent the refill for astelin  Pt aware  Nothing further needed

## 2019-04-18 DIAGNOSIS — F411 Generalized anxiety disorder: Secondary | ICD-10-CM | POA: Diagnosis not present

## 2019-04-29 ENCOUNTER — Ambulatory Visit: Payer: Medicare Other

## 2019-05-05 ENCOUNTER — Encounter: Payer: Self-pay | Admitting: Internal Medicine

## 2019-05-07 ENCOUNTER — Ambulatory Visit: Payer: Medicare Other

## 2019-05-16 ENCOUNTER — Ambulatory Visit: Payer: Medicare Other

## 2019-05-16 DIAGNOSIS — F411 Generalized anxiety disorder: Secondary | ICD-10-CM | POA: Diagnosis not present

## 2019-05-24 ENCOUNTER — Other Ambulatory Visit: Payer: Self-pay | Admitting: Internal Medicine

## 2019-05-30 MED ORDER — MOMETASONE FUROATE 50 MCG/ACT NA SUSP: NASAL | 3 refills | Status: DC

## 2019-06-10 ENCOUNTER — Ambulatory Visit (AMBULATORY_SURGERY_CENTER): Payer: Self-pay | Admitting: *Deleted

## 2019-06-10 ENCOUNTER — Other Ambulatory Visit: Payer: Self-pay

## 2019-06-10 VITALS — Temp 97.7°F | Ht 65.0 in | Wt 223.0 lb

## 2019-06-10 DIAGNOSIS — Z86010 Personal history of colonic polyps: Secondary | ICD-10-CM

## 2019-06-10 MED ORDER — NA SULFATE-K SULFATE-MG SULF 17.5-3.13-1.6 GM/177ML PO SOLN: ORAL | 0 refills | Status: DC

## 2019-06-10 NOTE — Progress Notes (Signed)
Patient is here in-person for PV. patient is allergic to EGGS!! Patient denies any allergies to soy. Patient denies any problems with anesthesia/sedation. Patient denies any oxygen use at home. Patient denies taking any diet/weight loss medications or blood thinners. Patient is not being treated for MRSA or C-diff. EMMI education assisgned to the patient for the procedure, this was explained and instructions given to patient. COVID-19 screening test is not needed, pt has had both vaccines, 2nd dose on 05/31/2019 per chart.  Patient is aware of our care-partner policy and 0000000 safety regulations.

## 2019-06-12 ENCOUNTER — Other Ambulatory Visit: Payer: Self-pay | Admitting: Internal Medicine

## 2019-06-13 DIAGNOSIS — F411 Generalized anxiety disorder: Secondary | ICD-10-CM | POA: Diagnosis not present

## 2019-06-14 DIAGNOSIS — L2089 Other atopic dermatitis: Secondary | ICD-10-CM | POA: Diagnosis not present

## 2019-06-14 DIAGNOSIS — L209 Atopic dermatitis, unspecified: Secondary | ICD-10-CM | POA: Diagnosis not present

## 2019-06-14 DIAGNOSIS — D692 Other nonthrombocytopenic purpura: Secondary | ICD-10-CM | POA: Diagnosis not present

## 2019-06-14 DIAGNOSIS — Z85828 Personal history of other malignant neoplasm of skin: Secondary | ICD-10-CM | POA: Diagnosis not present

## 2019-06-24 ENCOUNTER — Ambulatory Visit (AMBULATORY_SURGERY_CENTER): Payer: Medicare PPO | Admitting: Internal Medicine

## 2019-06-24 ENCOUNTER — Encounter: Payer: Self-pay | Admitting: Internal Medicine

## 2019-06-24 ENCOUNTER — Other Ambulatory Visit: Payer: Self-pay

## 2019-06-24 VITALS — BP 147/84 | HR 63 | Temp 96.2°F | Resp 16 | Ht 65.0 in | Wt 223.0 lb

## 2019-06-24 DIAGNOSIS — D122 Benign neoplasm of ascending colon: Secondary | ICD-10-CM | POA: Diagnosis not present

## 2019-06-24 DIAGNOSIS — I10 Essential (primary) hypertension: Secondary | ICD-10-CM | POA: Diagnosis not present

## 2019-06-24 DIAGNOSIS — D120 Benign neoplasm of cecum: Secondary | ICD-10-CM

## 2019-06-24 DIAGNOSIS — D123 Benign neoplasm of transverse colon: Secondary | ICD-10-CM

## 2019-06-24 DIAGNOSIS — Z86010 Personal history of colonic polyps: Secondary | ICD-10-CM

## 2019-06-24 DIAGNOSIS — G4733 Obstructive sleep apnea (adult) (pediatric): Secondary | ICD-10-CM | POA: Diagnosis not present

## 2019-06-24 MED ORDER — SODIUM CHLORIDE 0.9 % IV SOLN: 500.0000 mL | Freq: Once | INTRAVENOUS | Status: DC

## 2019-06-24 NOTE — Op Note (Signed)
[facility] Patient Name: [name] Procedure Date: 06/24/2019 10:13 AM MRN: [id number] Endoscopist: Jerene Bears , MD Age: 69 Referring MD:  Date of Birth: 1951-02-28 Gender: Male Account #: [id number] Procedure:                Colonoscopy Indications:              High risk colon cancer surveillance: Personal                            history of non-advanced adenoma, Last colonoscopy:                            December 2014 Medicines:                Monitored Anesthesia Care Procedure:                Pre-Anesthesia Assessment:                           - Prior to the procedure, a History and Physical                            was performed, and patient medications and                            allergies were reviewed. The patient's tolerance of                            previous anesthesia was also reviewed. The risks                            and benefits of the procedure and the sedation                            options and risks were discussed with the patient.                            All questions were answered, and informed consent                            was obtained. Prior Anticoagulants: The patient has                            taken no previous anticoagulant or antiplatelet                            agents. ASA Grade Assessment: II - A patient with                            mild systemic disease. After reviewing the risks                            and benefits, the patient was deemed in  satisfactory condition to undergo the procedure.                           After obtaining informed consent, the colonoscope                            was passed under direct vision. Throughout the                            procedure, the patient's blood pressure, pulse, and                            oxygen saturations were monitored continuously. The                            Colonoscope was introduced through the anus and                          advanced to the cecum, identified by appendiceal                            orifice and ileocecal valve. The patient tolerated                            the procedure well. The colonoscopy was performed                            with difficulty due to a redundant colon and the                            patient's body habitus. Successful completion of                            the procedure was aided by applying abdominal                            pressure. The quality of the bowel preparation was                            good (2 day). The ileocecal valve, appendiceal                            orifice, and rectum were photographed. Scope In: 10:38:09 AM Scope Out: 11:15:29 AM Scope Withdrawal Time: 0 hours 30 minutes 39 seconds  Total Procedure Duration: 0 hours 37 minutes 20 seconds  Findings:                 The digital rectal exam was normal.                           A 4 mm polyp was found in the cecum. The polyp was                            sessile. The polyp was removed with a  cold biopsy                            forceps. Resection and retrieval were complete.                           A 4 mm polyp was found in the ascending colon. The                            polyp was sessile. The polyp was removed with a                            cold biopsy forceps. Resection and retrieval were                            complete.                           A 9 mm polyp was found in the transverse colon. The                            polyp was sessile. The polyp was removed with a                            cold snare. Resection and retrieval were complete.                           A 5 mm polyp was found in the transverse colon. The                            polyp was sessile. The polyp was removed with a                            cold snare. Resection and retrieval were complete.                           Internal hemorrhoids were found during                             retroflexion. The hemorrhoids were small. Complications:            No immediate complications. Estimated Blood Loss:     Estimated blood loss was minimal. Impression:               - One 4 mm polyp in the cecum, removed with a cold                            biopsy forceps. Resected and retrieved.                           - One 4 mm polyp in the ascending colon, removed                            with a cold  biopsy forceps. Resected and retrieved.                           - One 9 mm polyp in the transverse colon, removed                            with a cold snare. Resected and retrieved.                           - One 5 mm polyp in the transverse colon, removed                            with a cold snare. Resected and retrieved.                           - Internal hemorrhoids. Recommendation:           - Patient has a contact number available for                            emergencies. The signs and symptoms of potential                            delayed complications were discussed with the                            patient. Return to normal activities tomorrow.                            Written discharge instructions were provided to the                            patient.                           - Resume previous diet.                           - Continue present medications.                           - Await pathology results.                           - Repeat colonoscopy is recommended for                            surveillance. The colonoscopy date will be                            determined after pathology results from today's                            exam become available for review. Jerene Bears, MD 06/24/2019 11:22:42 AM This report has been signed electronically.

## 2019-06-24 NOTE — Progress Notes (Signed)
Report to PACU, RN, vss, BBS= Clear.  

## 2019-06-24 NOTE — Progress Notes (Signed)
Pt. Reports no change in his medical or surgical history since his pre-visit 06/10/2019.

## 2019-06-24 NOTE — Patient Instructions (Signed)
Please read handouts provided. Continue present medications. Await pathology results.   YOU HAD AN ENDOSCOPIC PROCEDURE TODAY AT THE [facility]:   Refer to the procedure report that was given to you for any specific questions about what was found during the examination.  If the procedure report does not answer your questions, please call your gastroenterologist to clarify.  If you requested that your care partner not be given the details of your procedure findings, then the procedure report has been included in a sealed envelope for you to review at your convenience later.  YOU SHOULD EXPECT: Some feelings of bloating in the abdomen. Passage of more gas than usual.  Walking can help get rid of the air that was put into your GI tract during the procedure and reduce the bloating. If you had a lower endoscopy (such as a colonoscopy or flexible sigmoidoscopy) you may notice spotting of blood in your stool or on the toilet paper. If you underwent a bowel prep for your procedure, you may not have a normal bowel movement for a few days.  Please Note:  You might notice some irritation and congestion in your nose or some drainage.  This is from the oxygen used during your procedure.  There is no need for concern and it should clear up in a day or so.  SYMPTOMS TO REPORT IMMEDIATELY:  Following lower endoscopy (colonoscopy or flexible sigmoidoscopy):  Excessive amounts of blood in the stool  Significant tenderness or worsening of abdominal pains  Swelling of the abdomen that is new, acute  Fever of 100F or higher   For urgent or emergent issues, a gastroenterologist can be reached at any hour by calling [phone]. Do not use MyChart messaging for urgent concerns.    DIET:  We do recommend a small meal at first, but then you may proceed to your regular diet.  Drink plenty of fluids but you should avoid alcoholic beverages for 24 hours.  ACTIVITY:  You should plan to take it easy  for the rest of today and you should NOT DRIVE or use heavy machinery until tomorrow (because of the sedation medicines used during the test).    FOLLOW UP: Our staff will call the number listed on your records 48-72 hours following your procedure to check on you and address any questions or concerns that you may have regarding the information given to you following your procedure. If we do not reach you, we will leave a message.  We will attempt to reach you two times.  During this call, we will ask if you have developed any symptoms of COVID 19. If you develop any symptoms (ie: fever, flu-like symptoms, shortness of breath, cough etc.) before then, please call [phone].  If you test positive for Covid 19 in the 2 weeks post procedure, please call and report this information to us.    If any biopsies were taken you will be contacted by phone or by letter within the next 1-3 weeks.  Please call us at [phone] if you have not heard about the biopsies in 3 weeks.    SIGNATURES/CONFIDENTIALITY: You and/or your care partner have signed paperwork which will be entered into your electronic medical record.  These signatures attest to the fact that that the information above on your After Visit Summary has been reviewed and is understood.  Full responsibility of the confidentiality of this discharge information lies with you and/or your care-partner.  

## 2019-06-24 NOTE — Progress Notes (Signed)
Called to room to assist during endoscopic procedure.  Patient ID and intended procedure confirmed with present staff. Received instructions for my participation in the procedure from the performing physician.  

## 2019-06-27 NOTE — Telephone Encounter (Signed)
noted 

## 2019-06-28 ENCOUNTER — Telehealth: Payer: Self-pay | Admitting: *Deleted

## 2019-06-28 ENCOUNTER — Telehealth: Payer: Self-pay | Admitting: Internal Medicine

## 2019-06-28 NOTE — Telephone Encounter (Signed)
Second attempt, left VM.  

## 2019-06-28 NOTE — Telephone Encounter (Signed)
Message left

## 2019-06-28 NOTE — Telephone Encounter (Signed)
Patient returned call said he is doing well no questions at this time.

## 2019-06-29 ENCOUNTER — Other Ambulatory Visit: Payer: Self-pay

## 2019-06-30 ENCOUNTER — Other Ambulatory Visit (INDEPENDENT_AMBULATORY_CARE_PROVIDER_SITE_OTHER): Payer: Medicare PPO

## 2019-06-30 DIAGNOSIS — G4733 Obstructive sleep apnea (adult) (pediatric): Secondary | ICD-10-CM | POA: Diagnosis not present

## 2019-06-30 DIAGNOSIS — T148XXA Other injury of unspecified body region, initial encounter: Secondary | ICD-10-CM | POA: Diagnosis not present

## 2019-06-30 DIAGNOSIS — J45909 Unspecified asthma, uncomplicated: Secondary | ICD-10-CM | POA: Diagnosis not present

## 2019-06-30 DIAGNOSIS — D696 Thrombocytopenia, unspecified: Secondary | ICD-10-CM

## 2019-06-30 DIAGNOSIS — Z79899 Other long term (current) drug therapy: Secondary | ICD-10-CM

## 2019-06-30 DIAGNOSIS — I10 Essential (primary) hypertension: Secondary | ICD-10-CM | POA: Diagnosis not present

## 2019-06-30 DIAGNOSIS — R972 Elevated prostate specific antigen [PSA]: Secondary | ICD-10-CM

## 2019-06-30 LAB — TSH: TSH: 2.72 u[IU]/mL (ref 0.35–4.50)

## 2019-06-30 LAB — HEPATIC FUNCTION PANEL
ALT: 17 U/L (ref 0–53)
AST: 16 U/L (ref 0–37)
Albumin: 4.2 g/dL (ref 3.5–5.2)
Alkaline Phosphatase: 55 U/L (ref 39–117)
Bilirubin, Direct: 0.2 mg/dL (ref 0.0–0.3)
Total Bilirubin: 0.8 mg/dL (ref 0.2–1.2)
Total Protein: 6.1 g/dL (ref 6.0–8.3)

## 2019-06-30 LAB — LIPID PANEL
Cholesterol: 166 mg/dL (ref 0–200)
HDL: 41.9 mg/dL (ref >39.00)
LDL Cholesterol: 100 mg/dL — ABNORMAL HIGH (ref 0–99)
NonHDL: 124.5
Total CHOL/HDL Ratio: 4
Triglycerides: 124 mg/dL (ref 0.0–149.0)
VLDL: 24.8 mg/dL (ref 0.0–40.0)

## 2019-06-30 LAB — BASIC METABOLIC PANEL
BUN: 9 mg/dL (ref 6–23)
CO2: 28 mEq/L (ref 19–32)
Calcium: 8.8 mg/dL (ref 8.4–10.5)
Chloride: 100 mEq/L (ref 96–112)
Creatinine, Ser: 0.71 mg/dL (ref 0.40–1.50)
GFR: 110.41 mL/min (ref >60.00)
Glucose, Bld: 85 mg/dL (ref 70–99)
Potassium: 4.2 mEq/L (ref 3.5–5.1)
Sodium: 134 mEq/L — ABNORMAL LOW (ref 135–145)

## 2019-06-30 LAB — CBC WITH DIFFERENTIAL/PLATELET
Basophils Absolute: 0 10*3/uL (ref 0.0–0.1)
Basophils Relative: 0.5 % (ref 0.0–3.0)
Eosinophils Absolute: 0.5 10*3/uL (ref 0.0–0.7)
Eosinophils Relative: 6.6 % — ABNORMAL HIGH (ref 0.0–5.0)
HCT: 38.8 % — ABNORMAL LOW (ref 39.0–52.0)
Hemoglobin: 13.7 g/dL (ref 13.0–17.0)
Lymphocytes Relative: 10.4 % — ABNORMAL LOW (ref 12.0–46.0)
Lymphs Abs: 0.8 10*3/uL (ref 0.7–4.0)
MCHC: 35.2 g/dL (ref 30.0–36.0)
MCV: 104 fl — ABNORMAL HIGH (ref 78.0–100.0)
Monocytes Absolute: 1 10*3/uL (ref 0.1–1.0)
Monocytes Relative: 13.8 % — ABNORMAL HIGH (ref 3.0–12.0)
Neutro Abs: 5 10*3/uL (ref 1.4–7.7)
Neutrophils Relative %: 68.7 % (ref 43.0–77.0)
Platelets: 160 10*3/uL (ref 150.0–400.0)
RBC: 3.73 Mil/uL — ABNORMAL LOW (ref 4.22–5.81)
RDW: 14.7 % (ref 11.5–15.5)
WBC: 7.3 10*3/uL (ref 4.0–10.5)

## 2019-06-30 LAB — HEMOGLOBIN A1C: Hgb A1c MFr Bld: 5.5 % (ref 4.6–6.5)

## 2019-06-30 LAB — PSA: PSA: 6.08 ng/mL — ABNORMAL HIGH (ref 0.10–4.00)

## 2019-07-04 NOTE — Progress Notes (Signed)
Will review at upcoming appt  PSa up the some Blood count  slightly off  No blood sugar is very good

## 2019-07-06 ENCOUNTER — Encounter: Payer: Self-pay | Admitting: Internal Medicine

## 2019-07-06 DIAGNOSIS — R972 Elevated prostate specific antigen [PSA]: Secondary | ICD-10-CM | POA: Diagnosis not present

## 2019-07-07 ENCOUNTER — Other Ambulatory Visit: Payer: Self-pay

## 2019-07-08 ENCOUNTER — Other Ambulatory Visit: Payer: Self-pay

## 2019-07-08 ENCOUNTER — Encounter: Payer: Self-pay | Admitting: Internal Medicine

## 2019-07-08 ENCOUNTER — Ambulatory Visit (INDEPENDENT_AMBULATORY_CARE_PROVIDER_SITE_OTHER): Payer: Medicare PPO | Admitting: Internal Medicine

## 2019-07-08 VITALS — BP 138/82 | HR 70 | Temp 97.7°F | Ht 63.25 in | Wt 219.6 lb

## 2019-07-08 DIAGNOSIS — D7589 Other specified diseases of blood and blood-forming organs: Secondary | ICD-10-CM | POA: Diagnosis not present

## 2019-07-08 DIAGNOSIS — Z0000 Encounter for general adult medical examination without abnormal findings: Secondary | ICD-10-CM

## 2019-07-08 DIAGNOSIS — Z79899 Other long term (current) drug therapy: Secondary | ICD-10-CM | POA: Diagnosis not present

## 2019-07-08 DIAGNOSIS — G4733 Obstructive sleep apnea (adult) (pediatric): Secondary | ICD-10-CM | POA: Diagnosis not present

## 2019-07-08 DIAGNOSIS — J45909 Unspecified asthma, uncomplicated: Secondary | ICD-10-CM

## 2019-07-08 DIAGNOSIS — I10 Essential (primary) hypertension: Secondary | ICD-10-CM

## 2019-07-08 DIAGNOSIS — R233 Spontaneous ecchymoses: Secondary | ICD-10-CM | POA: Diagnosis not present

## 2019-07-08 NOTE — Patient Instructions (Addendum)
Agree with increae in activitry healthy eating.  Skin bleeding  Protection Pland recheck cbc diff and b12 folate in  4 months  And then as indicated    Health Maintenance, Male Adopting a healthy lifestyle and getting preventive care are important in promoting health and wellness. Ask your health care provider about:  The right schedule for you to have regular tests and exams.  Things you can do on your own to prevent diseases and keep yourself healthy. What should I know about diet, weight, and exercise? Eat a healthy diet   Eat a diet that includes plenty of vegetables, fruits, low-fat dairy products, and lean protein.  Do not eat a lot of foods that are high in solid fats, added sugars, or sodium. Maintain a healthy weight Body mass index (BMI) is a measurement that can be used to identify possible weight problems. It estimates body fat based on height and weight. Your health care provider can help determine your BMI and help you achieve or maintain a healthy weight. Get regular exercise Get regular exercise. This is one of the most important things you can do for your health. Most adults should:  Exercise for at least 150 minutes each week. The exercise should increase your heart rate and make you sweat (moderate-intensity exercise).  Do strengthening exercises at least twice a week. This is in addition to the moderate-intensity exercise.  Spend less time sitting. Even light physical activity can be beneficial. Watch cholesterol and blood lipids Have your blood tested for lipids and cholesterol at 68 years of age, then have this test every 5 years. You may need to have your cholesterol levels checked more often if:  Your lipid or cholesterol levels are high.  You are older than 68 years of age.  You are at high risk for heart disease. What should I know about cancer screening? Many types of cancers can be detected early and may often be prevented. Depending on your health  history and family history, you may need to have cancer screening at various ages. This may include screening for:  Colorectal cancer.  Prostate cancer.  Skin cancer.  Lung cancer. What should I know about heart disease, diabetes, and high blood pressure? Blood pressure and heart disease  High blood pressure causes heart disease and increases the risk of stroke. This is more likely to develop in people who have high blood pressure readings, are of African descent, or are overweight.  Talk with your health care provider about your target blood pressure readings.  Have your blood pressure checked: ? Every 3-5 years if you are 72-69 years of age. ? Every year if you are 43 years old or older.  If you are between the ages of 59 and 11 and are a current or former smoker, ask your health care provider if you should have a one-time screening for abdominal aortic aneurysm (AAA). Diabetes Have regular diabetes screenings. This checks your fasting blood sugar level. Have the screening done:  Once every three years after age 62 if you are at a normal weight and have a low risk for diabetes.  More often and at a younger age if you are overweight or have a high risk for diabetes. What should I know about preventing infection? Hepatitis B If you have a higher risk for hepatitis B, you should be screened for this virus. Talk with your health care provider to find out if you are at risk for hepatitis B infection. Hepatitis C Blood  testing is recommended for:  Everyone born from [birth year] through 1965.  Anyone with known risk factors for hepatitis C. Sexually transmitted infections (STIs)  You should be screened each year for STIs, including gonorrhea and chlamydia, if: ? You are sexually active and are younger than 68 years of age. ? You are older than 68 years of age and your health care provider tells you that you are at risk for this type of infection. ? Your sexual activity has changed since  you were last screened, and you are at increased risk for chlamydia or gonorrhea. Ask your health care provider if you are at risk.  Ask your health care provider about whether you are at high risk for HIV. Your health care provider may recommend a prescription medicine to help prevent HIV infection. If you choose to take medicine to prevent HIV, you should first get tested for HIV. You should then be tested every 3 months for as long as you are taking the medicine. Follow these instructions at home: Lifestyle  Do not use any products that contain nicotine or tobacco, such as cigarettes, e-cigarettes, and chewing tobacco. If you need help quitting, ask your health care provider.  Do not use street drugs.  Do not share needles.  Ask your health care provider for help if you need support or information about quitting drugs. Alcohol use  Do not drink alcohol if your health care provider tells you not to drink.  If you drink alcohol: ? Limit how much you have to 0-2 drinks a day. ? Be aware of how much alcohol is in your drink. In the [place] one drink equals one 12 oz bottle of beer (355 mL), one 5 oz glass of wine (148 mL), or one 1 oz glass of hard liquor (44 mL). General instructions  Schedule regular health, dental, and eye exams.  Stay current with your vaccines.  Tell your health care provider if: ? You often feel depressed. ? You have ever been abused or do not feel safe at home. Summary  Adopting a healthy lifestyle and getting preventive care are important in promoting health and wellness.  Follow your health care provider's instructions about healthy diet, exercising, and getting tested or screened for diseases.  Follow your health care provider's instructions on monitoring your cholesterol and blood pressure. This information is not intended to replace advice given to you by your health care provider. Make sure you discuss any questions you have with your health care  provider. Document Revised: 03/10/2018 Document Reviewed: 03/10/2018 Elsevier Patient Education  2020 [employer].

## 2019-07-08 NOTE — Progress Notes (Signed)
This visit occurred during the SARS-CoV-2 public health emergency.  Safety protocols were in place, including screening questions prior to the visit, additional usage of staff PPE, and extensive cleaning of exam room while observing appropriate contact time as indicated for disinfecting solutions.    Chief Complaint  Patient presents with  . Annual Exam    Doing well  . Medication Management    HPI: Patient  [name]  69 y.o. comes in today for Preventive Health Care visit  And Chronic disease management   resp  BP has been in range  Asthma   Ok  Seems stable [facility] sees pulm  Had vcovid  Moderna.    Early mrch second.  NOt as much time to exercise   No new  Medical issue Has dec the lexapro to 10 mg  And doing ok  Uro following psa   Health Maintenance  Topic Date Due  . INFLUENZA VACCINE  10/30/2019  . COLONOSCOPY  06/24/2022  . TETANUS/TDAP  09/09/2027  . Hepatitis C Screening  Completed  . PNA vac Low Risk Adult  Completed   Health Maintenance Review LIFESTYLE:  Exercise:   Motivation not that good  zoom exercise  Tobacco/ETS: no Alcohol:  no Sugar beverages: no Sleep:  osa  More neaps recently 1-2 hours  Drug use: no HH of   1 out door cat  dopted  Work: Fall  No     ROS:  Skin bruises easy mostly forarms and hands  Noted left more than right  Easily has large [place] ecchynosis after IV from colon right  No pain  No active bleeding  GEN/ HEENT: No fever, significant weight changes sweats headaches vision problems hearing changes, CV/ PULM; No chest pain shortness of breath cough, syncope,edema  change in exercise tolerance. GI /GU: No adominal pain, vomiting, change in bowel habits. No blood in the stool. No significant GU symptoms. SKIN/HEME: ,no acute skin rashes suspicious lesions or bleeding. No lymphadenopathy, nodules, masses.  NEURO/ PSYCH:  No neurologic signs such as weakness numbness. No depression anxiety. IMM/ Allergy: No unusual infections.   Allergy .   REST of 12 system review negative except as per HPI   Past Medical History:  Diagnosis Date  . Allergy   . Arthritis    knees  . Asthma   . Depression   . Elevated PSA    [place] and bx negative 2011  . Hypertension    nl cardiolite in 1998  . Pneumonia 2005   right hospitalized   . Rhinosinusitis   . Sleep apnea    wears CPAP    Past Surgical History:  Procedure Laterality Date  . COLONOSCOPY  03/09/2013  . FACIAL RECONSTRUCTION SURGERY    . NASAL SINUS SURGERY    . 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 shoulder atrhroscopic surgery  2014   Right  . VENTRAL HERNIA REPAIR  03-2007   rerepair winter 2010    Family History  Problem Relation Age of Onset  . Heart failure Father        MV replacement  . Asthma Mother   . Asthma Other   . Hypertension Other   . Diabetes Maternal Grandmother   . Healthy Sister   . Healthy Brother   . Healthy Brother   . Colon cancer Neg Hx   . Pancreatic cancer Neg Hx   . Stomach cancer Neg Hx   . Esophageal cancer  Neg Hx   . Colon polyps Neg Hx   . Rectal cancer Neg Hx     Social History   Socioeconomic History  . Marital status: Single    Spouse name: Not on file  . Number of children: Not on file  . Years of education: Not on file  . Highest education level: Not on file  Occupational History  . 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 Smoking status: Never Smoker  . Smokeless tobacco: Never Used  Substance and Sexual Activity  . Alcohol use: No    Comment: Quit drinking ETOH 1986  . Drug use: No  . Sexual activity: Not on file  Other Topics Concern  . 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 Difficulty of Paying Living Expenses:   Food Insecurity:   . Worried  About Charity fundraiser in the Last Year:   . Arboriculturist in the Last Year:   Transportation Needs:   . Film/video editor (Medical):   Marland Kitchen Lack of Transportation (Non-Medical):   Physical Activity:   . Days of Exercise per Week:   . Minutes of Exercise per Session:   Stress:   . Feeling of Stress :   Social Connections:   . Frequency of Communication with Friends and Family:   . Frequency of Social Gatherings with Friends and Family:   . Attends Religious Services:   . Active Member of Clubs or Organizations:   . Attends Archivist Meetings:   Marland Kitchen Marital Status:     Outpatient Medications Prior to Visit  Medication Sig Dispense Refill  . amLODipine-benazepril (LOTREL) 5-10 MG capsule Take 1 capsule by mouth daily. TAKE 1 CAPSULE BY MOUTH EVERY DAY 90 capsule 1  . azelastine (ASTELIN) 0.1 % nasal spray Place 1-2 sprays into both nostrils 2 (two) times daily as needed for rhinitis. Use in each nostril as directed 30 mL 12  . clobetasol (OLUX) 0.05 % topical foam Apply topically as needed.    . COMBIVENT RESPIMAT 20-100 MCG/ACT AERS respimat INHALE 2 PUFFS UP TO FOUR TIMES A DAY AS NEEDED 4 g 5  . escitalopram (LEXAPRO) 10 MG tablet TAKE 1 TABLET BY MOUTH EVERY DAY 90 tablet 1  . fexofenadine (ALLEGRA) 180 MG tablet Take 180 mg by mouth daily.      Marland Kitchen guaiFENesin (MUCINEX) 600 MG 12 hr tablet Take 1,200 mg by mouth daily.    Marland Kitchen ibuprofen (ADVIL) 800 MG tablet TAKE 1 TABLET BY MOUTH EVERY 8 HOURS AS NEEDED 60 tablet 1  . mometasone (NASONEX) 50 MCG/ACT nasal spray USE 1 TO 2 SPRAYS IN EACH NOSTRIL DAILY 17 g 3  . silodosin (RAPAFLO) 8 MG CAPS capsule Take 1 capsule (8 mg total) by mouth at bedtime. 30 capsule 11  . triamcinolone cream (KENALOG) 0.1 % Apply 1 application topically as needed.    Grant Ruts INHUB 100-50 MCG/DOSE AEPB INHALE 1 PUFF BY MOUTH TWICE A DAY 60 each 6   No facility-administered medications prior to visit.     EXAM:  BP 138/82   Pulse 70    Temp 97.7 F (36.5 C) (Temporal)   Ht 5' 3.25" (1.607 m)  Wt 219 lb 9.6 oz (99.6 kg)   SpO2 98%   BMI 38.59 kg/m   Body mass index is 38.59 kg/m. Wt Readings from Last 3 Encounters:  07/08/19 219 lb 9.6 oz (99.6 kg)  06/24/19 223 lb (101.2 kg)  06/10/19 223 lb (101.2 kg)   Wt Readings from Last 3 Encounters:  07/08/19 219 lb 9.6 oz (99.6 kg)  06/24/19 223 lb (101.2 kg)  06/10/19 223 lb (101.2 kg)    Physical Exam: Vital signs reviewed [id number] is a well-developed well-nourished alert cooperative    who appearsr stated age in no acute distress.  HEENT: normocephalic atraumatic , Eyes: PERRL EOM's full, , Ears: no deformity EAC's clear TMs with normal landmarks. Mouth: clear OP, masked NECK: supple without masses, thyromegaly or bruits. CHEST/PULM:  Clear to auscultation and percussion breath sounds equal no wheeze , rales or rhonchi. No chest wall deformities or tenderness. CV: PMI is nondisplaced, S1 S2 no gallops, murmurs, rubs. Peripheral pulses are full without delay.No JVD .  ABDOMEN: Bowel sounds normal nontender  No guard or rebound, no hepato splenomegal no CVA tenderness. \ diasteisi recti Extremtities:  No clubbing cyanosis or edema, no acute joint swelling or redness no focal atrophy has palamr erythema and dorsum elft heand darker pink butnl cap refill  Old senile ecchymosois ntpoed  NEURO:  Oriented x3, cranial nerves 3-12 appear to be intact, no obvious focal weakness,gait within normal limits no abnormal reflexes or asymmetrical SKIN: No acute rashes normal turgor, see ext  Feet nl dorsum hands pink an  Ecchymosis forarms upper thigh and hands  No petechia . PSYCH: Oriented, good eye contact, no obvious depression anxiety, cognition and judgment appear normal. LN: no cervical axillary inguinal adenopathy  Lab Results  Component Value Date   WBC 7.3 06/30/2019   HGB 13.7 06/30/2019   HCT 38.8 (L) 06/30/2019   PLT 160.0 06/30/2019   GLUCOSE 85 06/30/2019   CHOL  166 06/30/2019   TRIG 124.0 06/30/2019   HDL 41.90 06/30/2019   LDLCALC 100 (H) 06/30/2019   ALT 17 06/30/2019   AST 16 06/30/2019   NA 134 (L) 06/30/2019   K 4.2 06/30/2019   CL 100 06/30/2019   CREATININE 0.71 06/30/2019   BUN 9 06/30/2019   CO2 28 06/30/2019   TSH 2.72 06/30/2019   PSA 6.08 (H) 06/30/2019   INR 1.0 01/28/2018   HGBA1C 5.5 06/30/2019    BP Readings from Last 3 Encounters:  07/08/19 138/82  06/24/19 (!) 147/84  01/28/18 122/78    Lab results reviewed with patient   ASSESSMENT AND PLAN:  Discussed the following assessment and plan:    ICD-10-CM   1. Visit for preventive health examination  Z00.00   2. Medication management  Z79.899    mood ms stable  considering shut down   3. Essential hypertension  I10   4. Asthma, chronic, unspecified asthma severity, uncomplicated  [id number]   5. Obstructive sleep apnea  G47.33   6. Increased MCV  D75.89 CBC with Differential/Platelet    Vitamin B12    Folate  7. Senile ecchymosis  R23.3    a bit more extensive than average has pas hx of steroid use for pulm cause will follow    Left hand is mosl;ty dicolored and erythema palm but this could be from left over bruising   And hemosiderinuria color   has some palmar erythema  Bilateral and not sure if significant or med related  mcv a bit up  on  Lab will follow and check b12 folate etc   His eosinophil is  Not alarming  After dc ing accolate in past .  Cont  Return in about 4 months (around 11/07/2019) for lab in 4 months and then go from there. .  Patient Care Team: Kenrick Pore, Standley Brooking, MD as PCP - General orthopedic Festus Aloe, MD as Attending Physician (Urology) Deneise Lever, MD as Attending Physician (Pulmonary Disease) Patient Instructions  Agree with increae in activitry healthy eating.  Skin bleeding  Protection Pland recheck cbc diff and b12 folate in  4 months  And then as indicated    Health Maintenance, Male Adopting a healthy lifestyle and  getting preventive care are important in promoting health and wellness. Ask your health care provider about:  The right schedule for you to have regular tests and exams.  Things you can do on your own to prevent diseases and keep yourself healthy. What should I know about diet, weight, and exercise? Eat a healthy diet   Eat a diet that includes plenty of vegetables, fruits, low-fat dairy products, and lean protein.  Do not eat a lot of foods that are high in solid fats, added sugars, or sodium. Maintain a healthy weight Body mass index (BMI) is a measurement that can be used to identify possible weight problems. It estimates body fat based on height and weight. Your health care provider can help determine your BMI and help you achieve or maintain a healthy weight. Get regular exercise Get regular exercise. This is one of the most important things you can do for your health. Most adults should:  Exercise for at least 150 minutes each week. The exercise should increase your heart rate and make you sweat (moderate-intensity exercise).  Do strengthening exercises at least twice a week. This is in addition to the moderate-intensity exercise.  Spend less time sitting. Even light physical activity can be beneficial. Watch cholesterol and blood lipids Have your blood tested for lipids and cholesterol at 68 years of age, then have this test every 5 years. You may need to have your cholesterol levels checked more often if:  Your lipid or cholesterol levels are high.  You are older than 68 years of age.  You are at high risk for heart disease. What should I know about cancer screening? Many types of cancers can be detected early and may often be prevented. Depending on your health history and family history, you may need to have cancer screening at various ages. This may include screening for:  Colorectal cancer.  Prostate cancer.  Skin cancer.  Lung cancer. What should I know about  heart disease, diabetes, and high blood pressure? Blood pressure and heart disease  High blood pressure causes heart disease and increases the risk of stroke. This is more likely to develop in people who have high blood pressure readings, are of African descent, or are overweight.  Talk with your health care provider about your target blood pressure readings.  Have your blood pressure checked: ? Every 3-5 years if you are 68-35 years of age. ? Every year if you are 50 years old or older.  If you are between the ages of 35 and 62 and are a current or former smoker, ask your health care provider if you should have a one-time screening for abdominal aortic aneurysm (AAA). Diabetes Have regular diabetes screenings. This checks your fasting blood sugar level. Have the screening done:  Once every three years after age  45 if you are at a normal weight and have a low risk for diabetes.  More often and at a younger age if you are overweight or have a high risk for diabetes. What should I know about preventing infection? Hepatitis B If you have a higher risk for hepatitis B, you should be screened for this virus. Talk with your health care provider to find out if you are at risk for hepatitis B infection. Hepatitis C Blood testing is recommended for:  Everyone born from [birth year] through 1964.  Anyone with known risk factors for hepatitis C. Sexually transmitted infections (STIs)  You should be screened each year for STIs, including gonorrhea and chlamydia, if: ? You are sexually active and are younger than 68 years of age. ? You are older than 68 years of age and your health care provider tells you that you are at risk for this type of infection. ? Your sexual activity has changed since you were last screened, and you are at increased risk for chlamydia or gonorrhea. Ask your health care provider if you are at risk.  Ask your health care provider about whether you are at high risk for HIV. Your  health care provider may recommend a prescription medicine to help prevent HIV infection. If you choose to take medicine to prevent HIV, you should first get tested for HIV. You should then be tested every 3 months for as long as you are taking the medicine. Follow these instructions at home: Lifestyle  Do not use any products that contain nicotine or tobacco, such as cigarettes, e-cigarettes, and chewing tobacco. If you need help quitting, ask your health care provider.  Do not use street drugs.  Do not share needles.  Ask your health care provider for help if you need support or information about quitting drugs. Alcohol use  Do not drink alcohol if your health care provider tells you not to drink.  If you drink alcohol: ? Limit how much you have to 0-2 drinks a day. ? Be aware of how much alcohol is in your drink. In the [place] one drink equals one 12 oz bottle of beer (355 mL), one 5 oz glass of wine (148 mL), or one 1 oz glass of hard liquor (44 mL). General instructions  Schedule regular health, dental, and eye exams.  Stay current with your vaccines.  Tell your health care provider if: ? You often feel depressed. ? You have ever been abused or do not feel safe at home. Summary  Adopting a healthy lifestyle and getting preventive care are important in promoting health and wellness.  Follow your health care provider's instructions about healthy diet, exercising, and getting tested or screened for diseases.  Follow your health care provider's instructions on monitoring your cholesterol and blood pressure. This information is not intended to replace advice given to you by your health care provider. Make sure you discuss any questions you have with your health care provider. Document Revised: 03/10/2018 Document Reviewed: 03/10/2018 Elsevier Patient Education  2020 [place] Jaylissa Felty M.D.

## 2019-07-11 DIAGNOSIS — F411 Generalized anxiety disorder: Secondary | ICD-10-CM | POA: Diagnosis not present

## 2019-07-11 DIAGNOSIS — G4733 Obstructive sleep apnea (adult) (pediatric): Secondary | ICD-10-CM | POA: Diagnosis not present

## 2019-07-13 ENCOUNTER — Other Ambulatory Visit: Payer: Self-pay | Admitting: Internal Medicine

## 2019-07-13 DIAGNOSIS — N400 Enlarged prostate without lower urinary tract symptoms: Secondary | ICD-10-CM | POA: Diagnosis not present

## 2019-07-13 DIAGNOSIS — R972 Elevated prostate specific antigen [PSA]: Secondary | ICD-10-CM | POA: Diagnosis not present

## 2019-07-24 ENCOUNTER — Other Ambulatory Visit: Payer: Self-pay | Admitting: Internal Medicine

## 2019-07-25 DIAGNOSIS — H5213 Myopia, bilateral: Secondary | ICD-10-CM | POA: Diagnosis not present

## 2019-07-25 DIAGNOSIS — H25013 Cortical age-related cataract, bilateral: Secondary | ICD-10-CM | POA: Diagnosis not present

## 2019-07-25 DIAGNOSIS — H524 Presbyopia: Secondary | ICD-10-CM | POA: Diagnosis not present

## 2019-07-25 DIAGNOSIS — H2513 Age-related nuclear cataract, bilateral: Secondary | ICD-10-CM | POA: Diagnosis not present

## 2019-07-25 MED ORDER — FLUTICASONE PROPIONATE 50 MCG/ACT NA SUSP: 2.0000 | Freq: Every day | NASAL | 99 refills | Status: DC

## 2019-07-25 MED ORDER — CEFDINIR 300 MG PO CAPS: 300.0000 mg | ORAL_CAPSULE | Freq: Two times a day (BID) | ORAL | 0 refills | Status: DC

## 2019-07-25 NOTE — Telephone Encounter (Signed)
Dr. Annamaria Boots Please advise on patients mychart message:  My next appointment is not until June 22. I am fully Covid-19 vaccinated. I have no fever (97.7 according to my [place]). I have had a headache for almost a week. The headache is all over but more severe in cheeks and forehead. I do not clear anything significant when I blow my nose, however, occasionally when I clear my sinuses postnasally (does that make sense?) I sometimes get some thick bloody stuff. I am trying to use a saline nasal wash. Is it possible that I have a sinus infection?  Also, my insurance no longer will pay for mometasone nasal spray (Nasonex). They want me to use Flonase or some other product. May I get a prescription for one of those? Do you have a recommendation?  Thank you!

## 2019-07-25 NOTE — Telephone Encounter (Signed)
For sinusitis- offer cefdinir 300 mg, # 20, 1 twice daily                        Offer script Flonase  # 1,   2 puffs each nostril once daily, refill prn

## 2019-07-25 NOTE — Addendum Note (Signed)
Addended by: Desmond Dike C on: 07/25/2019 11:16 AM   Modules accepted: Orders

## 2019-08-08 DIAGNOSIS — F411 Generalized anxiety disorder: Secondary | ICD-10-CM | POA: Diagnosis not present

## 2019-08-09 ENCOUNTER — Other Ambulatory Visit: Payer: Self-pay | Admitting: Internal Medicine

## 2019-09-05 DIAGNOSIS — F411 Generalized anxiety disorder: Secondary | ICD-10-CM | POA: Diagnosis not present

## 2019-09-15 DIAGNOSIS — R972 Elevated prostate specific antigen [PSA]: Secondary | ICD-10-CM | POA: Diagnosis not present

## 2019-09-16 MED ORDER — IBUPROFEN 800 MG PO TABS: 800.0000 mg | ORAL_TABLET | Freq: Three times a day (TID) | ORAL | 1 refills | Status: DC | PRN

## 2019-09-16 NOTE — Telephone Encounter (Signed)
I refilled

## 2019-09-20 ENCOUNTER — Encounter: Payer: Self-pay | Admitting: Internal Medicine

## 2019-09-20 ENCOUNTER — Ambulatory Visit: Payer: Medicare PPO | Admitting: Internal Medicine

## 2019-09-20 ENCOUNTER — Other Ambulatory Visit: Payer: Self-pay

## 2019-09-20 VITALS — BP 120/73 | HR 75 | Temp 97.7°F | Ht 63.0 in | Wt 217.0 lb

## 2019-09-20 DIAGNOSIS — D696 Thrombocytopenia, unspecified: Secondary | ICD-10-CM

## 2019-09-20 DIAGNOSIS — J4520 Mild intermittent asthma, uncomplicated: Secondary | ICD-10-CM

## 2019-09-20 DIAGNOSIS — G4733 Obstructive sleep apnea (adult) (pediatric): Secondary | ICD-10-CM

## 2019-09-20 NOTE — Patient Instructions (Signed)
We can continue CPAP auto 10-20, mask of choice, humidifier, supplies, AirView/ card  Before diving use Combivent to open airways. There is increased risk of collapsing a lung or getting an air embolism from pressure changes with asthma.  For the easy bruising, suggest Vitamin C, avoid aspirin, ask Dr Regis Bill about checking for platelet disorders like [place], or seeing a hematologist.  Please call if we can help

## 2019-09-20 NOTE — Assessment & Plan Note (Signed)
He showed me ecchymotic bruising. Sun exposed skin is more vulnerable. Consider possibility of a clotting disorder such as [place]. He can discuss with Dr Regis Bill.

## 2019-09-20 NOTE — Assessment & Plan Note (Signed)
Mild intermittent uncomplicated.  Meds are sufficient. He understands scuba diving carries inherent risks, with increased risk especially related to potentially fatal barotrauma with asthma. He knows not to dive if active asthma. Pretreating with Combivent and maintenance use of Wixela are appropriate.

## 2019-09-20 NOTE — Assessment & Plan Note (Signed)
Benefits from CPAP with good compliance and control Plan- continue auto 10-20 

## 2019-09-20 NOTE — Progress Notes (Signed)
Subjective:    Patient ID: [name], male    DOB: Sep 17, 1950, 69 y.o.   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 FENO-12/26/2015- 30 (mildly elevated) ANCA- neg ANA + 1:80 Respiratory Allergy Profile 06/11/16--total IgE WNL 88, mild elevations for dust mite, cat, dog, aspergillus fumigatus, hickory tree pollen Office Spirometry 06/11/2016-WNL-FVC 3.95/103%, FEV1 2.71/95%, ratio 0.69, FEF 25-75% 1.60/ 69% Office Spirometry 06/11/17-minimal obstructive airways disease, small airways.  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  Asthma doing well- few minor epsiodes blamed on allergy- feels well controlled w current meds. Using his exercycle to maintain stamina.  Very satisfied with CPAP. Discussed download.   Observations/Objective: Download 100% compliance, AHI 1.5/ hr  CXR 06/11/17- IMPRESSION: Small bilateral pleural effusions with mild atelectasis of both lung bases. No focal pneumonia or pulmonary edema.  Assessment and Plan: OSA- benefits from CPAP- Plan to continue auto 10-20 Asthma- Not diving currently. 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 Asks updated health form, anticipating getting certified for somewhat deeper dives. We talked again about barotrauma risk with hx asthma. He pretreats with Combivent, continues Wixela, and has had no problems. He has had no significant asthma exacerbation since last here and feels current meds serve well. Except for diving, has had infrequent need for rescue inhaler.  Doing very well with CPAP- no concerns. Has had long-standing very easy ecchymotic bruising on sun-exposed arms and legs. No fam hx of bleeding disorder. Denies aspirin and med list doesn't point to drug cause. Has recheck labs pending with Dr Regis Bill.   ROS-see HPI + = positive Constitutional:   No-   weight loss, night sweats, fevers, chills, fatigue, lassitude. HEENT:   No-  headaches, difficulty swallowing, tooth/dental problems, sore throat,       No-  sneezing, itching, ear ache, nasal congestion, post nasal drip,  CV:  No-   chest pain, orthopnea, PND, +swelling in lower extremities, anasarca, dizziness, palpitations Resp: No-   shortness of breath with exertion or at rest.              No-   productive cough,  No non-productive cough,  No- coughing up of blood.              No-   change in color of mucus.   wheezing.   Skin: No-   rash or lesions. GI:  No-   heartburn, indigestion, abdominal pain, nausea, vomiting,  GU: n. MS:  No-   joint pain or swelling.   Neuro-  +early Parkinson's Psych:  No- change in mood or affect. No depression or anxiety.  No memory loss.  OBJ- Physical Exam General-  Alert, Oriented, Affect-appropriate, Distress- none acute, + obese Skin- + ecchymoses on arms and legs, wearing gloves and long sleeves.  Lymphadenopathy- none Head- atraumatic            Eyes-  Gross vision intact, PERRLA, conjunctivae and secretions clear            Ears- Hearing, canals-normal            Nose- Clear, + external dev, no- mucus, polyps, erosion, perforation             Throat- Mallampati II , mucosa clear , drainage- none, tonsils- atrophic Neck- flexible , trachea midline, no stridor , thyroid nl, carotid no bruit Chest - symmetrical excursion , unlabored           Heart/CV- RRR , no murmur , no gallop  , no rub, nl s1 s2                           - JVD- none , edema- none, stasis changes- none, varices- none           Lung- clear to P&A, wheeze- none, cough- none , dullness-none, rub- none           Chest wall-  Abd-  Br/ Gen/ Rectal- Not done, not indicated Extrem- cyanosis- none, clubbing, none, atrophy- none, strength- nl, + surgical scar L knee, +healing flap laceration L calf. Neuro-non-focal

## 2019-10-03 DIAGNOSIS — F411 Generalized anxiety disorder: Secondary | ICD-10-CM | POA: Diagnosis not present

## 2019-10-12 DIAGNOSIS — G4733 Obstructive sleep apnea (adult) (pediatric): Secondary | ICD-10-CM | POA: Diagnosis not present

## 2019-10-17 NOTE — Addendum Note (Signed)
Addended by: Marrion Coy on: 10/17/2019 03:54 PM   Modules accepted: Orders

## 2019-10-27 ENCOUNTER — Other Ambulatory Visit: Payer: Self-pay | Admitting: Internal Medicine

## 2019-10-31 DIAGNOSIS — F411 Generalized anxiety disorder: Secondary | ICD-10-CM | POA: Diagnosis not present

## 2019-11-07 ENCOUNTER — Other Ambulatory Visit: Payer: Medicare PPO

## 2019-11-07 ENCOUNTER — Other Ambulatory Visit: Payer: Self-pay

## 2019-11-07 DIAGNOSIS — D529 Folate deficiency anemia, unspecified: Secondary | ICD-10-CM | POA: Diagnosis not present

## 2019-11-07 DIAGNOSIS — D7589 Other specified diseases of blood and blood-forming organs: Secondary | ICD-10-CM

## 2019-11-07 LAB — CBC WITH DIFFERENTIAL/PLATELET
Absolute Monocytes: 907 cells/uL (ref 200–950)
Basophils Absolute: 50 cells/uL (ref 0–200)
Basophils Relative: 0.7 %
Eosinophils Absolute: 763 cells/uL — ABNORMAL HIGH (ref 15–500)
Eosinophils Relative: 10.6 %
HCT: 40.4 % (ref 38.5–50.0)
Hemoglobin: 14 g/dL (ref 13.2–17.1)
Lymphs Abs: 1116 cells/uL (ref 850–3900)
MCH: 35.6 pg — ABNORMAL HIGH (ref 27.0–33.0)
MCHC: 34.7 g/dL (ref 32.0–36.0)
MCV: 102.8 fL — ABNORMAL HIGH (ref 80.0–100.0)
MPV: 10.6 fL (ref 7.5–12.5)
Monocytes Relative: 12.6 %
Neutro Abs: 4363 cells/uL (ref 1500–7800)
Neutrophils Relative %: 60.6 %
Platelets: 169 10*3/uL (ref 140–400)
RBC: 3.93 10*6/uL — ABNORMAL LOW (ref 4.20–5.80)
RDW: 13.5 % (ref 11.0–15.0)
Total Lymphocyte: 15.5 %
WBC: 7.2 10*3/uL (ref 3.8–10.8)

## 2019-11-07 LAB — VITAMIN B12: Vitamin B-12: 318 pg/mL (ref 200–1100)

## 2019-11-07 LAB — FOLATE: Folate: 15.8 ng/mL

## 2019-11-18 NOTE — Telephone Encounter (Signed)
So you do not meet criteria for immunosuppressed  But can get booster on the 8 mos out mark   From your last  Shot  We will see if they change that advice     By [facility]

## 2019-11-19 NOTE — Progress Notes (Signed)
Blood count improved but still large size RBC  b12 is in normal range but on the low side . I advise a trial of  sublingual  dissolvable (  otc)  b12 1000 mcg per day .  Check cbc diff and b12 level in  about 4 months  then do  fu video or in person  to discuss results ( dx elevated  mcv)   Apology for late   review

## 2019-11-22 ENCOUNTER — Other Ambulatory Visit: Payer: Self-pay

## 2019-11-22 DIAGNOSIS — R718 Other abnormality of red blood cells: Secondary | ICD-10-CM

## 2019-11-28 DIAGNOSIS — F411 Generalized anxiety disorder: Secondary | ICD-10-CM | POA: Diagnosis not present

## 2019-12-02 ENCOUNTER — Encounter: Payer: Self-pay | Admitting: Internal Medicine

## 2019-12-20 MED ORDER — AZITHROMYCIN 250 MG PO TABS
250.0000 mg | ORAL_TABLET | ORAL | 0 refills | Status: DC
Start: 2019-12-20 — End: 2020-03-20

## 2019-12-20 NOTE — Addendum Note (Signed)
Addended by: Rosana Berger on: 12/20/2019 05:14 PM   Modules accepted: Orders

## 2019-12-20 NOTE — Telephone Encounter (Signed)
Dr. Annamaria Boots please advise on following my chart message. Thank you   Hello Dr. Annamaria Boots.  I have been coughing up oysters. (I know that's gross but it describes it.) I haven't been wheezing though. I'm tired from my trip but otherwise I feel fine.  I just returned from a week of scuba diving in [facility]. 14 dives, no issues. I used my own regulator. I know the air can be very dry but I don't know if that would cause this issue.  If I need to come in let me know. Thank you so much.

## 2019-12-20 NOTE — Telephone Encounter (Signed)
Pt added another email- please see in addition to other below, thanks  By-the-way, my last dive was Sunday morning. I been coughing for about 3 days.

## 2019-12-20 NOTE — Telephone Encounter (Signed)
If mucus is discolored and this feels like infection, we can send in Zpak 250 mg, # 6, 2 today, then one daily. Otherwise, suggest continuing routine inhalers and mucinex and see if it will clear over next several days.

## 2019-12-21 ENCOUNTER — Other Ambulatory Visit: Payer: Self-pay | Admitting: Internal Medicine

## 2019-12-26 DIAGNOSIS — F411 Generalized anxiety disorder: Secondary | ICD-10-CM | POA: Diagnosis not present

## 2019-12-27 DIAGNOSIS — Z20828 Contact with and (suspected) exposure to other viral communicable diseases: Secondary | ICD-10-CM | POA: Diagnosis not present

## 2020-01-06 ENCOUNTER — Ambulatory Visit (INDEPENDENT_AMBULATORY_CARE_PROVIDER_SITE_OTHER): Payer: Medicare PPO | Admitting: *Deleted

## 2020-01-06 ENCOUNTER — Other Ambulatory Visit: Payer: Self-pay

## 2020-01-06 DIAGNOSIS — Z23 Encounter for immunization: Secondary | ICD-10-CM

## 2020-01-11 DIAGNOSIS — R3912 Poor urinary stream: Secondary | ICD-10-CM | POA: Diagnosis not present

## 2020-01-11 DIAGNOSIS — R972 Elevated prostate specific antigen [PSA]: Secondary | ICD-10-CM | POA: Diagnosis not present

## 2020-01-11 DIAGNOSIS — N401 Enlarged prostate with lower urinary tract symptoms: Secondary | ICD-10-CM | POA: Diagnosis not present

## 2020-01-12 ENCOUNTER — Other Ambulatory Visit: Payer: Self-pay | Admitting: Internal Medicine

## 2020-01-13 MED ORDER — AMLODIPINE BESY-BENAZEPRIL HCL 5-10 MG PO CAPS: 1.0000 | ORAL_CAPSULE | Freq: Every day | ORAL | 1 refills | Status: DC

## 2020-01-13 NOTE — Addendum Note (Signed)
Addended by: Rodrigo Ran on: 01/13/2020 09:39 AM   Modules accepted: Orders

## 2020-01-13 NOTE — Addendum Note (Signed)
Addended by: Rodrigo Ran on: 01/13/2020 08:11 AM   Modules accepted: Orders

## 2020-01-13 NOTE — Addendum Note (Signed)
Addended by: Nathanial Millman E on: 01/13/2020 04:40 PM   Modules accepted: Orders

## 2020-02-06 DIAGNOSIS — F411 Generalized anxiety disorder: Secondary | ICD-10-CM | POA: Diagnosis not present

## 2020-02-08 DIAGNOSIS — M25561 Pain in right knee: Secondary | ICD-10-CM | POA: Diagnosis not present

## 2020-02-08 DIAGNOSIS — M25562 Pain in left knee: Secondary | ICD-10-CM | POA: Diagnosis not present

## 2020-02-10 ENCOUNTER — Other Ambulatory Visit (HOSPITAL_COMMUNITY): Payer: Self-pay | Admitting: Orthopedic Surgery

## 2020-02-10 ENCOUNTER — Other Ambulatory Visit: Payer: Self-pay | Admitting: Orthopedic Surgery

## 2020-02-10 DIAGNOSIS — Z96652 Presence of left artificial knee joint: Secondary | ICD-10-CM

## 2020-02-16 ENCOUNTER — Other Ambulatory Visit (HOSPITAL_COMMUNITY): Payer: Medicare PPO

## 2020-02-16 ENCOUNTER — Encounter (HOSPITAL_COMMUNITY): Payer: Medicare PPO

## 2020-02-22 ENCOUNTER — Encounter (HOSPITAL_COMMUNITY)
Admission: RE | Admit: 2020-02-22 | Discharge: 2020-02-22 | Disposition: A | Discharge status: Home / Self Care | Payer: Medicare PPO | Source: Ambulatory Visit | Attending: Orthopedic Surgery | Admitting: Orthopedic Surgery

## 2020-02-22 ENCOUNTER — Other Ambulatory Visit: Payer: Self-pay

## 2020-02-22 DIAGNOSIS — Z96652 Presence of left artificial knee joint: Secondary | ICD-10-CM | POA: Insufficient documentation

## 2020-02-22 DIAGNOSIS — M25562 Pain in left knee: Secondary | ICD-10-CM | POA: Diagnosis not present

## 2020-02-22 DIAGNOSIS — M25561 Pain in right knee: Secondary | ICD-10-CM | POA: Diagnosis not present

## 2020-02-22 MED ORDER — TECHNETIUM TC 99M MEDRONATE IV KIT
21.6000 | PACK | Freq: Once | INTRAVENOUS | Status: AC | PRN
  Administered 2020-02-22: 21.6 via INTRAVENOUS

## 2020-02-24 ENCOUNTER — Other Ambulatory Visit: Payer: Self-pay | Admitting: Internal Medicine

## 2020-03-13 ENCOUNTER — Other Ambulatory Visit: Payer: Medicare PPO

## 2020-03-13 ENCOUNTER — Other Ambulatory Visit: Payer: Self-pay

## 2020-03-13 DIAGNOSIS — F411 Generalized anxiety disorder: Secondary | ICD-10-CM | POA: Diagnosis not present

## 2020-03-13 DIAGNOSIS — R718 Other abnormality of red blood cells: Secondary | ICD-10-CM

## 2020-03-13 DIAGNOSIS — R6889 Other general symptoms and signs: Secondary | ICD-10-CM | POA: Diagnosis not present

## 2020-03-14 LAB — CBC WITH DIFFERENTIAL/PLATELET
Absolute Monocytes: 734 cells/uL (ref 200–950)
Basophils Absolute: 32 cells/uL (ref 0–200)
Basophils Relative: 0.6 %
Eosinophils Absolute: 616 cells/uL — ABNORMAL HIGH (ref 15–500)
Eosinophils Relative: 11.4 %
HCT: 41.8 % (ref 38.5–50.0)
Hemoglobin: 14.4 g/dL (ref 13.2–17.1)
Lymphs Abs: 805 cells/uL — ABNORMAL LOW (ref 850–3900)
MCH: 35.6 pg — ABNORMAL HIGH (ref 27.0–33.0)
MCHC: 34.4 g/dL (ref 32.0–36.0)
MCV: 103.5 fL — ABNORMAL HIGH (ref 80.0–100.0)
MPV: 11 fL (ref 7.5–12.5)
Monocytes Relative: 13.6 %
Neutro Abs: 3213 cells/uL (ref 1500–7800)
Neutrophils Relative %: 59.5 %
Platelets: 152 10*3/uL (ref 140–400)
RBC: 4.04 10*6/uL — ABNORMAL LOW (ref 4.20–5.80)
RDW: 13.2 % (ref 11.0–15.0)
Total Lymphocyte: 14.9 %
WBC: 5.4 10*3/uL (ref 3.8–10.8)

## 2020-03-14 LAB — VITAMIN B12: Vitamin B-12: 553 pg/mL (ref 200–1100)

## 2020-03-19 ENCOUNTER — Ambulatory Visit: Payer: Medicare PPO | Admitting: Internal Medicine

## 2020-03-20 ENCOUNTER — Ambulatory Visit (INDEPENDENT_AMBULATORY_CARE_PROVIDER_SITE_OTHER): Payer: Medicare PPO | Admitting: Internal Medicine

## 2020-03-20 ENCOUNTER — Other Ambulatory Visit: Payer: Self-pay

## 2020-03-20 ENCOUNTER — Encounter: Payer: Self-pay | Admitting: Internal Medicine

## 2020-03-20 VITALS — BP 132/74 | HR 57 | Temp 97.9°F | Ht 63.0 in | Wt 205.8 lb

## 2020-03-20 DIAGNOSIS — M898X9 Other specified disorders of bone, unspecified site: Secondary | ICD-10-CM | POA: Diagnosis not present

## 2020-03-20 DIAGNOSIS — D721 Eosinophilia, unspecified: Secondary | ICD-10-CM | POA: Diagnosis not present

## 2020-03-20 DIAGNOSIS — Z92241 Personal history of systemic steroid therapy: Secondary | ICD-10-CM

## 2020-03-20 DIAGNOSIS — R718 Other abnormality of red blood cells: Secondary | ICD-10-CM | POA: Diagnosis not present

## 2020-03-20 DIAGNOSIS — M8580 Other specified disorders of bone density and structure, unspecified site: Secondary | ICD-10-CM

## 2020-03-20 DIAGNOSIS — R799 Abnormal finding of blood chemistry, unspecified: Secondary | ICD-10-CM

## 2020-03-20 NOTE — Progress Notes (Signed)
Chief Complaint  Patient presents with  . Follow-up    F/u labs results done on 12/14.   C/o having problems in his LT knee and wants to discuss test that where done.     HPI: [name] 69 y.o. come in for fu  On b 12 sl   Bruising external forearm  Still present but no other bleeding Has had  denatl problems loss of teeth poss bone issues  Had to have a graft in past. Knee pain and had scan  Show poss loosening?    No fu from other yet   Has reached  Out  To Dr  Alvan Dame office but hasnt heard  .   Father had myelodyspalsia and passed  At 38   Broth had dx of P vera  recnetly took  [place] trip did ok  ROS: See pertinent positives and negatives per HPI.  Past Medical History:  Diagnosis Date  . Allergy   . Arthritis    knees  . Asthma   . Depression   . Elevated PSA    [place] and bx negative 2011  . Hypertension    nl cardiolite in 1998  . Pneumonia 2005   right hospitalized   . Rhinosinusitis   . Sleep apnea    wears CPAP    Family History  Problem Relation Age of Onset  . Heart failure Father        MV replacement  . Myelodysplastic syndrome Father   . Asthma Mother   . Polycythemia Brother   . Asthma Other   . Hypertension Other   . Diabetes Maternal Grandmother   . Healthy Sister   . Healthy Brother   . Healthy Brother   . Colon cancer Neg Hx   . Pancreatic cancer Neg Hx   . Stomach cancer Neg Hx   . Esophageal cancer Neg Hx   . Colon polyps Neg Hx   . Rectal cancer Neg Hx     Social History   Socioeconomic History  . Marital status: Single    Spouse name: Not on file  . Number of children: Not on file  . Years of education: Not on file  . Highest education level: Not on file  Occupational History  . 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 Smoking status: Never Smoker  . Smokeless tobacco: Never Used  Vaping Use  . Vaping Use: Never used  Substance and Sexual Activity  . Alcohol use: No    Comment: Quit  drinking ETOH 1986  . Drug use: No  . Sexual activity: Yes  Other Topics Concern  . 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 amLODipine-benazepril (LOTREL) 5-10 MG capsule Take 1 capsule by mouth daily. TAKE 1 CAPSULE BY MOUTH EVERY DAY 90 capsule 1  . amoxicillin (AMOXIL) 500 MG capsule     . azelastine (ASTELIN) 0.1 % nasal spray Place 1-2 sprays into both nostrils 2 (two)  times daily as needed for rhinitis. Use in each nostril as directed 30 mL 12  . clobetasol (OLUX) 0.05 % topical foam Apply topically as needed.    . COMBIVENT RESPIMAT 20-100 MCG/ACT AERS respimat INHALE 2 PUFFS UP TO FOUR TIMES A DAY AS NEEDED 1 each 5  . escitalopram (LEXAPRO) 10 MG tablet TAKE 1 TABLET BY MOUTH EVERY DAY 90 tablet 1  . fexofenadine (ALLEGRA) 180 MG tablet Take 180 mg by mouth daily.    . fluticasone (FLONASE) 50 MCG/ACT nasal spray Place 2 sprays into both nostrils daily. 16 g prn  . guaiFENesin (MUCINEX) 600 MG 12 hr tablet Take 1,200 mg by mouth daily.    Marland Kitchen ibuprofen (ADVIL) 800 MG tablet TAKE 1 TABLET BY MOUTH EVERY 8 HOURS AS NEEDED 60 tablet 1  . silodosin (RAPAFLO) 8 MG CAPS capsule Take 1 capsule (8 mg total) by mouth at bedtime. 30 capsule 11  . triamcinolone cream (KENALOG) 0.1 % Apply 1 application topically as needed.    Grant Ruts INHUB 100-50 MCG/DOSE AEPB TAKE 1 PUFF BY MOUTH TWICE A DAY 60 each 6  . azithromycin (ZITHROMAX) 250 MG tablet Take 1 tablet (250 mg total) by mouth as directed. 6 tablet 0  . cefdinir (OMNICEF) 300 MG capsule Take 1 capsule (300 mg total) by mouth 2  (two) times daily. 20 capsule 0   No facility-administered medications prior to visit.     EXAM:  BP 132/74   Pulse (!) 57   Temp 97.9 F (36.6 C) (Oral)   Ht 5\' 3"  (1.6 m)   Wt 205 lb 12.8 oz (93.4 kg)   SpO2 99%   BMI 36.46 kg/m   Body mass index is 36.46 kg/m.  GENERAL: vitals reviewed and listed above, alert, oriented, appears well hydrated and in no acute distress HEENT: atraumatic, conjunctiva  clear, no obvious abnormalities on inspection of external nose and ears OP : masked  NECK: no obvious masses on inspection palpation  Skin multiple bruising   Senile type pretty extensive   gati independent  CV: HRRR, no clubbing cyanosis or  peripheral edema nl cap refill  MS: moves all extremities without noticeable focal  abnormality PSYCH: pleasant and cooperative, no obvious depression or anxiety Lab Results  Component Value Date   WBC 5.4 03/13/2020   HGB 14.4 03/13/2020   HCT 41.8 03/13/2020   PLT 152 03/13/2020   GLUCOSE 85 06/30/2019   CHOL 166 06/30/2019   TRIG 124.0 06/30/2019   HDL 41.90 06/30/2019   LDLCALC 100 (H) 06/30/2019   ALT 17 06/30/2019   AST 16 06/30/2019   NA 134 (L) 06/30/2019   K 4.2 06/30/2019   CL 100 06/30/2019   CREATININE 0.71 06/30/2019   BUN 9 06/30/2019   CO2 28 06/30/2019   TSH 2.72 06/30/2019   PSA 6.08 (H) 06/30/2019   INR 1.0 01/28/2018   HGBA1C 5.5 06/30/2019   BP Readings from Last 3 Encounters:  03/20/20 132/74  09/20/19 120/73  07/08/19 138/82   Labs reviewed with patient ASSESSMENT AND PLAN:  Discussed the following assessment and plan:  Elevated MCV - Plan: Ambulatory referral to Hematology  Bone loss - Plan: DG Bone Density  Abnormal blood cell count - Plan: Ambulatory referral to Hematology  History of steroid therapy - Plan: DG Bone Density  Eosinophilia, unspecified type - Plan: Ambulatory referral to Hematology Uncertain if  Could have osteoporosis issues   Hx of much steroids in past  Will reach out  to ortho about knee scan  Follow up.  Would like hematology to take another review  Of blood parameters and fam hx  As to any follow or other  intervention at thjis time   -Patient advised to return or notify health care team  if  new concerns arise.  Patient Instructions  Referring to get another opinion about the blood count findings  Stay on the sublingual   b12   1000 oer day .  Checking  Bone density   For general osteoporosis  Bone health .   I will message  Ortho about the scan .  Fu depending.          Standley Brooking. Alban Marucci M.D.

## 2020-03-20 NOTE — Patient Instructions (Addendum)
Referring to get another opinion about the blood count findings  Stay on the sublingual   b12   1000 oer day .  Checking  Bone density   For general osteoporosis  Bone health .   I will message  Ortho about the scan .  Fu depending.

## 2020-03-21 ENCOUNTER — Telehealth: Payer: Self-pay | Admitting: Hematology

## 2020-03-21 NOTE — Telephone Encounter (Signed)
Received a new hem referral from Dr. Regis Bill for elevated mcv and eosinophilia. [name] has been cld and scheduled to see Dr. Irene Limbo on 1/13 at 1pm. Pt aware toa rrive 30 minutes early.

## 2020-04-03 ENCOUNTER — Inpatient Hospital Stay: Admission: RE | Admit: 2020-04-03 | Payer: Medicare PPO | Source: Ambulatory Visit

## 2020-04-10 ENCOUNTER — Inpatient Hospital Stay: Admission: RE | Admit: 2020-04-10 | Payer: Medicare PPO | Source: Ambulatory Visit

## 2020-04-12 ENCOUNTER — Other Ambulatory Visit: Payer: Self-pay

## 2020-04-12 ENCOUNTER — Telehealth: Payer: Self-pay | Admitting: Hematology

## 2020-04-12 ENCOUNTER — Inpatient Hospital Stay: Payer: Medicare PPO | Attending: Hematology | Admitting: Hematology

## 2020-04-12 VITALS — BP 126/64 | HR 78 | Temp 97.7°F | Resp 18 | Ht 63.0 in | Wt 209.3 lb

## 2020-04-12 DIAGNOSIS — D7589 Other specified diseases of blood and blood-forming organs: Secondary | ICD-10-CM | POA: Diagnosis not present

## 2020-04-12 DIAGNOSIS — Z6837 Body mass index (BMI) 37.0-37.9, adult: Secondary | ICD-10-CM | POA: Diagnosis not present

## 2020-04-12 DIAGNOSIS — E669 Obesity, unspecified: Secondary | ICD-10-CM | POA: Insufficient documentation

## 2020-04-12 DIAGNOSIS — J45909 Unspecified asthma, uncomplicated: Secondary | ICD-10-CM | POA: Insufficient documentation

## 2020-04-12 DIAGNOSIS — D72119 Hypereosinophilic syndrome (hes), unspecified: Secondary | ICD-10-CM | POA: Diagnosis not present

## 2020-04-12 DIAGNOSIS — Z8269 Family history of other diseases of the musculoskeletal system and connective tissue: Secondary | ICD-10-CM | POA: Insufficient documentation

## 2020-04-12 DIAGNOSIS — Z7951 Long term (current) use of inhaled steroids: Secondary | ICD-10-CM | POA: Diagnosis not present

## 2020-04-12 DIAGNOSIS — Z8249 Family history of ischemic heart disease and other diseases of the circulatory system: Secondary | ICD-10-CM | POA: Insufficient documentation

## 2020-04-12 DIAGNOSIS — Z888 Allergy status to other drugs, medicaments and biological substances status: Secondary | ICD-10-CM | POA: Diagnosis not present

## 2020-04-12 DIAGNOSIS — I10 Essential (primary) hypertension: Secondary | ICD-10-CM | POA: Insufficient documentation

## 2020-04-12 DIAGNOSIS — D721 Eosinophilia, unspecified: Secondary | ICD-10-CM | POA: Insufficient documentation

## 2020-04-12 DIAGNOSIS — M1990 Unspecified osteoarthritis, unspecified site: Secondary | ICD-10-CM | POA: Diagnosis not present

## 2020-04-12 DIAGNOSIS — G4730 Sleep apnea, unspecified: Secondary | ICD-10-CM | POA: Diagnosis not present

## 2020-04-12 DIAGNOSIS — Z833 Family history of diabetes mellitus: Secondary | ICD-10-CM | POA: Insufficient documentation

## 2020-04-12 DIAGNOSIS — Z79899 Other long term (current) drug therapy: Secondary | ICD-10-CM | POA: Diagnosis not present

## 2020-04-12 NOTE — Progress Notes (Signed)
HEMATOLOGY/ONCOLOGY CONSULTATION NOTE  Date of Service: 04/12/2020  Patient Care Team: Burnis Medin, MD as PCP - General orthopedic Festus Aloe, MD as Attending Physician (Urology) Deneise Lever, MD as Attending Physician (Pulmonary Disease)  CHIEF COMPLAINTS/PURPOSE OF CONSULTATION:  Elevated MCV/Eosinophilia  HISTORY OF PRESENTING ILLNESS:   [name] is a wonderful 70 y.o. male who has been referred to [place] by Dr. Regis Bill for evaluation and management of elevated mcv and eosinophilia.   Patient was previously seen by me in 2018. 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 He notes that he has been a lifelong nonsmoker and quit using alcohol in 1980s.  Patient reports that he has had chronically elevated eosinophil counts for years.  His recent labs with his primary care physician and pulmonologist have shown normal total WBC counts, normal hemoglobin with some macrocytosis and MCV of 10.5, normal platelet count but elevated absolute eosinophil count but have ranged between 700 and 1100 from at least July 2013 with no pattern of progressive increase. He had one eosinophil value of 0 in February 2018 likely in the setting of a viral infection.  Patient has been on inhaled steroids for his asthma as well as nasal steroids for his seasonal allergies. He also had been on chronic leukotiene inhibitor therapy (Zakirlukast) for >10 yrs per his report - this itself can cause chronic eosinophilia. The use of aspirin-like NSAIDs can also trigger eosinophilia.  Patient has no skin rashes or fixed lesions. No lymphadenopathy. No fevers no chills no night sweats. No unexplained weight loss. Has had ANA positivity but no overt diagnosis of an autoimmune condition. No recent travel history outside the [place] for concerns of  parasitic infections. Patient notes that he has had a hernia repair and has a mesh in his abdominal wall.  His previous workup for eosinophilia with hypereosinophilic syndrome Fish panel with PDGFRA, PDGFRB, FGR1 , Chc2 deletion studies ruled out a clonal eosinophilia. His tryptase levels were WNL. No clinical symptoms of lymphoma.  His eosinophilia previously has been as high as 1500-2000. Last CBC on 03/13/2020 shows normal total WBC with borderline   eosinophil counts of 616 with normal Hgb and platelet counts,  NO focal symptoms suggestive of malignancy, organ specific symptoms. No fevers/chills/night sweats. No skin rashes, new diarrhea or change in breathing.  He notes he feelng overall much better than when we last met.  MEDICAL HISTORY:  Past Medical History:  Diagnosis Date  . Allergy   . Arthritis    knees  . Asthma   . Depression   . Elevated PSA    [place] and bx negative 2011  . Hypertension    nl cardiolite in 1998  . Pneumonia 2005   right hospitalized   . Rhinosinusitis   . Sleep apnea    wears CPAP    SURGICAL HISTORY: Past Surgical History:  Procedure Laterality Date  . COLONOSCOPY  03/09/2013  . FACIAL RECONSTRUCTION SURGERY    . NASAL SINUS SURGERY    . 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 shoulder atrhroscopic surgery  2014   Right  . VENTRAL HERNIA REPAIR  03-2007   rerepair winter 2010    SOCIAL HISTORY: Social History   Socioeconomic History  . Marital status: Single    Spouse name: Not on file  .  Number of children: Not on file  . Years of education: Not on file  . Highest education level: Not on file  Occupational History  . 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 Smoking status: Never Smoker  . Smokeless tobacco: Never Used  Vaping Use  . Vaping Use: Never used  Substance and Sexual Activity  . Alcohol use: No    Comment: Quit drinking ETOH 1986  . Drug use: No   . Sexual activity: Yes  Other Topics Concern  . 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 Heart failure Father        MV replacement  . Myelodysplastic syndrome Father   . Asthma Mother   . Polycythemia Brother   . Asthma Other   . Hypertension Other   . Diabetes Maternal Grandmother   . Healthy Sister   . Healthy Brother   . Healthy Brother   . Colon cancer Neg Hx   . Pancreatic cancer Neg Hx   . Stomach cancer Neg Hx   . Esophageal cancer Neg Hx   . Colon polyps Neg Hx   . Rectal cancer Neg Hx     ALLERGIES:  is allergic to eggs or egg-derived products and tape.  MEDICATIONS:  Current Outpatient Medications  Medication Sig Dispense Refill  . amLODipine-benazepril (LOTREL) 5-10 MG capsule Take 1 capsule by mouth daily. TAKE 1 CAPSULE BY MOUTH EVERY DAY 90 capsule 1  . amoxicillin (AMOXIL) 500 MG capsule     . azelastine (ASTELIN) 0.1 % nasal spray Place 1-2 sprays into both nostrils 2 (two) times daily as needed for rhinitis. Use in each nostril as directed 30 mL 12  . clobetasol (OLUX) 0.05 % topical foam Apply topically as needed.    . COMBIVENT RESPIMAT 20-100 MCG/ACT AERS respimat INHALE 2 PUFFS UP TO FOUR TIMES A DAY AS NEEDED 1 each 5  . escitalopram (LEXAPRO) 10 MG tablet TAKE 1 TABLET BY MOUTH EVERY DAY 90 tablet 1  . fexofenadine (ALLEGRA) 180 MG tablet Take 180 mg by mouth daily.    . fluticasone (FLONASE) 50 MCG/ACT nasal spray Place 2 sprays into both nostrils daily. 16 g prn  . guaiFENesin (MUCINEX) 600 MG 12 hr  tablet Take 1,200 mg by mouth daily.    Marland Kitchen ibuprofen (ADVIL) 800 MG tablet TAKE 1 TABLET BY MOUTH EVERY 8 HOURS AS NEEDED 60 tablet 1  . silodosin (RAPAFLO) 8 MG CAPS capsule Take 1 capsule (8 mg total) by mouth at bedtime. 30 capsule 11  . triamcinolone cream (KENALOG) 0.1 % Apply 1 application topically as needed.    Grant Ruts INHUB 100-50 MCG/DOSE AEPB TAKE 1 PUFF BY MOUTH TWICE A DAY 60 each 6   No current facility-administered medications for this visit.    REVIEW OF SYSTEMS:    10 Point review of Systems was done is negative except as noted above.  PHYSICAL EXAMINATION: ECOG  PERFORMANCE STATUS: 2 - Symptomatic, <50% confined to bed  . Vitals:   04/12/20 1300  BP: 126/64  Pulse: 78  Resp: 18  Temp: 97.7 F (36.5 C)   Filed Weights   04/12/20 1300  Weight: 209 lb 4.8 oz (94.9 kg)   .Body mass index is 37.08 kg/m.  NAD GENERAL:alert, in no acute distress and comfortable SKIN: no acute rashes, no significant lesions EYES: conjunctiva are pink and non-injected, sclera anicteric OROPHARYNX: MMM, no exudates, no oropharyngeal erythema or ulceration NECK: supple, no JVD LYMPH:  no palpable lymphadenopathy in the cervical, axillary or inguinal regions LUNGS: clear to auscultation b/l with normal respiratory effort HEART: regular rate & rhythm ABDOMEN:  normoactive bowel sounds , non tender, not distended. Extremity: no pedal edema PSYCH: alert & oriented x 3 with fluent speech NEURO: no focal motor/sensory deficits  LABORATORY DATA:  I have reviewed the data as listed  . CBC Latest Ref Rng & Units 03/13/2020 11/07/2019 06/30/2019  WBC 3.8 - 10.8 Thousand/uL 5.4 7.2 7.3  Hemoglobin 13.2 - 17.1 g/dL 14.4 14.0 13.7  Hematocrit 38.5 - 50.0 % 41.8 40.4 38.8(L)  Platelets 140 - 400 Thousand/uL 152 169 160.0    . CMP Latest Ref Rng & Units 06/30/2019 01/28/2018 11/07/2016  Glucose 70 - 99 mg/dL 85 108(H) 85  BUN 6 - 23 mg/dL _0 Creatinine 0.40 - 1.50 mg/dL 0.71 0.79  0.76  Sodium 135 - 145 mEq/L 134(L) 134(L) 135  Potassium 3.5 - 5.1 mEq/L 4.2 4.7 4.6  Chloride 96 - 112 mEq/L 100 99 100  CO2 19 - 32 mEq/L _1 Calcium 8.4 - 10.5 mg/dL 8.8 9.5 9.0  Total Protein 6.0 - 8.3 g/dL 6.1 6.8 6.5  Total Bilirubin 0.2 - 1.2 mg/dL 0.8 0.9 0.8  Alkaline Phos 39 - 117 U/L 55 70 65  AST 0 - 37 U/L _2 ALT 0 - 53 U/L _3 MPN w/Hypereosinophilia FISH Order: [id number]  Status: Edited Result - FINAL   Visible to patient: Yes (seen)   Next appt: 04/24/2020 at 10:00 AM in Radiology ([facility] DEXA 1)   Dx: Eosinophilia   1 Result Note  Component 3 yr ago  MPN Specimen Type BLOOD   MPN Cells Counted Comment:   Comment: 80-100/pro  MPN Cells Analyzed Comment:   Comment: 80-100/pro  MPN FISH Result Comment:   Comment: NO DELETION OF CHIC2/LNX OR REARRANGEMENT OF PDGFRA, PDGFRB,  OR FGFR1 OBSERVED      MPN w/Hypereosinophilia FISH Order: [id number]  Status: Edited Result - FINAL   Visible to patient: Yes (seen)   Next appt: 04/24/2020 at 10:00 AM in Radiology ([facility] DEXA 1)   Dx: Eosinophilia   1 Result Note  Component 3 yr ago  MPN Specimen Type BLOOD   MPN Cells Counted Comment:   Comment: 80-100/pro  MPN Cells Analyzed Comment:   Comment: 80-100/pro  MPN FISH Result Comment:   Comment: NO DELETION OF CHIC2/LNX OR REARRANGEMENT OF PDGFRA, PDGFRB,  OR FGFR1 OBSERVED        RADIOGRAPHIC STUDIES: I have personally reviewed the radiological images as listed and agreed with the findings in the report. No results found.  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 No associated leukocytosis, red blood cell or platelet disorder.  This appears to likely be reactive and can be due to chronic use of  Zafirlukast/Leukotriene inhibitors. It could also be related to other allergies Or hypersensitivity to other medications that are being used  including NSAIDs.  Eosinophil counts today are about 600 improved from last visit.  This is unlikely to represent a clonal eosinophilia/chronic eosinophilic leukemia/eosinophilia related to a lymphoproliferative state or myeloproliferative state.  No associated other leukocytosis. neg Fish panel for PDGFRA, PDGFRB, FGR1 , Chc2 deletion studies which rule out a clonal eosinophilia. tryptase level [previously wnl - suggesting against a mast cell disorder. PLAN - all the labs results and clinically considerations discussed with patient. -No clinical evidence of myeloproliferative or lymphoproliferative disorder. -No overt indication for bone marrow biopsy at this time would need to consider this if he has progressive eosinophilia or other CBC abnormalities.His eosinophil counts are actually improved from previously and he has no new focal symptoms.  #2 Macrocytosis without anemia - likely medication related -recent B12 levels wnl @ 553 and folated in 10/2019 - wnl Plan -cannot r/o MDS -if the patient is developing more anemia might need consideration for ruling out MDS with BM Bx -patient prefers a conservative approach to evaluation at this time and we shall monitor his labs again in 6 months   FOLLOW UP: RTC with Dr Irene Limbo with labs in 6 months   All of the patients questions were answered with apparent satisfaction. The patient knows to call the clinic with any problems, questions or concerns.  I spent 30 mins counseling the patient face to face. The total time spent in the appointment was 40 mins and more than 50% was on counseling and direct patient cares.    Sullivan Lone MD [place] AAHIVMS [facility] Hematology/Oncology Physician [facility]  (Office):       [phone] (Work cell):  [phone] (Fax):           [phone]  04/12/2020 2:56 AM  I, Yevette Edwards, am acting as a scribe for Dr. Sullivan Lone.   .I have reviewed the above documentation for accuracy  and completeness, and I agree with the above. Brunetta Genera MD

## 2020-04-12 NOTE — Telephone Encounter (Signed)
Scheduled appointments per 1/13 los. Spoke to patient who is aware of appointments dates and times.  

## 2020-04-23 DIAGNOSIS — Z96652 Presence of left artificial knee joint: Secondary | ICD-10-CM | POA: Diagnosis not present

## 2020-04-23 DIAGNOSIS — F411 Generalized anxiety disorder: Secondary | ICD-10-CM | POA: Diagnosis not present

## 2020-04-23 DIAGNOSIS — T8454XA Infection and inflammatory reaction due to internal left knee prosthesis, initial encounter: Secondary | ICD-10-CM | POA: Diagnosis not present

## 2020-04-23 DIAGNOSIS — T8484XA Pain due to internal orthopedic prosthetic devices, implants and grafts, initial encounter: Secondary | ICD-10-CM | POA: Diagnosis not present

## 2020-04-24 ENCOUNTER — Ambulatory Visit (INDEPENDENT_AMBULATORY_CARE_PROVIDER_SITE_OTHER)
Admission: RE | Admit: 2020-04-24 | Discharge: 2020-04-24 | Disposition: A | Discharge status: Home / Self Care | Payer: Medicare PPO | Source: Ambulatory Visit | Attending: Internal Medicine | Admitting: Internal Medicine

## 2020-04-24 ENCOUNTER — Other Ambulatory Visit: Payer: Self-pay

## 2020-04-24 DIAGNOSIS — M898X9 Other specified disorders of bone, unspecified site: Secondary | ICD-10-CM

## 2020-04-24 DIAGNOSIS — M8580 Other specified disorders of bone density and structure, unspecified site: Secondary | ICD-10-CM

## 2020-04-24 DIAGNOSIS — Z92241 Personal history of systemic steroid therapy: Secondary | ICD-10-CM

## 2020-04-27 DIAGNOSIS — G4733 Obstructive sleep apnea (adult) (pediatric): Secondary | ICD-10-CM | POA: Diagnosis not present

## 2020-05-01 NOTE — Progress Notes (Signed)
Bone density is in normal range  no increase risk of fracture  noted

## 2020-05-28 DIAGNOSIS — F411 Generalized anxiety disorder: Secondary | ICD-10-CM | POA: Diagnosis not present

## 2020-06-01 NOTE — Progress Notes (Addendum)
Subjective:   [name] is a 70 y.o. male who presents for an Initial Medicare Annual Wellness Visit.  Virtual Visit via Video Note  I connected with [name] by a video enabled telemedicine application and verified that I am speaking with the correct person using two identifiers.  Location: Patient: Home Provider: Office Persons participating in the virtual visit: patient, provider   I discussed the limitations of evaluation and management by telemedicine and the availability of in person appointments. The patient expressed understanding and agreed to proceed.     [name] [name] Sievers,LPN    Review of Systems    N/A  Cardiac Risk Factors include: advanced age (>18men, >7 women);male gender;hypertension     Objective:    Today's Vitals   There is no height or weight on file to calculate BMI.  Advanced Directives 06/05/2020 07/24/2016  Does Patient Have a Medical Advance Directive? No No  Would patient like information on creating a medical advance directive? No - Patient declined -    Current Medications (verified) Outpatient Encounter Medications as of 06/05/2020  Medication Sig  . amLODipine-benazepril (LOTREL) 5-10 MG capsule Take 1 capsule by mouth daily. TAKE 1 CAPSULE BY MOUTH EVERY DAY  . azelastine (ASTELIN) 0.1 % nasal spray Place 1-2 sprays into both nostrils 2 (two) times daily as needed for rhinitis. Use in each nostril as directed  . clobetasol (OLUX) 0.05 % topical foam Apply topically as needed.  . COMBIVENT RESPIMAT 20-100 MCG/ACT AERS respimat INHALE 2 PUFFS UP TO FOUR TIMES A DAY AS NEEDED  . escitalopram (LEXAPRO) 10 MG tablet TAKE 1 TABLET BY MOUTH EVERY DAY  . fexofenadine (ALLEGRA) 180 MG tablet Take 180 mg by mouth daily.  . fluticasone (FLONASE) 50 MCG/ACT nasal spray Place 2 sprays into both nostrils daily.  Marland Kitchen guaiFENesin (MUCINEX) 600 MG 12 hr tablet Take 1,200 mg by mouth daily.  Marland Kitchen ibuprofen (ADVIL) 800 MG tablet TAKE 1 TABLET BY MOUTH  EVERY 8 HOURS AS NEEDED  . silodosin (RAPAFLO) 8 MG CAPS capsule Take 1 capsule (8 mg total) by mouth at bedtime.  . triamcinolone cream (KENALOG) 0.1 % Apply 1 application topically as needed.  Grant Ruts INHUB 100-50 MCG/DOSE AEPB TAKE 1 PUFF BY MOUTH TWICE A DAY  . [DISCONTINUED] amoxicillin (AMOXIL) 500 MG capsule    No facility-administered encounter medications on file as of 06/05/2020.    Allergies (verified) Eggs or egg-derived products and Tape   History: Past Medical History:  Diagnosis Date  . Allergy   . Arthritis    knees  . Asthma   . Depression   . Elevated PSA    [place] and bx negative 2011  . Hypertension    nl cardiolite in 1998  . Pneumonia 2005   right hospitalized   . Rhinosinusitis   . Sleep apnea    wears CPAP   Past Surgical History:  Procedure Laterality Date  . COLONOSCOPY  03/09/2013  . FACIAL RECONSTRUCTION SURGERY    . NASAL SINUS SURGERY    . 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 shoulder atrhroscopic surgery  2014   Right  . VENTRAL HERNIA REPAIR  03-2007   rerepair winter 2010   Family History  Problem Relation Age of Onset  . Heart failure Father        MV replacement  . Myelodysplastic syndrome Father   . Asthma Mother   . Polycythemia Brother   . Asthma  Other   . Hypertension Other   . Diabetes Maternal Grandmother   . Healthy Sister   . Healthy Brother   . Healthy Brother   . Colon cancer Neg Hx   . Pancreatic cancer Neg Hx   . Stomach cancer Neg Hx   . Esophageal cancer Neg Hx   . Colon polyps Neg Hx   . Rectal cancer Neg Hx    Social History   Socioeconomic History  . Marital status: Single    Spouse name: Not on file  . Number of children: Not on file  . Years of education: Not on file  . Highest education level: Not on file  Occupational History  . 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 Smoking status: Never Smoker  . Smokeless tobacco: Never  Used  Vaping Use  . Vaping Use: Never used  Substance and Sexual Activity  . Alcohol use: No    Comment: Quit drinking ETOH 1986  . Drug use: No  . Sexual activity: Yes  Other Topics Concern  . 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 Difficulty of Paying Living Expenses: Not hard at all  Food Insecurity: No Food Insecurity  . Worried About Charity fundraiser in the Last Year: Never true  . Ran Out of Food in the Last Year: Never true  Transportation Needs: No Transportation Needs  . Lack of Transportation (Medical): No  . Lack of Transportation (Non-Medical): No  Physical Activity: Sufficiently Active  . Days of Exercise per Week: 5 days  . Minutes of Exercise per Session: 60 min  Stress: No Stress Concern Present  . Feeling of Stress : Not at all  Social Connections: Moderately Isolated  . Frequency of Communication with Friends and Family: More than three times a week  . Frequency of Social Gatherings with Friends and Family: More than three times a week  . Attends Religious Services: Never  . Active Member of Clubs or Organizations: Yes  . Attends Archivist Meetings: Never  . Marital Status: Divorced    Tobacco Counseling Counseling given: Not Answered   Clinical Intake:  Pre-visit preparation completed: Yes  Pain : No/denies pain     Nutritional Risks: None Diabetes: No  How often do you need to have someone help you when you read instructions, pamphlets, or other written materials from your doctor or pharmacy?: 1 - Never  Diabetic?No   Interpreter Needed?: No  Information entered by :: [place] of Daily Living In your present state of health, do you have any difficulty performing the following activities: 06/05/2020  Hearing? N  Vision? N   Difficulty concentrating or making decisions? N  Walking or climbing stairs? N  Dressing or bathing? N  Doing errands, shopping? N  Preparing Food and eating ? N  Using the Toilet? N  In the past six months, have you accidently leaked urine? N  Do you have problems with loss of bowel control? N  Managing your Medications? N  Managing your Finances? N  Housekeeping or managing your Housekeeping? N  Some recent data might be hidden    Patient Care Team: Panosh, Standley Brooking, MD as PCP -  General orthopedic Festus Aloe, MD as Attending Physician (Urology) Deneise Lever, MD as Attending Physician (Pulmonary Disease)  Indicate any recent Medical Services you may have received from other than [facility] providers in the past year (date may be approximate).     Assessment:   This is a routine wellness examination for [place].  Hearing/Vision screen  Hearing Screening   125Hz  250Hz  500Hz  1000Hz  2000Hz  3000Hz  4000Hz  6000Hz  8000Hz   Right ear:           Left ear:           Vision Screening Comments: Gets eye exams once per year. Wears glasses   Dietary issues and exercise activities discussed: Current Exercise Habits: Structured exercise class, Type of exercise: treadmill;strength training/weights, Time (Minutes): 60, Frequency (Times/Week): 5, Weekly Exercise (Minutes/Week): 300  Goals    . Exercise 150 min/wk Moderate Activity     Patient wants to work more on my cardio more, and increase mobility    . Patient Stated     I would like to scuba dive more!       Depression Screen PHQ 2/9 Scores 06/05/2020 07/08/2019  PHQ - 2 Score 0 0  PHQ- 9 Score - 0    Fall Risk Fall Risk  06/05/2020 07/08/2019 11/07/2016  Falls in the past year? 0 0 No  Number falls in past yr: 0 - -  Injury with Fall? 0 - -  Risk for fall due to : No Fall Risks - -  Follow up Falls evaluation completed;Falls prevention discussed - -    FALL RISK PREVENTION PERTAINING TO THE HOME:  Any stairs in or around  the home? No  If so, are there any without handrails? No  Home free of loose throw rugs in walkways, pet beds, electrical cords, etc? Yes  Adequate lighting in your home to reduce risk of falls? Yes   ASSISTIVE DEVICES UTILIZED TO PREVENT FALLS:  Life alert? No  Use of a cane, walker or w/c? No  Grab bars in the bathroom? No  Shower chair or bench in shower? No  Elevated toilet seat or a handicapped toilet? No     Cognitive Function:   Normal cognitive status assessed by direct observation by this Nurse Health Advisor. No abnormalities found.        Immunizations Immunization History  Administered Date(s) Administered  . Fluad Quad(high Dose 65+) 01/11/2019, 01/06/2020  . H1N1 04/03/2008  . Influenza Split 04/08/2011, 02/19/2012, 12/27/2015  . Influenza Whole 02/02/2007, 03/14/2009, 01/28/2010  . Influenza,inj,Quad PF,6+ Mos 12/01/2012, 12/16/2013, 12/20/2014, 12/30/2016, 01/05/2018  . Influenza,inj,quad, With Preservative 12/29/2016  . Moderna Sars-Covid-2 Vaccination 05/02/2019, 05/31/2019, 01/22/2020  . Pneumococcal Conjugate-13 10/19/2013  . Pneumococcal Polysaccharide-23 09/17/2009, 11/07/2016  . Td 09/17/2009  . Zoster 11/02/2015  . Zoster Recombinat (Shingrix) 11/07/2016, 02/18/2017    TDAP status: Due, Education has been provided regarding the importance of this vaccine. Advised may receive this vaccine at local pharmacy or Health Dept. Aware to provide a copy of the vaccination record if obtained from local pharmacy or Health Dept. Verbalized acceptance and understanding.  Flu Vaccine status: Up to date  Pneumococcal vaccine status: Up to date  Covid-19 vaccine status: Completed vaccines  Qualifies for Shingles Vaccine? Yes   Zostavax completed Yes   Shingrix Completed?: Yes  Screening Tests Health Maintenance  Topic Date Due  . COLONOSCOPY (Pts 45-23yrs Insurance coverage will need to be confirmed)  06/24/2022  . TETANUS/TDAP  09/09/2027  . INFLUENZA  VACCINE  Completed  .  COVID-19 Vaccine  Completed  . Hepatitis C Screening  Completed  . PNA vac Low Risk Adult  Completed  . HPV VACCINES  Aged Out    Health Maintenance  There are no preventive care reminders to display for this patient.  Colorectal cancer screening: Type of screening: Colonoscopy. Completed 06/22/2019. Repeat every 3 years  Lung Cancer Screening: (Low Dose CT Chest recommended if Age 60-80 years, 30 pack-year currently smoking OR have quit w/in 15years.) does not qualify.   Lung Cancer Screening Referral: N/A   Additional Screening:  Hepatitis C Screening: does qualify; Completed 01/02/2016  Vision Screening: Recommended annual ophthalmology exams for early detection of glaucoma and other disorders of the eye. Is the patient up to date with their annual eye exam?  Yes  Who is the provider or what is the name of the office in which the patient attends annual eye exams? Dr. Gershon Crane  If pt is not established with a provider, would they like to be referred to a provider to establish care? No .   Dental Screening: Recommended annual dental exams for proper oral hygiene  Community Resource Referral / Chronic Care Management: CRR required this visit?  No   CCM required this visit?  No      Plan:     I have personally reviewed and noted the following in the patient's chart:   . Medical and social history . Use of alcohol, tobacco or illicit drugs  . Current medications and supplements . Functional ability and status . Nutritional status . Physical activity . Advanced directives . List of other physicians . Hospitalizations, surgeries, and ER visits in previous 12 months . Vitals . Screenings to include cognitive, depression, and falls . Referrals and appointments  In addition, I have reviewed and discussed with patient certain preventive protocols, quality metrics, and best practice recommendations. A written personalized care plan for preventive  services as well as general preventive health recommendations were provided to patient.     Ofilia Neas, LPN   9/0/9311   Nurse Notes: None  Above noted reviewed and agree. Shanon Ace, MD

## 2020-06-05 ENCOUNTER — Ambulatory Visit (INDEPENDENT_AMBULATORY_CARE_PROVIDER_SITE_OTHER): Payer: Medicare PPO

## 2020-06-05 DIAGNOSIS — Z0000 Encounter for general adult medical examination without abnormal findings: Secondary | ICD-10-CM

## 2020-06-05 NOTE — Patient Instructions (Signed)
[name] , Thank you for taking time to come for your Medicare Wellness Visit. I appreciate your ongoing commitment to your health goals. Please review the following plan we discussed and let me know if I can assist you in the future.   Screening recommendations/referrals: Colonoscopy: Up to date, next due 06/22/2022 Recommended yearly ophthalmology/optometry visit for glaucoma screening and checkup Recommended yearly dental visit for hygiene and checkup  Vaccinations: Influenza vaccine: Up to date, next due fall 2022  Pneumococcal vaccine: completed series  Tdap vaccine: Currently due, you may await and injury to receive as it will be covered by medicare at that time. Shingles vaccine: Completed series     Advanced directives: Advance directive discussed with you today. Even though you declined this today please call our office should you change your mind and we can give you the proper paperwork for you to fill out.   Conditions/risks identified: None   Next appointment: 06/06/2021 @ 9:00 am with [name] 69 Years and Older, Male Preventive care refers to lifestyle choices and visits with your health care provider that can promote health and wellness. What does preventive care include?  A yearly physical exam. This is also called an annual well check.  Dental exams once or twice a year.  Routine eye exams. Ask your health care provider how often you should have your eyes checked.  Personal lifestyle choices, in/cluding:  Daily care of your teeth and gums.  Regular physical activity.  Eating a healthy diet.  Avoiding tobacco and drug use.  Limiting alcohol use.  Practicing safe sex.  Taking low doses of aspirin every day.  Taking vitamin and mineral supplements as recommended by your health care provider. What happens during an annual well check? The services and screenings done by your health care provider during your annual well check  will depend on your age, overall health, lifestyle risk factors, and family history of disease. Counseling  Your health care provider may ask you questions about your:  Alcohol use.  Tobacco use.  Drug use.  Emotional well-being.  Home and relationship well-being.  Sexual activity.  Eating habits.  History of falls.  Memory and ability to understand (cognition).  Work and work Statistician. Screening  You may have the following tests or measurements:  Height, weight, and BMI.  Blood pressure.  Lipid and cholesterol levels. These may be checked every 5 years, or more frequently if you are over 69 years old.  Skin check.  Lung cancer screening. You may have this screening every year starting at age 69 if you have a 30-pack-year history of smoking and currently smoke or have quit within the past 15 years.  Fecal occult blood test (FOBT) of the stool. You may have this test every year starting at age 69.  Flexible sigmoidoscopy or colonoscopy. You may have a sigmoidoscopy every 5 years or a colonoscopy every 10 years starting at age 69.  Prostate cancer screening. Recommendations will vary depending on your family history and other risks.  Hepatitis C blood test.  Hepatitis B blood test.  Sexually transmitted disease (STD) testing.  Diabetes screening. This is done by checking your blood sugar (glucose) after you have not eaten for a while (fasting). You may have this done every 1-3 years.  Abdominal aortic aneurysm (AAA) screening. You may need this if you are a current or former smoker.  Osteoporosis. You may be screened starting at age 69 if you are at high  risk. Talk with your health care provider about your test results, treatment options, and if necessary, the need for more tests. Vaccines  Your health care provider may recommend certain vaccines, such as:  Influenza vaccine. This is recommended every year.  Tetanus, diphtheria, and acellular pertussis  (Tdap, Td) vaccine. You may need a Td booster every 10 years.  Zoster vaccine. You may need this after age 69.  Pneumococcal 13-valent conjugate (PCV13) vaccine. One dose is recommended after age 69.  Pneumococcal polysaccharide (PPSV23) vaccine. One dose is recommended after age 69. Talk to your health care provider about which screenings and vaccines you need and how often you need them. This information is not intended to replace advice given to you by your health care provider. Make sure you discuss any questions you have with your health care provider. Document Released: 04/13/2015 Document Revised: 12/05/2015 Document Reviewed: 01/16/2015 Elsevier Interactive Patient Education  2017 [employer] Prevention in the Home Falls can cause injuries. They can happen to people of all ages. There are many things you can do to make your home safe and to help prevent falls. What can I do on the outside of my home?  Regularly fix the edges of walkways and driveways and fix any cracks.  Remove anything that might make you trip as you walk through a door, such as a raised step or threshold.  Trim any bushes or trees on the path to your home.  Use bright outdoor lighting.  Clear any walking paths of anything that might make someone trip, such as rocks or tools.  Regularly check to see if handrails are loose or broken. Make sure that both sides of any steps have handrails.  Any raised decks and porches should have guardrails on the edges.  Have any leaves, snow, or ice cleared regularly.  Use sand or salt on walking paths during winter.  Clean up any spills in your garage right away. This includes oil or grease spills. What can I do in the bathroom?  Use night lights.  Install grab bars by the toilet and in the tub and shower. Do not use towel bars as grab bars.  Use non-skid mats or decals in the tub or shower.  If you need to sit down in the shower, use a plastic, non-slip  stool.  Keep the floor dry. Clean up any water that spills on the floor as soon as it happens.  Remove soap buildup in the tub or shower regularly.  Attach bath mats securely with double-sided non-slip rug tape.  Do not have throw rugs and other things on the floor that can make you trip. What can I do in the bedroom?  Use night lights.  Make sure that you have a light by your bed that is easy to reach.  Do not use any sheets or blankets that are too big for your bed. They should not hang down onto the floor.  Have a firm chair that has side arms. You can use this for support while you get dressed.  Do not have throw rugs and other things on the floor that can make you trip. What can I do in the kitchen?  Clean up any spills right away.  Avoid walking on wet floors.  Keep items that you use a lot in easy-to-reach places.  If you need to reach something above you, use a strong step stool that has a grab bar.  Keep electrical cords out of the way.  Do not use floor polish or wax that makes floors slippery. If you must use wax, use non-skid floor wax.  Do not have throw rugs and other things on the floor that can make you trip. What can I do with my stairs?  Do not leave any items on the stairs.  Make sure that there are handrails on both sides of the stairs and use them. Fix handrails that are broken or loose. Make sure that handrails are as long as the stairways.  Check any carpeting to make sure that it is firmly attached to the stairs. Fix any carpet that is loose or worn.  Avoid having throw rugs at the top or bottom of the stairs. If you do have throw rugs, attach them to the floor with carpet tape.  Make sure that you have a light switch at the top of the stairs and the bottom of the stairs. If you do not have them, ask someone to add them for you. What else can I do to help prevent falls?  Wear shoes that:  Do not have high heels.  Have rubber bottoms.  Are  comfortable and fit you well.  Are closed at the toe. Do not wear sandals.  If you use a stepladder:  Make sure that it is fully opened. Do not climb a closed stepladder.  Make sure that both sides of the stepladder are locked into place.  Ask someone to hold it for you, if possible.  Clearly mark and make sure that you can see:  Any grab bars or handrails.  First and last steps.  Where the edge of each step is.  Use tools that help you move around (mobility aids) if they are needed. These include:  Canes.  Walkers.  Scooters.  Crutches.  Turn on the lights when you go into a dark area. Replace any light bulbs as soon as they burn out.  Set up your furniture so you have a clear path. Avoid moving your furniture around.  If any of your floors are uneven, fix them.  If there are any pets around you, be aware of where they are.  Review your medicines with your doctor. Some medicines can make you feel dizzy. This can increase your chance of falling. Ask your doctor what other things that you can do to help prevent falls. This information is not intended to replace advice given to you by your health care provider. Make sure you discuss any questions you have with your health care provider. Document Released: 01/11/2009 Document Revised: 08/23/2015 Document Reviewed: 04/21/2014 Elsevier Interactive Patient Education  2017 [employer].

## 2020-06-19 DIAGNOSIS — L821 Other seborrheic keratosis: Secondary | ICD-10-CM | POA: Diagnosis not present

## 2020-06-19 DIAGNOSIS — Z85828 Personal history of other malignant neoplasm of skin: Secondary | ICD-10-CM | POA: Diagnosis not present

## 2020-06-19 DIAGNOSIS — D225 Melanocytic nevi of trunk: Secondary | ICD-10-CM | POA: Diagnosis not present

## 2020-06-19 DIAGNOSIS — L718 Other rosacea: Secondary | ICD-10-CM | POA: Diagnosis not present

## 2020-06-19 DIAGNOSIS — L309 Dermatitis, unspecified: Secondary | ICD-10-CM | POA: Diagnosis not present

## 2020-06-19 DIAGNOSIS — D692 Other nonthrombocytopenic purpura: Secondary | ICD-10-CM | POA: Diagnosis not present

## 2020-06-25 ENCOUNTER — Other Ambulatory Visit: Payer: Self-pay | Admitting: Internal Medicine

## 2020-06-25 DIAGNOSIS — F411 Generalized anxiety disorder: Secondary | ICD-10-CM | POA: Diagnosis not present

## 2020-07-10 MED ORDER — PREDNISONE 10 MG PO TABS: ORAL_TABLET | ORAL | 0 refills | Status: DC

## 2020-07-10 NOTE — Telephone Encounter (Signed)
Received the following message from patient:   "I hesitate to send this message but I am having occasional wheezing due, I think, to all the pollen we currently have. I am leaving Friday to [facility] for scuba diving April 18-21 and I want to be wheeze-free. My next appointment with Dr. Annamaria Boots isn't until June 22 and I am willing to come in if necessary but would it be possible to get a short course of prednisone to calm this down? I have been through this before and prednisone works well. Thanks."  CY, can you please advise? Thanks!

## 2020-07-10 NOTE — Telephone Encounter (Signed)
Please send prednisone 10 mg, # 20, 4 X 2 DAYS, 3 X 2 DAYS, 2 X 2 DAYS, 1 X 2 DAYS

## 2020-07-22 ENCOUNTER — Other Ambulatory Visit: Payer: Self-pay | Admitting: Internal Medicine

## 2020-07-31 DIAGNOSIS — H2513 Age-related nuclear cataract, bilateral: Secondary | ICD-10-CM | POA: Diagnosis not present

## 2020-07-31 DIAGNOSIS — H25013 Cortical age-related cataract, bilateral: Secondary | ICD-10-CM | POA: Diagnosis not present

## 2020-07-31 DIAGNOSIS — H5213 Myopia, bilateral: Secondary | ICD-10-CM | POA: Diagnosis not present

## 2020-07-31 DIAGNOSIS — H524 Presbyopia: Secondary | ICD-10-CM | POA: Diagnosis not present

## 2020-08-06 DIAGNOSIS — F411 Generalized anxiety disorder: Secondary | ICD-10-CM | POA: Diagnosis not present

## 2020-08-08 DIAGNOSIS — R972 Elevated prostate specific antigen [PSA]: Secondary | ICD-10-CM | POA: Diagnosis not present

## 2020-08-10 DIAGNOSIS — F411 Generalized anxiety disorder: Secondary | ICD-10-CM | POA: Diagnosis not present

## 2020-08-14 DIAGNOSIS — G4733 Obstructive sleep apnea (adult) (pediatric): Secondary | ICD-10-CM | POA: Diagnosis not present

## 2020-08-15 DIAGNOSIS — N401 Enlarged prostate with lower urinary tract symptoms: Secondary | ICD-10-CM | POA: Diagnosis not present

## 2020-08-15 DIAGNOSIS — R972 Elevated prostate specific antigen [PSA]: Secondary | ICD-10-CM | POA: Diagnosis not present

## 2020-08-15 DIAGNOSIS — R3912 Poor urinary stream: Secondary | ICD-10-CM | POA: Diagnosis not present

## 2020-08-20 ENCOUNTER — Other Ambulatory Visit: Payer: Self-pay | Admitting: Internal Medicine

## 2020-08-22 ENCOUNTER — Other Ambulatory Visit: Payer: Self-pay | Admitting: Internal Medicine

## 2020-08-30 ENCOUNTER — Other Ambulatory Visit: Payer: Self-pay | Admitting: Internal Medicine

## 2020-09-15 ENCOUNTER — Other Ambulatory Visit: Payer: Self-pay | Admitting: Internal Medicine
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
## 2020-09-19 ENCOUNTER — Other Ambulatory Visit: Payer: Self-pay | Admitting: Internal Medicine

## 2020-09-19 ENCOUNTER — Other Ambulatory Visit: Payer: Self-pay

## 2020-09-19 ENCOUNTER — Encounter: Payer: Self-pay | Admitting: Internal Medicine

## 2020-09-19 ENCOUNTER — Ambulatory Visit: Payer: Medicare PPO | Admitting: Internal Medicine

## 2020-09-19 DIAGNOSIS — J4520 Mild intermittent asthma, uncomplicated: Secondary | ICD-10-CM

## 2020-09-19 DIAGNOSIS — D696 Thrombocytopenia, unspecified: Secondary | ICD-10-CM

## 2020-09-19 DIAGNOSIS — G4733 Obstructive sleep apnea (adult) (pediatric): Secondary | ICD-10-CM | POA: Diagnosis not present

## 2020-09-19 NOTE — Assessment & Plan Note (Signed)
Benefits from CPAP with good long-term compliance and control, Plan- continue auto 10-20/ [employer]

## 2020-09-19 NOTE — Assessment & Plan Note (Signed)
Exacerbation in April associated with heavy Spring pollen season, responded well to prednisone taper. We have discussed safety of his Scuba diving with asthma hx and he accepts. Very careful to be clear and also well-medicated before diving.

## 2020-09-19 NOTE — Patient Instructions (Signed)
Glad you are doing well  We can continue CPAP auto 10-20 and current meds  Please call if we can help
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
## 2020-09-29 ENCOUNTER — Other Ambulatory Visit: Payer: Self-pay | Admitting: Internal Medicine

## 2020-10-08 DIAGNOSIS — F411 Generalized anxiety disorder: Secondary | ICD-10-CM | POA: Diagnosis not present

## 2020-10-10 ENCOUNTER — Other Ambulatory Visit: Payer: Medicare PPO

## 2020-10-10 ENCOUNTER — Ambulatory Visit: Payer: Medicare PPO | Admitting: Hematology
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
## 2020-10-15 ENCOUNTER — Inpatient Hospital Stay (HOSPITAL_BASED_OUTPATIENT_CLINIC_OR_DEPARTMENT_OTHER): Payer: Medicare PPO | Admitting: Hematology

## 2020-10-15 ENCOUNTER — Inpatient Hospital Stay: Payer: Medicare PPO | Attending: Hematology

## 2020-10-15 ENCOUNTER — Other Ambulatory Visit: Payer: Self-pay

## 2020-10-15 VITALS — BP 127/68 | HR 65 | Temp 98.2°F | Resp 18 | Ht 64.0 in | Wt 199.1 lb

## 2020-10-15 DIAGNOSIS — Z79899 Other long term (current) drug therapy: Secondary | ICD-10-CM | POA: Diagnosis not present

## 2020-10-15 DIAGNOSIS — D72119 Hypereosinophilic syndrome (hes), unspecified: Secondary | ICD-10-CM | POA: Diagnosis not present

## 2020-10-15 DIAGNOSIS — Z7951 Long term (current) use of inhaled steroids: Secondary | ICD-10-CM | POA: Diagnosis not present

## 2020-10-15 DIAGNOSIS — J45909 Unspecified asthma, uncomplicated: Secondary | ICD-10-CM | POA: Diagnosis not present

## 2020-10-15 DIAGNOSIS — D7589 Other specified diseases of blood and blood-forming organs: Secondary | ICD-10-CM | POA: Diagnosis not present

## 2020-10-15 DIAGNOSIS — D721 Eosinophilia, unspecified: Secondary | ICD-10-CM | POA: Diagnosis not present

## 2020-10-15 DIAGNOSIS — I10 Essential (primary) hypertension: Secondary | ICD-10-CM | POA: Diagnosis not present

## 2020-10-15 DIAGNOSIS — Z888 Allergy status to other drugs, medicaments and biological substances status: Secondary | ICD-10-CM | POA: Insufficient documentation

## 2020-10-15 DIAGNOSIS — M1990 Unspecified osteoarthritis, unspecified site: Secondary | ICD-10-CM | POA: Insufficient documentation

## 2020-10-15 DIAGNOSIS — Z8249 Family history of ischemic heart disease and other diseases of the circulatory system: Secondary | ICD-10-CM | POA: Diagnosis not present

## 2020-10-15 DIAGNOSIS — Z833 Family history of diabetes mellitus: Secondary | ICD-10-CM | POA: Insufficient documentation

## 2020-10-15 DIAGNOSIS — G4730 Sleep apnea, unspecified: Secondary | ICD-10-CM | POA: Insufficient documentation

## 2020-10-15 DIAGNOSIS — Z6834 Body mass index (BMI) 34.0-34.9, adult: Secondary | ICD-10-CM | POA: Insufficient documentation

## 2020-10-15 DIAGNOSIS — Z832 Family history of diseases of the blood and blood-forming organs and certain disorders involving the immune mechanism: Secondary | ICD-10-CM | POA: Diagnosis not present

## 2020-10-15 DIAGNOSIS — E669 Obesity, unspecified: Secondary | ICD-10-CM | POA: Insufficient documentation

## 2020-10-15 LAB — CBC WITH DIFFERENTIAL/PLATELET
Abs Immature Granulocytes: 0.02 10*3/uL (ref 0.00–0.07)
Basophils Absolute: 0 10*3/uL (ref 0.0–0.1)
Basophils Relative: 1 %
Eosinophils Absolute: 0.6 10*3/uL — ABNORMAL HIGH (ref 0.0–0.5)
Eosinophils Relative: 10 %
HCT: 36.5 % — ABNORMAL LOW (ref 39.0–52.0)
Hemoglobin: 13 g/dL (ref 13.0–17.0)
Immature Granulocytes: 0 %
Lymphocytes Relative: 12 %
Lymphs Abs: 0.8 10*3/uL (ref 0.7–4.0)
MCH: 36.4 pg — ABNORMAL HIGH (ref 26.0–34.0)
MCHC: 35.6 g/dL (ref 30.0–36.0)
MCV: 102.2 fL — ABNORMAL HIGH (ref 80.0–100.0)
Monocytes Absolute: 0.8 10*3/uL (ref 0.1–1.0)
Monocytes Relative: 12 %
Neutro Abs: 4.1 10*3/uL (ref 1.7–7.7)
Neutrophils Relative %: 65 %
Platelets: 146 10*3/uL — ABNORMAL LOW (ref 150–400)
RBC: 3.57 MIL/uL — ABNORMAL LOW (ref 4.22–5.81)
RDW: 13.5 % (ref 11.5–15.5)
WBC: 6.3 10*3/uL (ref 4.0–10.5)
nRBC: 0 % (ref 0.0–0.2)

## 2020-10-15 LAB — CMP (CANCER CENTER ONLY)
ALT: 21 U/L (ref 0–44)
AST: 26 U/L (ref 15–41)
Albumin: 4 g/dL (ref 3.5–5.0)
Alkaline Phosphatase: 62 U/L (ref 38–126)
Anion gap: 8 (ref 5–15)
BUN: 14 mg/dL (ref 8–23)
CO2: 22 mmol/L (ref 22–32)
Calcium: 9.2 mg/dL (ref 8.9–10.3)
Chloride: 108 mmol/L (ref 98–111)
Creatinine: 0.72 mg/dL (ref 0.61–1.24)
GFR, Estimated: >60 mL/min (ref >60)
Glucose, Bld: 128 mg/dL — ABNORMAL HIGH (ref 70–99)
Potassium: 4.4 mmol/L (ref 3.5–5.1)
Sodium: 138 mmol/L (ref 135–145)
Total Bilirubin: 1 mg/dL (ref 0.3–1.2)
Total Protein: 6.6 g/dL (ref 6.5–8.1)

## 2020-10-15 LAB — LACTATE DEHYDROGENASE: LDH: 232 U/L — ABNORMAL HIGH (ref 98–192)

## 2020-10-23 ENCOUNTER — Other Ambulatory Visit: Payer: Self-pay | Admitting: Internal Medicine

## 2020-11-01 DIAGNOSIS — M205X1 Other deformities of toe(s) (acquired), right foot: Secondary | ICD-10-CM | POA: Diagnosis not present

## 2020-11-01 DIAGNOSIS — L853 Xerosis cutis: Secondary | ICD-10-CM | POA: Diagnosis not present

## 2020-11-01 DIAGNOSIS — M79674 Pain in right toe(s): Secondary | ICD-10-CM | POA: Diagnosis not present

## 2020-11-01 DIAGNOSIS — M205X2 Other deformities of toe(s) (acquired), left foot: Secondary | ICD-10-CM | POA: Diagnosis not present

## 2020-11-09 ENCOUNTER — Other Ambulatory Visit: Payer: Self-pay | Admitting: Internal Medicine

## 2020-11-19 ENCOUNTER — Other Ambulatory Visit: Payer: Self-pay | Admitting: Internal Medicine

## 2020-11-20 ENCOUNTER — Other Ambulatory Visit: Payer: Self-pay | Admitting: Internal Medicine

## 2020-11-26 DIAGNOSIS — F411 Generalized anxiety disorder: Secondary | ICD-10-CM | POA: Diagnosis not present

## 2020-12-05 DIAGNOSIS — Z96652 Presence of left artificial knee joint: Secondary | ICD-10-CM | POA: Diagnosis not present

## 2020-12-07 ENCOUNTER — Other Ambulatory Visit: Payer: Self-pay | Admitting: Internal Medicine

## 2020-12-07 DIAGNOSIS — T8484XA Pain due to internal orthopedic prosthetic devices, implants and grafts, initial encounter: Secondary | ICD-10-CM | POA: Diagnosis not present

## 2020-12-07 DIAGNOSIS — Z96652 Presence of left artificial knee joint: Secondary | ICD-10-CM | POA: Diagnosis not present

## 2020-12-07 DIAGNOSIS — Z96651 Presence of right artificial knee joint: Secondary | ICD-10-CM | POA: Diagnosis not present

## 2020-12-12 NOTE — Telephone Encounter (Signed)
PT called to schedule a apt however the soonest in person apt is Oct 19 and he states this is cutting it close. PT would like to see if there is a way to get in sooner to get the form complete. Please advise.-

## 2020-12-17 ENCOUNTER — Other Ambulatory Visit: Payer: Self-pay | Admitting: Internal Medicine

## 2020-12-23 NOTE — Progress Notes (Signed)
Chief Complaint  Patient presents with   Annual Exam     HPI: Patient  Nesta Kimple Whitham  70 y.o. comes in today for [place] visit , med check and pre operative for  left knee revision  for loose apparatus per Dr Alvan Dame planned in October.  Had been active  cycling . Had been doing well but knee interfered.  Asthma is doing "great"    yearly  check Mood  some difficulty with knee  predicament .   I but s optimistic .  Low platelets:   following .  Not bleeding.  X senile purpura but no active bleeding.  Has seen hematologist in past. Dental has had difficulties throughout his life but states that he has a lower mouth abscess but not pain.  Has a follow-up later in October. Plan  covid booster today and flu shot scheduled. Left lower extremities had some swelling since his left knee has swollen has had fluid drained but no pain down the leg.  There is a bump pretibial unusual. Sees u rology  for stable elevated PSA Health Maintenance  Topic Date Due   COVID-19 Vaccine (5 - Booster for Moderna series) 10/29/2020   COLONOSCOPY (Pts 45-37yrs Insurance coverage will need to be confirmed)  06/24/2022   TETANUS/TDAP  09/09/2027   INFLUENZA VACCINE  Completed   Hepatitis C Screening  Completed   Zoster Vaccines- Shingrix  Completed   HPV VACCINES  Aged Out   Health Maintenance Review LIFESTYLE:  Exercise: Has had to decrease because of knee predicament Tobacco/ETS:n Alcohol: n Sugar beverages: n Sleep:cpap  OSA  at least 10 hours  Drug use: no HH of  1  no pets.   ROS:  REST of 12 system review negative except as per HPI   Past Medical History:  Diagnosis Date   Allergy    Arthritis    knees   Asthma    Depression    Elevated PSA    [place] and bx negative 2011   Hypertension    nl cardiolite in 1998   Pneumonia 2005   right hospitalized    Rhinosinusitis    Sleep apnea    wears CPAP    Past Surgical History:  Procedure Laterality Date   COLONOSCOPY   03/09/2013   FACIAL RECONSTRUCTION SURGERY     NASAL SINUS SURGERY     REPLACEMENT TOTAL KNEE  01-2010   left; Dr Alvan Dame; RIght knee 06-2010   shoulder atrhroscopic surgery  2014   Right   VENTRAL HERNIA REPAIR  03-2007   rerepair winter 2010    Family History  Problem Relation Age of Onset   Heart failure Father        MV replacement   Myelodysplastic syndrome Father    Asthma Mother    Polycythemia Brother    Asthma Other    Hypertension Other    Diabetes Maternal Grandmother    Healthy Sister    Healthy Brother    Healthy Brother    Colon cancer Neg Hx    Pancreatic cancer Neg Hx    Stomach cancer Neg Hx    Esophageal cancer Neg Hx    Colon polyps Neg Hx    Rectal cancer Neg Hx     Social History   Socioeconomic History   Marital status: Single    Spouse name: Not on file   Number of children: Not on file   Years of education: Not on file  Highest education level: Not on file  Occupational History   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   Stopped drinking in 1986    Nonosmoker    Single   HHof 1    Social Determinants of Health   Financial Resource Strain: Low Risk    Difficulty of Paying Living Expenses: Not hard at all  Food Insecurity: No Food Insecurity   Worried About Charity fundraiser in the Last Year: Never true   Ran Out of Food in the Last Year: Never true  Transportation Needs: No Transportation Needs   Lack of Transportation (Medical): No   Lack of Transportation (Non-Medical): No  Physical Activity: Sufficiently Active   Days of Exercise per Week: 5 days   Minutes of Exercise per  Session: 60 min  Stress: No Stress Concern Present   Feeling of Stress : Not at all  Social Connections: Moderately Isolated   Frequency of Communication with Friends and Family: More than three times a week   Frequency of Social Gatherings with Friends and Family: More than three times a week   Attends Religious Services: Never   Marine scientist or Organizations: Yes   Attends Archivist Meetings: Never   Marital Status: Divorced    Outpatient Medications Prior to Visit  Medication Sig Dispense Refill   amLODipine-benazepril (LOTREL) 5-10 MG capsule TAKE 1 CAPSULE BY MOUTH EVERY DAY 30 capsule 1   azelastine (ASTELIN) 0.1 % nasal spray Place 1-2 sprays into both nostrils 2 (two) times daily as needed for rhinitis. Use in each nostril as directed 30 mL 12   clobetasol (OLUX) 0.05 % topical foam Apply topically as needed.     COMBIVENT RESPIMAT 20-100 MCG/ACT AERS respimat INHALE 2 PUFFS UP TO FOUR TIMES A DAY AS NEEDED 4 g 5   escitalopram (LEXAPRO) 10 MG tablet TAKE 1 TABLET BY MOUTH EVERY DAY 30 tablet 2   fexofenadine (ALLEGRA) 180 MG tablet Take 180 mg by mouth daily.     fluticasone (FLONASE) 50 MCG/ACT nasal spray SPRAY 2 SPRAYS INTO EACH NOSTRIL EVERY DAY 16 mL 1   guaiFENesin (MUCINEX) 600 MG 12 hr tablet Take 1,200 mg by mouth daily.     ibuprofen (ADVIL) 800 MG tablet TAKE 1 TABLET BY MOUTH EVERY 8 HOURS AS NEEDED 60 tablet 0   silodosin (RAPAFLO) 8 MG CAPS capsule Take 1 capsule (8 mg total) by mouth at bedtime. 30 capsule 11   triamcinolone cream (KENALOG) 0.1 % Apply 1 application topically as needed.     WIXELA INHUB 100-50 MCG/ACT AEPB INHALE 1 PUFF BY MOUTH TWICE A DAY 60 each 6   No facility-administered medications prior to visit.     EXAM:  BP 110/68 (BP Location: Left Arm, Patient Position: Sitting, Cuff Size: Normal)   Pulse (!) 58   Temp 98.3 F (36.8 C) (Oral)   Ht 5\' 4"  (1.626 m)   Wt 199 lb (90.3 kg)   SpO2 96%   BMI 34.16 kg/m    Body mass index  is 34.16 kg/m. Wt Readings from Last 3 Encounters:  12/24/20 199 lb (90.3 kg)  10/15/20 199 lb 1.6 oz (90.3 kg)  09/19/20 203 lb 9.6 oz (92.4 kg)    Physical Exam: Vital signs reviewed VZC:HYIF is a well-developed well-nourished alert cooperative    who appearsr stated age in no acute distress.  HEENT: normocephalic atraumatic , Eyes: PERRL EOM's full, conjunctiva clear, Nares: paten,t no deformity discharge or tenderness., Ears: no deformity EAC's clear TMs with normal landmarks. Mouth: masked  NECK: supple without masses, thyromegaly or bruits. CHEST/PULM:  Clear to auscultation and percussion breath sounds equal no wheeze , rales or rhonchi. CV: PMI is nondisplaced, S1 S2 no gallops, murmurs, rubs. Peripheral pulses are full without delay.No JVD .  ABDOMEN: Bowel sounds normal nontender  No guard or rebound, no hepato splenomegal no CVA tenderness.   Extremtities:  No clubbing cyanosis or edema, left knee with mild to moderate effusion not hot or red left lower extremity distal has +1 edema but no redness or streaking right knee well-healed scar no swelling  NEURO:  Oriented x3, cranial nerves 3-12 appear to be intact, no obvious focal weakness,gait within normal limits no abnormal reflexes or asymmetrical SKIN: No acute rashes normal turgor, color, no bruising or petechiae.  Has a few senile ecchymosis on hand forearm PSYCH: Oriented, good eye contact, no obvious depression anxiety, cognition and judgment appear normal. LN: no cervical axillary inguinal adenopathy  Lab Results  Component Value Date   WBC 5.6 12/24/2020   HGB 13.7 12/24/2020   HCT 40.1 12/24/2020   PLT 133.0 (L) 12/24/2020   GLUCOSE 88 12/24/2020   CHOL 156 12/24/2020   TRIG 62.0 12/24/2020   HDL 46.20 12/24/2020   LDLCALC 97 12/24/2020   ALT 18 12/24/2020   AST 18 12/24/2020   NA 139 12/24/2020   K 4.4 12/24/2020   CL 101 12/24/2020   CREATININE 0.71 12/24/2020   BUN 24 (H) 12/24/2020    CO2 30 12/24/2020   TSH 2.77 12/24/2020   PSA 6.08 (H) 06/30/2019   INR 1.0 12/24/2020   HGBA1C 5.5 12/24/2020    BP Readings from Last 3 Encounters:  12/24/20 110/68  10/15/20 127/68  09/19/20 (!) 142/60   Lab Results  Component Value Date   VITAMINB12 553 03/13/2020    Lab plan and review reviewed with patient   ASSESSMENT AND PLAN:  Discussed the following assessment and plan:    ICD-10-CM   1. Visit for preventive health examination  Z00.00     2. Essential hypertension  O27 Basic metabolic panel    CBC with Differential/Platelet    Hemoglobin A1c    Hepatic function panel    Lipid panel    TSH    Protime-INR    Protime-INR    TSH    Lipid panel    Hepatic function panel    Hemoglobin A1c    CBC with Differential/Platelet    Basic metabolic panel    CANCELED: POCT Urinalysis Dipstick (Automated)    3. Pre-operative clearance  Z01.818     4. Eosinophilia, unspecified type  X41.28 Basic metabolic panel    CBC with Differential/Platelet    Hemoglobin A1c    Hepatic function panel    Lipid panel    TSH    Protime-INR    Protime-INR    TSH    Lipid panel    Hepatic function panel    Hemoglobin A1c    CBC with Differential/Platelet  Basic metabolic panel    CANCELED: POCT Urinalysis Dipstick (Automated)    5. Medication management  U20.254 Basic metabolic panel    CBC with Differential/Platelet    Hemoglobin A1c    Hepatic function panel    Lipid panel    TSH    Protime-INR    Protime-INR    TSH    Lipid panel    Hepatic function panel    Hemoglobin A1c    CBC with Differential/Platelet    Basic metabolic panel    CANCELED: POCT Urinalysis Dipstick (Automated)    6. Asthma, chronic, unspecified asthma severity, uncomplicated  Y70.623 Basic metabolic panel    CBC with Differential/Platelet    Hemoglobin A1c    Hepatic function panel    Lipid panel    TSH    Protime-INR    Protime-INR    TSH    Lipid panel    Hepatic function panel     Hemoglobin A1c    CBC with Differential/Platelet    Basic metabolic panel    CANCELED: POCT Urinalysis Dipstick (Automated)    7. Obstructive sleep apnea  J62.83 Basic metabolic panel    CBC with Differential/Platelet    Hemoglobin A1c    Hepatic function panel    Lipid panel    TSH    Protime-INR    Protime-INR    TSH    Lipid panel    Hepatic function panel    Hemoglobin A1c    CBC with Differential/Platelet    Basic metabolic panel    CANCELED: POCT Urinalysis Dipstick (Automated)    8. Thrombocytopenia (HCC)  T51.7 Basic metabolic panel    CBC with Differential/Platelet    Hemoglobin A1c    Hepatic function panel    Lipid panel    TSH    Protime-INR    Protime-INR    TSH    Lipid panel    Hepatic function panel    Hemoglobin A1c    CBC with Differential/Platelet    Basic metabolic panel    CANCELED: POCT Urinalysis Dipstick (Automated)    9. Pre-op evaluation  O16.073 Basic metabolic panel    CBC with Differential/Platelet    Hemoglobin A1c    Hepatic function panel    Lipid panel    TSH    Protime-INR    POCT Urinalysis Dipstick (Automated)    Protime-INR    TSH    Lipid panel    Hepatic function panel    Hemoglobin A1c    CBC with Differential/Platelet    Basic metabolic panel    CANCELED: POCT Urinalysis Dipstick (Automated)    10. Swelling of joint, knee, left  X10.626 Basic metabolic panel    CBC with Differential/Platelet    Hemoglobin A1c    Hepatic function panel    Lipid panel    TSH    Protime-INR    Protime-INR    TSH    Lipid panel    Hepatic function panel    Hemoglobin A1c    CBC with Differential/Platelet    Basic metabolic panel    CANCELED: POCT Urinalysis Dipstick (Automated)    11. Status post bilateral knee replacements  R48.546 Basic metabolic panel    CBC with Differential/Platelet    Hemoglobin A1c    Hepatic function panel    Lipid panel    TSH    Protime-INR    Protime-INR    TSH    Lipid panel    Hepatic  function  panel    Hemoglobin A1c    CBC with Differential/Platelet    Basic metabolic panel    CANCELED: POCT Urinalysis Dipstick (Automated)     Plan fax clearance to Dr. Alvan Dame with lab results when available. Have him follow-up swelling in your left lower extremity Also get dental oral specialists opinion on assessment if any dental infection to be addressed before surgery and to discuss with orthopedics.  Return for depending on results and   every 6-12 months depending .  Patient Care Team: Nelva Hauk, Standley Brooking, MD as PCP - General orthopedic Festus Aloe, MD as Attending Physician (Urology) Deneise Lever, MD as Attending Physician (Pulmonary Disease) Patient Instructions  Good to see you today . Will share labs and  clearance info with Dr Alvan Dame. Get the dental oral  specialists to opine on " abscess" before surgery .  To assess risk  of infection  before surgery.   Surmising the swelling in left leg is from the knee predicament.   Ok for surgery from medical point of view at this time.    Standley Brooking. Lyncoln Maskell M.D.

## 2020-12-24 ENCOUNTER — Other Ambulatory Visit: Payer: Self-pay

## 2020-12-24 ENCOUNTER — Encounter: Payer: Self-pay | Admitting: Internal Medicine

## 2020-12-24 ENCOUNTER — Ambulatory Visit: Payer: Medicare PPO | Admitting: Internal Medicine

## 2020-12-24 VITALS — BP 110/68 | HR 58 | Temp 98.3°F | Ht 64.0 in | Wt 199.0 lb

## 2020-12-24 DIAGNOSIS — I10 Essential (primary) hypertension: Secondary | ICD-10-CM

## 2020-12-24 DIAGNOSIS — Z01818 Encounter for other preprocedural examination: Secondary | ICD-10-CM

## 2020-12-24 DIAGNOSIS — Z79899 Other long term (current) drug therapy: Secondary | ICD-10-CM | POA: Diagnosis not present

## 2020-12-24 DIAGNOSIS — D721 Eosinophilia, unspecified: Secondary | ICD-10-CM | POA: Diagnosis not present

## 2020-12-24 DIAGNOSIS — G4733 Obstructive sleep apnea (adult) (pediatric): Secondary | ICD-10-CM | POA: Diagnosis not present

## 2020-12-24 DIAGNOSIS — Z96653 Presence of artificial knee joint, bilateral: Secondary | ICD-10-CM

## 2020-12-24 DIAGNOSIS — J45909 Unspecified asthma, uncomplicated: Secondary | ICD-10-CM

## 2020-12-24 DIAGNOSIS — D696 Thrombocytopenia, unspecified: Secondary | ICD-10-CM

## 2020-12-24 DIAGNOSIS — Z0000 Encounter for general adult medical examination without abnormal findings: Secondary | ICD-10-CM

## 2020-12-24 DIAGNOSIS — M25462 Effusion, left knee: Secondary | ICD-10-CM | POA: Diagnosis not present

## 2020-12-24 LAB — POC URINALSYSI DIPSTICK (AUTOMATED)
Bilirubin, UA: NEGATIVE
Blood, UA: NEGATIVE
Glucose, UA: NEGATIVE
Ketones, UA: NEGATIVE
Nitrite, UA: NEGATIVE
Protein, UA: NEGATIVE
Spec Grav, UA: 1.015 (ref 1.010–1.025)
Urobilinogen, UA: 0.2 E.U./dL
pH, UA: 7.5 (ref 5.0–8.0)

## 2020-12-24 LAB — CBC WITH DIFFERENTIAL/PLATELET
Basophils Absolute: 0 10*3/uL (ref 0.0–0.1)
Basophils Relative: 0.8 % (ref 0.0–3.0)
Eosinophils Absolute: 1 10*3/uL — ABNORMAL HIGH (ref 0.0–0.7)
Eosinophils Relative: 17.8 % — ABNORMAL HIGH (ref 0.0–5.0)
HCT: 40.1 % (ref 39.0–52.0)
Hemoglobin: 13.7 g/dL (ref 13.0–17.0)
Lymphocytes Relative: 13.8 % (ref 12.0–46.0)
Lymphs Abs: 0.8 10*3/uL (ref 0.7–4.0)
MCHC: 34.2 g/dL (ref 30.0–36.0)
MCV: 105.5 fl — ABNORMAL HIGH (ref 78.0–100.0)
Monocytes Absolute: 0.7 10*3/uL (ref 0.1–1.0)
Monocytes Relative: 11.7 % (ref 3.0–12.0)
Neutro Abs: 3.1 10*3/uL (ref 1.4–7.7)
Neutrophils Relative %: 55.9 % (ref 43.0–77.0)
Platelets: 133 10*3/uL — ABNORMAL LOW (ref 150.0–400.0)
RBC: 3.8 Mil/uL — ABNORMAL LOW (ref 4.22–5.81)
RDW: 14.6 % (ref 11.5–15.5)
WBC: 5.6 10*3/uL (ref 4.0–10.5)

## 2020-12-24 LAB — BASIC METABOLIC PANEL
BUN: 24 mg/dL — ABNORMAL HIGH (ref 6–23)
CO2: 30 mEq/L (ref 19–32)
Calcium: 9.4 mg/dL (ref 8.4–10.5)
Chloride: 101 mEq/L (ref 96–112)
Creatinine, Ser: 0.71 mg/dL (ref 0.40–1.50)
GFR: 93.78 mL/min (ref >60.00)
Glucose, Bld: 88 mg/dL (ref 70–99)
Potassium: 4.4 mEq/L (ref 3.5–5.1)
Sodium: 139 mEq/L (ref 135–145)

## 2020-12-24 LAB — LIPID PANEL
Cholesterol: 156 mg/dL (ref 0–200)
HDL: 46.2 mg/dL (ref >39.00)
LDL Cholesterol: 97 mg/dL (ref 0–99)
NonHDL: 109.69
Total CHOL/HDL Ratio: 3
Triglycerides: 62 mg/dL (ref 0.0–149.0)
VLDL: 12.4 mg/dL (ref 0.0–40.0)

## 2020-12-24 LAB — HEPATIC FUNCTION PANEL
ALT: 18 U/L (ref 0–53)
AST: 18 U/L (ref 0–37)
Albumin: 4.4 g/dL (ref 3.5–5.2)
Alkaline Phosphatase: 57 U/L (ref 39–117)
Bilirubin, Direct: 0.2 mg/dL (ref 0.0–0.3)
Total Bilirubin: 0.9 mg/dL (ref 0.2–1.2)
Total Protein: 6.5 g/dL (ref 6.0–8.3)

## 2020-12-24 LAB — PROTIME-INR
INR: 1 ratio (ref 0.8–1.0)
Prothrombin Time: 11.2 s (ref 9.6–13.1)

## 2020-12-24 LAB — HEMOGLOBIN A1C: Hgb A1c MFr Bld: 5.5 % (ref 4.6–6.5)

## 2020-12-24 LAB — TSH: TSH: 2.77 u[IU]/mL (ref 0.35–5.50)

## 2020-12-24 NOTE — Patient Instructions (Addendum)
Good to see you today . Will share labs and  clearance info with Dr Alvan Dame. Get the dental oral  specialists to opine on " abscess" before surgery .  To assess risk  of infection  before surgery.   Surmising the swelling in left leg is from the knee predicament.   Ok for surgery from medical point of view at this time.

## 2020-12-25 NOTE — Addendum Note (Signed)
Addended by: Nilda Riggs on: 12/25/2020 01:22 PM   Modules accepted: Orders

## 2020-12-25 NOTE — Progress Notes (Signed)
Blood work stable eosinophils are up a little bit consistent with allergy size of red blood cells are still on the high side as they have been over the years you are not anemic platelets are 1 33,000.   Rest of labs are basically normal  no diabetes   urine does show a small amount of inflammation white cells unclear significance since you are not having symptoms.of infection Will forward labs to surgery office    Advise 6 mos fu with lab cbc diff to follow

## 2020-12-31 MED ORDER — AZELASTINE HCL 0.1 % NA SOLN: 1.0000 | Freq: Two times a day (BID) | NASAL | 12 refills | Status: DC | PRN

## 2021-01-09 ENCOUNTER — Encounter (HOSPITAL_COMMUNITY): Payer: Medicare PPO

## 2021-01-16 ENCOUNTER — Ambulatory Visit: Payer: Medicare PPO | Admitting: Internal Medicine

## 2021-01-22 ENCOUNTER — Inpatient Hospital Stay: Admit: 2021-01-22 | Payer: Medicare PPO | Admitting: Orthopedic Surgery

## 2021-01-22 SURGERY — TOTAL KNEE REVISION
Anesthesia: Spinal | Site: Knee | Laterality: Left

## 2021-01-26 ENCOUNTER — Other Ambulatory Visit: Payer: Self-pay | Admitting: Internal Medicine

## 2021-01-28 DIAGNOSIS — F411 Generalized anxiety disorder: Secondary | ICD-10-CM | POA: Diagnosis not present

## 2021-02-07 DIAGNOSIS — R972 Elevated prostate specific antigen [PSA]: Secondary | ICD-10-CM | POA: Diagnosis not present

## 2021-02-11 DIAGNOSIS — F411 Generalized anxiety disorder: Secondary | ICD-10-CM | POA: Diagnosis not present

## 2021-02-14 DIAGNOSIS — R972 Elevated prostate specific antigen [PSA]: Secondary | ICD-10-CM | POA: Diagnosis not present

## 2021-02-14 DIAGNOSIS — N401 Enlarged prostate with lower urinary tract symptoms: Secondary | ICD-10-CM | POA: Diagnosis not present

## 2021-02-14 DIAGNOSIS — R3912 Poor urinary stream: Secondary | ICD-10-CM | POA: Diagnosis not present

## 2021-02-16 ENCOUNTER — Other Ambulatory Visit: Payer: Self-pay | Admitting: Internal Medicine

## 2021-02-26 ENCOUNTER — Other Ambulatory Visit: Payer: Self-pay | Admitting: Internal Medicine

## 2021-04-02 ENCOUNTER — Other Ambulatory Visit: Payer: Self-pay | Admitting: Internal Medicine

## 2021-04-03 NOTE — Telephone Encounter (Signed)
Flonase refilled.

## 2021-04-29 DIAGNOSIS — F411 Generalized anxiety disorder: Secondary | ICD-10-CM | POA: Diagnosis not present

## 2021-05-24 DIAGNOSIS — Z96611 Presence of right artificial shoulder joint: Secondary | ICD-10-CM | POA: Diagnosis not present

## 2021-05-24 DIAGNOSIS — M25511 Pain in right shoulder: Secondary | ICD-10-CM | POA: Diagnosis not present

## 2021-05-27 DIAGNOSIS — T8484XA Pain due to internal orthopedic prosthetic devices, implants and grafts, initial encounter: Secondary | ICD-10-CM | POA: Diagnosis not present

## 2021-05-27 DIAGNOSIS — Z96652 Presence of left artificial knee joint: Secondary | ICD-10-CM | POA: Diagnosis not present

## 2021-05-27 DIAGNOSIS — M25562 Pain in left knee: Secondary | ICD-10-CM | POA: Diagnosis not present

## 2021-06-03 DIAGNOSIS — F411 Generalized anxiety disorder: Secondary | ICD-10-CM | POA: Diagnosis not present

## 2021-06-06 ENCOUNTER — Ambulatory Visit (INDEPENDENT_AMBULATORY_CARE_PROVIDER_SITE_OTHER): Payer: Medicare PPO

## 2021-06-06 VITALS — Ht 64.0 in | Wt 199.0 lb

## 2021-06-06 DIAGNOSIS — M25511 Pain in right shoulder: Secondary | ICD-10-CM | POA: Diagnosis not present

## 2021-06-06 DIAGNOSIS — Z0000 Encounter for general adult medical examination without abnormal findings: Secondary | ICD-10-CM | POA: Diagnosis not present

## 2021-06-06 NOTE — Patient Instructions (Addendum)
[name] , Thank you for taking time to come for your Medicare Wellness Visit. I appreciate your ongoing commitment to your health goals. Please review the following plan we discussed and let me know if I can assist you in the future.   These are the goals we discussed:  Goals       Exercise 150 min/wk Moderate Activity (pt-stated)      Would like to work more on my cardio exercise and increase mobility.      Patient Stated      I would like to scuba dive more!         This is a list of the screening recommended for you and due dates:  Health Maintenance  Topic Date Due   Colon Cancer Screening  06/24/2022   Tetanus Vaccine  09/09/2027   Pneumonia Vaccine  Completed   Flu Shot  Completed   COVID-19 Vaccine  Completed   Hepatitis C Screening: USPSTF Recommendation to screen - Ages 18-79 yo.  Completed   Zoster (Shingles) Vaccine  Completed   HPV Vaccine  Aged Out   Advanced directives: No   Conditions/risks identified: None  Next appointment: Follow up in one year for your annual wellness visit.   Preventive Care 37 Years and Older, Male Preventive care refers to lifestyle choices and visits with your health care provider that can promote health and wellness. What does preventive care include? A yearly physical exam. This is also called an annual well check. Dental exams once or twice a year. Routine eye exams. Ask your health care provider how often you should have your eyes checked. Personal lifestyle choices, including: Daily care of your teeth and gums. Regular physical activity. Eating a healthy diet. Avoiding tobacco and drug use. Limiting alcohol use. Practicing safe sex. Taking low doses of aspirin every day. Taking vitamin and mineral supplements as recommended by your health care provider. What happens during an annual well check? The services and screenings done by your health care provider during your annual well check will depend on your age, overall  health, lifestyle risk factors, and family history of disease. Counseling  Your health care provider may ask you questions about your: Alcohol use. Tobacco use. Drug use. Emotional well-being. Home and relationship well-being. Sexual activity. Eating habits. History of falls. Memory and ability to understand (cognition). Work and work Statistician. Screening  You may have the following tests or measurements: Height, weight, and BMI. Blood pressure. Lipid and cholesterol levels. These may be checked every 5 years, or more frequently if you are over 95 years old. Skin check. Lung cancer screening. You may have this screening every year starting at age 13 if you have a 30-pack-year history of smoking and currently smoke or have quit within the past 15 years. Fecal occult blood test (FOBT) of the stool. You may have this test every year starting at age 43. Flexible sigmoidoscopy or colonoscopy. You may have a sigmoidoscopy every 5 years or a colonoscopy every 10 years starting at age 53. Prostate cancer screening. Recommendations will vary depending on your family history and other risks. Hepatitis C blood test. Hepatitis B blood test. Sexually transmitted disease (STD) testing. Diabetes screening. This is done by checking your blood sugar (glucose) after you have not eaten for a while (fasting). You may have this done every 1-3 years. Abdominal aortic aneurysm (AAA) screening. You may need this if you are a current or former smoker. Osteoporosis. You may be screened starting at age  70 if you are at high risk. Talk with your health care provider about your test results, treatment options, and if necessary, the need for more tests. Vaccines  Your health care provider may recommend certain vaccines, such as: Influenza vaccine. This is recommended every year. Tetanus, diphtheria, and acellular pertussis (Tdap, Td) vaccine. You may need a Td booster every 10 years. Zoster vaccine. You may  need this after age 39. Pneumococcal 13-valent conjugate (PCV13) vaccine. One dose is recommended after age 70. Pneumococcal polysaccharide (PPSV23) vaccine. One dose is recommended after age 78. Talk to your health care provider about which screenings and vaccines you need and how often you need them. This information is not intended to replace advice given to you by your health care provider. Make sure you discuss any questions you have with your health care provider. Document Released: 04/13/2015 Document Revised: 12/05/2015 Document Reviewed: 01/16/2015 Elsevier Interactive Patient Education  2017 [place] Prevention in the Home Falls can cause injuries. They can happen to people of all ages. There are many things you can do to make your home safe and to help prevent falls. What can I do on the outside of my home? Regularly fix the edges of walkways and driveways and fix any cracks. Remove anything that might make you trip as you walk through a door, such as a raised step or threshold. Trim any bushes or trees on the path to your home. Use bright outdoor lighting. Clear any walking paths of anything that might make someone trip, such as rocks or tools. Regularly check to see if handrails are loose or broken. Make sure that both sides of any steps have handrails. Any raised decks and porches should have guardrails on the edges. Have any leaves, snow, or ice cleared regularly. Use sand or salt on walking paths during winter. Clean up any spills in your garage right away. This includes oil or grease spills. What can I do in the bathroom? Use night lights. Install grab bars by the toilet and in the tub and shower. Do not use towel bars as grab bars. Use non-skid mats or decals in the tub or shower. If you need to sit down in the shower, use a plastic, non-slip stool. Keep the floor dry. Clean up any water that spills on the floor as soon as it happens. Remove soap buildup in  the tub or shower regularly. Attach bath mats securely with double-sided non-slip rug tape. Do not have throw rugs and other things on the floor that can make you trip. What can I do in the bedroom? Use night lights. Make sure that you have a light by your bed that is easy to reach. Do not use any sheets or blankets that are too big for your bed. They should not hang down onto the floor. Have a firm chair that has side arms. You can use this for support while you get dressed. Do not have throw rugs and other things on the floor that can make you trip. What can I do in the kitchen? Clean up any spills right away. Avoid walking on wet floors. Keep items that you use a lot in easy-to-reach places. If you need to reach something above you, use a strong step stool that has a grab bar. Keep electrical cords out of the way. Do not use floor polish or wax that makes floors slippery. If you must use wax, use non-skid floor wax. Do not have throw rugs and other  things on the floor that can make you trip. What can I do with my stairs? Do not leave any items on the stairs. Make sure that there are handrails on both sides of the stairs and use them. Fix handrails that are broken or loose. Make sure that handrails are as long as the stairways. Check any carpeting to make sure that it is firmly attached to the stairs. Fix any carpet that is loose or worn. Avoid having throw rugs at the top or bottom of the stairs. If you do have throw rugs, attach them to the floor with carpet tape. Make sure that you have a light switch at the top of the stairs and the bottom of the stairs. If you do not have them, ask someone to add them for you. What else can I do to help prevent falls? Wear shoes that: Do not have high heels. Have rubber bottoms. Are comfortable and fit you well. Are closed at the toe. Do not wear sandals. If you use a stepladder: Make sure that it is fully opened. Do not climb a closed  stepladder. Make sure that both sides of the stepladder are locked into place. Ask someone to hold it for you, if possible. Clearly mark and make sure that you can see: Any grab bars or handrails. First and last steps. Where the edge of each step is. Use tools that help you move around (mobility aids) if they are needed. These include: Canes. Walkers. Scooters. Crutches. Turn on the lights when you go into a dark area. Replace any light bulbs as soon as they burn out. Set up your furniture so you have a clear path. Avoid moving your furniture around. If any of your floors are uneven, fix them. If there are any pets around you, be aware of where they are. Review your medicines with your doctor. Some medicines can make you feel dizzy. This can increase your chance of falling. Ask your doctor what other things that you can do to help prevent falls. This information is not intended to replace advice given to you by your health care provider. Make sure you discuss any questions you have with your health care provider. Document Released: 01/11/2009 Document Revised: 08/23/2015 Document Reviewed: 04/21/2014 Elsevier Interactive Patient Education  2017 [employer].

## 2021-06-06 NOTE — Progress Notes (Signed)
Subjective:   [name] is a 71 y.o. male who presents for Medicare Annual/Subsequent preventive examination.  Review of Systems    Virtual Visit via Telephone Note  I connected with  [name] on 06/06/21 at  9:00 AM EST by telephone and verified that I am speaking with the correct person using two identifiers.  Location: Patient: Home Provider: Office Persons participating in the virtual visit: patient/Nurse Health Advisor   I discussed the limitations, risks, security and privacy concerns of performing an evaluation and management service by telephone and the availability of in person appointments. The patient expressed understanding and agreed to proceed.  Interactive audio and video telecommunications were attempted between this nurse and patient, however failed, due to patient having technical difficulties OR patient did not have access to video capability.  We continued and completed visit with audio only.  Some vital signs may be absent or patient reported.   Criselda Peaches, LPN  Cardiac Risk Factors include: advanced age (>64mn, >>44women);hypertension;male gender     Objective:    Today's Vitals   06/06/21 0900 06/06/21 0901  Weight: 199 lb (90.3 kg)   Height: '5\' 4"'$  (1.626 m)   PainSc:  8    Body mass index is 34.16 kg/m.  Advanced Directives 06/06/2021 06/05/2020 07/24/2016  Does Patient Have a Medical Advance Directive? No No No  Would patient like information on creating a medical advance directive? No - Patient declined No - Patient declined -    Current Medications (verified) Outpatient Encounter Medications as of 06/06/2021  Medication Sig   amLODipine-benazepril (LOTREL) 5-10 MG capsule TAKE 1 CAPSULE BY MOUTH EVERY DAY   azelastine (ASTELIN) 0.1 % nasal spray Place 1-2 sprays into both nostrils 2 (two) times daily as needed for rhinitis. Use in each nostril as directed   clobetasol (OLUX) 0.05 % topical foam Apply topically as needed.    COMBIVENT RESPIMAT 20-100 MCG/ACT AERS respimat INHALE 2 PUFFS UP TO FOUR TIMES A DAY AS NEEDED   escitalopram (LEXAPRO) 10 MG tablet TAKE 1 TABLET BY MOUTH EVERY DAY   fexofenadine (ALLEGRA) 180 MG tablet Take 180 mg by mouth daily.   fluticasone (FLONASE) 50 MCG/ACT nasal spray SPRAY 2 SPRAYS INTO EACH NOSTRIL EVERY DAY   guaiFENesin (MUCINEX) 600 MG 12 hr tablet Take 1,200 mg by mouth daily.   ibuprofen (ADVIL) 800 MG tablet TAKE 1 TABLET BY MOUTH EVERY 8 HOURS AS NEEDED   silodosin (RAPAFLO) 8 MG CAPS capsule Take 1 capsule (8 mg total) by mouth at bedtime.   triamcinolone cream (KENALOG) 0.1 % Apply 1 application topically as needed.   WIXELA INHUB 100-50 MCG/ACT AEPB INHALE 1 PUFF BY MOUTH TWICE A DAY   No facility-administered encounter medications on file as of 06/06/2021.    Allergies (verified) Eggs or egg-derived products and Tape   History: Past Medical History:  Diagnosis Date   Allergy    Arthritis    knees   Asthma    Depression    Elevated PSA    u[place] bx negative 2011   Hypertension    nl cardiolite in 1998   Pneumonia 2005   right hospitalized    Rhinosinusitis    Sleep apnea    wears CPAP   Past Surgical History:  Procedure Laterality Date   COLONOSCOPY  03/09/2013   FACIAL RECONSTRUCTION SURGERY     NASAL SINUS SURGERY     REPLACEMENT TOTAL KNEE  01-2010   left; Dr  Alvan Dame; RIght knee 06-2010   shoulder atrhroscopic surgery  2014   Right   VENTRAL HERNIA REPAIR  03-2007   rerepair winter 2010   Family History  Problem Relation Age of Onset   Heart failure Father        MV replacement   Myelodysplastic syndrome Father    Asthma Mother    Polycythemia Brother    Asthma Other    Hypertension Other    Diabetes Maternal Grandmother    Healthy Sister    Healthy Brother    Healthy Brother    Colon cancer Neg Hx    Pancreatic cancer Neg Hx    Stomach cancer Neg Hx    Esophageal cancer Neg Hx    Colon polyps Neg Hx    Rectal cancer Neg Hx     Social History   Socioeconomic History   Marital status: Single    Spouse name: Not on file   Number of children: Not on file   Years of education: Not on file   Highest education level: Not on file  Occupational History   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   Stopped drinking in 1986    Nonosmoker    Single   HHof 1    Social Determinants of Health   Financial Resource Strain: Low Risk    Difficulty of Paying Living Expenses: Not hard at all  Food Insecurity: No Food Insecurity   Worried About Charity fundraiser in the Last Year: Never true   [place] in the Last Year: Never true  Transportation Needs: No Transportation Needs   Lack of Transportation (Medical): No   Lack of Transportation (Non-Medical): No  Physical Activity: Sufficiently Active   Days of Exercise per Week: 4 days   Minutes of Exercise per Session: 60 min  Stress: No Stress Concern Present   Feeling of Stress : Not at all  Social Connections: Moderately Isolated   Frequency of Communication with Friends and Family: More than three times a week   Frequency of Social Gatherings with Friends and Family: More than three times a week   Attends Religious Services: Never   Marine scientist or Organizations: Yes   Attends Music therapist: More than 4 times per year   Marital Status: Divorced    Tobacco Counseling Counseling given: Not Answered   Clinical Intake:   Diabetic?  No  Activities of Daily Living In your present state of health, do you have any difficulty performing the following activities: 06/06/2021   Hearing? N  Vision? N  Difficulty concentrating or making decisions? N  Walking or climbing stairs? N  Dressing or bathing? N  Doing errands, shopping? N  Preparing Food and eating ? N  Using the Toilet? N  In the past six months, have you accidently leaked urine? N  Do you have problems with loss of bowel control? N  Managing your Medications? N  Managing your Finances? N  Housekeeping or managing your Housekeeping? N  Some recent  data might be hidden    Patient Care Team: Panosh, Standley Brooking, MD as PCP - General orthopedic Festus Aloe, MD as Attending Physician (Urology) Deneise Lever, MD as Attending Physician (Pulmonary Disease)  Indicate any recent Medical Services you may have received from other than [facility] providers in the past year (date may be approximate).     Assessment:   This is a routine wellness examination for [place].  Hearing/Vision screen Hearing Screening - Comments:: No difficulty hearing Vision Screening - Comments:: Wears glasses. Followed by Dr Gershon Crane  Dietary issues and exercise activities discussed: Exercise limited by: None identified   Goals Addressed               This Visit's Progress     Exercise 150 min/wk Moderate Activity (pt-stated)        Would like to work more on my cardio exercise and increase mobility.       Depression Screen PHQ 2/9 Scores 06/06/2021 06/05/2020 07/08/2019  PHQ - 2 Score 0 0 0  PHQ- 9 Score - - 0    Fall Risk Fall Risk  06/06/2021 06/05/2020 07/08/2019 11/07/2016  Falls in the past year? 0 0 0 No  Number falls in past yr: 0 0 - -  Injury with Fall? 0 0 - -  Risk for fall due to : No Fall Risks No Fall Risks - -  Follow up - Falls evaluation completed;Falls prevention discussed - -    FALL RISK PREVENTION PERTAINING TO THE HOME:  Any stairs in or around the home? Yes  If so, are there any without handrails? No  Home free of loose throw rugs in walkways, pet beds, electrical cords, etc? Yes  Adequate  lighting in your home to reduce risk of falls? Yes   ASSISTIVE DEVICES UTILIZED TO PREVENT FALLS:  Life alert? No  Use of a cane, walker or w/c? No  Grab bars in the bathroom? No  Shower chair or bench in shower? No  Elevated toilet seat or a handicapped toilet? No   TIMED UP AND GO:  Was the test performed? No . Audio Visit  Cognitive Function:   6CIT Screen 06/06/2021  What Year? 0 points  What month? 0 points  What time? 0 points  Count back from 20 0 points  Months in reverse 0 points  Repeat phrase 0 points  Total Score 0    Immunizations Immunization History  Administered Date(s) Administered   Fluad Quad(high Dose 65+) 01/11/2019, 01/06/2020, 12/24/2020   H1N1 04/03/2008   Influenza Split 04/08/2011, 02/19/2012, 12/27/2015   Influenza Whole 02/02/2007, 03/14/2009, 01/28/2010   Influenza,inj,Quad PF,6+ Mos 12/01/2012, 12/16/2013, 12/20/2014, 12/30/2016, 01/05/2018   Influenza,inj,quad, With Preservative 12/29/2016   Moderna Sars-Covid-2 Vaccination 05/02/2019, 05/31/2019, 01/22/2020, 06/29/2020   Pfizer Covid-19 Vaccine Bivalent Booster 39yr & up 12/24/2020   Pneumococcal Conjugate-13 10/19/2013   Pneumococcal Polysaccharide-23 09/17/2009, 11/07/2016   Td 09/17/2009   Zoster Recombinat (Shingrix) 11/07/2016, 02/18/2017   Zoster, Live 11/02/2015    TDAP status: Up to date  Flu Vaccine status: Up to date  Pneumococcal vaccine status: Up to date  Covid-19 vaccine status: Completed vaccines  Qualifies for Shingles Vaccine? Yes   Zostavax completed Yes   Shingrix Completed?: Yes  Screening Tests Health Maintenance  Topic Date Due   COLONOSCOPY (Pts 45-44yrInsurance coverage will need to be confirmed)  06/24/2022   TETANUS/TDAP  09/09/2027   Pneumonia Vaccine 6535Years old  Completed   INFLUENZA VACCINE  Completed  COVID-19 Vaccine  Completed   Hepatitis C Screening  Completed   Zoster Vaccines- Shingrix  Completed   HPV VACCINES  Aged Out     Health Maintenance  There are no preventive care reminders to display for this patient.  Colorectal cancer screening: Type of screening: Colonoscopy. Completed 06/24/19. Repeat every 3 years  Lung Cancer Screening: (Low Dose CT Chest recommended if Age 24-80 years, 30 pack-year currently smoking OR have quit w/in 15years.) does not qualify.     Additional Screening:  Hepatitis C Screening: does qualify; Completed 01/02/16  Vision Screening: Recommended annual ophthalmology exams for early detection of glaucoma and other disorders of the eye. Is the patient up to date with their annual eye exam?  Yes  Who is the provider or what is the name of the office in which the patient attends annual eye exams? Dr Gershon Crane If pt is not established with a provider, would they like to be referred to a provider to establish care? No .   Dental Screening: Recommended annual dental exams for proper oral hygiene  Community Resource Referral / Chronic Care Management:  CRR required this visit?  No   CCM required this visit?  No      Plan:     I have personally reviewed and noted the following in the patients chart:   Medical and social history Use of alcohol, tobacco or illicit drugs  Current medications and supplements including opioid prescriptions. Patient is not currently taking opioid prescriptions. Functional ability and status Nutritional status Physical activity Advanced directives List of other physicians Hospitalizations, surgeries, and ER visits in previous 12 months Vitals Screenings to include cognitive, depression, and falls Referrals and appointments  In addition, I have reviewed and discussed with patient certain preventive protocols, quality metrics, and best practice recommendations. A written personalized care plan for preventive services as well as general preventive health recommendations were provided to patient.     Criselda Peaches, LPN   0/07/3974   Nurse  Notes: None

## 2021-06-10 DIAGNOSIS — Z96611 Presence of right artificial shoulder joint: Secondary | ICD-10-CM | POA: Diagnosis not present

## 2021-06-10 DIAGNOSIS — M25511 Pain in right shoulder: Secondary | ICD-10-CM | POA: Diagnosis not present

## 2021-07-01 DIAGNOSIS — F411 Generalized anxiety disorder: Secondary | ICD-10-CM | POA: Diagnosis not present

## 2021-07-10 DIAGNOSIS — M25511 Pain in right shoulder: Secondary | ICD-10-CM | POA: Diagnosis not present

## 2021-07-10 DIAGNOSIS — Z96611 Presence of right artificial shoulder joint: Secondary | ICD-10-CM | POA: Diagnosis not present

## 2021-07-12 ENCOUNTER — Other Ambulatory Visit: Payer: Self-pay | Admitting: Internal Medicine

## 2021-08-01 ENCOUNTER — Other Ambulatory Visit: Payer: Self-pay | Admitting: Internal Medicine

## 2021-08-01 DIAGNOSIS — H524 Presbyopia: Secondary | ICD-10-CM | POA: Diagnosis not present

## 2021-08-01 DIAGNOSIS — H5213 Myopia, bilateral: Secondary | ICD-10-CM | POA: Diagnosis not present

## 2021-08-01 DIAGNOSIS — H04203 Unspecified epiphora, bilateral lacrimal glands: Secondary | ICD-10-CM | POA: Diagnosis not present

## 2021-08-01 DIAGNOSIS — H2513 Age-related nuclear cataract, bilateral: Secondary | ICD-10-CM | POA: Diagnosis not present

## 2021-08-01 DIAGNOSIS — H02102 Unspecified ectropion of right lower eyelid: Secondary | ICD-10-CM | POA: Diagnosis not present

## 2021-08-01 DIAGNOSIS — H02105 Unspecified ectropion of left lower eyelid: Secondary | ICD-10-CM | POA: Diagnosis not present

## 2021-08-01 DIAGNOSIS — H25013 Cortical age-related cataract, bilateral: Secondary | ICD-10-CM | POA: Diagnosis not present

## 2021-08-08 DIAGNOSIS — R972 Elevated prostate specific antigen [PSA]: Secondary | ICD-10-CM | POA: Diagnosis not present

## 2021-08-09 DIAGNOSIS — Z96611 Presence of right artificial shoulder joint: Secondary | ICD-10-CM | POA: Diagnosis not present

## 2021-08-22 DIAGNOSIS — R3912 Poor urinary stream: Secondary | ICD-10-CM | POA: Diagnosis not present

## 2021-08-22 DIAGNOSIS — N401 Enlarged prostate with lower urinary tract symptoms: Secondary | ICD-10-CM | POA: Diagnosis not present

## 2021-08-22 DIAGNOSIS — R972 Elevated prostate specific antigen [PSA]: Secondary | ICD-10-CM | POA: Diagnosis not present

## 2021-08-30 DIAGNOSIS — H02112 Cicatricial ectropion of right lower eyelid: Secondary | ICD-10-CM | POA: Diagnosis not present

## 2021-08-30 DIAGNOSIS — D485 Neoplasm of uncertain behavior of skin: Secondary | ICD-10-CM | POA: Diagnosis not present

## 2021-08-30 DIAGNOSIS — H02115 Cicatricial ectropion of left lower eyelid: Secondary | ICD-10-CM | POA: Diagnosis not present

## 2021-08-30 DIAGNOSIS — H02135 Senile ectropion of left lower eyelid: Secondary | ICD-10-CM | POA: Diagnosis not present

## 2021-08-30 DIAGNOSIS — H02132 Senile ectropion of right lower eyelid: Secondary | ICD-10-CM | POA: Diagnosis not present

## 2021-09-03 ENCOUNTER — Other Ambulatory Visit: Payer: Self-pay | Admitting: Internal Medicine
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
## 2021-09-19 ENCOUNTER — Encounter: Payer: Self-pay | Admitting: Internal Medicine

## 2021-09-19 ENCOUNTER — Ambulatory Visit: Payer: Medicare PPO | Admitting: Internal Medicine

## 2021-09-19 VITALS — BP 138/80 | HR 68 | Ht 64.0 in | Wt 211.0 lb

## 2021-09-19 DIAGNOSIS — J309 Allergic rhinitis, unspecified: Secondary | ICD-10-CM

## 2021-09-19 DIAGNOSIS — J4520 Mild intermittent asthma, uncomplicated: Secondary | ICD-10-CM

## 2021-09-19 DIAGNOSIS — G4733 Obstructive sleep apnea (adult) (pediatric): Secondary | ICD-10-CM

## 2021-09-19 NOTE — Assessment & Plan Note (Signed)
He continues to feel benefit from CPAP with no changes requested. Plan-continue auto 10-20

## 2021-09-19 NOTE — Patient Instructions (Addendum)
Order- We can continue CPAP auto 10-20  Order- referral to [facility] Dr Wilburn Cornelia, Skotnicki et al     dx perennial rhinitis  Good luck with your joint issues- I hope they can get better soon.  Please call if we can help

## 2021-09-25 ENCOUNTER — Other Ambulatory Visit: Payer: Self-pay | Admitting: Internal Medicine

## 2021-10-18 ENCOUNTER — Encounter: Payer: Self-pay | Admitting: Internal Medicine

## 2021-10-18 DIAGNOSIS — J00 Acute nasopharyngitis [common cold]: Secondary | ICD-10-CM | POA: Diagnosis not present

## 2021-10-18 DIAGNOSIS — Z6836 Body mass index (BMI) 36.0-36.9, adult: Secondary | ICD-10-CM | POA: Diagnosis not present

## 2021-10-18 DIAGNOSIS — F411 Generalized anxiety disorder: Secondary | ICD-10-CM | POA: Diagnosis not present

## 2021-10-18 DIAGNOSIS — N400 Enlarged prostate without lower urinary tract symptoms: Secondary | ICD-10-CM | POA: Diagnosis not present

## 2021-10-18 DIAGNOSIS — M1990 Unspecified osteoarthritis, unspecified site: Secondary | ICD-10-CM | POA: Diagnosis not present

## 2021-10-18 DIAGNOSIS — J45909 Unspecified asthma, uncomplicated: Secondary | ICD-10-CM | POA: Diagnosis not present

## 2021-10-18 DIAGNOSIS — I10 Essential (primary) hypertension: Secondary | ICD-10-CM | POA: Diagnosis not present

## 2021-10-18 DIAGNOSIS — F325 Major depressive disorder, single episode, in full remission: Secondary | ICD-10-CM | POA: Diagnosis not present

## 2021-10-21 ENCOUNTER — Other Ambulatory Visit: Payer: Self-pay | Admitting: Ophthalmology

## 2021-10-21 DIAGNOSIS — H02132 Senile ectropion of right lower eyelid: Secondary | ICD-10-CM | POA: Diagnosis not present

## 2021-10-21 DIAGNOSIS — D485 Neoplasm of uncertain behavior of skin: Secondary | ICD-10-CM | POA: Diagnosis not present

## 2021-10-21 DIAGNOSIS — H02115 Cicatricial ectropion of left lower eyelid: Secondary | ICD-10-CM | POA: Diagnosis not present

## 2021-10-21 DIAGNOSIS — H16211 Exposure keratoconjunctivitis, right eye: Secondary | ICD-10-CM | POA: Diagnosis not present

## 2021-10-21 DIAGNOSIS — G510 Bell's palsy: Secondary | ICD-10-CM | POA: Diagnosis not present

## 2021-10-21 DIAGNOSIS — H04562 Stenosis of left lacrimal punctum: Secondary | ICD-10-CM | POA: Diagnosis not present

## 2021-10-21 DIAGNOSIS — H02135 Senile ectropion of left lower eyelid: Secondary | ICD-10-CM | POA: Diagnosis not present

## 2021-10-21 DIAGNOSIS — H02142 Spastic ectropion of right lower eyelid: Secondary | ICD-10-CM | POA: Diagnosis not present

## 2021-10-21 DIAGNOSIS — H02235 Paralytic lagophthalmos left lower eyelid: Secondary | ICD-10-CM | POA: Diagnosis not present

## 2021-10-21 DIAGNOSIS — H02535 Eyelid retraction left lower eyelid: Secondary | ICD-10-CM | POA: Diagnosis not present

## 2021-10-21 DIAGNOSIS — B079 Viral wart, unspecified: Secondary | ICD-10-CM | POA: Diagnosis not present

## 2021-10-21 DIAGNOSIS — H02035 Senile entropion of left lower eyelid: Secondary | ICD-10-CM | POA: Diagnosis not present

## 2021-10-21 DIAGNOSIS — B078 Other viral warts: Secondary | ICD-10-CM | POA: Diagnosis not present

## 2021-10-21 DIAGNOSIS — H02112 Cicatricial ectropion of right lower eyelid: Secondary | ICD-10-CM | POA: Diagnosis not present

## 2021-10-21 DIAGNOSIS — H11822 Conjunctivochalasis, left eye: Secondary | ICD-10-CM | POA: Diagnosis not present

## 2021-10-21 DIAGNOSIS — H02145 Spastic ectropion of left lower eyelid: Secondary | ICD-10-CM | POA: Diagnosis not present

## 2021-10-22 ENCOUNTER — Telehealth: Payer: Self-pay

## 2021-10-22 NOTE — Telephone Encounter (Signed)
Pre op form in folder patient appt scheduled 11/06/21

## 2021-10-23 DIAGNOSIS — Z85828 Personal history of other malignant neoplasm of skin: Secondary | ICD-10-CM | POA: Diagnosis not present

## 2021-10-23 DIAGNOSIS — D692 Other nonthrombocytopenic purpura: Secondary | ICD-10-CM | POA: Diagnosis not present

## 2021-10-23 DIAGNOSIS — L821 Other seborrheic keratosis: Secondary | ICD-10-CM | POA: Diagnosis not present

## 2021-10-23 DIAGNOSIS — D225 Melanocytic nevi of trunk: Secondary | ICD-10-CM | POA: Diagnosis not present

## 2021-10-23 DIAGNOSIS — L309 Dermatitis, unspecified: Secondary | ICD-10-CM | POA: Diagnosis not present

## 2021-11-06 ENCOUNTER — Ambulatory Visit: Payer: Medicare PPO | Admitting: Internal Medicine

## 2021-11-06 ENCOUNTER — Encounter: Payer: Self-pay | Admitting: Internal Medicine

## 2021-11-06 VITALS — BP 130/80 | HR 70 | Temp 98.6°F | Wt 205.8 lb

## 2021-11-06 DIAGNOSIS — R718 Other abnormality of red blood cells: Secondary | ICD-10-CM

## 2021-11-06 DIAGNOSIS — D696 Thrombocytopenia, unspecified: Secondary | ICD-10-CM

## 2021-11-06 DIAGNOSIS — Z79899 Other long term (current) drug therapy: Secondary | ICD-10-CM | POA: Diagnosis not present

## 2021-11-06 DIAGNOSIS — I10 Essential (primary) hypertension: Secondary | ICD-10-CM | POA: Diagnosis not present

## 2021-11-06 DIAGNOSIS — T84018S Broken internal joint prosthesis, other site, sequela: Secondary | ICD-10-CM

## 2021-11-06 DIAGNOSIS — J45909 Unspecified asthma, uncomplicated: Secondary | ICD-10-CM

## 2021-11-06 DIAGNOSIS — G4733 Obstructive sleep apnea (adult) (pediatric): Secondary | ICD-10-CM | POA: Diagnosis not present

## 2021-11-06 DIAGNOSIS — Z96659 Presence of unspecified artificial knee joint: Secondary | ICD-10-CM

## 2021-11-06 DIAGNOSIS — Z01818 Encounter for other preprocedural examination: Secondary | ICD-10-CM | POA: Diagnosis not present

## 2021-11-06 DIAGNOSIS — Z23 Encounter for immunization: Secondary | ICD-10-CM

## 2021-11-06 LAB — CBC WITH DIFFERENTIAL/PLATELET
Basophils Absolute: 0 10*3/uL (ref 0.0–0.1)
Basophils Relative: 0.7 % (ref 0.0–3.0)
Eosinophils Absolute: 0.5 10*3/uL (ref 0.0–0.7)
Eosinophils Relative: 7.2 % — ABNORMAL HIGH (ref 0.0–5.0)
HCT: 41.5 % (ref 39.0–52.0)
Hemoglobin: 14.4 g/dL (ref 13.0–17.0)
Lymphocytes Relative: 15.1 % (ref 12.0–46.0)
Lymphs Abs: 0.9 10*3/uL (ref 0.7–4.0)
MCHC: 34.6 g/dL (ref 30.0–36.0)
MCV: 106.8 fl — ABNORMAL HIGH (ref 78.0–100.0)
Monocytes Absolute: 0.9 10*3/uL (ref 0.1–1.0)
Monocytes Relative: 14.3 % — ABNORMAL HIGH (ref 3.0–12.0)
Neutro Abs: 4 10*3/uL (ref 1.4–7.7)
Neutrophils Relative %: 62.7 % (ref 43.0–77.0)
Platelets: 170 10*3/uL (ref 150.0–400.0)
RBC: 3.89 Mil/uL — ABNORMAL LOW (ref 4.22–5.81)
RDW: 14.6 % (ref 11.5–15.5)
WBC: 6.3 10*3/uL (ref 4.0–10.5)

## 2021-11-06 LAB — HEPATIC FUNCTION PANEL
ALT: 20 U/L (ref 0–53)
AST: 18 U/L (ref 0–37)
Albumin: 4.6 g/dL (ref 3.5–5.2)
Alkaline Phosphatase: 65 U/L (ref 39–117)
Bilirubin, Direct: 0.2 mg/dL (ref 0.0–0.3)
Total Bilirubin: 0.8 mg/dL (ref 0.2–1.2)
Total Protein: 7.2 g/dL (ref 6.0–8.3)

## 2021-11-06 LAB — HEMOGLOBIN A1C: Hgb A1c MFr Bld: 5.5 % (ref 4.6–6.5)

## 2021-11-06 LAB — LIPID PANEL
Cholesterol: 189 mg/dL (ref 0–200)
HDL: 54.6 mg/dL (ref >39.00)
LDL Cholesterol: 125 mg/dL — ABNORMAL HIGH (ref 0–99)
NonHDL: 134.39
Total CHOL/HDL Ratio: 3
Triglycerides: 47 mg/dL (ref 0.0–149.0)
VLDL: 9.4 mg/dL (ref 0.0–40.0)

## 2021-11-06 LAB — BASIC METABOLIC PANEL
BUN: 12 mg/dL (ref 6–23)
CO2: 29 mEq/L (ref 19–32)
Calcium: 9.7 mg/dL (ref 8.4–10.5)
Chloride: 101 mEq/L (ref 96–112)
Creatinine, Ser: 0.7 mg/dL (ref 0.40–1.50)
GFR: 93.61 mL/min (ref >60.00)
Glucose, Bld: 84 mg/dL (ref 70–99)
Potassium: 5 mEq/L (ref 3.5–5.1)
Sodium: 137 mEq/L (ref 135–145)

## 2021-11-06 NOTE — Progress Notes (Signed)
Chief Complaint  Patient presents with   Medical Clearance    Left knee    HPI: Patient  [name]  71 y.o. comes in today for preoperatvie evaluation for surgery , left tk revision in December 05 2021 per Dr Alvan Dame.   Had left eyelid surgery  ectropion.  Healing in past weeks .   Exercise tolerance  was very good   cycling 1000 miles and knee  began bothering him   so went to ortho and was dx with loose prosthesis ( orig tka was  nov 2011 . )  When  working on shoulder   and then tore rc subscapularis.   And Dr Onnie Graham.   Advised revision   but  not hot on doing this at this time.  Has limited ability to cycle . Gained back some weight  Asthma pulmonary status :stable no flares  recently able for xcuba diving   In regard to sinuses      to see dr shoemaker in fall .    No excess bleeding but continues to have some purpura on hands arms  when active  no petechia  .( See noes Dr Irene Limbo)  Taking b complex  and b12 .    Would like to make sure utd on immunizations .  Has had  dental work lower implants and upper dentures   Health Maintenance  Topic Date Due   INFLUENZA VACCINE  06/29/2022 (Originally 10/29/2021)   COLONOSCOPY (Pts 45-62yr Insurance coverage will need to be confirmed)  06/24/2022   TETANUS/TDAP  09/09/2027   Pneumonia Vaccine 71 Years old  Completed   COVID-19 Vaccine  Completed   Hepatitis C Screening  Completed   Zoster Vaccines- Shingrix  Completed   HPV VACCINES  Aged Out      ROS:  GEN/ HEENT: No fever, significant weight changes sweats headaches vision problems hearing changes, CV/ PULM; No chest pain shortness of breath cough, syncope,edema  change in exercise tolerance. GI /GU: No adominal pain, vomiting, change in bowel habits. No blood in the stool. Followed by urology for elevated PSA  SKIN/HEME: ,see above . No lymphadenopathy, nodules, masses.  NEURO/ PSYCH:  No neurologic signs such as weakness numbness. No depression anxiety. IMM/ Allergy:  No unusual infections.  Allergy .  Asthma flare  sees dr young  REST of 12 system review negative except as per HPI   Past Medical History:  Diagnosis Date   Allergy    Arthritis    knees   Asthma    Depression    Elevated PSA    u[place] negative 2011   Hypertension    nl cardiolite in 1998   Pneumonia 2005   right hospitalized    Rhinosinusitis    Sleep apnea    wears CPAP    Past Surgical History:  Procedure Laterality Date   COLONOSCOPY  03/09/2013   ECTROPION REPAIR Left    2023   FACIAL RECONSTRUCTION SURGERY     NASAL SINUS SURGERY     REPLACEMENT TOTAL KNEE  01/2010   left; Dr OAlvan Dame RIght knee 06-2010   shoulder atrhroscopic surgery  2014   Right   VENTRAL HERNIA REPAIR  03/2007   rerepair winter 2010    Family History  Problem Relation Age of Onset   Heart failure Father        MV replacement   Myelodysplastic syndrome Father    Asthma Mother    Polycythemia Brother  Asthma Other    Hypertension Other    Diabetes Maternal Grandmother    Healthy Sister    Healthy Brother    Healthy Brother    Colon cancer Neg Hx    Pancreatic cancer Neg Hx    Stomach cancer Neg Hx    Esophageal cancer Neg Hx    Colon polyps Neg Hx    Rectal cancer Neg Hx     Social History   Socioeconomic History   Marital status: Single    Spouse name: Not on file   Number of children: Not on file   Years of education: Not on file   Highest education level: Bachelor's degree (e.g., BA, AB, BS)  Occupational History   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   Stopped drinking in 1986    Nonosmoker    Single    HHof 1    Social Determinants of Health   Financial Resource Strain: Low Risk  (11/04/2021)   Overall Financial Resource Strain (CARDIA)    Difficulty of Paying Living Expenses: Not very hard  Food Insecurity: No Food Insecurity (11/04/2021)   Hunger Vital Sign    Worried About Running Out of Food in the Last Year: Never true    Ran Out of Food in the Last Year: Never true  Transportation Needs: No Transportation Needs (11/04/2021)   PRAPARE - Hydrologist (Medical): No    Lack of Transportation (Non-Medical): No  Physical Activity: Sufficiently Active (11/04/2021)   Exercise Vital Sign    Days of Exercise per Week: 5 days    Minutes of Exercise per Session: 60 min  Stress: No Stress Concern Present (11/04/2021)   [place]    Feeling of Stress : Not at all  Social Connections: Moderately Isolated (11/04/2021)   Social Connection and Isolation Panel [NHANES]    Frequency of Communication with Friends and Family: More than three times a week    Frequency of Social Gatherings with Friends and Family: Three times a week    Attends Religious Services: Never    Active Member of Clubs or Organizations: Yes    Attends Music therapist: More than 4 times per year    Marital Status: Divorced    Outpatient Medications Prior to Visit  Medication Sig Dispense Refill   amLODipine-benazepril (LOTREL) 5-10 MG capsule TAKE 1 CAPSULE BY MOUTH EVERY DAY 90 capsule 2   azelastine (ASTELIN) 0.1 % nasal spray Place 1-2 sprays into both nostrils 2 (two) times daily as needed for rhinitis. Use in each nostril as directed 30 mL 12   clobetasol (OLUX) 0.05 % topical foam Apply topically as needed.     COMBIVENT RESPIMAT 20-100 MCG/ACT AERS respimat INHALE 2 PUFFS UP TO FOUR TIMES A DAY AS NEEDED 4 g 5   escitalopram (LEXAPRO) 10 MG tablet TAKE 1 TABLET BY MOUTH EVERY DAY 90 tablet 2   fexofenadine (ALLEGRA)  180 MG tablet Take 180 mg by mouth daily.  fluticasone (FLONASE) 50 MCG/ACT nasal spray SPRAY 2 SPRAYS INTO EACH NOSTRIL EVERY DAY 16 mL 12   guaiFENesin (MUCINEX) 600 MG 12 hr tablet Take 1,200 mg by mouth daily.     ibuprofen (ADVIL) 800 MG tablet TAKE 1 TABLET BY MOUTH EVERY 8 HOURS AS NEEDED 60 tablet 0   silodosin (RAPAFLO) 8 MG CAPS capsule Take 1 capsule (8 mg total) by mouth at bedtime. 30 capsule 11   triamcinolone cream (KENALOG) 0.1 % Apply 1 application topically as needed.     WIXELA INHUB 100-50 MCG/ACT AEPB INHALE 1 PUFF BY MOUTH TWICE A DAY 60 each 6   No facility-administered medications prior to visit.     EXAM:  BP 130/80 (BP Location: Left Arm, Patient Position: Sitting, Cuff Size: Normal)   Pulse 70   Temp 98.6 F (37 C) (Oral)   Wt 205 lb 12.8 oz (93.4 kg)   SpO2 98%   BMI 35.33 kg/m   Body mass index is 35.33 kg/m. Wt Readings from Last 3 Encounters:  11/06/21 205 lb 12.8 oz (93.4 kg)  09/19/21 211 lb (95.7 kg)  06/06/21 199 lb (90.3 kg)    Physical Exam: Vital signs reviewed TGG:YIRS is a well-developed well-nourished alert cooperative    who appearsr stated age in no acute distress.  HEENT: normocephalic atraumatic , Eyes: left eye lid reddened post surgical changes  Nares: paten,t no deformity discharge or tenderness., Ears:  EAC's clear TMs with normal landmarks.NECK: supple without masses, thyromegaly or bruits. CHEST/PULM:  Clear to auscultation and percussion breath sounds equal rare wheeze , rales or rhonchi. . CV: PMI is nondisplaced, S1 S2 no gallops, murmurs, rubs. Peripheral pulses are full without delay.No JVD .  ABDOMEN: Bowel sounds normal nontender  No guard or rebound, no hepato splenomegal no CVA tenderness.   Extremtities:  No clubbing cyanosis or edema, left knee enlarged not hot or red  well healed scar  NEURO:  Oriented x3, cranial nerves 3-12 appear to be intact, no obvious focal weakness,gait within normal limits  SKIN: No  acute rashes normal turgor, color,  small senile like ecchymosis  and hand erythema  PSYCH: Oriented, good eye contact, no obvious depression anxiety, cognition and judgment appear normal. LN: no cervical axillary i adenopathy  Lab Results  Component Value Date   WBC 6.3 11/06/2021   HGB 14.4 11/06/2021   HCT 41.5 11/06/2021   PLT 170.0 11/06/2021   GLUCOSE 84 11/06/2021   CHOL 189 11/06/2021   TRIG 47.0 11/06/2021   HDL 54.60 11/06/2021   LDLCALC 125 (H) 11/06/2021   ALT 20 11/06/2021   AST 18 11/06/2021   NA 137 11/06/2021   K 5.0 11/06/2021   CL 101 11/06/2021   CREATININE 0.70 11/06/2021   BUN 12 11/06/2021   CO2 29 11/06/2021   TSH 2.77 12/24/2020   PSA 6.08 (H) 06/30/2019   INR 1.0 12/24/2020   HGBA1C 5.5 11/06/2021    BP Readings from Last 3 Encounters:  11/06/21 130/80  09/19/21 138/80  12/24/20 110/68    Lab  plan reviewed with patient   ASSESSMENT AND PLAN:  Discussed the following assessment and plan:    ICD-10-CM   1. Preoperative examination  W54.627 Basic metabolic panel    CBC with Differential/Platelet    Hemoglobin A1c    Hepatic function panel    Lipid panel    Lipid panel    Hepatic function panel    Hemoglobin A1c    CBC with Differential/Platelet  Basic metabolic panel    2. Failure of total knee replacement, sequela  G26.948N Basic metabolic panel   I62.703 CBC with Differential/Platelet    Hemoglobin A1c    Hepatic function panel    Lipid panel    Lipid panel    Hepatic function panel    Hemoglobin A1c    CBC with Differential/Platelet    Basic metabolic panel    3. Essential hypertension  J00 Basic metabolic panel    CBC with Differential/Platelet    Hemoglobin A1c    Hepatic function panel    Lipid panel    Lipid panel    Hepatic function panel    Hemoglobin A1c    CBC with Differential/Platelet    Basic metabolic panel    4. Asthma, chronic, unspecified asthma severity, uncomplicated  X38.182 Basic metabolic panel     CBC with Differential/Platelet    Hemoglobin A1c    Hepatic function panel    Lipid panel    Lipid panel    Hepatic function panel    Hemoglobin A1c    CBC with Differential/Platelet    Basic metabolic panel    5. Obstructive sleep apnea  X93.71 Basic metabolic panel    CBC with Differential/Platelet    Hemoglobin A1c    Hepatic function panel    Lipid panel    Lipid panel    Hepatic function panel    Hemoglobin A1c    CBC with Differential/Platelet    Basic metabolic panel    6. Medication management  I96.789 Basic metabolic panel    CBC with Differential/Platelet    Hemoglobin A1c    Hepatic function panel    Lipid panel    Lipid panel    Hepatic function panel    Hemoglobin A1c    CBC with Differential/Platelet    Basic metabolic panel    7. Thrombocytopenia (HCC)  F81.0 Basic metabolic panel    CBC with Differential/Platelet    Hemoglobin A1c    Hepatic function panel    Lipid panel    Lipid panel    Hepatic function panel    Hemoglobin A1c    CBC with Differential/Platelet    Basic metabolic panel   resolved on  current testing    8. Need for prophylactic vaccination against Streptococcus pneumoniae (pneumococcus)  Z23 Pneumococcal conjugate vaccine 20-valent (Prevnar 20)    9. Elevated MCV  R71.8     Condition stable acceptable risk for surgery cardiovascular pulmonary stable Will complete preop form and fax to Dr. Honor Loh office . Optimization of medical status  for surgery .  Return for when due for cpx in fall winter .  Patient Care Team: Alda Gaultney, Standley Brooking, MD as PCP - General orthopedic Festus Aloe, MD as Attending Physician (Urology) Deneise Lever, MD as Attending Physician (Pulmonary Disease) Patient Instructions  Good to see you again  Updated lab today . Will send information  to Dr Alvan Dame office .  Good luck with   surgery and getting back. To favorite exercise .    Standley Brooking. Adaira Centola M.D.

## 2021-11-06 NOTE — Patient Instructions (Signed)
Good to see you again  Updated lab today . Will send information  to Dr Alvan Dame office .  Good luck with   surgery and getting back. To favorite exercise .

## 2021-11-06 NOTE — Telephone Encounter (Signed)
Form on cma desk please fax

## 2021-11-07 NOTE — Telephone Encounter (Signed)
Form was faxed to [phone]

## 2021-11-08 ENCOUNTER — Other Ambulatory Visit: Payer: Self-pay | Admitting: Internal Medicine

## 2021-11-08 NOTE — Progress Notes (Signed)
Blood results stable  platelet count normal . Cholesterol could be better   no diabetes liver renal function normal   Can fu at your cpx in the fall.  Will Forwarding to  ortho team

## 2021-11-13 ENCOUNTER — Other Ambulatory Visit: Payer: Self-pay | Admitting: Internal Medicine

## 2021-11-21 ENCOUNTER — Other Ambulatory Visit: Payer: Self-pay | Admitting: Internal Medicine

## 2021-11-21 NOTE — Progress Notes (Addendum)
COVID Vaccine Completed: yes x4  Date of COVID positive in last 90 days: no  PCP - [name] Ace, MD Cardiologist - n/a  Medical clearance by [name] 11/06/21 in Epic  Chest x-ray - n/a EKG - 11/22/21 Epic/chart Stress Test - more than 10 years ago per pt ECHO - n/a Cardiac Cath - n/a Pacemaker/ICD device last checked: n/a Spinal Cord Stimulator: n/a  Bowel Prep - no  Sleep Study - yes, positive CPAP - yes every night   Fasting Blood Sugar - n/a Checks Blood Sugar _____ times a day  Blood Thinner Instructions: n/a Aspirin Instructions: Last Dose:  Activity level: Can go up a flight of stairs and perform activities of daily living without stopping and without symptoms of chest pain or shortness of breath.  Anesthesia review: 1st degree Av block, OSA, HTN, thrombocytopenia   Patient denies shortness of breath, fever, cough and chest pain at PAT appointment  Patient verbalized understanding of instructions that were given to them at the PAT appointment. Patient was also instructed that they will need to review over the PAT instructions again at home before surgery.

## 2021-11-21 NOTE — Patient Instructions (Addendum)
SURGICAL WAITING ROOM VISITATION Patients having surgery or a procedure may have no more than 2 support people in the waiting area - these visitors may rotate.   Children under the age of 47 must have an adult with them who is not the patient. If the patient needs to stay at the hospital during part of their recovery, the visitor guidelines for inpatient rooms apply. Pre-op nurse will coordinate an appropriate time for 1 support person to accompany patient in pre-op.  This support person may not rotate.    Please refer to the [facility] website for the visitor guidelines for Inpatients (after your surgery is over and you are in a regular room).     Your procedure is scheduled on: 12/05/21   Report to [facility] Main Entrance    Report to admitting at 6:10 AM   Call this number if you have problems the morning of surgery [phone]   Do not eat food :After Midnight.   After Midnight you may have the following liquids until 5:40 AM DAY OF SURGERY  Water Non-Citrus Juices (without pulp, NO RED) Carbonated Beverages Black Coffee (NO MILK/CREAM OR CREAMERS, sugar ok)  Clear Tea (NO MILK/CREAM OR CREAMERS, sugar ok) regular and decaf                             Plain Jell-O (NO RED)                                           Fruit ices (not with fruit pulp, NO RED)                                     Popsicles (NO RED)                                                               Sports drinks like Gatorade (NO RED)                 The day of surgery:  Drink ONE (1) Pre-Surgery Clear Ensure at 5:40 AM the morning of surgery. Drink in one sitting. Do not sip.  This drink was given to you during your hospital  pre-op appointment visit. Nothing else to drink after completing the  Pre-Surgery Clear Ensure.          If you have questions, please contact your surgeon's office.   FOLLOW BOWEL PREP AND ANY ADDITIONAL PRE OP INSTRUCTIONS YOU RECEIVED FROM YOUR SURGEON'S OFFICE!!!      Oral Hygiene is also important to reduce your risk of infection.                                    Remember - BRUSH YOUR TEETH THE MORNING OF SURGERY WITH YOUR REGULAR TOOTHPASTE   Take these medicines the morning of surgery with A SIP OF WATER: Lexapro, Allegra  Bring CPAP mask and tubing day of surgery.  You may not have any metal on your body including jewelry, and body piercing             Do not wear lotions, powders, cologne, or deodorant              Men may shave face and neck.   Do not bring valuables to the hospital. [place].   Contacts, dentures or bridgework may not be worn into surgery.   Bring small overnight bag day of surgery.   DO NOT [place]. PHARMACY WILL DISPENSE MEDICATIONS LISTED ON YOUR MEDICATION LIST TO YOU DURING YOUR ADMISSION [place]!    Special Instructions: Bring a copy of your healthcare power of attorney and living will documents         the day of surgery if you haven't scanned them before.              Please read over the following fact sheets you were given: IF YOU HAVE QUESTIONS ABOUT YOUR PRE-OP INSTRUCTIONS PLEASE CALL [place] - Preparing for Surgery Before surgery, you can play an important role.  Because skin is not sterile, your skin needs to be as free of germs as possible.  You can reduce the number of germs on your skin by washing with CHG (chlorahexidine gluconate) soap before surgery.  CHG is an antiseptic cleaner which kills germs and bonds with the skin to continue killing germs even after washing. Please DO NOT use if you have an allergy to CHG or antibacterial soaps.  If your skin becomes reddened/irritated stop using the CHG and inform your nurse when you arrive at Short Stay. Do not shave (including legs and underarms) for at least 48 hours prior to the first CHG shower.  You  may shave your face/neck.  Please follow these instructions carefully:  1.  Shower with CHG Soap the night before surgery and the  morning of surgery.  2.  If you choose to wash your hair, wash your hair first as usual with your normal  shampoo.  3.  After you shampoo, rinse your hair and body thoroughly to remove the shampoo.                             4.  Use CHG as you would any other liquid soap.  You can apply chg directly to the skin and wash.  Gently with a scrungie or clean washcloth.  5.  Apply the CHG Soap to your body ONLY FROM THE NECK DOWN.   Do   not use on face/ open                           Wound or open sores. Avoid contact with eyes, ears mouth and   genitals (private parts).                       Wash face,  Genitals (private parts) with your normal soap.             6.  Wash thoroughly, paying special attention to the area where your    surgery  will be performed.  7.  Thoroughly rinse your body with warm water from the  neck down.  8.  DO NOT shower/wash with your normal soap after using and rinsing off the CHG Soap.                9.  Pat yourself dry with a clean towel.            10.  Wear clean pajamas.            11.  Place clean sheets on your bed the night of your first shower and do not  sleep with pets. Day of Surgery : Do not apply any lotions/deodorants the morning of surgery.  Please wear clean clothes to the hospital/surgery center.  FAILURE TO FOLLOW THESE INSTRUCTIONS MAY RESULT IN THE CANCELLATION OF YOUR SURGERY  PATIENT SIGNATURE_________________________________  NURSE SIGNATURE__________________________________  ________________________________________________________________________   [name]  An incentive spirometer is a tool that can help keep your lungs clear and active. This tool measures how well you are filling your lungs with each breath. Taking long deep breaths may help reverse or decrease the chance of developing breathing  (pulmonary) problems (especially infection) following: A long period of time when you are unable to move or be active. BEFORE THE PROCEDURE  If the spirometer includes an indicator to show your best effort, your nurse or respiratory therapist will set it to a desired goal. If possible, sit up straight or lean slightly forward. Try not to slouch. Hold the incentive spirometer in an upright position. INSTRUCTIONS FOR USE  Sit on the edge of your bed if possible, or sit up as far as you can in bed or on a chair. Hold the incentive spirometer in an upright position. Breathe out normally. Place the mouthpiece in your mouth and seal your lips tightly around it. Breathe in slowly and as deeply as possible, raising the piston or the ball toward the top of the column. Hold your breath for 3-5 seconds or for as long as possible. Allow the piston or ball to fall to the bottom of the column. Remove the mouthpiece from your mouth and breathe out normally. Rest for a few seconds and repeat Steps 1 through 7 at least 10 times every 1-2 hours when you are awake. Take your time and take a few normal breaths between deep breaths. The spirometer may include an indicator to show your best effort. Use the indicator as a goal to work toward during each repetition. After each set of 10 deep breaths, practice coughing to be sure your lungs are clear. If you have an incision (the cut made at the time of surgery), support your incision when coughing by placing a pillow or rolled up towels firmly against it. Once you are able to get out of bed, walk around indoors and cough well. You may stop using the incentive spirometer when instructed by your caregiver.  RISKS AND COMPLICATIONS Take your time so you do not get dizzy or light-headed. If you are in pain, you may need to take or ask for pain medication before doing incentive spirometry. It is harder to take a deep breath if you are having pain. AFTER USE Rest and  breathe slowly and easily. It can be helpful to keep track of a log of your progress. Your caregiver can provide you with a simple table to help with this. If you are using the spirometer at home, follow these instructions: [place] IF:  You are having difficultly using the spirometer. You have trouble using the spirometer as often as  instructed. Your pain medication is not giving enough relief while using the spirometer. You develop fever of 100.5 F (38.1 C) or higher. SEEK IMMEDIATE MEDICAL CARE IF:  You cough up bloody sputum that had not been present before. You develop fever of 102 F (38.9 C) or greater. You develop worsening pain at or near the incision site. MAKE SURE YOU:  Understand these instructions. Will watch your condition. Will get help right away if you are not doing well or get worse. Document Released: 07/28/2006 Document Revised: 06/09/2011 Document Reviewed: 09/28/2006 ExitCare Patient Information 2014 [employer], [place].   ________________________________________________________________________  WHAT IS A BLOOD TRANSFUSION? Blood Transfusion Information  A transfusion is the replacement of blood or some of its parts. Blood is made up of multiple cells which provide different functions. Red blood cells carry oxygen and are used for blood loss replacement. White blood cells fight against infection. Platelets control bleeding. Plasma helps clot blood. Other blood products are available for specialized needs, such as hemophilia or other clotting disorders. BEFORE THE TRANSFUSION  Who gives blood for transfusions?  Healthy volunteers who are fully evaluated to make sure their blood is safe. This is blood bank blood. Transfusion therapy is the safest it has ever been in the practice of medicine. Before blood is taken from a donor, a complete history is taken to make sure that person has no history of diseases nor engages in risky social behavior (examples are  intravenous drug use or sexual activity with multiple partners). The donor's travel history is screened to minimize risk of transmitting infections, such as malaria. The donated blood is tested for signs of infectious diseases, such as HIV and hepatitis. The blood is then tested to be sure it is compatible with you in order to minimize the chance of a transfusion reaction. If you or a relative donates blood, this is often done in anticipation of surgery and is not appropriate for emergency situations. It takes many days to process the donated blood. RISKS AND COMPLICATIONS Although transfusion therapy is very safe and saves many lives, the main dangers of transfusion include:  Getting an infectious disease. Developing a transfusion reaction. This is an allergic reaction to something in the blood you were given. Every precaution is taken to prevent this. The decision to have a blood transfusion has been considered carefully by your caregiver before blood is given. Blood is not given unless the benefits outweigh the risks. AFTER THE TRANSFUSION Right after receiving a blood transfusion, you will usually feel much better and more energetic. This is especially true if your red blood cells have gotten low (anemic). The transfusion raises the level of the red blood cells which carry oxygen, and this usually causes an energy increase. The nurse administering the transfusion will monitor you carefully for complications. HOME CARE INSTRUCTIONS  No special instructions are needed after a transfusion. You may find your energy is better. Speak with your caregiver about any limitations on activity for underlying diseases you may have. SEEK MEDICAL CARE IF:  Your condition is not improving after your transfusion. You develop redness or irritation at the intravenous (IV) site. SEEK IMMEDIATE MEDICAL CARE IF:  Any of the following symptoms occur over the next 12 hours: Shaking chills. You have a temperature by  mouth above 102 F (38.9 C), not controlled by medicine. Chest, back, or muscle pain. People around you feel you are not acting correctly or are confused. Shortness of breath or difficulty breathing. Dizziness and fainting. You get  a rash or develop hives. You have a decrease in urine output. Your urine turns a dark color or changes to pink, red, or brown. Any of the following symptoms occur over the next 10 days: You have a temperature by mouth above 102 F (38.9 C), not controlled by medicine. Shortness of breath. Weakness after normal activity. The white part of the eye turns yellow (jaundice). You have a decrease in the amount of urine or are urinating less often. Your urine turns a dark color or changes to pink, red, or brown. Document Released: 03/14/2000 Document Revised: 06/09/2011 Document Reviewed: 11/01/2007 [facility] Patient Information 2014 [place], [place].  _______________________________________________________________________

## 2021-11-22 ENCOUNTER — Encounter (HOSPITAL_COMMUNITY)
Admission: RE | Admit: 2021-11-22 | Discharge: 2021-11-22 | Disposition: A | Discharge status: Home / Self Care | Payer: Medicare PPO | Source: Ambulatory Visit | Attending: Orthopedic Surgery | Admitting: Orthopedic Surgery

## 2021-11-22 ENCOUNTER — Encounter (HOSPITAL_COMMUNITY): Payer: Self-pay

## 2021-11-22 VITALS — BP 136/92 | HR 63 | Temp 98.6°F | Resp 14 | Ht 62.0 in | Wt 203.2 lb

## 2021-11-22 DIAGNOSIS — Z01818 Encounter for other preprocedural examination: Secondary | ICD-10-CM | POA: Diagnosis not present

## 2021-11-22 DIAGNOSIS — I10 Essential (primary) hypertension: Secondary | ICD-10-CM | POA: Diagnosis not present

## 2021-11-22 DIAGNOSIS — T84093D Other mechanical complication of internal left knee prosthesis, subsequent encounter: Secondary | ICD-10-CM | POA: Insufficient documentation

## 2021-11-22 DIAGNOSIS — Y828 Other medical devices associated with adverse incidents: Secondary | ICD-10-CM | POA: Insufficient documentation

## 2021-11-22 HISTORY — DX: Cardiac arrhythmia, unspecified: I49.9

## 2021-11-22 LAB — TYPE AND SCREEN
ABO/RH(D): O POS
Antibody Screen: NEGATIVE

## 2021-11-22 LAB — SURGICAL PCR SCREEN
MRSA, PCR: NEGATIVE
Staphylococcus aureus: POSITIVE — AB

## 2021-11-26 NOTE — Progress Notes (Signed)
STAPH+ results routed to Dr. Olin. 

## 2021-11-28 ENCOUNTER — Encounter: Payer: Self-pay | Admitting: Internal Medicine

## 2021-11-28 NOTE — H&P (Signed)
TOTAL KNEE REVISION ADMISSION H&P  Patient is being admitted for left revision total knee arthroplasty.  Subjective:  Chief Complaint:left knee pain.  HPI: [name], 71 y.o. male. He has a history of left TKA about 10 years ago by Dr. Alvan Dame. He began having pain in the knee over the past few years. He had a bone scan which showed increased uptake around both components.The knee was aspirated and sent for synovasure analysis, which showed no sign of infection.     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   SHOULDER IMPINGEMENT SYNDROME 03/14/2009   UNSPECIFIED THROMBOCYTOPENIA 12/12/2008   ANEMIA, MILD 09/22/2008   PROSTATE SPECIFIC ANTIGEN, ELEVATED 09/22/2008   KNEE PAIN, BILATERAL 07/28/2008   DEPRESSION 02/09/2007   Obstructive sleep apnea 02/09/2007   Essential hypertension 02/09/2007   Asthma, mild intermittent, well-controlled 02/09/2007   Chronic insomnia 02/09/2007   Past Medical History:  Diagnosis Date   Allergy    Arthritis    knees   Asthma    Depression    Dysrhythmia    Elevated PSA    [place] and bx negative 2011   Hypertension    nl cardiolite in 1998   Pneumonia 2005   right hospitalized    Rhinosinusitis    Sleep apnea    wears CPAP    Past Surgical History:  Procedure Laterality Date   COLONOSCOPY  03/09/2013   ECTROPION REPAIR Left    2023   FACIAL RECONSTRUCTION SURGERY     NASAL SINUS SURGERY     REPLACEMENT TOTAL KNEE  01/2010   left; Dr Alvan Dame;  RIght knee 06-2010   shoulder atrhroscopic surgery  2014   Right   VENTRAL HERNIA REPAIR  03/2007   rerepair winter 2010    No current facility-administered medications for this encounter.   Current Outpatient Medications  Medication Sig Dispense Refill Last Dose   amLODipine-benazepril (LOTREL) 5-10 MG capsule TAKE 1 CAPSULE BY MOUTH EVERY DAY 90 capsule 2    azelastine (ASTELIN) 0.1 % nasal spray Place 1-2 sprays into both nostrils 2 (two) times daily as needed for rhinitis. Use in each nostril as directed 30 mL 12    b complex vitamins capsule Take 1 capsule by mouth daily.      clobetasol (OLUX) 0.05 % topical foam Apply 1 application  topically daily as needed (scalp itching).      Cyanocobalamin (B-12) 1000 MCG SUBL Place 1,000 mcg under the tongue daily.      escitalopram (LEXAPRO) 10 MG tablet TAKE 1 TABLET BY MOUTH EVERY DAY 90 tablet 2    fexofenadine (ALLEGRA) 180 MG tablet Take 180 mg by mouth daily.      fluticasone (FLONASE) 50 MCG/ACT nasal spray SPRAY 2 SPRAYS INTO EACH NOSTRIL EVERY DAY 16 mL 12    guaiFENesin (MUCINEX) 600 MG 12 hr tablet Take 1,200 mg by mouth daily as needed for cough or to loosen phlegm.      ibuprofen (ADVIL) 800 MG tablet TAKE 1 TABLET BY MOUTH EVERY 8  HOURS AS NEEDED 60 tablet 0    silodosin (RAPAFLO) 8 MG CAPS capsule Take 1 capsule (8 mg total) by mouth at bedtime. 30 capsule 11    triamcinolone cream (KENALOG) 0.1 % Apply 1 application  topically daily as needed (itching).      WIXELA INHUB 100-50 MCG/ACT AEPB INHALE 1 PUFF BY MOUTH TWICE A DAY (Patient taking differently: Inhale 1 puff into the lungs daily.) 60 each 6    Ipratropium-Albuterol (COMBIVENT RESPIMAT) 20-100 MCG/ACT AERS respimat INHALE 2 PUFFS UP TO FOUR TIMES A DAY AS NEEDED 4 g 11    Allergies  Allergen Reactions   Eggs Or Egg-Derived Products     Nausea and diarrhea   Tape     Causes bleeding and redness    Social History   Tobacco Use   Smoking status: Never   Smokeless  tobacco: Never  Substance Use Topics   Alcohol use: No    Comment: Quit drinking ETOH 1986    Family History  Problem Relation Age of Onset   Heart failure Father        MV replacement   Myelodysplastic syndrome Father    Asthma Mother    Polycythemia Brother    Asthma Other    Hypertension Other    Diabetes Maternal Grandmother    Healthy Sister    Healthy Brother    Healthy Brother    Colon cancer Neg Hx    Pancreatic cancer Neg Hx    Stomach cancer Neg Hx    Esophageal cancer Neg Hx    Colon polyps Neg Hx    Rectal cancer Neg Hx       Review of Systems  Constitutional:  Negative for chills and fever.  Respiratory:  Negative for cough and shortness of breath.   Cardiovascular:  Negative for chest pain.  Gastrointestinal:  Negative for nausea and vomiting.  Musculoskeletal:  Positive for arthralgias.      Objective:  Physical Exam Well nourished and well developed. General: Alert and oriented x3, cooperative and pleasant, no acute distress. Head: normocephalic, atraumatic, neck supple. Eyes: EOMI.  Musculoskeletal: Left knee exam: Palpable effusion without warmth erythema Midline incision is well-healed He does have some noted stiffness on exam with near full active extension with good quad tone and flexion limited over 100 degrees with tightness Tenderness to palpation around his knee more medial Stable medial and lateral collateral ligaments   Calves soft and nontender. Motor function intact in LE. Strength 5/5 LE bilaterally. Neuro: Distal pulses 2+. Sensation to light touch intact in LE.  Vital signs in last 24 hours:    Labs:  Estimated body mass index is 37.17 kg/m as calculated from the following:   Height as of 11/22/21: '5\' 2"'$  (1.575 m).   Weight as of 11/22/21: 92.2 kg.  Imaging Review Imaging:  Standing AP of both knees and standing lateral of the left knee were ordered and reviewed. Review of these radiographs did not indicate any obvious  progression of loosening.     Previously performed bone scan was reviewed that it indicated increased uptake around his left knee components on the medial aspect of the tibia and femoral component    Assessment/Plan:  Aseptic loosening, left knee(s) with failed previous arthroplasty.   The patient history, physical examination, clinical judgment of the provider and imaging studies are consistent with aseptic loosening of the left knee(s), previous total knee arthroplasty. Revision total knee arthroplasty is deemed medically necessary. The treatment options including  medical management, injection therapy, arthroscopy and revision arthroplasty were discussed at length. The risks and benefits of revision total knee arthroplasty were presented and reviewed. The risks due to aseptic loosening, infection, stiffness, patella tracking problems, thromboembolic complications and other imponderables were discussed. The patient acknowledged the explanation, agreed to proceed with the plan and consent was signed. Patient is being admitted for inpatient treatment for surgery, pain control, PT, OT, prophylactic antibiotics, VTE prophylaxis, progressive ambulation and ADL's and discharge planning.The patient is planning to be discharged  home.  Therapy Plans: outpatient therapy Disposition: Home with sister & mother for a few days (will be asking friends, neighbors vs ub[employer]/[facility] for PT visits) Planned DVT Prophylaxis: aspirin '81mg'$  BID DME needed: walker PCP: Dr. Regis Bill, clearance received Dentist: Dr. Sarajane Jews // oral surgeon - Drab (upper dentures and lower partial at this point) TXA: IV Allergies: NKDA Anesthesia Concerns: reports difficult spinal in the past but would like BMI: 36.1 Last HgbA1c: Not diabetic   Other: - Recent right rotator cuff tear - gentle positioning - OSA - CPAP - Hx of left eye surgery - lower eyelid - ** Wants to keep his implants ** - Very active - scuba diving, biking,  body combat - Oxycodone, robaxin, tylenol, celebrex - Tells me he has history of RBBB on EKG - Very afraid of knee infection - was concerned about nasal staph swab, No MRSA >> can put on oral abx x1 week as precaution   Costella Hatcher, PA-C Orthopedic Surgery [facility] [place] [phone]

## 2021-12-03 DIAGNOSIS — J3489 Other specified disorders of nose and nasal sinuses: Secondary | ICD-10-CM | POA: Diagnosis not present

## 2021-12-03 DIAGNOSIS — J309 Allergic rhinitis, unspecified: Secondary | ICD-10-CM | POA: Diagnosis not present

## 2021-12-03 DIAGNOSIS — G4733 Obstructive sleep apnea (adult) (pediatric): Secondary | ICD-10-CM | POA: Diagnosis not present

## 2021-12-04 ENCOUNTER — Encounter (HOSPITAL_COMMUNITY): Payer: Self-pay | Admitting: Orthopedic Surgery

## 2021-12-05 ENCOUNTER — Inpatient Hospital Stay (HOSPITAL_COMMUNITY): Payer: Medicare PPO | Admitting: Physician Assistant

## 2021-12-05 ENCOUNTER — Other Ambulatory Visit: Payer: Self-pay

## 2021-12-05 ENCOUNTER — Encounter (HOSPITAL_COMMUNITY): Payer: Self-pay | Admitting: Orthopedic Surgery

## 2021-12-05 ENCOUNTER — Inpatient Hospital Stay (HOSPITAL_COMMUNITY): Payer: Medicare PPO | Admitting: Anesthesiology

## 2021-12-05 ENCOUNTER — Inpatient Hospital Stay (HOSPITAL_COMMUNITY)
Admission: RE | Admit: 2021-12-05 | Discharge: 2021-12-06 | DRG: 468 | Disposition: A | Discharge status: Home / Self Care | Payer: Medicare PPO | Source: Ambulatory Visit | Attending: Orthopedic Surgery | Admitting: Orthopedic Surgery

## 2021-12-05 ENCOUNTER — Encounter (HOSPITAL_COMMUNITY)
Admission: RE | Disposition: A | Discharge status: Home / Self Care | Payer: Self-pay | Source: Ambulatory Visit | Attending: Orthopedic Surgery

## 2021-12-05 DIAGNOSIS — T84033A Mechanical loosening of internal left knee prosthetic joint, initial encounter: Secondary | ICD-10-CM

## 2021-12-05 DIAGNOSIS — Y792 Prosthetic and other implants, materials and accessory orthopedic devices associated with adverse incidents: Secondary | ICD-10-CM | POA: Diagnosis present

## 2021-12-05 DIAGNOSIS — I4510 Unspecified right bundle-branch block: Secondary | ICD-10-CM | POA: Diagnosis present

## 2021-12-05 DIAGNOSIS — Z8249 Family history of ischemic heart disease and other diseases of the circulatory system: Secondary | ICD-10-CM

## 2021-12-05 DIAGNOSIS — I10 Essential (primary) hypertension: Secondary | ICD-10-CM

## 2021-12-05 DIAGNOSIS — Z6837 Body mass index (BMI) 37.0-37.9, adult: Secondary | ICD-10-CM

## 2021-12-05 DIAGNOSIS — E669 Obesity, unspecified: Secondary | ICD-10-CM | POA: Diagnosis not present

## 2021-12-05 DIAGNOSIS — J4520 Mild intermittent asthma, uncomplicated: Secondary | ICD-10-CM | POA: Diagnosis present

## 2021-12-05 DIAGNOSIS — Z825 Family history of asthma and other chronic lower respiratory diseases: Secondary | ICD-10-CM | POA: Diagnosis not present

## 2021-12-05 DIAGNOSIS — Z833 Family history of diabetes mellitus: Secondary | ICD-10-CM

## 2021-12-05 DIAGNOSIS — Z91012 Allergy to eggs: Secondary | ICD-10-CM | POA: Diagnosis not present

## 2021-12-05 DIAGNOSIS — T84093D Other mechanical complication of internal left knee prosthesis, subsequent encounter: Principal | ICD-10-CM

## 2021-12-05 DIAGNOSIS — Z96652 Presence of left artificial knee joint: Secondary | ICD-10-CM | POA: Diagnosis not present

## 2021-12-05 DIAGNOSIS — Z9989 Dependence on other enabling machines and devices: Secondary | ICD-10-CM | POA: Diagnosis not present

## 2021-12-05 DIAGNOSIS — Z9109 Other allergy status, other than to drugs and biological substances: Secondary | ICD-10-CM | POA: Diagnosis not present

## 2021-12-05 DIAGNOSIS — G4733 Obstructive sleep apnea (adult) (pediatric): Secondary | ICD-10-CM | POA: Diagnosis present

## 2021-12-05 DIAGNOSIS — Z7951 Long term (current) use of inhaled steroids: Secondary | ICD-10-CM | POA: Diagnosis not present

## 2021-12-05 DIAGNOSIS — F32A Depression, unspecified: Secondary | ICD-10-CM | POA: Diagnosis present

## 2021-12-05 DIAGNOSIS — G8918 Other acute postprocedural pain: Secondary | ICD-10-CM | POA: Diagnosis not present

## 2021-12-05 HISTORY — PX: TOTAL KNEE REVISION: SHX996

## 2021-12-05 SURGERY — TOTAL KNEE REVISION
Anesthesia: Monitor Anesthesia Care | Site: Knee | Laterality: Left

## 2021-12-05 MED ORDER — OXYCODONE HCL 5 MG PO TABS
10.0000 mg | ORAL_TABLET | ORAL | Status: DC | PRN
  Filled 2021-12-05: qty 2

## 2021-12-05 MED ORDER — CHLORHEXIDINE GLUCONATE 0.12 % MT SOLN
15.0000 mL | Freq: Once | OROMUCOSAL | Status: AC
  Administered 2021-12-05: 15 mL via OROMUCOSAL

## 2021-12-05 MED ORDER — OXYCODONE HCL 5 MG/5ML PO SOLN: 5.0000 mg | Freq: Once | ORAL | Status: DC | PRN

## 2021-12-05 MED ORDER — PROPOFOL 10 MG/ML IV BOLUS
INTRAVENOUS | Status: AC
  Filled 2021-12-05: qty 20

## 2021-12-05 MED ORDER — FLUTICASONE FUROATE-VILANTEROL 100-25 MCG/ACT IN AEPB
1.0000 | INHALATION_SPRAY | Freq: Every day | RESPIRATORY_TRACT | Status: DC
  Administered 2021-12-06: 1 via RESPIRATORY_TRACT
  Filled 2021-12-05: qty 28

## 2021-12-05 MED ORDER — GLYCOPYRROLATE 0.2 MG/ML IJ SOLN
INTRAMUSCULAR | Status: DC | PRN
  Administered 2021-12-05 (×2): .1 mg via INTRAVENOUS

## 2021-12-05 MED ORDER — TRANEXAMIC ACID-NACL 1000-0.7 MG/100ML-% IV SOLN
1000.0000 mg | Freq: Once | INTRAVENOUS | Status: AC
  Administered 2021-12-05: 1000 mg via INTRAVENOUS
  Filled 2021-12-05: qty 100

## 2021-12-05 MED ORDER — SODIUM CHLORIDE 0.9 % IV SOLN: INTRAVENOUS | Status: DC

## 2021-12-05 MED ORDER — MENTHOL 3 MG MT LOZG: 1.0000 | LOZENGE | OROMUCOSAL | Status: DC | PRN

## 2021-12-05 MED ORDER — MIDAZOLAM HCL 2 MG/2ML IJ SOLN
1.0000 mg | INTRAMUSCULAR | Status: DC
  Filled 2021-12-05: qty 2

## 2021-12-05 MED ORDER — PROPOFOL 500 MG/50ML IV EMUL
INTRAVENOUS | Status: DC | PRN
  Administered 2021-12-05: 75 ug/kg/min via INTRAVENOUS

## 2021-12-05 MED ORDER — VANCOMYCIN HCL 1000 MG IV SOLR
INTRAVENOUS | Status: AC
  Filled 2021-12-05: qty 40

## 2021-12-05 MED ORDER — SODIUM CHLORIDE (PF) 0.9 % IJ SOLN
INTRAMUSCULAR | Status: DC | PRN
  Administered 2021-12-05: 30 mL

## 2021-12-05 MED ORDER — POVIDONE-IODINE 10 % EX SWAB
2.0000 | Freq: Once | CUTANEOUS | Status: AC
  Administered 2021-12-05: 2 via TOPICAL

## 2021-12-05 MED ORDER — IPRATROPIUM-ALBUTEROL 0.5-2.5 (3) MG/3ML IN SOLN: 3.0000 mL | Freq: Four times a day (QID) | RESPIRATORY_TRACT | Status: DC | PRN

## 2021-12-05 MED ORDER — ONDANSETRON HCL 4 MG/2ML IJ SOLN
INTRAMUSCULAR | Status: AC
  Filled 2021-12-05: qty 2

## 2021-12-05 MED ORDER — OXYCODONE HCL 5 MG PO TABS: 5.0000 mg | ORAL_TABLET | Freq: Once | ORAL | Status: DC | PRN

## 2021-12-05 MED ORDER — METOCLOPRAMIDE HCL 5 MG PO TABS: 5.0000 mg | ORAL_TABLET | Freq: Three times a day (TID) | ORAL | Status: DC | PRN

## 2021-12-05 MED ORDER — DEXAMETHASONE SODIUM PHOSPHATE 10 MG/ML IJ SOLN
INTRAMUSCULAR | Status: DC | PRN
  Administered 2021-12-05: 4 mg via INTRAVENOUS

## 2021-12-05 MED ORDER — 0.9 % SODIUM CHLORIDE (POUR BTL) OPTIME
TOPICAL | Status: DC | PRN
  Administered 2021-12-05: 1000 mL
  Administered 2021-12-05: 3000 mL

## 2021-12-05 MED ORDER — BUPIVACAINE IN DEXTROSE 0.75-8.25 % IT SOLN
INTRATHECAL | Status: DC | PRN
  Administered 2021-12-05: 2 mL via INTRATHECAL

## 2021-12-05 MED ORDER — TRANEXAMIC ACID-NACL 1000-0.7 MG/100ML-% IV SOLN
1000.0000 mg | INTRAVENOUS | Status: AC
  Administered 2021-12-05: 1000 mg via INTRAVENOUS
  Filled 2021-12-05: qty 100

## 2021-12-05 MED ORDER — ACETAMINOPHEN 500 MG PO TABS
1000.0000 mg | ORAL_TABLET | Freq: Four times a day (QID) | ORAL | Status: DC
  Administered 2021-12-05 – 2021-12-06 (×2): 1000 mg via ORAL
  Filled 2021-12-05 (×2): qty 2

## 2021-12-05 MED ORDER — PHENOL 1.4 % MT LIQD: 1.0000 | OROMUCOSAL | Status: DC | PRN

## 2021-12-05 MED ORDER — ONDANSETRON HCL 4 MG/2ML IJ SOLN
INTRAMUSCULAR | Status: DC | PRN
  Administered 2021-12-05: 4 mg via INTRAVENOUS

## 2021-12-05 MED ORDER — METHOCARBAMOL 500 MG PO TABS
500.0000 mg | ORAL_TABLET | Freq: Four times a day (QID) | ORAL | Status: DC | PRN
  Administered 2021-12-05 – 2021-12-06 (×2): 500 mg via ORAL
  Filled 2021-12-05 (×2): qty 1

## 2021-12-05 MED ORDER — LACTATED RINGERS IV SOLN: INTRAVENOUS | Status: DC

## 2021-12-05 MED ORDER — METHOCARBAMOL 500 MG IVPB - SIMPLE MED
500.0000 mg | Freq: Four times a day (QID) | INTRAVENOUS | Status: DC | PRN
  Filled 2021-12-05: qty 55

## 2021-12-05 MED ORDER — BUPIVACAINE-EPINEPHRINE (PF) 0.25% -1:200000 IJ SOLN
INTRAMUSCULAR | Status: AC
  Filled 2021-12-05: qty 30

## 2021-12-05 MED ORDER — DEXAMETHASONE SODIUM PHOSPHATE 10 MG/ML IJ SOLN: 8.0000 mg | Freq: Once | INTRAMUSCULAR | Status: DC

## 2021-12-05 MED ORDER — LIDOCAINE HCL (PF) 2 % IJ SOLN
INTRAMUSCULAR | Status: AC
  Filled 2021-12-05: qty 5

## 2021-12-05 MED ORDER — ESCITALOPRAM OXALATE 10 MG PO TABS
10.0000 mg | ORAL_TABLET | Freq: Every day | ORAL | Status: DC
  Administered 2021-12-05 – 2021-12-06 (×2): 10 mg via ORAL
  Filled 2021-12-05 (×2): qty 1

## 2021-12-05 MED ORDER — ACETAMINOPHEN 10 MG/ML IV SOLN: 1000.0000 mg | Freq: Once | INTRAVENOUS | Status: DC | PRN

## 2021-12-05 MED ORDER — ASPIRIN 81 MG PO CHEW
81.0000 mg | CHEWABLE_TABLET | Freq: Two times a day (BID) | ORAL | Status: DC
  Administered 2021-12-05 – 2021-12-06 (×2): 81 mg via ORAL
  Filled 2021-12-05 (×2): qty 1

## 2021-12-05 MED ORDER — FENTANYL CITRATE PF 50 MCG/ML IJ SOSY
50.0000 ug | PREFILLED_SYRINGE | INTRAMUSCULAR | Status: DC
  Administered 2021-12-05: 50 ug via INTRAVENOUS
  Filled 2021-12-05: qty 2

## 2021-12-05 MED ORDER — GUAIFENESIN ER 600 MG PO TB12: 1200.0000 mg | ORAL_TABLET | Freq: Every day | ORAL | Status: DC | PRN

## 2021-12-05 MED ORDER — ACETAMINOPHEN 500 MG PO TABS: 1000.0000 mg | ORAL_TABLET | Freq: Once | ORAL | Status: DC | PRN

## 2021-12-05 MED ORDER — HYDROMORPHONE HCL 1 MG/ML IJ SOLN: 0.5000 mg | INTRAMUSCULAR | Status: DC | PRN

## 2021-12-05 MED ORDER — FLUTICASONE FUROATE-VILANTEROL 100-25 MCG/ACT IN AEPB
1.0000 | INHALATION_SPRAY | Freq: Every day | RESPIRATORY_TRACT | Status: DC
  Filled 2021-12-05: qty 28

## 2021-12-05 MED ORDER — BENAZEPRIL HCL 20 MG PO TABS: 10.0000 mg | ORAL_TABLET | Freq: Every day | ORAL | Status: DC

## 2021-12-05 MED ORDER — ACETAMINOPHEN 325 MG PO TABS: 325.0000 mg | ORAL_TABLET | Freq: Four times a day (QID) | ORAL | Status: DC | PRN

## 2021-12-05 MED ORDER — KETOROLAC TROMETHAMINE 30 MG/ML IJ SOLN
INTRAMUSCULAR | Status: AC
  Filled 2021-12-05: qty 1

## 2021-12-05 MED ORDER — CEFAZOLIN SODIUM-DEXTROSE 2-4 GM/100ML-% IV SOLN
2.0000 g | Freq: Four times a day (QID) | INTRAVENOUS | Status: AC
  Administered 2021-12-05 (×2): 2 g via INTRAVENOUS
  Filled 2021-12-05 (×2): qty 100

## 2021-12-05 MED ORDER — BISACODYL 10 MG RE SUPP: 10.0000 mg | Freq: Every day | RECTAL | Status: DC | PRN

## 2021-12-05 MED ORDER — DEXAMETHASONE SODIUM PHOSPHATE 10 MG/ML IJ SOLN
10.0000 mg | Freq: Once | INTRAMUSCULAR | Status: AC
  Administered 2021-12-06: 10 mg via INTRAVENOUS
  Filled 2021-12-05: qty 1

## 2021-12-05 MED ORDER — AMLODIPINE BESYLATE 5 MG PO TABS
5.0000 mg | ORAL_TABLET | Freq: Every day | ORAL | Status: DC
  Filled 2021-12-05: qty 1

## 2021-12-05 MED ORDER — LIDOCAINE 2% (20 MG/ML) 5 ML SYRINGE
INTRAMUSCULAR | Status: DC | PRN
  Administered 2021-12-05: 80 mg via INTRAVENOUS

## 2021-12-05 MED ORDER — FENTANYL CITRATE PF 50 MCG/ML IJ SOSY: 25.0000 ug | PREFILLED_SYRINGE | INTRAMUSCULAR | Status: DC | PRN

## 2021-12-05 MED ORDER — ACETAMINOPHEN 160 MG/5ML PO SOLN: 1000.0000 mg | Freq: Once | ORAL | Status: DC | PRN

## 2021-12-05 MED ORDER — AZELASTINE HCL 0.1 % NA SOLN: 1.0000 | Freq: Two times a day (BID) | NASAL | Status: DC | PRN

## 2021-12-05 MED ORDER — ONDANSETRON HCL 4 MG PO TABS: 4.0000 mg | ORAL_TABLET | Freq: Four times a day (QID) | ORAL | Status: DC | PRN

## 2021-12-05 MED ORDER — METOCLOPRAMIDE HCL 5 MG/ML IJ SOLN: 5.0000 mg | Freq: Three times a day (TID) | INTRAMUSCULAR | Status: DC | PRN

## 2021-12-05 MED ORDER — ONDANSETRON HCL 4 MG/2ML IJ SOLN: 4.0000 mg | Freq: Four times a day (QID) | INTRAMUSCULAR | Status: DC | PRN

## 2021-12-05 MED ORDER — POLYETHYLENE GLYCOL 3350 17 G PO PACK: 17.0000 g | PACK | Freq: Every day | ORAL | Status: DC | PRN

## 2021-12-05 MED ORDER — TOBRAMYCIN SULFATE 1.2 G IJ SOLR
INTRAMUSCULAR | Status: AC
  Filled 2021-12-05: qty 2.4

## 2021-12-05 MED ORDER — DIPHENHYDRAMINE HCL 12.5 MG/5ML PO ELIX: 12.5000 mg | ORAL_SOLUTION | ORAL | Status: DC | PRN

## 2021-12-05 MED ORDER — TAMSULOSIN HCL 0.4 MG PO CAPS
0.4000 mg | ORAL_CAPSULE | Freq: Every day | ORAL | Status: DC
  Administered 2021-12-05: 0.4 mg via ORAL
  Filled 2021-12-05: qty 1

## 2021-12-05 MED ORDER — OXYCODONE HCL 5 MG PO TABS
5.0000 mg | ORAL_TABLET | ORAL | Status: DC | PRN
  Administered 2021-12-05: 10 mg via ORAL
  Administered 2021-12-05: 5 mg via ORAL
  Administered 2021-12-06 (×2): 10 mg via ORAL
  Filled 2021-12-05: qty 1
  Filled 2021-12-05 (×2): qty 2

## 2021-12-05 MED ORDER — ORAL CARE MOUTH RINSE: 15.0000 mL | Freq: Once | OROMUCOSAL | Status: AC

## 2021-12-05 MED ORDER — BUPIVACAINE-EPINEPHRINE (PF) 0.5% -1:200000 IJ SOLN
INTRAMUSCULAR | Status: DC | PRN
  Administered 2021-12-05: 20 mL via PERINEURAL

## 2021-12-05 MED ORDER — KETOROLAC TROMETHAMINE 30 MG/ML IJ SOLN
INTRAMUSCULAR | Status: DC | PRN
  Administered 2021-12-05: 30 mg

## 2021-12-05 MED ORDER — PHENYLEPHRINE HCL-NACL 20-0.9 MG/250ML-% IV SOLN
INTRAVENOUS | Status: DC | PRN
  Administered 2021-12-05: 15 ug/min via INTRAVENOUS

## 2021-12-05 MED ORDER — FLUTICASONE PROPIONATE 50 MCG/ACT NA SUSP
2.0000 | Freq: Every day | NASAL | Status: DC
  Filled 2021-12-05: qty 16

## 2021-12-05 MED ORDER — BUPIVACAINE-EPINEPHRINE 0.25% -1:200000 IJ SOLN
INTRAMUSCULAR | Status: DC | PRN
  Administered 2021-12-05: 30 mL

## 2021-12-05 MED ORDER — CEFAZOLIN SODIUM-DEXTROSE 2-4 GM/100ML-% IV SOLN
2.0000 g | INTRAVENOUS | Status: AC
  Administered 2021-12-05: 2 g via INTRAVENOUS
  Filled 2021-12-05: qty 100

## 2021-12-05 MED ORDER — PROPOFOL 1000 MG/100ML IV EMUL
INTRAVENOUS | Status: AC
  Filled 2021-12-05: qty 100

## 2021-12-05 MED ORDER — PROPOFOL 500 MG/50ML IV EMUL
INTRAVENOUS | Status: AC
  Filled 2021-12-05: qty 50

## 2021-12-05 MED ORDER — ACETAMINOPHEN 10 MG/ML IV SOLN
1000.0000 mg | Freq: Once | INTRAVENOUS | Status: DC | PRN
Start: 2021-12-05 — End: 2021-12-05

## 2021-12-05 MED ORDER — PROPOFOL 10 MG/ML IV BOLUS
INTRAVENOUS | Status: DC | PRN
  Administered 2021-12-05: 20 mg via INTRAVENOUS
  Administered 2021-12-05: 10 mg via INTRAVENOUS
  Administered 2021-12-05: 20 mg via INTRAVENOUS

## 2021-12-05 MED ORDER — FERROUS SULFATE 325 (65 FE) MG PO TABS
325.0000 mg | ORAL_TABLET | Freq: Three times a day (TID) | ORAL | Status: DC
  Administered 2021-12-06: 325 mg via ORAL
  Filled 2021-12-05: qty 1

## 2021-12-05 MED ORDER — DOCUSATE SODIUM 100 MG PO CAPS
100.0000 mg | ORAL_CAPSULE | Freq: Two times a day (BID) | ORAL | Status: DC
  Administered 2021-12-05 – 2021-12-06 (×2): 100 mg via ORAL
  Filled 2021-12-05 (×2): qty 1

## 2021-12-05 MED ORDER — EPHEDRINE 5 MG/ML INJ
INTRAVENOUS | Status: AC
Start: 2021-12-05 — End: ?
  Filled 2021-12-05: qty 5

## 2021-12-05 MED ORDER — CELECOXIB 200 MG PO CAPS
200.0000 mg | ORAL_CAPSULE | Freq: Two times a day (BID) | ORAL | Status: DC
  Administered 2021-12-05 – 2021-12-06 (×2): 200 mg via ORAL
  Filled 2021-12-05 (×2): qty 1

## 2021-12-05 MED ORDER — STERILE WATER FOR IRRIGATION IR SOLN
Status: DC | PRN
  Administered 2021-12-05: 2000 mL

## 2021-12-05 MED ORDER — DEXAMETHASONE SODIUM PHOSPHATE 10 MG/ML IJ SOLN
INTRAMUSCULAR | Status: AC
  Filled 2021-12-05: qty 1

## 2021-12-05 MED ORDER — EPHEDRINE SULFATE-NACL 50-0.9 MG/10ML-% IV SOSY
PREFILLED_SYRINGE | INTRAVENOUS | Status: DC | PRN
  Administered 2021-12-05: 5 mg via INTRAVENOUS

## 2021-12-05 MED ORDER — LORATADINE 10 MG PO TABS
10.0000 mg | ORAL_TABLET | Freq: Every day | ORAL | Status: DC
  Administered 2021-12-05 – 2021-12-06 (×2): 10 mg via ORAL
  Filled 2021-12-05 (×2): qty 1

## 2021-12-05 SURGICAL SUPPLY — 72 items
ADH SKN CLS APL DERMABOND .7 (GAUZE/BANDAGES/DRESSINGS) ×1
ATTUNE PSRP INSR SZ6 10 KNEE (Insert) IMPLANT
ATUNE TIB SLV M/L 53 (Orthopedic Implant) ×1 IMPLANT
AUG FEM SZ6 8 REV DIST STRL LF (Miscellaneous) ×2 IMPLANT
AUG FEM SZ6 8 REV POST STRL LF (Miscellaneous) ×1 IMPLANT
AUG POST FEM KNEE SZ6 8 (Miscellaneous) ×1 IMPLANT
AUGMENT DISTAL FEM SZ6 8 KNEE (Miscellaneous) IMPLANT
AUGMENT POST FEM KNEE SZ6 8 (Miscellaneous) IMPLANT
BAG COUNTER SPONGE SURGICOUNT (BAG) IMPLANT
BAG DECANTER FOR FLEXI CONT (MISCELLANEOUS) IMPLANT
BAG SPEC THK2 15X12 ZIP CLS (MISCELLANEOUS)
BAG SPNG CNTER NS LX DISP (BAG)
BAG ZIPLOCK 12X15 (MISCELLANEOUS) IMPLANT
BASE TIB KNEE REV RP ATUNE SZ6 (Knees) IMPLANT
BLADE SAW SGTL 11.0X1.19X90.0M (BLADE) IMPLANT
BLADE SAW SGTL 13.0X1.19X90.0M (BLADE) ×2 IMPLANT
BLADE SAW SGTL 81X20 HD (BLADE) ×2 IMPLANT
BLADE SURG SZ10 CARB STEEL (BLADE) ×4 IMPLANT
BNDG ELASTIC 6X5.8 VLCR STR LF (GAUZE/BANDAGES/DRESSINGS) ×2 IMPLANT
BRUSH FEMORAL CANAL (MISCELLANEOUS) IMPLANT
BSPLAT TIB 6 CMNT REV ROT PLAT (Knees) ×1 IMPLANT
CEMENT HV SMART SET (Cement) IMPLANT
CEMENT RESTRICTOR DEPUY SZ 4 (Cement) IMPLANT
COMP FEM ATTUNE CRS SZ6 LT (Femur) ×1 IMPLANT
COMPONENT FEM ATN CRS SZ6 LT (Femur) IMPLANT
COVER SURGICAL LIGHT HANDLE (MISCELLANEOUS) ×2 IMPLANT
CUFF TOURN SGL QUICK 34 (TOURNIQUET CUFF) ×1
CUFF TRNQT CYL 34X4.125X (TOURNIQUET CUFF) ×2 IMPLANT
DERMABOND ADVANCED (GAUZE/BANDAGES/DRESSINGS) ×1
DERMABOND ADVANCED .7 DNX12 (GAUZE/BANDAGES/DRESSINGS) ×2 IMPLANT
DRAPE INCISE IOBAN 66X45 STRL (DRAPES) ×2 IMPLANT
DRAPE U-SHAPE 47X51 STRL (DRAPES) ×2 IMPLANT
DRESSING AQUACEL AG SP 3.5X10 (GAUZE/BANDAGES/DRESSINGS) IMPLANT
DRSG AQUACEL AG ADV 3.5X10 (GAUZE/BANDAGES/DRESSINGS) ×2 IMPLANT
DRSG AQUACEL AG SP 3.5X10 (GAUZE/BANDAGES/DRESSINGS)
DURAPREP 26ML APPLICATOR (WOUND CARE) ×4 IMPLANT
ELECT REM PT RETURN 15FT ADLT (MISCELLANEOUS) ×2 IMPLANT
GAUZE SPONGE 2X2 8PLY STRL LF (GAUZE/BANDAGES/DRESSINGS) IMPLANT
GLOVE BIOGEL PI IND STRL 7.5 (GLOVE) ×4 IMPLANT
GLOVE BIOGEL PI IND STRL 8.5 (GLOVE) ×2 IMPLANT
GLOVE ECLIPSE 8.0 STRL XLNG CF (GLOVE) ×4 IMPLANT
GLOVE INDICATOR 6.5 STRL GRN (GLOVE) ×2 IMPLANT
GOWN STRL REUS W/ TWL LRG LVL3 (GOWN DISPOSABLE) ×4 IMPLANT
GOWN STRL REUS W/TWL LRG LVL3 (GOWN DISPOSABLE) ×2
HANDPIECE INTERPULSE COAX TIP (DISPOSABLE) ×1
HOLDER FOLEY CATH W/STRAP (MISCELLANEOUS) IMPLANT
KIT TURNOVER KIT A (KITS) IMPLANT
MANIFOLD NEPTUNE II (INSTRUMENTS) ×2 IMPLANT
NDL SAFETY ECLIP 18X1.5 (MISCELLANEOUS) IMPLANT
NS IRRIG 1000ML POUR BTL (IV SOLUTION) ×2 IMPLANT
PACK TOTAL KNEE CUSTOM (KITS) ×2 IMPLANT
PROTECTOR NERVE ULNAR (MISCELLANEOUS) ×2 IMPLANT
SET HNDPC FAN SPRY TIP SCT (DISPOSABLE) ×2 IMPLANT
SET PAD KNEE POSITIONER (MISCELLANEOUS) ×2 IMPLANT
SLEEVE ATTUNE TIB M/L 53 (Orthopedic Implant) IMPLANT
SOLUTION IRRIG SURGIPHOR (IV SOLUTION) IMPLANT
SPIKE FLUID TRANSFER (MISCELLANEOUS) IMPLANT
STAPLER VISISTAT 35W (STAPLE) IMPLANT
STEM CMT REV 14X130 (Knees) IMPLANT
STEM STR ATTUNE PF 14X60 (Knees) IMPLANT
SUT MNCRL AB 3-0 PS2 18 (SUTURE) ×2 IMPLANT
SUT STRATAFIX PDS+ 0 24IN (SUTURE) ×2 IMPLANT
SUT VIC AB 1 CT1 36 (SUTURE) ×2 IMPLANT
SUT VIC AB 2-0 CT1 27 (SUTURE) ×3
SUT VIC AB 2-0 CT1 TAPERPNT 27 (SUTURE) ×6 IMPLANT
SYR 50ML LL SCALE MARK (SYRINGE) IMPLANT
TOWER CARTRIDGE SMART MIX (DISPOSABLE) ×2 IMPLANT
TRAY FOLEY MTR SLVR 16FR STAT (SET/KITS/TRAYS/PACK) ×2 IMPLANT
TUBE KAMVAC SUCTION (TUBING) IMPLANT
TUBE SUCTION HIGH CAP CLEAR NV (SUCTIONS) ×2 IMPLANT
WATER STERILE IRR 1000ML POUR (IV SOLUTION) ×2 IMPLANT
WRAP KNEE MAXI GEL POST OP (GAUZE/BANDAGES/DRESSINGS) ×2 IMPLANT

## 2021-12-05 NOTE — Transfer of Care (Signed)
Immediate Anesthesia Transfer of Care Note  Patient: [name]  Procedure(s) Performed: Procedure(s): TOTAL KNEE REVISION (Left)  Patient Location: PACU  Anesthesia Type:Spinal  Level of Consciousness:  sedated, patient cooperative and responds to stimulation  Airway & Oxygen Therapy:Patient Spontanous Breathing and Patient connected to face mask oxgen  Post-op Assessment:  Report given to PACU RN and Post -op Vital signs reviewed and stable  Post vital signs:  Reviewed and stable  Last Vitals:  Vitals:   12/05/21 0830 12/05/21 0835  BP: 120/65 109/68  Pulse: 64 67  Resp: (!) 23 20  Temp:    SpO2: 03% 00%    Complications: No apparent anesthesia complications

## 2021-12-05 NOTE — Evaluation (Signed)
Physical Therapy Evaluation Patient Details Name: [name] MRN: [id number] DOB: December 26, 1950 Today's Date: 12/05/2021  History of Present Illness  Pt is a 71yo male presenting s/p L-TKR on 12/05/21. PMH: dysrthythmia, HTN, R-TKA 2011.  Clinical Impression  [name] is a 72 y.o. male POD 0 s/p L-Total Knee Revision, PWB 50%. Patient reports independence with mobility at baseline. Patient is now limited by functional impairments (see PT problem list below) and requires min assist for bed mobility and min guard for transfers, pt able to observe PWB successfully during step pivot transfer to chair, further mobility deferred secondary to anesthesia not fully cleared at hip. Of Note: pt with recent R shoulder injury and history of R impingement syndrome and arthroscopic surgery, pt has reduced ROM and is concerned to protect shoulder during rehabilitation process; care was taken to instruct pt on appropriate compensations while maintaining 50%PWB on LLE, pt verbalized understanding and demonstrated safe technique. Patient instructed in exercise to facilitate ROM and circulation to manage edema. Provided incentive spirometer and with Vcs pt able to achieve 223m. Patient will benefit from continued skilled PT interventions to address impairments and progress towards PLOF. Acute PT will follow to progress mobility and stair training in preparation for safe discharge home.       Recommendations for follow up therapy are one component of a multi-disciplinary discharge planning process, led by the attending physician.  Recommendations may be updated based on patient status, additional functional criteria and insurance authorization.  Follow Up Recommendations Follow physician's recommendations for discharge plan and follow up therapies      Assistance Recommended at Discharge Intermittent Supervision/Assistance  Patient can return home with the following  A little help with walking and/or  transfers;A little help with bathing/dressing/bathroom;Assistance with cooking/housework;Assist for transportation;Help with stairs or ramp for entrance    Equipment Recommendations Rolling walker (2 wheels) (youth sized RW (pt is 5'2"))  Recommendations for Other Services       Functional Status Assessment Patient has had a recent decline in their functional status and demonstrates the ability to make significant improvements in function in a reasonable and predictable amount of time.     Precautions / Restrictions Precautions Precautions: Fall;Knee Precaution Booklet Issued: No Precaution Comments: no pillow under knee Restrictions Weight Bearing Restrictions: Yes LLE Weight Bearing: Partial weight bearing LLE Partial Weight Bearing Percentage or Pounds: 50 Other Position/Activity Restrictions: Educated PWB, pt verbalized understanding      Mobility  Bed Mobility Overal bed mobility: Needs Assistance Bed Mobility: Supine to Sit     Supine to sit: Min assist     General bed mobility comments: MinA to bring LLE off bed    Transfers Overall transfer level: Needs assistance Equipment used: Rolling walker (2 wheels) Transfers: Sit to/from Stand Sit to Stand: Min assist, From elevated surface           General transfer comment: minA for steadying of RW, no physical assist provided, VCs to use LUE and RLE to power up, protecting RUE and RLE. Educated pt on 50%PWB, pt verbalized understanding and was able to observe precautions with cuing.    Ambulation/Gait               General Gait Details: deferred  Stairs            Wheelchair Mobility    Modified Rankin (Stroke Patients Only)       Balance Overall balance assessment: Needs assistance Sitting-balance support: Feet supported, No upper extremity supported  Sitting balance-Leahy Scale: Good     Standing balance support: Reliant on assistive device for balance, During functional activity,  Bilateral upper extremity supported Standing balance-Leahy Scale: Poor                               Pertinent Vitals/Pain Pain Assessment Pain Assessment: No/denies pain    Home Living Family/patient expects to be discharged to:: Private residence Living Arrangements: Alone Available Help at Discharge: Family;Available 24 hours/day (Mom, Sister) Type of Home: House Home Access: Stairs to enter Entrance Stairs-Rails: None (Vertical metal support pt has been using) Technical brewer of Steps: 3   Home Layout: One level Home Equipment: None      Prior Function Prior Level of Function : Independent/Modified Independent;Driving             Mobility Comments: IND, pt enjoy cycling and mixed martial arts ADLs Comments: IND     Hand Dominance        Extremity/Trunk Assessment   Upper Extremity Assessment Upper Extremity Assessment: RUE deficits/detail;LUE deficits/detail RUE Deficits / Details: Pt has recent R-shoulder injury, reports it as "subscularis tear," reduced range of motion, pt able to reach behind head. Pt has concern with re-injury and reduced function during knee rehabilitation process. Strength grossly WFL. RUE Sensation: WNL LUE Deficits / Details: Strength and ROM WFL LUE Sensation: WNL    Lower Extremity Assessment Lower Extremity Assessment: RLE deficits/detail;LLE deficits/detail RLE Deficits / Details: MMT ank DF/PF 5/5 RLE Sensation: WNL LLE Deficits / Details: MMT ank DF/PF 5/5, no extensor lag noted, pt able to perform SLR to ~1" (limited). LLE Sensation: decreased light touch (WNL at L foot, decreased sensation L hip)    Cervical / Trunk Assessment Cervical / Trunk Assessment: Kyphotic  Communication   Communication: No difficulties  Cognition Arousal/Alertness: Awake/alert Behavior During Therapy: WFL for tasks assessed/performed Overall Cognitive Status: Within Functional Limits for tasks assessed                                           General Comments      Exercises Total Joint Exercises Ankle Circles/Pumps: AROM, Both, 20 reps   Assessment/Plan    PT Assessment Patient needs continued PT services  PT Problem List Decreased strength;Decreased range of motion;Decreased activity tolerance;Decreased balance;Decreased mobility;Decreased coordination;Pain       PT Treatment Interventions DME instruction;Gait training;Stair training;Functional mobility training;Therapeutic activities;Therapeutic exercise;Balance training;Neuromuscular re-education;Patient/family education    PT Goals (Current goals can be found in the Care Plan section)  Acute Rehab PT Goals Patient Stated Goal: Get back to cycling PT Goal Formulation: With patient Time For Goal Achievement: 12/12/21 Potential to Achieve Goals: Good    Frequency 7X/week     Co-evaluation               AM-PAC PT "6 Clicks" Mobility  Outcome Measure Help needed turning from your back to your side while in a flat bed without using bedrails?: A Little Help needed moving from lying on your back to sitting on the side of a flat bed without using bedrails?: A Little Help needed moving to and from a bed to a chair (including a wheelchair)?: A Little Help needed standing up from a chair using your arms (e.g., wheelchair or bedside chair)?: A Little Help needed to walk in hospital room?: A  Little Help needed climbing 3-5 steps with a railing? : A Lot 6 Click Score: 17    End of Session Equipment Utilized During Treatment: Gait belt Activity Tolerance: Patient tolerated treatment well;No increased pain Patient left: in chair;with call bell/phone within reach;with chair alarm set;with SCD's reapplied Nurse Communication: Mobility status PT Visit Diagnosis: Pain;Difficulty in walking, not elsewhere classified (R26.2) Pain - Right/Left: Left Pain - part of body: Knee    Time: 9323-5573 PT Time Calculation (min) (ACUTE  ONLY): 35 min   Charges:   PT Evaluation $PT Eval Low Complexity: 1 Low PT Treatments $Therapeutic Activity: 8-22 mins        Coolidge Breeze, PT, DPT [facility] Department Office: [phone] Pager: [phone]  Coolidge Breeze 12/05/2021, 5:29 PM

## 2021-12-05 NOTE — Anesthesia Procedure Notes (Signed)
Anesthesia Regional Block: Adductor canal block   Pre-Anesthetic Checklist: , timeout performed,  Correct Patient, Correct Site, Correct Laterality,  Correct Procedure, Correct Position, site marked,  Risks and benefits discussed,  Surgical consent,  Pre-op evaluation,  At surgeon's request and post-op pain management  Laterality: Left and Lower  Prep: chloraprep       Needles:  Injection technique: Single-shot      Needle Length: 9cm  Needle Gauge: 22     Additional Needles: Arrow StimuQuik ECHO Echogenic Stimulating PNB Needle  Procedures:,,,, ultrasound used (permanent image in chart),,    Narrative:  Start time: 12/05/2021 8:11 AM End time: 12/05/2021 8:17 AM Injection made incrementally with aspirations every 5 mL.  Performed by: Personally  Anesthesiologist: Oleta Mouse, MD

## 2021-12-05 NOTE — Brief Op Note (Signed)
12/05/2021  8:52 AM  PATIENT:  [name]  71 y.o. male  PRE-OPERATIVE DIAGNOSIS:  Left knee aseptic failure  POST-OPERATIVE DIAGNOSIS:  Left knee aseptic failure  PROCEDURE:  Procedure(s): TOTAL KNEE REVISION (Left)  SURGEON:  Surgeon(s) and Role:    Paralee Cancel, MD - Primary  PHYSICIAN ASSISTANT: Costella Hatcher, PA-C  ANESTHESIA:   regional and spinal  EBL: <200 cc  BLOOD ADMINISTERED:none  DRAINS: none   LOCAL MEDICATIONS USED:  MARCAINE     SPECIMEN:  No Specimen  DISPOSITION OF SPECIMEN:  N/A  COUNTS:  YES  TOURNIQUET:  72 min at 225 mmHg  DICTATION: .Other Dictation: Dictation Number [id number]  PLAN OF CARE: Admit to inpatient   PATIENT DISPOSITION:  PACU - hemodynamically stable.   Delay start of Pharmacological VTE agent (>24hrs) due to surgical blood loss or risk of bleeding: no

## 2021-12-05 NOTE — Anesthesia Preprocedure Evaluation (Addendum)
Anesthesia Evaluation  Patient identified by MRN, date of birth, ID band Patient awake    Reviewed: Allergy & Precautions, NPO status , Patient's Chart, lab work & pertinent test results  History of Anesthesia Complications Negative for: history of anesthetic complications  Airway Mallampati: III  TM Distance: >3 FB Neck ROM: Full    Dental  (+) Edentulous Upper, Dental Advisory Given, Missing,    Pulmonary neg shortness of breath, asthma , sleep apnea and Continuous Positive Airway Pressure Ventilation , neg recent URI,    breath sounds clear to auscultation       Cardiovascular hypertension, Pt. on medications (-) angina(-) Past MI and (-) CHF + dysrhythmias  Rhythm:Regular     Neuro/Psych PSYCHIATRIC DISORDERS Depression negative neurological ROS     GI/Hepatic negative GI ROS, Neg liver ROS,   Endo/Other  negative endocrine ROS  Renal/GU negative Renal ROSLab Results      Component                Value               Date                      CREATININE               0.70                11/06/2021                Musculoskeletal  (+) Arthritis ,   Abdominal   Peds  Hematology negative hematology ROS (+) Lab Results      Component                Value               Date                      WBC                      6.3                 11/06/2021                HGB                      14.4                11/06/2021                HCT                      41.5                11/06/2021                MCV                      106.8 (H)           11/06/2021                PLT                      170.0               11/06/2021  Anesthesia Other Findings   Reproductive/Obstetrics                            Anesthesia Physical Anesthesia Plan  ASA: 3  Anesthesia Plan: MAC, Spinal and Regional   Post-op Pain Management: Regional block*   Induction: Intravenous  PONV Risk  Score and Plan: 1 and Propofol infusion and Treatment may vary due to age or medical condition  Airway Management Planned: Nasal Cannula  Additional Equipment: None  Intra-op Plan:   Post-operative Plan:   Informed Consent: I have reviewed the patients History and Physical, chart, labs and discussed the procedure including the risks, benefits and alternatives for the proposed anesthesia with the patient or authorized representative who has indicated his/her understanding and acceptance.     Dental advisory given  Plan Discussed with:   Anesthesia Plan Comments:         Anesthesia Quick Evaluation

## 2021-12-05 NOTE — Interval H&P Note (Signed)
History and Physical Interval Note:  12/05/2021 6:39 AM  [name]  has presented today for surgery, with the diagnosis of Left knee ascepic loosing.  The various methods of treatment have been discussed with the patient and family. After consideration of risks, benefits and other options for treatment, the patient has consented to  Procedure(s): TOTAL KNEE REVISION (Left) as a surgical intervention.  The patient's history has been reviewed, patient examined, no change in status, stable for surgery.  I have reviewed the patient's chart and labs.  Questions were answered to the patient's satisfaction.     Mauri Pole

## 2021-12-05 NOTE — Anesthesia Postprocedure Evaluation (Signed)
Anesthesia Post Note  Patient: [name]  Procedure(s) Performed: TOTAL KNEE REVISION (Left: Knee)     Patient location during evaluation: PACU Anesthesia Type: Regional, MAC and Spinal Level of consciousness: awake and alert Pain management: pain level controlled Vital Signs Assessment: post-procedure vital signs reviewed and stable Respiratory status: spontaneous breathing, nonlabored ventilation and respiratory function stable Cardiovascular status: stable and blood pressure returned to baseline Postop Assessment: no apparent nausea or vomiting Anesthetic complications: no   No notable events documented.  Last Vitals:  Vitals:   12/05/21 1300 12/05/21 1400  BP: 113/70 126/79  Pulse: (!) 56 (!) 57  Resp: 14 16  Temp:    SpO2: 97% 97%    Last Pain:  Vitals:   12/05/21 1400  TempSrc: Oral  PainSc: 0-No pain                 Santana Edell

## 2021-12-05 NOTE — Op Note (Unsigned)
NAME: [name], KNIPPLE MEDICAL RECORD NO: [id number] ACCOUNT NO: [id number] DATE OF BIRTH: March 01, 1951 FACILITY: [facility] LOCATION: [facility] PHYSICIAN: Pietro Cassis. 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  FINDINGS:  Please see dictated operative note for findings of the procedure.  PROCEDURE:  Revision left total knee arthroplasty.  COMPONENTS USED:  DePuy Attune revision knee system with a size 6 femur, 8 mm distal medial and lateral augments as well as an 8 mm posterior medial augment, a 14 x 130 mm cemented stem.  On the tibia side, we used a size 6 tibial Attune revision tray  with a 53 press-fit sleeve and a 14 x 60 mm press-fit stem.  The final component was a 10 mm posterior stabilized insert to match the 6 femur.  No patellar revision carried out based on intraoperative findings.  SURGEON:  Pietro Cassis. Alvan Dame, MD  ASSISTANT:  Costella Hatcher, PA-C.  Note, Ms. [name] was present the entirety of the case from preoperative positioning, perioperative management of the operative extremity, general facilitation of the case and primary wound closure.  ANESTHESIA:  Regional plus spinal.  DRAINS: None.  BLOOD LOSS:  Less than 200 mL  TOURNIQUET:  Up for a total of 72 minutes at 225 mmHg.  INDICATIONS FOR THE PROCEDURE:  The patient is a pleasant 71 year old male with history of a left total knee arthroplasty performed about 10 years ago.  He has been followed in the office with increasing pain.  Radiographs did not reveal obvious concern  for loosening.  However, his workup was confirmed for loosening by bone scan.  His workup for infection was negative with normal labs and a negative preoperative aspirate.  Based on his pain and the radiographic and clinical workup we determined that he  had failed  his total knee arthroplasty due to aseptic loosening.  We reviewed the risks of infection, DVT, component failure, need for future surgeries.  We discussed and reviewed the postoperative course.  He was eager to proceed based on the effect on  his quality of life that his left knee pain was causing.  DESCRIPTION OF PROCEDURE:  The patient was brought to the operative theater.  Once adequate anesthesia, preoperative antibiotics, Ancef administered as well as tranexamic acid and Decadron.  He was positioned supine with a left thigh tourniquet placed.   Left lower extremity was then prepped and draped in sterile fashion.  A timeout was performed identifying the patient, planned procedure, and extremity.  The leg was exsanguinated, tourniquet elevated to 225 mmHg.  His old incision was excised and  extended slightly proximal and distal.  Soft tissue planes created.  A median arthrotomy was then made.  The synovial fluid was noted to be somewhat blood-tinged, but clear otherwise.  There were no signs of inflammation or infection.  Following initial  exposure including medial suprapatellar and lateral synovectomies as well as working around the patella and the patella tendon and quadriceps tendon region we identified the patella appeared to be stable.  I then measured with a caliper and identified  the patella thickness of 23 mm.  I had concerns about removing the patella with the remaining bone stock and elected to leave the patellar button in place.  With this initial exposure including the proximal medial peel the knee was flexed.  We had opened  up the [facility] explant system.  We identified that the components were obviously loose, but used some of these instruments to prepare to remove the components.  Once I had loosened the tibial tray up from the cement interface we worked around the femoral  component.  Debriding synovial tissue.  The femoral component came off by hand.  We then subluxated the tibia  anteriorly and removed the tibial component.  At this point, I removed remaining cement from the distal femur and proximal tibia.  On the tibia  side, we used an extramedullary guide and made a resection underneath the bone cement interface.  I then used an osteotome to remove bone from the proximal tibia.  Once all the cement was removed, we reamed on the femoral side up to 16 mm and elected to  use a 14 mm cemented stem.  On the tibia side, I reamed just to 14 mm.  On the tibia side, I sized size 6 tibial tray would fit best.  We then broached from the starting broach up to 53 mm.  I then based a trial off of the broach.  Once this was done,  we sized the femur to be a size 6.  The distal cutting block was placed over the reamer and the distal cut revisited.  Minimal bone was removed.  I then sized the femur to be a 6.  The 6 rotation block was pinned into place using the C clamp based off  the proximal tibial cut.  The anterior, posterior as well as the chamfer cuts were revisited.  On the posterior aspect of the femur on the medial side we identified significant bone loss related to the osteolytic changes.  I elected to use 8 mm augments  on the distal and posterior aspect of the medial side.  Once the bone was prepared including the box cut we did a trial reduction.  Initially, I only used the 8 mm posterior augment.  When I did a trial reduction with the 6 femur in place, the knee had a  significant hyperextension.  For that reason, we removed the trial component.  We placed 8 mm augments on the distal femur.  Then, with a 10 mm insert we now balanced the knee in extension and in flexion.  The patella tracked through the trochlea  without application of pressure.  Given all these findings, I elected to go to the 130 mm stem based on the 8 mm distal augments. All the final components were opened and the components were then configured on the back table under direct supervision.  A  cement restrictor was  placed in the distal femur.  The canals were irrigated with pulse lavage normal saline solution.  The remainder of the knee was irrigated as well.  Once the tibial component was prepared it was impacted and sat basically at the  level where the broach had sat.  There was no evidence of any complications with placement of this component.  Once the femoral component was made final cement was mixed.  The final components were cemented into the distal femur and the knee brought to  extension with a 10 mm insert.  Once the cement had fully cured, excessive cement was removed throughout the knee and the final 10 mm insert was selected.  It was placed in the tibial tray.  The tourniquet had been let down after 72 minutes.  There was  no significant hemostasis required.  The knee was irrigated with normal  saline solution.  The extensor mechanism was then reapproximated with the knee in 60 degrees of flexion with #1 Vicryl and #1 Stratafix suture.  The remainder of the wound was closed  with 2-0 Vicryl and a running Monocryl stitch.  The wound was cleaned, dried and dressed sterilely using surgical glue and Aquacel dressing.  The patient was brought to the recovery room in stable condition, tolerating the procedure well.   PUS D: 12/05/2021 11:22:37 am T: 12/05/2021 12:33:00 pm  JOB: [id number]/ [id number]

## 2021-12-05 NOTE — Plan of Care (Signed)
  Problem: Activity: Goal: Ability to avoid complications of mobility impairment will improve Outcome: Progressing   Problem: Clinical Measurements: Goal: Postoperative complications will be avoided or minimized Outcome: Progressing   Problem: Pain Management: Goal: Pain level will decrease with appropriate interventions Outcome: Progressing   

## 2021-12-05 NOTE — Plan of Care (Signed)
  Problem: Education: Goal: Knowledge of the prescribed therapeutic regimen will improve Outcome: Progressing   Problem: Pain Management: Goal: Pain level will decrease with appropriate interventions Outcome: Progressing   Problem: Nutrition: Goal: Adequate nutrition will be maintained Outcome: Progressing   

## 2021-12-05 NOTE — Anesthesia Procedure Notes (Signed)
Spinal  Patient location during procedure: OR Start time: 12/05/2021 8:55 AM End time: 12/05/2021 9:11 AM Reason for block: surgical anesthesia Staffing Performed: anesthesiologist  Anesthesiologist: Oleta Mouse, MD Performed by: Oleta Mouse, MD Authorized by: Oleta Mouse, MD   Preanesthetic Checklist Completed: patient identified, IV checked, risks and benefits discussed, surgical consent, monitors and equipment checked, pre-op evaluation and timeout performed Spinal Block Patient position: sitting Prep: DuraPrep Patient monitoring: heart rate, cardiac monitor, continuous pulse ox and blood pressure Approach: midline Location: L5-S1 Injection technique: single-shot Needle Needle type: Pencan  Needle gauge: 24 G Needle length: 9 cm Assessment Sensory level: T6 Events: CSF return and second provider Additional Notes Difficult spinal with loss of lumbar spine height noted on palpation. Attempts at levels L2/3 and L3/4 unsuccessful after taking over. Single attempt at L5/S1 without difficulty.

## 2021-12-05 NOTE — Discharge Instructions (Signed)

## 2021-12-06 ENCOUNTER — Encounter (HOSPITAL_COMMUNITY): Payer: Self-pay | Admitting: Orthopedic Surgery

## 2021-12-06 LAB — CBC
HCT: 31.8 % — ABNORMAL LOW (ref 39.0–52.0)
Hemoglobin: 11.5 g/dL — ABNORMAL LOW (ref 13.0–17.0)
MCH: 38.3 pg — ABNORMAL HIGH (ref 26.0–34.0)
MCHC: 36.2 g/dL — ABNORMAL HIGH (ref 30.0–36.0)
MCV: 106 fL — ABNORMAL HIGH (ref 80.0–100.0)
Platelets: 131 10*3/uL — ABNORMAL LOW (ref 150–400)
RBC: 3 MIL/uL — ABNORMAL LOW (ref 4.22–5.81)
RDW: 12.9 % (ref 11.5–15.5)
WBC: 11.7 10*3/uL — ABNORMAL HIGH (ref 4.0–10.5)
nRBC: 0 % (ref 0.0–0.2)

## 2021-12-06 LAB — BASIC METABOLIC PANEL
Anion gap: 5 (ref 5–15)
BUN: 15 mg/dL (ref 8–23)
CO2: 24 mmol/L (ref 22–32)
Calcium: 8.5 mg/dL — ABNORMAL LOW (ref 8.9–10.3)
Chloride: 104 mmol/L (ref 98–111)
Creatinine, Ser: 0.75 mg/dL (ref 0.61–1.24)
GFR, Estimated: >60 mL/min (ref >60)
Glucose, Bld: 162 mg/dL — ABNORMAL HIGH (ref 70–99)
Potassium: 3.9 mmol/L (ref 3.5–5.1)
Sodium: 133 mmol/L — ABNORMAL LOW (ref 135–145)

## 2021-12-06 NOTE — Progress Notes (Signed)
Patient discharged to home w/ family. Given all belongings, instructions, equipment. Verbalized understanding of instructions. Escorted to pov via w/c. 

## 2021-12-06 NOTE — Progress Notes (Signed)
Physical Therapy Treatment Patient Details Name: [name] MRN: [id number] DOB: 22-May-1950 Today's Date: 12/06/2021   History of Present Illness Pt is a 71yo male presenting s/p L-TKR on 12/05/21. PMH: dysrthythmia, HTN, R-TKA 2011.    PT Comments    Pt seen POD1 received OOB in recliner reporting 0/10 pain. Pt required min guard for transfers and ambulation in hallway with RW 126f. Pt required min assist for stair mobility with sister assisting and PT providing supervision and verbal cuing. Provided HEP and pt completed supine portion with moderate verbal cues. Pt has met mobility goals for safe discharge home. Pt and family had many questions which were all answered, all education completed. PT is signing off, should needs change, please re-consult. Thank you for this referral.     Recommendations for follow up therapy are one component of a multi-disciplinary discharge planning process, led by the attending physician.  Recommendations may be updated based on patient status, additional functional criteria and insurance authorization.  Follow Up Recommendations  Follow physician's recommendations for discharge plan and follow up therapies     Assistance Recommended at Discharge Intermittent Supervision/Assistance  Patient can return home with the following A little help with walking and/or transfers;A little help with bathing/dressing/bathroom;Assistance with cooking/housework;Assist for transportation;Help with stairs or ramp for entrance   Equipment Recommendations  Rolling walker (2 wheels) (youth sized RW (pt is 5'2"))    Recommendations for Other Services       Precautions / Restrictions Precautions Precautions: Fall;Knee Precaution Booklet Issued: No Precaution Comments: no pillow under knee Restrictions Weight Bearing Restrictions: Yes LLE Weight Bearing: Partial weight bearing LLE Partial Weight Bearing Percentage or Pounds: 50 Other Position/Activity Restrictions:  Educated PWB, pt verbalized understanding     Mobility  Bed Mobility               General bed mobility comments: Pt OOB at entry and exit    Transfers Overall transfer level: Needs assistance Equipment used: Rolling walker (2 wheels) Transfers: Sit to/from Stand Sit to Stand: Min guard           General transfer comment: Min guard, no physical assist required    Ambulation/Gait Ambulation/Gait assistance: Min guard Gait Distance (Feet): 120 Feet Assistive device: Rolling walker (2 wheels) Gait Pattern/deviations: Step-to pattern Gait velocity: decreased     General Gait Details: Pt ambulated with RW and min guard, no physical assist required or overt LOB noted. Pt able to maintain PWB50% throughout.   Stairs Stairs: Yes Stairs assistance: Min assist Stair Management: No rails, Step to pattern, Backwards, With walker Number of Stairs: 4 General stair comments: Pt educated on safe stair mobility with RW and min assist, pt verbalized understanding. Demonstrated safe technique with min assit for steadying of RW, sister assisting with PT supervision. No overt LOB noted. Pt able to maintain PWB status.   Wheelchair Mobility    Modified Rankin (Stroke Patients Only)       Balance Overall balance assessment: Needs assistance Sitting-balance support: Feet supported, No upper extremity supported Sitting balance-Leahy Scale: Good     Standing balance support: Reliant on assistive device for balance, During functional activity, Bilateral upper extremity supported Standing balance-Leahy Scale: Poor                              Cognition Arousal/Alertness: Awake/alert Behavior During Therapy: WFL for tasks assessed/performed Overall Cognitive Status: Within Functional Limits for tasks assessed  Exercises Total Joint Exercises Ankle Circles/Pumps: AROM, Both, 20 reps Quad Sets: AROM, Left,  10 reps Short Arc Quad: AROM, Left, 10 reps Heel Slides: AAROM, Left, 10 reps Hip ABduction/ADduction: AROM, Left, 10 reps Straight Leg Raises: AAROM, Left, 10 reps    General Comments General comments (skin integrity, edema, etc.): Mom and sister Horris Latino present      Pertinent Vitals/Pain Pain Assessment Pain Assessment: No/denies pain    Home Living                          Prior Function            PT Goals (current goals can now be found in the care plan section) Acute Rehab PT Goals Patient Stated Goal: Get back to cycling PT Goal Formulation: With patient Time For Goal Achievement: 12/12/21 Potential to Achieve Goals: Good Progress towards PT goals: Progressing toward goals    Frequency    7X/week      PT Plan Current plan remains appropriate    Co-evaluation              AM-PAC PT "6 Clicks" Mobility   Outcome Measure  Help needed turning from your back to your side while in a flat bed without using bedrails?: None Help needed moving from lying on your back to sitting on the side of a flat bed without using bedrails?: None Help needed moving to and from a bed to a chair (including a wheelchair)?: A Little Help needed standing up from a chair using your arms (e.g., wheelchair or bedside chair)?: A Little Help needed to walk in hospital room?: A Little Help needed climbing 3-5 steps with a railing? : A Lot 6 Click Score: 19    End of Session Equipment Utilized During Treatment: Gait belt Activity Tolerance: Patient tolerated treatment well;No increased pain Patient left: in chair;with call bell/phone within reach;with chair alarm set Nurse Communication: Mobility status PT Visit Diagnosis: Pain;Difficulty in walking, not elsewhere classified (R26.2) Pain - Right/Left: Left Pain - part of body: Knee     Time: 1117-1224 PT Time Calculation (min) (ACUTE ONLY): 67 min  Charges:  $Gait Training: 23-37 mins $Therapeutic Exercise: 23-37  mins                     Coolidge Breeze, PT, DPT [facility] Department Office: [phone] Pager: [phone]   Coolidge Breeze 12/06/2021, 12:48 PM

## 2021-12-06 NOTE — Progress Notes (Signed)
   Subjective: 1 Day Post-Op Procedure(s) (LRB): TOTAL KNEE REVISION (Left) Patient reports pain as minimal.   Patient seen in rounds with Dr. Alvan Dame. Patient is well, and has had no acute complaints or problems. No acute events overnight. Foley catheter removed. Patient ambulated a few feet with PT. His block does seem to still be in effect.  We will start therapy today.   Objective: Vital signs in last 24 hours: Temp:  [97.5 F (36.4 C)-98.9 F (37.2 C)] 97.5 F (36.4 C) (09/08 0545) Pulse Rate:  [54-74] 65 (09/08 0545) Resp:  [13-23] 17 (09/08 0545) BP: (105-146)/(65-82) 130/82 (09/08 0545) SpO2:  [92 %-100 %] 95 % (09/08 0545)  Intake/Output from previous day:  Intake/Output Summary (Last 24 hours) at 12/06/2021 0737 Last data filed at 12/06/2021 2563 Gross per 24 hour  Intake 2676.31 ml  Output 4650 ml  Net -1973.69 ml     Intake/Output this shift: No intake/output data recorded.  Labs: Recent Labs    12/06/21 0423  HGB 11.5*   Recent Labs    12/06/21 0423  WBC 11.7*  RBC 3.00*  HCT 31.8*  PLT 131*   Recent Labs    12/06/21 0423  NA 133*  K 3.9  CL 104  CO2 24  BUN 15  CREATININE 0.75  GLUCOSE 162*  CALCIUM 8.5*   No results for input(s): "LABPT", "INR" in the last 72 hours.  Exam: General - Patient is Alert and Oriented Extremity - Neurologically intact Sensation intact distally Intact pulses distally Dorsiflexion/Plantar flexion intact Dressing - dressing C/D/I Motor Function - intact, moving foot and toes well on exam.   Past Medical History:  Diagnosis Date   Allergy    Arthritis    knees   Asthma    Depression    Dysrhythmia    Elevated PSA    [place] and bx negative 2011   Hypertension    nl cardiolite in 1998   Pneumonia 2005   right hospitalized    Rhinosinusitis    Sleep apnea    wears CPAP    Assessment/Plan: 1 Day Post-Op Procedure(s) (LRB): TOTAL KNEE REVISION (Left) Principal Problem:   S/P revision of total knee,  left  Estimated body mass index is 37.17 kg/m as calculated from the following:   Height as of this encounter: '5\' 2"'$  (1.575 m).   Weight as of this encounter: 92.2 kg. Advance diet Up with therapy D/C IV fluids    DVT Prophylaxis - Aspirin PWB 50% LLE  Hgb stable at 11.5 this AM.  We will send on 1 week prophylactic antibiotics in the setting of revision surgery.  Plan is to go Home after hospital stay. Plan for discharge today following 1-2 sessions of PT as long as they are meeting their goals. Patient is scheduled for OPPT. Follow up in the office in 2 weeks.   Griffith Citron, PA-C Orthopedic Surgery [phone] 12/06/2021, 7:37 AM

## 2021-12-08 NOTE — Telephone Encounter (Signed)
[place]  your surgery went well. Guessing that your change in activity may have effected  ldl.  I suggest  you check  American Heart Association advice  may want to increase fiber such as  oatmeal ( not instant) in diet .   Can make a vurtual or tele visit to discuss  further  Lab Results  Component Value Date   CHOL 189 11/06/2021   CHOL 156 12/24/2020   CHOL 166 06/30/2019   Lab Results  Component Value Date   HDL 54.60 11/06/2021   HDL 46.20 12/24/2020   HDL 41.90 06/30/2019   Lab Results  Component Value Date   LDLCALC 125 (H) 11/06/2021   LDLCALC 97 12/24/2020   LDLCALC 100 (H) 06/30/2019   Lab Results  Component Value Date   TRIG 47.0 11/06/2021   TRIG 62.0 12/24/2020   TRIG 124.0 06/30/2019   Lab Results  Component Value Date   CHOLHDL 3 11/06/2021   CHOLHDL 3 12/24/2020   CHOLHDL 4 06/30/2019   No results found for: "LDLDIRECT"

## 2021-12-09 ENCOUNTER — Telehealth: Payer: Self-pay | Admitting: *Deleted

## 2021-12-09 DIAGNOSIS — M25562 Pain in left knee: Secondary | ICD-10-CM | POA: Diagnosis not present

## 2021-12-09 NOTE — Telephone Encounter (Signed)
Transition Care Management Follow-up Telephone Call  Date discharged? 12/06/21  How have you been since you were released from the hospital? "Everything went well".  Do you understand why you were in the hospital? yes  Do you understand the discharge instructions? yes  Where were you discharged to? Home   Items Reviewed: Medications reviewed: yes Allergies reviewed: yes Dietary changes reviewed: yes Referrals reviewed: yes   Functional Questionnaire:  Activities of Daily Living (ADLs):   He states they are independent in the following:  all States they require assistance with the following: ambulation - using a walker  Any transportation issues/concerns?: no  Any patient concerns? no  Confirmed importance and date/time of follow-up visits scheduled no Provider Appointment booked with N/A  Confirmed with patient if condition begins to worsen call PCP or go to the ER.  Patient was given the office number and encouraged to call back with question or concerns.  : yes

## 2021-12-12 DIAGNOSIS — M25562 Pain in left knee: Secondary | ICD-10-CM | POA: Diagnosis not present

## 2021-12-16 DIAGNOSIS — M25562 Pain in left knee: Secondary | ICD-10-CM | POA: Diagnosis not present

## 2021-12-19 DIAGNOSIS — M25562 Pain in left knee: Secondary | ICD-10-CM | POA: Diagnosis not present

## 2021-12-19 NOTE — Discharge Summary (Signed)
Patient ID: [name] MRN: [id number] DOB/AGE: 11/25/1950 71 y.o.  Admit date: 12/05/2021 Discharge date: 12/06/2021  Admission Diagnoses:  Failed left total knee arthroplasty   Discharge Diagnoses:  Principal Problem:   S/P revision of total knee, left   Past Medical History:  Diagnosis Date   Allergy    Arthritis    knees   Asthma    Depression    Dysrhythmia    Elevated PSA    [place] and bx negative 2011   Hypertension    nl cardiolite in 1998   Pneumonia 2005   right hospitalized    Rhinosinusitis    Sleep apnea    wears CPAP    Surgeries: Procedure(s): TOTAL KNEE REVISION on 12/05/2021   Consultants:   Discharged Condition: Improved  Hospital Course: [name] is an 71 y.o. male who was admitted 12/05/2021 for operative treatment ofS/P revision of total knee, left. Patient has severe unremitting pain that affects sleep, daily activities, and work/hobbies. After pre-op clearance the patient was taken to the operating room on 12/05/2021 and underwent  Procedure(s): TOTAL KNEE REVISION.    Patient was given perioperative antibiotics:  Anti-infectives (From admission, onward)    Start     Dose/Rate Route Frequency Ordered Stop   12/05/21 1500  ceFAZolin (ANCEF) IVPB 2g/100 mL premix        2 g 200 mL/hr over 30 Minutes Intravenous Every 6 hours 12/05/21 1407 12/05/21 2132   12/05/21 0630  ceFAZolin (ANCEF) IVPB 2g/100 mL premix        2 g 200 mL/hr over 30 Minutes Intravenous On call to O.R. 12/05/21 [id number] 12/05/21 0857        Patient was given sequential compression devices, early ambulation, and chemoprophylaxis to prevent DVT. Patient worked with PT and was meeting their goals regarding safe ambulation and transfers.  Patient benefited maximally from hospital stay and there were no complications.    Recent vital signs: No data found.   Recent laboratory studies: No results for input(s): "WBC", "HGB", "HCT", "PLT", "NA", "K", "CL", "CO2", "BUN",  "CREATININE", "GLUCOSE", "INR", "CALCIUM" in the last 72 hours.  Invalid input(s): "PT", "2"   Discharge Medications:   Allergies as of 12/06/2021       Reactions   Eggs Or Egg-derived Products    Nausea and diarrhea   Tape    Causes bleeding and redness        Medication List     STOP taking these medications    ibuprofen 800 MG tablet Commonly known as: ADVIL       TAKE these medications    amLODipine-benazepril 5-10 MG capsule Commonly known as: LOTREL TAKE 1 CAPSULE BY MOUTH EVERY DAY   azelastine 0.1 % nasal spray Commonly known as: ASTELIN Place 1-2 sprays into both nostrils 2 (two) times daily as needed for rhinitis. Use in each nostril as directed   b complex vitamins capsule Take 1 capsule by mouth daily.   B-12 1000 MCG Subl Place 1,000 mcg under the tongue daily.   clobetasol 0.05 % topical foam Commonly known as: OLUX Apply 1 application  topically daily as needed (scalp itching).   Combivent Respimat 20-100 MCG/ACT Aers respimat Generic drug: Ipratropium-Albuterol INHALE 2 PUFFS UP TO FOUR TIMES A DAY AS NEEDED   escitalopram 10 MG tablet Commonly known as: LEXAPRO TAKE 1 TABLET BY MOUTH EVERY DAY   fexofenadine 180 MG tablet Commonly known as: ALLEGRA Take 180 mg by mouth daily.  fluticasone 50 MCG/ACT nasal spray Commonly known as: FLONASE SPRAY 2 SPRAYS INTO EACH NOSTRIL EVERY DAY   Mucinex 600 MG 12 hr tablet Generic drug: guaiFENesin Take 1,200 mg by mouth daily as needed for cough or to loosen phlegm.   silodosin 8 MG Caps capsule Commonly known as: Rapaflo Take 1 capsule (8 mg total) by mouth at bedtime.   triamcinolone cream 0.1 % Commonly known as: KENALOG Apply 1 application  topically daily as needed (itching).   Wixela Inhub 100-50 MCG/ACT Aepb Generic drug: fluticasone-salmeterol INHALE 1 PUFF BY MOUTH TWICE A DAY What changed: See the new instructions.               Discharge Care Instructions  (From  admission, onward)           Start     Ordered   12/06/21 0000  Change dressing       Comments: Maintain surgical dressing until follow up in the clinic. If the edges start to pull up, may reinforce with tape. If the dressing is no longer working, may remove and cover with gauze and tape, but must keep the area dry and clean.  Call with any questions or concerns.   12/06/21 0741            Diagnostic Studies: No results found.  Disposition: Discharge disposition: 01-Home or Self Care       Discharge Instructions     Call MD / Call 911   Complete by: As directed    If you experience chest pain or shortness of breath, CALL 911 and be transported to the hospital emergency room.  If you develope a fever above 101 F, pus (white drainage) or increased drainage or redness at the wound, or calf pain, call your surgeon's office.   Change dressing   Complete by: As directed    Maintain surgical dressing until follow up in the clinic. If the edges start to pull up, may reinforce with tape. If the dressing is no longer working, may remove and cover with gauze and tape, but must keep the area dry and clean.  Call with any questions or concerns.   Constipation Prevention   Complete by: As directed    Drink plenty of fluids.  Prune juice may be helpful.  You may use a stool softener, such as Colace (over the counter) 100 mg twice a day.  Use MiraLax (over the counter) for constipation as needed.   Diet - low sodium heart healthy   Complete by: As directed    Increase activity slowly as tolerated   Complete by: As directed    Partial weight bearing with assist device as directed.   Post-operative opioid taper instructions:   Complete by: As directed    POST-OPERATIVE OPIOID TAPER INSTRUCTIONS: It is important to wean off of your opioid medication as soon as possible. If you do not need pain medication after your surgery it is ok to stop day one. Opioids include: Codeine,  Hydrocodone(Norco, Vicodin), Oxycodone(Percocet, oxycontin) and hydromorphone amongst others.  Long term and even short term use of opiods can cause: Increased pain response Dependence Constipation Depression Respiratory depression And more.  Withdrawal symptoms can include Flu like symptoms Nausea, vomiting And more Techniques to manage these symptoms Hydrate well Eat regular healthy meals Stay active Use relaxation techniques(deep breathing, meditating, yoga) Do Not substitute Alcohol to help with tapering If you have been on opioids for less than two weeks and do not have pain  than it is ok to stop all together.  Plan to wean off of opioids This plan should start within one week post op of your joint replacement. Maintain the same interval or time between taking each dose and first decrease the dose.  Cut the total daily intake of opioids by one tablet each day Next start to increase the time between doses. The last dose that should be eliminated is the evening dose.      TED hose   Complete by: As directed    Use stockings (TED hose) for 2 weeks on both leg(s).  You may remove them at night for sleeping.        Follow-up Information     Paralee Cancel, MD. Schedule an appointment as soon as possible for a visit in 2 week(s).   Specialty: Orthopedic Surgery Contact information: [address] [place] [address] [phone]                  Signed: Irving Copas 12/19/2021, 7:15 AM

## 2021-12-24 DIAGNOSIS — M25562 Pain in left knee: Secondary | ICD-10-CM | POA: Diagnosis not present

## 2021-12-26 DIAGNOSIS — M25562 Pain in left knee: Secondary | ICD-10-CM | POA: Diagnosis not present

## 2021-12-30 DIAGNOSIS — M25562 Pain in left knee: Secondary | ICD-10-CM | POA: Diagnosis not present

## 2022-01-01 ENCOUNTER — Encounter: Payer: Self-pay | Admitting: Internal Medicine

## 2022-01-02 DIAGNOSIS — M25562 Pain in left knee: Secondary | ICD-10-CM | POA: Diagnosis not present

## 2022-01-07 DIAGNOSIS — M25562 Pain in left knee: Secondary | ICD-10-CM | POA: Diagnosis not present

## 2022-01-10 ENCOUNTER — Encounter: Payer: Self-pay | Admitting: Internal Medicine

## 2022-01-10 ENCOUNTER — Other Ambulatory Visit: Payer: Self-pay | Admitting: Internal Medicine

## 2022-01-10 DIAGNOSIS — S0240DA Maxillary fracture, left side, initial encounter for closed fracture: Secondary | ICD-10-CM | POA: Diagnosis not present

## 2022-01-10 DIAGNOSIS — S0292XA Unspecified fracture of facial bones, initial encounter for closed fracture: Secondary | ICD-10-CM | POA: Diagnosis not present

## 2022-01-10 DIAGNOSIS — J329 Chronic sinusitis, unspecified: Secondary | ICD-10-CM | POA: Diagnosis not present

## 2022-01-10 DIAGNOSIS — J3489 Other specified disorders of nose and nasal sinuses: Secondary | ICD-10-CM | POA: Diagnosis not present

## 2022-01-10 DIAGNOSIS — S0240CA Maxillary fracture, right side, initial encounter for closed fracture: Secondary | ICD-10-CM | POA: Diagnosis not present

## 2022-01-15 DIAGNOSIS — J310 Chronic rhinitis: Secondary | ICD-10-CM | POA: Diagnosis not present

## 2022-01-15 DIAGNOSIS — G4733 Obstructive sleep apnea (adult) (pediatric): Secondary | ICD-10-CM | POA: Diagnosis not present

## 2022-01-15 DIAGNOSIS — J3489 Other specified disorders of nose and nasal sinuses: Secondary | ICD-10-CM | POA: Diagnosis not present

## 2022-01-15 DIAGNOSIS — J324 Chronic pansinusitis: Secondary | ICD-10-CM | POA: Diagnosis not present

## 2022-01-16 DIAGNOSIS — M25562 Pain in left knee: Secondary | ICD-10-CM | POA: Diagnosis not present

## 2022-01-20 DIAGNOSIS — M25562 Pain in left knee: Secondary | ICD-10-CM | POA: Diagnosis not present

## 2022-01-20 NOTE — Progress Notes (Signed)
Chief Complaint  Patient presents with   Annual Exam    HPI: Patient  [name]  71 y.o. comes in today for Circle visit   Surgery  sept 7   left knee redo.... knee doing better rom . PT yesterday.doing better . Alvan Dame) R Should er a problem ( Supple)but   hopeful   will  recover to be able to sucba next year  . Resp stable asthma Ent checking sinus nasal defomity that may  benefit from surgery.  For his cpap etc  Noted dec hearing when mom evaluated for her cochlear  implant from [facility]  to be seen at [facility] who has a relationship with [employer] No new bleeding still gets  forearm  easy ecchymosis  and erythem but not otherwise  Has flu and rsv vaccine Urology following psa  down  Health Maintenance  Topic Date Due   COVID-19 Vaccine (7 - Moderna risk series) 03/05/2022   Medicare Annual Wellness (AWV)  06/07/2022   COLONOSCOPY (Pts 45-35yr Insurance coverage will need to be confirmed)  06/24/2022   TETANUS/TDAP  09/09/2027   Pneumonia Vaccine 70 Years old  Completed   INFLUENZA VACCINE  Completed   Hepatitis C Screening  Completed   Zoster Vaccines- Shingrix  Completed   HPV VACCINES  Aged Out   Health Maintenance Review LHHof 1  no sugar drinks  activity rehab knee Looking forward getting back to  scuva  ROS:  GEN/ HEENT: No fever, significant weight changes sweats headaches vision problems  CV/ PULM; No chest pain shortness of breath cough, syncope,edema  change in exercise tolerance. GI /GU: No adominal pain, vomiting, change in bowel habits. No blood in the stool. SKIN/HEME: ,no acute skin rashes suspicious lesions or bleeding. No lymphadenopathy, nodules, masses.  NEURO/ PSYCH:  No neurologic signs such as weakness numbness. No depression anxiety. IMM/ Allergy: No unusual infections.  Allergy .   REST of 12 system review negative except as per HPI   Past Medical History:  Diagnosis Date   Allergy    Arthritis    knees   Asthma    Depression     Dysrhythmia    Elevated PSA    u[place] bx negative 2011   Hypertension    nl cardiolite in 1998   Pneumonia 2005   right hospitalized    Rhinosinusitis    Sleep apnea    wears CPAP    Past Surgical History:  Procedure Laterality Date   COLONOSCOPY  03/09/2013   ECTROPION REPAIR Left    2023   FACIAL RECONSTRUCTION SURGERY     NASAL SINUS SURGERY     REPLACEMENT TOTAL KNEE  01/2010   left; Dr OAlvan Dame RIght knee 06-2010   shoulder atrhroscopic surgery  2014   Right   TOTAL KNEE REVISION Left 12/05/2021   Procedure: TOTAL KNEE REVISION;  Surgeon: OParalee Cancel MD;  Location: [facility];  Service: Orthopedics;  Laterality: Left;   VENTRAL HERNIA REPAIR  03/2007   rerepair winter 2010    Family History  Problem Relation Age of Onset   Heart failure Father        MV replacement   Myelodysplastic syndrome Father    Asthma Mother    Polycythemia Brother    Asthma Other    Hypertension Other    Diabetes Maternal Grandmother    Healthy Sister    Healthy Brother    Healthy Brother    Colon cancer Neg Hx  Pancreatic cancer Neg Hx    Stomach cancer Neg Hx    Esophageal cancer Neg Hx    Colon polyps Neg Hx    Rectal cancer Neg Hx     Social History   Socioeconomic History   Marital status: Single    Spouse name: Not on file   Number of children: Not on file   Years of education: Not on file   Highest education level: Bachelor's degree (e.g., BA, AB, BS)  Occupational History   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   Stopped drinking in 1986    Nonosmoker    Single   HHof 1    Social Determinants of Health    Financial Resource Strain: Low Risk  (11/04/2021)   Overall Financial Resource Strain (CARDIA)    Difficulty of Paying Living Expenses: Not very hard  Food Insecurity: No Food Insecurity (12/05/2021)   Hunger Vital Sign    Worried About Running Out of Food in the Last Year: Never true    Ran Out of Food in the Last Year: Never true  Transportation Needs: No Transportation Needs (12/05/2021)   PRAPARE - Hydrologist (Medical): No    Lack of Transportation (Non-Medical): No  Physical Activity: Sufficiently Active (11/04/2021)   Exercise Vital Sign    Days of Exercise per Week: 5 days    Minutes of Exercise per Session: 60 min  Stress: No Stress Concern Present (11/04/2021)   [place]    Feeling of Stress : Not at all  Social Connections: Moderately Isolated (11/04/2021)   Social Connection and Isolation Panel [NHANES]    Frequency of Communication with Friends and Family: More than three times a week    Frequency of Social Gatherings with Friends and Family: Three times a week    Attends Religious Services: Never    Active Member of Clubs or Organizations: Yes    Attends Music therapist: More than 4 times per year    Marital Status: Divorced    Outpatient Medications Prior to Visit  Medication Sig Dispense Refill   amLODipine-benazepril (LOTREL) 5-10 MG capsule TAKE 1 CAPSULE BY MOUTH EVERY DAY 90 capsule 2   Azelastine HCl 137 MCG/SPRAY SOLN PLACE 1-2 SPRAYS INTO BOTH NOSTRILS 2 (TWO) TIMES DAILY AS NEEDED FOR RHINITIS. 137 mL 12   b complex vitamins capsule Take 1 capsule by mouth daily.     clobetasol (OLUX) 0.05 % topical foam Apply 1 application  topically daily as needed (scalp itching).     Cyanocobalamin (B-12) 1000 MCG SUBL Place 1,000 mcg under the tongue daily.     escitalopram (LEXAPRO) 10 MG tablet TAKE 1 TABLET BY MOUTH EVERY DAY 90 tablet 2   fexofenadine (ALLEGRA)  180 MG tablet Take 180 mg by mouth daily.     fluticasone (FLONASE) 50 MCG/ACT nasal spray SPRAY 2 SPRAYS INTO EACH NOSTRIL EVERY DAY 16 mL 12   Ipratropium-Albuterol (COMBIVENT RESPIMAT) 20-100 MCG/ACT AERS respimat INHALE 2 PUFFS  UP TO FOUR TIMES A DAY AS NEEDED 4 g 11   silodosin (RAPAFLO) 8 MG CAPS capsule Take 1 capsule (8 mg total) by mouth at bedtime. 30 capsule 11   triamcinolone cream (KENALOG) 0.1 % Apply 1 application  topically daily as needed (itching).     WIXELA INHUB 100-50 MCG/ACT AEPB TAKE 1 PUFF BY MOUTH TWICE A DAY 60 each 6   guaiFENesin (MUCINEX) 600 MG 12 hr tablet Take 1,200 mg by mouth daily as needed for cough or to loosen phlegm. (Patient not taking: Reported on 12/09/2021)     No facility-administered medications prior to visit.     EXAM:  BP (!) 150/100 (BP Location: Left Arm, Patient Position: Sitting, Cuff Size: Normal)   Pulse (!) 56   Temp 97.6 F (36.4 C) (Oral)   Ht '5\' 3"'$  (1.6 m)   Wt 205 lb 9.6 oz (93.3 kg)   SpO2 98%   BMI 36.42 kg/m   Body mass index is 36.42 kg/m. Wt Readings from Last 3 Encounters:  01/21/22 205 lb 9.6 oz (93.3 kg)  12/05/21 203 lb 3.2 oz (92.2 kg)  11/22/21 203 lb 3.2 oz (92.2 kg)    Physical Exam: Vital signs reviewed QQP:YPPJ is a well-developed well-nourished alert cooperative    who appearsr stated age in no acute distress.  HEENT: normocephalic atraumatic , Eyes: PERRL EOM's full, conjunctiva clear, Nares: paten,t no deviated discharge or tenderness., Ears: no deformity EAC's clear TMs with normal landmarks. Mouth: clear OP, no lesions, edema.  Moist mucous membranes. NECK: supple without masses, thyromegaly or bruits. CHEST/PULM:  Clear to auscultation and percussion breath sounds equal no wheeze , rales or rhonchi. No chest wall deformities or tenderness. CV: PMI is nondisplaced, S1 S2 no gallops, murmurs, rubs. Peripheral pulses are full without delay.No JVD .  ABDOMEN: Bowel sounds normal nontender  No guard or  rebound, no hepato splenomegal no CVA tenderness.  Extremtities:  No clubbing cyanosis or edema, no acute joint swelling or redness no focal atrophy NEURO:  Oriented x3, cranial nerves 3-12 appear to be intact, no obvious focal weakness,gait within normal limits no abnormal reflexes or asymmetrical SKIN: No acute rashes normal turgor, color, no bruising or petechiae.  Forearms  senil ecchymosis  PSYCH: Oriented, good eye contact, no obvious depression anxiety, cognition and judgment appear normal. LN: no cervical axillary inguinal adenopathy  Lab Results  Component Value Date   WBC 11.7 (H) 12/06/2021   HGB 11.5 (L) 12/06/2021   HCT 31.8 (L) 12/06/2021   PLT 131 (L) 12/06/2021   GLUCOSE 162 (H) 12/06/2021   CHOL 189 11/06/2021   TRIG 47.0 11/06/2021   HDL 54.60 11/06/2021   LDLCALC 125 (H) 11/06/2021   ALT 20 11/06/2021   AST 18 11/06/2021   NA 133 (L) 12/06/2021   K 3.9 12/06/2021   CL 104 12/06/2021   CREATININE 0.75 12/06/2021   BUN 15 12/06/2021   CO2 24 12/06/2021   TSH 2.77 12/24/2020   PSA 6.08 (H) 06/30/2019   INR 1.0 12/24/2020   HGBA1C 5.5 11/06/2021    BP Readings from Last 3 Encounters:  01/21/22 (!) 150/100  12/06/21 113/72  11/22/21 (!) 136/92    Labplan and fu reviewed with patient   ASSESSMENT AND PLAN:  Discussed the following assessment and plan:    ICD-10-CM   1. Visit for preventive health examination  Z00.00     2. Essential hypertension  I10 CBC with Differential/Platelet    Vitamin B12  Folate    Basic metabolic panel    TSH    Iron, TIBC and Ferritin Panel    3. Medication management  Z79.899 CBC with Differential/Platelet    Vitamin B12    Folate    Basic metabolic panel    TSH    Iron, TIBC and Ferritin Panel    4. Obstructive sleep apnea  G47.33     5. Decreased hearing, unspecified laterality  H91.90    agree with audiology eval    6. Asthma, chronic, unspecified asthma severity, uncomplicated  V29.191    stable    7.  Elevated MCV  R71.8 CBC with Differential/Platelet    Vitamin B12    Folate    Basic metabolic panel    TSH    Iron, TIBC and Ferritin Panel   see previous  heme evaluations for eosinophilia and mcv elevation    8. Anemia, unspecified type  D64.9 CBC with Differential/Platelet    Vitamin B12    Folate    Basic metabolic panel    TSH    Iron, TIBC and Ferritin Panel     Return for lab in 1-2 months . Plan  fu anemia mcv etc following  b12 folate  Patient Care Team: Kinte Trim, Standley Brooking, MD as PCP - General orthopedic Festus Aloe, MD as Attending Physician (Urology) Deneise Lever, MD as Attending Physician (Pulmonary Disease) Patient Instructions  Good to see you today .  Glad the surgery went well.   Plan fasting lab in another 1-2 month to recheck blood count  lipids chemistry   Keep [place] informed about the hearing evaluation.   Repeat bp readings were fine with  large cuff  128/80 range    Mariann Laster K. Cassaundra Rasch M.D.

## 2022-01-21 ENCOUNTER — Encounter: Payer: Self-pay | Admitting: Internal Medicine

## 2022-01-21 ENCOUNTER — Ambulatory Visit (INDEPENDENT_AMBULATORY_CARE_PROVIDER_SITE_OTHER): Payer: Medicare PPO | Admitting: Internal Medicine

## 2022-01-21 VITALS — BP 150/100 | HR 56 | Temp 97.6°F | Ht 63.0 in | Wt 205.6 lb

## 2022-01-21 DIAGNOSIS — G4733 Obstructive sleep apnea (adult) (pediatric): Secondary | ICD-10-CM

## 2022-01-21 DIAGNOSIS — R718 Other abnormality of red blood cells: Secondary | ICD-10-CM | POA: Diagnosis not present

## 2022-01-21 DIAGNOSIS — D649 Anemia, unspecified: Secondary | ICD-10-CM

## 2022-01-21 DIAGNOSIS — I10 Essential (primary) hypertension: Secondary | ICD-10-CM | POA: Diagnosis not present

## 2022-01-21 DIAGNOSIS — H9190 Unspecified hearing loss, unspecified ear: Secondary | ICD-10-CM | POA: Diagnosis not present

## 2022-01-21 DIAGNOSIS — Z79899 Other long term (current) drug therapy: Secondary | ICD-10-CM | POA: Diagnosis not present

## 2022-01-21 DIAGNOSIS — J45909 Unspecified asthma, uncomplicated: Secondary | ICD-10-CM | POA: Diagnosis not present

## 2022-01-21 DIAGNOSIS — Z0000 Encounter for general adult medical examination without abnormal findings: Secondary | ICD-10-CM | POA: Diagnosis not present

## 2022-01-21 NOTE — Patient Instructions (Addendum)
Good to see you today .  Glad the surgery went well.   Plan fasting lab in another 1-2 month to recheck blood count  lipids chemistry   Keep [place] informed about the hearing evaluation.   Repeat bp readings were fine with  large cuff  128/80 range

## 2022-01-22 DIAGNOSIS — Z96652 Presence of left artificial knee joint: Secondary | ICD-10-CM | POA: Diagnosis not present

## 2022-01-22 DIAGNOSIS — Z471 Aftercare following joint replacement surgery: Secondary | ICD-10-CM | POA: Diagnosis not present

## 2022-01-24 DIAGNOSIS — Z96611 Presence of right artificial shoulder joint: Secondary | ICD-10-CM | POA: Diagnosis not present

## 2022-01-24 DIAGNOSIS — M25511 Pain in right shoulder: Secondary | ICD-10-CM | POA: Diagnosis not present

## 2022-01-27 DIAGNOSIS — H0279 Other degenerative disorders of eyelid and periocular area: Secondary | ICD-10-CM | POA: Diagnosis not present

## 2022-01-27 DIAGNOSIS — H02112 Cicatricial ectropion of right lower eyelid: Secondary | ICD-10-CM | POA: Diagnosis not present

## 2022-01-27 DIAGNOSIS — Z09 Encounter for follow-up examination after completed treatment for conditions other than malignant neoplasm: Secondary | ICD-10-CM | POA: Diagnosis not present

## 2022-01-27 DIAGNOSIS — H11822 Conjunctivochalasis, left eye: Secondary | ICD-10-CM | POA: Diagnosis not present

## 2022-01-27 DIAGNOSIS — H02115 Cicatricial ectropion of left lower eyelid: Secondary | ICD-10-CM | POA: Diagnosis not present

## 2022-01-29 DIAGNOSIS — M25562 Pain in left knee: Secondary | ICD-10-CM | POA: Diagnosis not present

## 2022-01-29 DIAGNOSIS — J324 Chronic pansinusitis: Secondary | ICD-10-CM | POA: Diagnosis not present

## 2022-01-29 DIAGNOSIS — J3489 Other specified disorders of nose and nasal sinuses: Secondary | ICD-10-CM | POA: Diagnosis not present

## 2022-01-29 DIAGNOSIS — J342 Deviated nasal septum: Secondary | ICD-10-CM | POA: Diagnosis not present

## 2022-01-29 DIAGNOSIS — G4733 Obstructive sleep apnea (adult) (pediatric): Secondary | ICD-10-CM | POA: Diagnosis not present

## 2022-01-29 DIAGNOSIS — R040 Epistaxis: Secondary | ICD-10-CM | POA: Diagnosis not present

## 2022-01-31 ENCOUNTER — Other Ambulatory Visit: Payer: Self-pay | Admitting: Internal Medicine

## 2022-02-03 DIAGNOSIS — M25562 Pain in left knee: Secondary | ICD-10-CM | POA: Diagnosis not present

## 2022-02-05 DIAGNOSIS — Z822 Family history of deafness and hearing loss: Secondary | ICD-10-CM | POA: Diagnosis not present

## 2022-02-05 DIAGNOSIS — H903 Sensorineural hearing loss, bilateral: Secondary | ICD-10-CM | POA: Diagnosis not present

## 2022-02-05 NOTE — Telephone Encounter (Signed)
Ok to refill x 2  

## 2022-02-17 DIAGNOSIS — R972 Elevated prostate specific antigen [PSA]: Secondary | ICD-10-CM | POA: Diagnosis not present

## 2022-02-24 DIAGNOSIS — R972 Elevated prostate specific antigen [PSA]: Secondary | ICD-10-CM | POA: Diagnosis not present

## 2022-02-24 DIAGNOSIS — N401 Enlarged prostate with lower urinary tract symptoms: Secondary | ICD-10-CM | POA: Diagnosis not present

## 2022-02-24 DIAGNOSIS — R3912 Poor urinary stream: Secondary | ICD-10-CM | POA: Diagnosis not present

## 2022-03-06 NOTE — Patient Instructions (Signed)
SURGICAL WAITING ROOM VISITATION Patients having surgery or a procedure may have no more than 2 support people in the waiting area - these visitors may rotate.   Children under the age of 12 must have an adult with them who is not the patient. If the patient needs to stay at the hospital during part of their recovery, the visitor guidelines for inpatient rooms apply. Pre-op nurse will coordinate an appropriate time for 1 support person to accompany patient in pre-op.  This support person may not rotate.    Please refer to the [facility] website for the visitor guidelines for Inpatients (after your surgery is over and you are in a regular room).       Your procedure is scheduled on: 03-14-22   Report to [facility] Main Entrance    Report to admitting at     [place]  AM   Call this number if you have problems the morning of surgery [phone]   Do not eat food :After Midnight.   After Midnight you may have the following liquids until __0430____ AM DAY OF SURGERY then nothing by mouth  Water Non-Citrus Juices (without pulp, NO RED) Carbonated Beverages Black Coffee (NO MILK/CREAM OR CREAMERS, sugar ok)  Clear Tea (NO MILK/CREAM OR CREAMERS, sugar ok) regular and decaf                             Plain Jell-O (NO RED)                                           Fruit ices (not with fruit pulp, NO RED)                                     Popsicles (NO RED)                                                               Sports drinks like Gatorade (NO RED)                    The day of surgery:  Drink ONE (1) Pre-Surgery Clear Ensure  at  0415  AM the morning of surgery. Drink in one sitting. Do not sip.  This drink was given to you during your hospital  pre-op appointment visit. Nothing else to drink after completing the  Pre-Surgery Clear Ensure or G2.          If you have questions, please contact your surgeon's office.   FOLLOW  ANY ADDITIONAL PRE OP INSTRUCTIONS YOU  RECEIVED FROM YOUR SURGEON'S OFFICE!!!     Oral Hygiene is also important to reduce your risk of infection.                                    Remember - BRUSH YOUR TEETH THE MORNING OF SURGERY WITH YOUR REGULAR TOOTHPASTE  DENTURES WILL BE REMOVED PRIOR TO SURGERY PLEASE DO NOT APPLY "Poly grip" OR ADHESIVES!!!  Take these medicines the morning of surgery with A SIP OF WATER:  inhalers and bring them with you, fexofenadine, lexapro   DO NOT TAKE ANY ORAL DIABETIC MEDICATIONS DAY OF YOUR SURGERY  Bring CPAP mask and tubing day of surgery.                              You may not have any metal on your body including hair pins, jewelry, and body piercing             Do not wear , lotions, powders, perfumes/cologne, or deodorant                Men may shave face and neck.   Do not bring valuables to the hospital. [place].   Contacts, glasses, dentures or bridgework may not be worn into surgery.   Bring small overnight bag day of surgery.   DO NOT [place]. PHARMACY WILL DISPENSE MEDICATIONS LISTED ON YOUR MEDICATION LIST TO YOU DURING YOUR ADMISSION [place]!    Patients discharged on the day of surgery will not be allowed to drive home.  Someone NEEDS to stay with you for the first 24 hours after anesthesia.                Please read over the following fact sheets you were given: IF [place] [phone]   If you received a COVID test during your pre-op visit  it is requested that you wear a mask when out in public, stay away from anyone that may not be feeling well and notify your surgeon if you develop symptoms. If you test positive for Covid or have been in contact with anyone that has tested positive in the last 10 days please notify you surgeon.   [place]- Preparing for Total Shoulder Arthroplasty    Before surgery,  you can play an important role. Because skin is not sterile, your skin needs to be as free of germs as possible. You can reduce the number of germs on your skin by using the following products. Benzoyl Peroxide Gel Reduces the number of germs present on the skin Applied twice a day to shoulder area starting two days before surgery    ==================================================================  Please follow these instructions carefully:  BENZOYL PEROXIDE 5% GEL  Please do not use if you have an allergy to benzoyl peroxide.   If your skin becomes reddened/irritated stop using the benzoyl peroxide.  Starting two days before surgery, apply as follows: Apply benzoyl peroxide in the morning and at night. Apply after taking a shower. If you are not taking a shower clean entire shoulder front, back, and side along with the armpit with a clean wet washcloth.  Place a quarter-sized dollop on your shoulder and rub in thoroughly, making sure to cover the front, back, and side of your shoulder, along with the armpit.   2 days before ____ AM   ____ PM              1 day before ____ AM   ____ PM                         Do this twice a day for two  days.  (Last application is the night before surgery, AFTER using the CHG soap as described below).  Do NOT apply benzoyl peroxide gel on the day of surgery.      [place] - Preparing for Surgery Before surgery, you can play an important role.  Because skin is not sterile, your skin needs to be as free of germs as possible.  You can reduce the number of germs on your skin by washing with CHG (chlorahexidine gluconate) soap before surgery.  CHG is an antiseptic cleaner which kills germs and bonds with the skin to continue killing germs even after washing. Please DO NOT use if you have an allergy to CHG or antibacterial soaps.  If your skin becomes reddened/irritated stop using the CHG and inform your nurse when you arrive at Short Stay. Do not shave  (including legs and underarms) for at least 48 hours prior to the first CHG shower.  You may shave your face/neck. Please follow these instructions carefully:  1.  Shower with CHG Soap the night before surgery and the  morning of Surgery.  2.  If you choose to wash your hair, wash your hair first as usual with your  normal  shampoo.  3.  After you shampoo, rinse your hair and body thoroughly to remove the  shampoo.                           4.  Use CHG as you would any other liquid soap.  You can apply chg directly  to the skin and wash                       Gently with a scrungie or clean washcloth.  5.  Apply the CHG Soap to your body ONLY FROM THE NECK DOWN.   Do not use on face/ open                           Wound or open sores. Avoid contact with eyes, ears mouth and genitals (private parts).                       Wash face,  Genitals (private parts) with your normal soap.             6.  Wash thoroughly, paying special attention to the area where your surgery  will be performed.  7.  Thoroughly rinse your body with warm water from the neck down.  8.  DO NOT shower/wash with your normal soap after using and rinsing off  the CHG Soap.                9.  Pat yourself dry with a clean towel.            10.  Wear clean pajamas.            11.  Place clean sheets on your bed the night of your first shower and do not  sleep with pets. Day of Surgery : Do not apply any lotions/deodorants the morning of surgery.  Please wear clean clothes to the hospital/surgery center.  FAILURE TO FOLLOW THESE INSTRUCTIONS MAY RESULT IN THE CANCELLATION OF YOUR SURGERY PATIENT SIGNATURE_________________________________  NURSE SIGNATURE__________________________________  ________________________________________________________________________  Adam Phenix  An incentive spirometer is a tool that can help keep your lungs clear and active. This tool measures how well  you are filling your lungs with  each breath. Taking long deep breaths may help reverse or decrease the chance of developing breathing (pulmonary) problems (especially infection) following: A long period of time when you are unable to move or be active. BEFORE THE PROCEDURE  If the spirometer includes an indicator to show your best effort, your nurse or respiratory therapist will set it to a desired goal. If possible, sit up straight or lean slightly forward. Try not to slouch. Hold the incentive spirometer in an upright position. INSTRUCTIONS FOR USE  Sit on the edge of your bed if possible, or sit up as far as you can in bed or on a chair. Hold the incentive spirometer in an upright position. Breathe out normally. Place the mouthpiece in your mouth and seal your lips tightly around it. Breathe in slowly and as deeply as possible, raising the piston or the ball toward the top of the column. Hold your breath for 3-5 seconds or for as long as possible. Allow the piston or ball to fall to the bottom of the column. Remove the mouthpiece from your mouth and breathe out normally. Rest for a few seconds and repeat Steps 1 through 7 at least 10 times every 1-2 hours when you are awake. Take your time and take a few normal breaths between deep breaths. The spirometer may include an indicator to show your best effort. Use the indicator as a goal to work toward during each repetition. After each set of 10 deep breaths, practice coughing to be sure your lungs are clear. If you have an incision (the cut made at the time of surgery), support your incision when coughing by placing a pillow or rolled up towels firmly against it. Once you are able to get out of bed, walk around indoors and cough well. You may stop using the incentive spirometer when instructed by your caregiver.  RISKS AND COMPLICATIONS Take your time so you do not get dizzy or light-headed. If you are in pain, you may need to take or ask for pain medication before doing  incentive spirometry. It is harder to take a deep breath if you are having pain. AFTER USE Rest and breathe slowly and easily. It can be helpful to keep track of a log of your progress. Your caregiver can provide you with a simple table to help with this. If you are using the spirometer at home, follow these instructions: [place] IF:  You are having difficultly using the spirometer. You have trouble using the spirometer as often as instructed. Your pain medication is not giving enough relief while using the spirometer. You develop fever of 100.5 F (38.1 C) or higher. SEEK IMMEDIATE MEDICAL CARE IF:  You cough up bloody sputum that had not been present before. You develop fever of 102 F (38.9 C) or greater. You develop worsening pain at or near the incision site. MAKE SURE YOU:  Understand these instructions. Will watch your condition. Will get help right away if you are not doing well or get worse. Document Released: 07/28/2006 Document Revised: 06/09/2011 Document Reviewed: 09/28/2006 [facility] Patient Information 2014 [place], [place].   ________________________________________________________________________

## 2022-03-06 NOTE — Progress Notes (Addendum)
PCP - Shanon Ace, MD LOV 01-21-22 epic Cardiologist - no  PPM/ICD -  Device Orders -  Rep Notified -   Chest x-ray -  EKG - 11-22-21 epic Stress Test -  ECHO -  Cardiac Cath -   Sleep Study -  CPAP - YES  Fasting Blood Sugar -  Checks Blood Sugar _____ times a day  Blood Thinner Instructions: Aspirin Instructions:  ERAS Protcol - PRE-SURGERY Ensure    COVID vaccine -  Activity--Able to exercise without SOB Anesthesia review: OSA , Asthma, HTN  Patient denies shortness of breath, fever, cough and chest pain at PAT appointment   All instructions explained to the patient, with a verbal understanding of the material. Patient agrees to go over the instructions while at home for a better understanding. Patient also instructed to self quarantine after being tested for COVID-19. The opportunity to ask questions was provided.

## 2022-03-07 ENCOUNTER — Other Ambulatory Visit: Payer: Self-pay

## 2022-03-07 ENCOUNTER — Encounter (HOSPITAL_COMMUNITY): Payer: Self-pay

## 2022-03-07 ENCOUNTER — Encounter (HOSPITAL_COMMUNITY)
Admission: RE | Admit: 2022-03-07 | Discharge: 2022-03-07 | Disposition: A | Discharge status: Home / Self Care | Payer: Medicare PPO | Source: Ambulatory Visit | Attending: Orthopedic Surgery | Admitting: Orthopedic Surgery

## 2022-03-07 VITALS — BP 147/74 | HR 66 | Temp 97.6°F | Resp 16 | Ht 63.0 in | Wt 206.0 lb

## 2022-03-07 DIAGNOSIS — Z01818 Encounter for other preprocedural examination: Secondary | ICD-10-CM

## 2022-03-07 DIAGNOSIS — I10 Essential (primary) hypertension: Secondary | ICD-10-CM | POA: Diagnosis not present

## 2022-03-07 DIAGNOSIS — Z01812 Encounter for preprocedural laboratory examination: Secondary | ICD-10-CM | POA: Diagnosis not present

## 2022-03-07 LAB — BASIC METABOLIC PANEL
Anion gap: 9 (ref 5–15)
BUN: 14 mg/dL (ref 8–23)
CO2: 24 mmol/L (ref 22–32)
Calcium: 9 mg/dL (ref 8.9–10.3)
Chloride: 104 mmol/L (ref 98–111)
Creatinine, Ser: 0.65 mg/dL (ref 0.61–1.24)
GFR, Estimated: >60 mL/min (ref >60)
Glucose, Bld: 95 mg/dL (ref 70–99)
Potassium: 4.3 mmol/L (ref 3.5–5.1)
Sodium: 137 mmol/L (ref 135–145)

## 2022-03-07 LAB — CBC
HCT: 38.9 % — ABNORMAL LOW (ref 39.0–52.0)
Hemoglobin: 13.3 g/dL (ref 13.0–17.0)
MCH: 35.5 pg — ABNORMAL HIGH (ref 26.0–34.0)
MCHC: 34.2 g/dL (ref 30.0–36.0)
MCV: 103.7 fL — ABNORMAL HIGH (ref 80.0–100.0)
Platelets: 148 10*3/uL — ABNORMAL LOW (ref 150–400)
RBC: 3.75 MIL/uL — ABNORMAL LOW (ref 4.22–5.81)
RDW: 13.4 % (ref 11.5–15.5)
WBC: 5.8 10*3/uL (ref 4.0–10.5)
nRBC: 0 % (ref 0.0–0.2)

## 2022-03-07 LAB — SURGICAL PCR SCREEN
MRSA, PCR: NEGATIVE
Staphylococcus aureus: POSITIVE — AB

## 2022-03-14 ENCOUNTER — Encounter (HOSPITAL_COMMUNITY): Payer: Self-pay | Admitting: Orthopedic Surgery

## 2022-03-14 ENCOUNTER — Other Ambulatory Visit: Payer: Self-pay

## 2022-03-14 ENCOUNTER — Inpatient Hospital Stay (HOSPITAL_COMMUNITY): Payer: Medicare PPO | Admitting: Certified Registered"

## 2022-03-14 ENCOUNTER — Inpatient Hospital Stay (HOSPITAL_BASED_OUTPATIENT_CLINIC_OR_DEPARTMENT_OTHER): Payer: Medicare PPO | Admitting: Certified Registered"

## 2022-03-14 ENCOUNTER — Encounter (HOSPITAL_COMMUNITY)
Admission: RE | Disposition: A | Discharge status: Home / Self Care | Payer: Self-pay | Source: Ambulatory Visit | Attending: Orthopedic Surgery

## 2022-03-14 ENCOUNTER — Ambulatory Visit (HOSPITAL_COMMUNITY)
Admission: RE | Admit: 2022-03-14 | Discharge: 2022-03-14 | Disposition: A | Discharge status: Home / Self Care | Payer: Medicare PPO | Source: Ambulatory Visit | Attending: Orthopedic Surgery | Admitting: Orthopedic Surgery

## 2022-03-14 DIAGNOSIS — J45909 Unspecified asthma, uncomplicated: Secondary | ICD-10-CM | POA: Insufficient documentation

## 2022-03-14 DIAGNOSIS — G4730 Sleep apnea, unspecified: Secondary | ICD-10-CM | POA: Insufficient documentation

## 2022-03-14 DIAGNOSIS — M75101 Unspecified rotator cuff tear or rupture of right shoulder, not specified as traumatic: Secondary | ICD-10-CM | POA: Insufficient documentation

## 2022-03-14 DIAGNOSIS — T84098A Other mechanical complication of other internal joint prosthesis, initial encounter: Secondary | ICD-10-CM | POA: Insufficient documentation

## 2022-03-14 DIAGNOSIS — I10 Essential (primary) hypertension: Secondary | ICD-10-CM | POA: Insufficient documentation

## 2022-03-14 DIAGNOSIS — M1990 Unspecified osteoarthritis, unspecified site: Secondary | ICD-10-CM | POA: Insufficient documentation

## 2022-03-14 DIAGNOSIS — T8489XA Other specified complication of internal orthopedic prosthetic devices, implants and grafts, initial encounter: Secondary | ICD-10-CM | POA: Diagnosis not present

## 2022-03-14 DIAGNOSIS — F32A Depression, unspecified: Secondary | ICD-10-CM | POA: Diagnosis not present

## 2022-03-14 DIAGNOSIS — W19XXXA Unspecified fall, initial encounter: Secondary | ICD-10-CM | POA: Insufficient documentation

## 2022-03-14 DIAGNOSIS — Z96611 Presence of right artificial shoulder joint: Secondary | ICD-10-CM | POA: Diagnosis not present

## 2022-03-14 DIAGNOSIS — G8918 Other acute postprocedural pain: Secondary | ICD-10-CM | POA: Diagnosis not present

## 2022-03-14 HISTORY — PX: REVERSE SHOULDER ARTHROPLASTY: SHX5054

## 2022-03-14 LAB — TYPE AND SCREEN
ABO/RH(D): O POS
Antibody Screen: NEGATIVE

## 2022-03-14 SURGERY — ARTHROPLASTY, SHOULDER, TOTAL, REVERSE
Anesthesia: General | Site: Shoulder | Laterality: Right

## 2022-03-14 MED ORDER — ONDANSETRON HCL 4 MG/2ML IJ SOLN
INTRAMUSCULAR | Status: AC
  Filled 2022-03-14: qty 2

## 2022-03-14 MED ORDER — CHLORHEXIDINE GLUCONATE 0.12 % MT SOLN
15.0000 mL | Freq: Once | OROMUCOSAL | Status: AC
  Administered 2022-03-14: 15 mL via OROMUCOSAL

## 2022-03-14 MED ORDER — PROPOFOL 10 MG/ML IV BOLUS
INTRAVENOUS | Status: DC | PRN
  Administered 2022-03-14: 140 mg via INTRAVENOUS

## 2022-03-14 MED ORDER — EPHEDRINE 5 MG/ML INJ
INTRAVENOUS | Status: AC
  Filled 2022-03-14: qty 5

## 2022-03-14 MED ORDER — ONDANSETRON HCL 4 MG PO TABS: 4.0000 mg | ORAL_TABLET | Freq: Three times a day (TID) | ORAL | 0 refills | Status: DC | PRN

## 2022-03-14 MED ORDER — LACTATED RINGERS IV SOLN: INTRAVENOUS | Status: DC

## 2022-03-14 MED ORDER — LIDOCAINE 2% (20 MG/ML) 5 ML SYRINGE
INTRAMUSCULAR | Status: DC | PRN
  Administered 2022-03-14: 60 mg via INTRAVENOUS

## 2022-03-14 MED ORDER — AMISULPRIDE (ANTIEMETIC) 5 MG/2ML IV SOLN: 10.0000 mg | Freq: Once | INTRAVENOUS | Status: DC | PRN

## 2022-03-14 MED ORDER — LIDOCAINE HCL (PF) 2 % IJ SOLN
INTRAMUSCULAR | Status: AC
  Filled 2022-03-14: qty 5

## 2022-03-14 MED ORDER — OXYCODONE HCL 5 MG PO TABS: 5.0000 mg | ORAL_TABLET | Freq: Once | ORAL | Status: DC | PRN

## 2022-03-14 MED ORDER — LACTATED RINGERS IV BOLUS: 500.0000 mL | Freq: Once | INTRAVENOUS | Status: DC

## 2022-03-14 MED ORDER — CEFAZOLIN SODIUM-DEXTROSE 2-4 GM/100ML-% IV SOLN
2.0000 g | INTRAVENOUS | Status: AC
  Administered 2022-03-14: 2 g via INTRAVENOUS
  Filled 2022-03-14: qty 100

## 2022-03-14 MED ORDER — OXYCODONE HCL 5 MG/5ML PO SOLN: 5.0000 mg | Freq: Once | ORAL | Status: DC | PRN

## 2022-03-14 MED ORDER — BUPIVACAINE LIPOSOME 1.3 % IJ SUSP
INTRAMUSCULAR | Status: DC | PRN
  Administered 2022-03-14: 10 mL via PERINEURAL

## 2022-03-14 MED ORDER — TRANEXAMIC ACID-NACL 1000-0.7 MG/100ML-% IV SOLN
1000.0000 mg | INTRAVENOUS | Status: AC
  Administered 2022-03-14: 1000 mg via INTRAVENOUS
  Filled 2022-03-14: qty 100

## 2022-03-14 MED ORDER — STERILE WATER FOR IRRIGATION IR SOLN
Status: DC | PRN
  Administered 2022-03-14: 2000 mL

## 2022-03-14 MED ORDER — PHENYLEPHRINE HCL-NACL 20-0.9 MG/250ML-% IV SOLN
INTRAVENOUS | Status: DC | PRN
  Administered 2022-03-14: 50 ug/min via INTRAVENOUS

## 2022-03-14 MED ORDER — CYCLOBENZAPRINE HCL 10 MG PO TABS: 10.0000 mg | ORAL_TABLET | Freq: Three times a day (TID) | ORAL | 1 refills | Status: DC | PRN

## 2022-03-14 MED ORDER — OXYCODONE-ACETAMINOPHEN 5-325 MG PO TABS: 1.0000 | ORAL_TABLET | ORAL | 0 refills | Status: DC | PRN

## 2022-03-14 MED ORDER — PROPOFOL 10 MG/ML IV BOLUS
INTRAVENOUS | Status: AC
  Filled 2022-03-14: qty 20

## 2022-03-14 MED ORDER — FENTANYL CITRATE (PF) 100 MCG/2ML IJ SOLN
INTRAMUSCULAR | Status: AC
  Filled 2022-03-14: qty 2

## 2022-03-14 MED ORDER — PROMETHAZINE HCL 25 MG/ML IJ SOLN: 6.2500 mg | INTRAMUSCULAR | Status: DC | PRN

## 2022-03-14 MED ORDER — ACETAMINOPHEN 325 MG PO TABS: 325.0000 mg | ORAL_TABLET | ORAL | Status: DC | PRN

## 2022-03-14 MED ORDER — MIDAZOLAM HCL 2 MG/2ML IJ SOLN
INTRAMUSCULAR | Status: AC
  Filled 2022-03-14: qty 2

## 2022-03-14 MED ORDER — GLYCOPYRROLATE 0.2 MG/ML IJ SOLN
INTRAMUSCULAR | Status: AC
  Filled 2022-03-14: qty 1

## 2022-03-14 MED ORDER — VANCOMYCIN HCL 1000 MG IV SOLR
INTRAVENOUS | Status: DC | PRN
  Administered 2022-03-14: 1000 mg

## 2022-03-14 MED ORDER — ACETAMINOPHEN 10 MG/ML IV SOLN: 1000.0000 mg | Freq: Once | INTRAVENOUS | Status: DC | PRN

## 2022-03-14 MED ORDER — ORAL CARE MOUTH RINSE: 15.0000 mL | Freq: Once | OROMUCOSAL | Status: AC

## 2022-03-14 MED ORDER — MIDAZOLAM HCL 5 MG/5ML IJ SOLN
INTRAMUSCULAR | Status: DC | PRN
  Administered 2022-03-14 (×2): 1 mg via INTRAVENOUS

## 2022-03-14 MED ORDER — 0.9 % SODIUM CHLORIDE (POUR BTL) OPTIME
TOPICAL | Status: DC | PRN
  Administered 2022-03-14: 1000 mL

## 2022-03-14 MED ORDER — ACETAMINOPHEN 160 MG/5ML PO SOLN: 325.0000 mg | ORAL | Status: DC | PRN

## 2022-03-14 MED ORDER — ROCURONIUM BROMIDE 10 MG/ML (PF) SYRINGE
PREFILLED_SYRINGE | INTRAVENOUS | Status: AC
  Filled 2022-03-14: qty 10

## 2022-03-14 MED ORDER — TRANEXAMIC ACID 1000 MG/10ML IV SOLN: 1000.0000 mg | INTRAVENOUS | Status: DC

## 2022-03-14 MED ORDER — DEXAMETHASONE SODIUM PHOSPHATE 10 MG/ML IJ SOLN
INTRAMUSCULAR | Status: AC
  Filled 2022-03-14: qty 1

## 2022-03-14 MED ORDER — ROCURONIUM BROMIDE 10 MG/ML (PF) SYRINGE
PREFILLED_SYRINGE | INTRAVENOUS | Status: DC | PRN
  Administered 2022-03-14: 10 mg via INTRAVENOUS
  Administered 2022-03-14: 60 mg via INTRAVENOUS
  Administered 2022-03-14 (×2): 10 mg via INTRAVENOUS

## 2022-03-14 MED ORDER — LACTATED RINGERS IV BOLUS: 250.0000 mL | Freq: Once | INTRAVENOUS | Status: DC

## 2022-03-14 MED ORDER — FENTANYL CITRATE (PF) 100 MCG/2ML IJ SOLN
INTRAMUSCULAR | Status: DC | PRN
  Administered 2022-03-14 (×2): 50 ug via INTRAVENOUS

## 2022-03-14 MED ORDER — BUPIVACAINE HCL (PF) 0.5 % IJ SOLN
INTRAMUSCULAR | Status: DC | PRN
  Administered 2022-03-14: 12 mL via PERINEURAL

## 2022-03-14 MED ORDER — PHENYLEPHRINE HCL-NACL 20-0.9 MG/250ML-% IV SOLN
INTRAVENOUS | Status: AC
  Filled 2022-03-14: qty 250

## 2022-03-14 MED ORDER — IBUPROFEN 800 MG PO TABS: 800.0000 mg | ORAL_TABLET | Freq: Three times a day (TID) | ORAL | 2 refills | Status: DC | PRN

## 2022-03-14 MED ORDER — ONDANSETRON HCL 4 MG/2ML IJ SOLN
INTRAMUSCULAR | Status: DC | PRN
  Administered 2022-03-14: 4 mg via INTRAVENOUS

## 2022-03-14 MED ORDER — EPHEDRINE SULFATE-NACL 50-0.9 MG/10ML-% IV SOSY
PREFILLED_SYRINGE | INTRAVENOUS | Status: DC | PRN
  Administered 2022-03-14 (×4): 5 mg via INTRAVENOUS

## 2022-03-14 MED ORDER — FENTANYL CITRATE PF 50 MCG/ML IJ SOSY: 25.0000 ug | PREFILLED_SYRINGE | INTRAMUSCULAR | Status: DC | PRN

## 2022-03-14 MED ORDER — VANCOMYCIN HCL 1000 MG IV SOLR
INTRAVENOUS | Status: AC
  Filled 2022-03-14: qty 20

## 2022-03-14 MED ORDER — DEXAMETHASONE SODIUM PHOSPHATE 10 MG/ML IJ SOLN
INTRAMUSCULAR | Status: DC | PRN
  Administered 2022-03-14: 10 mg via INTRAVENOUS

## 2022-03-14 MED ORDER — GLYCOPYRROLATE 0.2 MG/ML IJ SOLN
INTRAMUSCULAR | Status: DC | PRN
  Administered 2022-03-14: .2 mg via INTRAVENOUS

## 2022-03-14 SURGICAL SUPPLY — 71 items
ADH SKN CLS APL DERMABOND .7 (GAUZE/BANDAGES/DRESSINGS) ×1
AID PSTN UNV HD RSTRNT DISP (MISCELLANEOUS) ×1
BAG COUNTER SPONGE SURGICOUNT (BAG) IMPLANT
BAG SPEC THK2 15X12 ZIP CLS (MISCELLANEOUS) ×1
BAG SPNG CNTER NS LX DISP (BAG) ×1
BAG ZIPLOCK 12X15 (MISCELLANEOUS) ×2 IMPLANT
BASEPLATE MOD 24+4 LAT (Plate) IMPLANT
BLADE SAW SGTL 83.5X18.5 (BLADE) ×2 IMPLANT
BNDG CMPR 5X4 CHSV STRCH STRL (GAUZE/BANDAGES/DRESSINGS) ×1
BNDG COHESIVE 4X5 TAN STRL LF (GAUZE/BANDAGES/DRESSINGS) ×2 IMPLANT
BSPLAT GLND +4X24 MDLR STRL (Plate) ×1 IMPLANT
COOLER ICEMAN CLASSIC (MISCELLANEOUS) ×2 IMPLANT
COVER BACK TABLE 60X90IN (DRAPES) ×2 IMPLANT
COVER SURGICAL LIGHT HANDLE (MISCELLANEOUS) ×2 IMPLANT
CUP SUT UNIV REVERS 39 NEU (Shoulder) IMPLANT
DERMABOND ADVANCED .7 DNX12 (GAUZE/BANDAGES/DRESSINGS) ×2 IMPLANT
DRAPE ORTHO SPLIT 77X108 STRL (DRAPES) ×2
DRAPE SHEET LG 3/4 BI-LAMINATE (DRAPES) ×2 IMPLANT
DRAPE SURG 17X11 SM STRL (DRAPES) ×2 IMPLANT
DRAPE SURG ORHT 6 SPLT 77X108 (DRAPES) ×4 IMPLANT
DRAPE TOP 10253 STERILE (DRAPES) ×2 IMPLANT
DRAPE U-SHAPE 47X51 STRL (DRAPES) ×2 IMPLANT
DRESSING AQUACEL AG SP 3.5X6 (GAUZE/BANDAGES/DRESSINGS) ×2 IMPLANT
DRSG AQUACEL AG ADV 3.5X 6 (GAUZE/BANDAGES/DRESSINGS) IMPLANT
DRSG AQUACEL AG ADV 3.5X10 (GAUZE/BANDAGES/DRESSINGS) IMPLANT
DRSG AQUACEL AG SP 3.5X6 (GAUZE/BANDAGES/DRESSINGS) ×1
DURAPREP 26ML APPLICATOR (WOUND CARE) ×2 IMPLANT
ELECT BLADE TIP CTD 4 INCH (ELECTRODE) ×2 IMPLANT
ELECT PENCIL ROCKER SW 15FT (MISCELLANEOUS) ×2 IMPLANT
ELECT REM PT RETURN 15FT ADLT (MISCELLANEOUS) ×2 IMPLANT
FACESHIELD WRAPAROUND (MASK) ×5 IMPLANT
FACESHIELD WRAPAROUND OR TEAM (MASK) ×10 IMPLANT
FIBERTAPE CERCLAGE TLINK SUT (SUTURE) IMPLANT
GLENOSPHERE 39+4 LAT/24 UNI RV (Joint) IMPLANT
GLOVE BIO SURGEON STRL SZ7.5 (GLOVE) ×2 IMPLANT
GLOVE BIO SURGEON STRL SZ8 (GLOVE) ×2 IMPLANT
GLOVE SS BIOGEL STRL SZ 7 (GLOVE) ×2 IMPLANT
GLOVE SS BIOGEL STRL SZ 7.5 (GLOVE) ×2 IMPLANT
GOWN STRL SURGICAL XL XLNG (GOWN DISPOSABLE) ×4 IMPLANT
INSERT HUMERAL M/39 +3/CNSTRND (Miscellaneous) IMPLANT
KIT BASIN OR (CUSTOM PROCEDURE TRAY) ×2 IMPLANT
KIT TURNOVER KIT A (KITS) IMPLANT
MANIFOLD NEPTUNE II (INSTRUMENTS) ×2 IMPLANT
NDL TAPERED W/ NITINOL LOOP (MISCELLANEOUS) ×2 IMPLANT
NEEDLE TAPERED W/ NITINOL LOOP (MISCELLANEOUS) ×1 IMPLANT
NS IRRIG 1000ML POUR BTL (IV SOLUTION) ×2 IMPLANT
OSTEOTOME THIN 10 3 (MISCELLANEOUS) IMPLANT
PACK SHOULDER (CUSTOM PROCEDURE TRAY) ×2 IMPLANT
PAD ARMBOARD 7.5X6 YLW CONV (MISCELLANEOUS) ×2 IMPLANT
PAD COLD SHLDR WRAP-ON (PAD) ×2 IMPLANT
PIN NITINOL TARGETER 2.8 (PIN) IMPLANT
PIN SET MODULAR GLENOID SYSTEM (PIN) IMPLANT
RESTRAINT HEAD UNIVERSAL NS (MISCELLANEOUS) ×2 IMPLANT
SCREW CENTRAL MODULAR 25 (Screw) IMPLANT
SCREW PERI LOCK 5.5X16 (Screw) IMPLANT
SCREW PERI LOCK 5.5X32 (Screw) IMPLANT
SCREW PERIPHERAL 5.5X20 LOCK (Screw) IMPLANT
SLING ARM FOAM STRAP LRG (SOFTGOODS) IMPLANT
SLING ARM FOAM STRAP MED (SOFTGOODS) IMPLANT
SPACER SHLD UNI REV 39 +15 (Shoulder) IMPLANT
STEM CAP COATED SZ 9 REV 180 (Miscellaneous) IMPLANT
SUT MNCRL AB 3-0 PS2 18 (SUTURE) ×2 IMPLANT
SUT MON AB 2-0 CT1 36 (SUTURE) ×2 IMPLANT
SUT VIC AB 1 CT1 36 (SUTURE) ×2 IMPLANT
SUTURE TAPE 1.3 40 TPR END (SUTURE) ×4 IMPLANT
SUTURETAPE 1.3 40 TPR END (SUTURE) ×2
TOWEL OR 17X26 10 PK STRL BLUE (TOWEL DISPOSABLE) ×2 IMPLANT
TOWEL OR NON WOVEN STRL DISP B (DISPOSABLE) ×2 IMPLANT
TUBE SUCTION HIGH CAP CLEAR NV (SUCTIONS) ×2 IMPLANT
TUBING CONNECTING 10 (TUBING) IMPLANT
WATER STERILE IRR 1000ML POUR (IV SOLUTION) ×4 IMPLANT

## 2022-03-14 NOTE — Discharge Instructions (Signed)
 Kevin M. Supple, M.D., F.A.A.O.S. Orthopaedic Surgery Specializing in Arthroscopic and Reconstructive Surgery of the Shoulder [phone] [address] - [place], [place] [address] - Fax [phone]   POST-OP TOTAL SHOULDER REPLACEMENT INSTRUCTIONS  1. Follow up in the office for your first post-op appointment 10-14 days from the date of your surgery. If you do not already have a scheduled appointment, our office will contact you to schedule.  2. The bandage over your incision is waterproof. You may begin showering with this dressing on. You may leave this dressing on until first follow up appointment within 2 weeks. We prefer you leave this dressing in place until follow up however after 5-7 days if you are having itching or skin irritation and would like to remove it you may do so. Go slow and tug at the borders gently to break the bond the dressing has with the skin. At this point if there is no drainage it is okay to go without a bandage or you may cover it with a light guaze and tape. You can also expect significant bruising around your shoulder that will drift down your arm and into your chest wall. This is very normal and should resolve over several days.   3. Wear your sling/immobilizer at all times except to perform the exercises below or to occasionally let your arm dangle by your side to stretch your elbow. You also need to sleep in your sling immobilizer until instructed otherwise. It is ok to remove your sling if you are sitting in a controlled environment and allow your arm to rest in a position of comfort by your side or on your lap with pillows to give your neck and skin a break from the sling. You may remove it to allow arm to dangle by side to shower. If you are up walking around and when you go to sleep at night you need to wear it.  4. Range of motion to your elbow, wrist, and hand are encouraged 3-5 times daily. Exercise to your hand and fingers helps to reduce  swelling you may experience.   5. Prescriptions for a pain medication and a muscle relaxant are provided for you. It is recommended that if you are experiencing pain that you pain medication alone is not controlling, add the muscle relaxant along with the pain medication which can give additional pain relief. The first 1-2 days is generally the most severe of your pain and then should gradually decrease. As your pain lessens it is recommended that you decrease your use of the pain medications to an "as needed basis'" only and to always comply with the recommended dosages of the pain medications.  6. Pain medications can produce constipation along with their use. If you experience this, the use of an over the counter stool softener or laxative daily is recommended.   7. For additional questions or concerns, please do not hesitate to call the office. If after hours there is an answering service to forward your concerns to the physician on call.  8.Pain control following an exparel block  To help control your post-operative pain you received a nerve block  performed with Exparel which is a long acting anesthetic (numbing agent) which can provide pain relief and sensations of numbness (and relief of pain) in the operative shoulder and arm for up to 3 days. Sometimes it provides mixed relief, meaning you may still have numbness in certain areas of the arm but can still be able to   move  parts of that arm, hand, and fingers. We recommend that your prescribed pain medications  be used as needed. We do not feel it is necessary to "pre medicate" and "stay ahead" of pain.  Taking narcotic pain medications when you are not having any pain can lead to unnecessary and potentially dangerous side effects.    9. Use the ice machine as much as possible in the first 5-7 days from surgery, then you can wean its use to as needed. The ice typically needs to be replaced every 6 hours, instead of ice you can actually freeze  water bottles to put in the cooler and then fill water around them to avoid having to purchase ice. You can have spare water bottles freezing to allow you to rotate them once they have melted. Try to have a thin shirt or light cloth or towel under the ice wrap to protect your skin.   FOR ADDITIONAL INFO ON ICE MACHINE AND INSTRUCTIONS GO TO THE WEBSITE AT  [email or web address]  10.  We recommend that you avoid any dental work or cleaning in the first 3 months following your joint replacement. This is to help minimize the possibility of infection from the bacteria in your mouth that enters your bloodstream during dental work. We also recommend that you take an antibiotic prior to your dental work for the first year after your shoulder replacement to further help reduce that risk. Please simply contact our office for antibiotics to be sent to your pharmacy prior to dental work.  11. Dental Antibiotics:  We recommend waiting at least 3 months for any dental work even cleanings unless there is a IT consultant. We also recommend  prophylactic antibiotics for all dental procdeures  the first year following your joint replacement. In some exceptions we recommend them to be used lifelong. We will provide you with that prescription in follow up office visits, or you can call our office.  Exceptions are as follows:  1. History of prior total joint infection  2. Severely immunocompromised (Organ Transplant, cancer chemotherapy, Rheumatoid biologic meds such as [place])  3. Poorly controlled diabetes (A1C &gt; 8.0, blood glucose over 200)   POST-OP EXERCISES  OK TO ALLOW ARM TO DANGLE AND MOVE ELBOW WRIST AND HAND. NO EXERCISES TO THE SHOULDER YET.

## 2022-03-14 NOTE — Op Note (Signed)
03/14/2022  10:43 AM  PATIENT:   [name]  71 y.o. male  PRE-OPERATIVE DIAGNOSIS:  Failed right shoulder anatomic arthroplasty  POST-OPERATIVE DIAGNOSIS: Same  PROCEDURE: Removal of failed right shoulder anatomic arthroplasty and conversion to reverse shoulder arthroplasty utilizing an Arthrex longstem size 9 revision stem with a neutral metaphysis, +15 spacer, +3 polyethylene insert, 39/+4 glenosphere on a small/+4 baseplate  SURGEON:  Anaka Beazer, Metta Clines M.D.  ASSISTANTS: Jenetta Loges, PA-C  Jenetta Loges, PA-C was utilized as an Environmental consultant throughout this case, essential for help with positioning the patient, positioning extremity, tissue manipulation, implantation of the prosthesis, suture management, wound closure, and intraoperative decision-making.  ANESTHESIA:   General endotracheal and interscalene block with Exparel  EBL: 200 cc  SPECIMEN: Tissue was harvested from the medial capsule, humeral metaphysis, and the glenoid and sent for routine culture and sensitivity  Drains: None   PATIENT DISPOSITION:  PACU - hemodynamically stable.    PLAN OF CARE: Discharge to home after PACU  Brief history:  [name] is a 70 year old gentleman well-known to our practice status post an index right shoulder anatomic arthroplasty that we performed back in 2018.  He initially did very well with the right shoulder.  He had an unfortunate fall recently with now progressive increasing pain and loss of motion.  Clinical exam suggestive of disruption of the rotator cuff.  No obvious evidence to suggest an infectious process.  Due to his ongoing and progressive increasing pain and functional limitations he is brought to the operating room at this time for planned right shoulder revision surgery with removal of the failed anatomic arthroplasty and conversion to a reverse shoulder arthroplasty.  Preoperatively, I counseled the patient regarding treatment options and risks versus benefits  thereof.  Possible surgical complications were all reviewed including potential for bleeding, infection, neurovascular injury, persistent pain, loss of motion, anesthetic complication, failure of the implant, and possible need for additional surgery. They understand and accept and agrees with our planned procedure.   Procedure in detail:  After undergoing routine preop evaluation the patient received prophylactic antibiotics and interscalene block with Exparel was established in the holding area by the anesthesia department.  Subsequently placed spine on the operating table and underwent the smooth induction with general endotracheal anesthesia.  Placed into the beachchair position and appropriately padded and protected.  The right shoulder girdle region was sterilely prepped and draped in standard fashion.  Timeout was called.  An approach to the right shoulder was made through the previous deltopectoral incision total length approximate 10 cm.  Skin flaps were elevated dissection carried deeply and electrocautery was used for hemostasis.  The deltopectoral interval was identified and the cephalic vein was noted to be intact and so we dissected through the interval and took the vein laterally with the deltoid.  Dissection carried proximally and distally giving appropriate exposure and dissection then carried beneath the deltoid.  The conjoined tendon had significantly scarred to the subscapularis and this tissue plane was divided and reflected medially using subperiosteal dissection about the implant as well as the medial humeral cortical region.  We also then dissected laterally in a subperiosteal fashion noting significant deficiency of the rotator cuff anteriorly and superiorly.  We then divided the capsular and soft tissue attachments and a subperiosteal fashion around the humeral metaphysis the vomitus to deliver the humeral head through the wound.  With the humeral head exposed the head was removed.  We  then remove the trunnion of her implant and  used flexible osteotomes to dissect between the bone implant junction of the prosthesis anterior posterior medial and lateral.  I should mention that in addition we took a sample of the capsular tissues medially as well as the metaphyseal soft tissues around the proximal implant and ultimately took samples of the soft tissues from the glenoid from beneath the glenoid prosthesis and these 3 specimens were sent for routine culture and sensitivity.  We work sequentially to free up the humeral stem which was extremely well-fixed and ultimately it was removed leaving behind all of the native bone.  We did however find that there were some unicortical fractures proximally that developed is in the process of removing the prosthesis.  Once the implant was removed we irrigated and curetted the canal and placed a trial implant to protect the humeral metaphyseal bone.  We then exposed the glenoid with appropriate retractors and identified that the glenoid prosthesis had become completely loose and separated from the underlying glenoid bone.  2 of the pegs on the implant had broken off and remained within the glenoid bone and these were carefully dissected free and removed.  We then used rongeurs to remove the remaining soft tissue that developed in the peritubular soft tissues.  At this point a guidepin was then directed into the center of the glenoid through the previous central peg and ultimately did achieve solid bone beneath the previous implant and reamed with our small glenoid reamer down to stable subchondral bone.  There had been some moderate medial erosion of the center of the glenoid but this was well-contained.  Given the degree of erosion we elected to use a +4 glenoid.  Glenoid preparation was completed with the central drill and a 25 mm tap.  Our baseplate was then assembled and inserted with vancomycin powder applied to the threads of the lag screw and excellent  fixation was achieved with a central lag screw.  The peripheral locking screws were all then placed using standard technique achieving excellent fixation.  We then confirmed using a peripheral reamer that we would have appropriate clearance for the glenosphere although I did remove some of the prominent marginal glenoid bone superiorly with rongeurs.  This point the baseplate was copiously irrigated.  A 39/+4 glenosphere was then impacted onto the baseplate and the central locking screw was placed.  We then returned our attention back to the proximal humerus where we did see that there had been some progression of fracture lines through the metaphyseal region.  Given this finding we initially passed a cerclage fiber tape around the metaphysis to hopefully protect the bone but this unfortunately created a collapse of a section of metaphyseal bone posteriorly so this was withdrawn.  We proceeded with preparation of the humeral canal using longstem reaming by hand for size 6 and then size 9.  We then broached with a size 9 at 20 degrees retroversion achieving good purchase and fixation.  A trial reduction was performed and this did show appropriate soft tissue balance.  The trial was then removed.  This point we provisionally placed the final implant after it was assembled and achieved good distal fixation and then again passed a surgery collage fiber tape around the metaphysis to control the posteromedial section of metaphyseal bone and we did pass 1 limb of the fiber cerclage through the eyelet on the medial collar of the implant and this allowed excellent compression and stability.  We then used a second fiber cerclage to control the greater tuberosity  and the posterolateral segment of the metaphysis that had become destabilized.  This again was passed through an eyelid on the lateral aspect of the metaphysis and these were again tightened appropriately which allowed these bone segments to be compressed against the  implant allowing excellent fixation.  These were all terminally tightened and suture limbs were knotted and divided.  We then performed a series of trial reductions and ultimately felt that a +15 spacer gave is the best soft tissue balance.  Implant was cleaned and dried and a +15 spacer was placed and a +3 constrained poly was then impacted.  Our final reduction was then performed which showed good motion good stability and good soft tissue balance.  Given the large anterior soft tissue defect we did repair the anterior soft tissue sleeve consisting of the capsule and subscapularis to the eyelets on the collar of the implant anteriorly.  The wound was then copiously irrigated.  Final hemostasis was obtained.  The balance of the vancomycin powder was then sprayed liberally throughout the deep soft tissue planes.  The deltopectoral interval was reapproximated with a series of figure-of-eight and with Vicryl sutures.  2-0 Monocryl used to close the subcu layer and intracuticular 3-0 Monocryl used to close the skin followed by Dermabond and Aquacel dressing.  The right arm was then placed into a sling and the patient was awakened, extubated, and taken to the recovery room in stable condition.  Marin Shutter MD   Contact # [phone]

## 2022-03-14 NOTE — Transfer of Care (Signed)
Immediate Anesthesia Transfer of Care Note  Patient: [name]  Procedure(s) Performed: REVERSE SHOULDER ARTHROPLASTY WITH REMOVAL OF ANATOMIC ARTHROPLASTY (Right: Shoulder)  Patient Location: PACU  Anesthesia Type:GA combined with regional for post-op pain  Level of Consciousness: drowsy and patient cooperative  Airway & Oxygen Therapy: Patient Spontanous Breathing and Patient connected to face mask oxygen  Post-op Assessment: Report given to RN and Post -op Vital signs reviewed and stable  Post vital signs: Reviewed and stable  Last Vitals:  Vitals Value Taken Time  BP 118/67 03/14/22 1100  Temp 36.7 C 03/14/22 1100  Pulse 76 03/14/22 1105  Resp 18 03/14/22 1105  SpO2 94 % 03/14/22 1105  Vitals shown include unvalidated device data.  Last Pain:  Vitals:   03/14/22 1100  TempSrc:   PainSc: 0-No pain      Patients Stated Pain Goal: 3 (53/74/82 7078)  Complications: No notable events documented.

## 2022-03-14 NOTE — Anesthesia Postprocedure Evaluation (Signed)
Anesthesia Post Note  Patient: [name]  Procedure(s) Performed: REVERSE SHOULDER ARTHROPLASTY WITH REMOVAL OF ANATOMIC ARTHROPLASTY (Right: Shoulder)     Patient location during evaluation: PACU Anesthesia Type: General Level of consciousness: awake and alert Pain management: pain level controlled Vital Signs Assessment: post-procedure vital signs reviewed and stable Respiratory status: spontaneous breathing, nonlabored ventilation, respiratory function stable and patient connected to nasal cannula oxygen Cardiovascular status: blood pressure returned to baseline and stable Postop Assessment: no apparent nausea or vomiting Anesthetic complications: no   No notable events documented.  Last Vitals:  Vitals:   03/14/22 1204 03/14/22 1215  BP: 112/75 (!) 108/53  Pulse: 71 69  Resp:    Temp:    SpO2: 100% 99%    Last Pain:  Vitals:   03/14/22 1204  TempSrc:   PainSc: 0-No pain                 Effie Berkshire

## 2022-03-14 NOTE — H&P (Signed)
[name]    Chief Complaint: Failed right shoulder anatomic arthroplasty HPI: The patient is a 71 y.o. male long-term patient of our practice status post and index right shoulder anatomic arthroplasty performed in December 2018.  He initially did extremely well.  However recently he fell injuring his right shoulder with subsequent disruption of the rotator cuff, particularly subscapularis function.  Examination demonstrates loss of strength with severely restricted mobility and pain.  Given his increasing functional limitations, patient is brought to the operating room at this time for planned conversion of anatomic to a reverse arthroplasty.  Past Medical History:  Diagnosis Date   Allergy    Arthritis    knees   Asthma    Depression    Dysrhythmia    RBBB   Elevated PSA    [place] and bx negative 2011   Hypertension    nl cardiolite in 1998   Pneumonia 2005   right hospitalized    Rhinosinusitis    Sleep apnea    wears CPAP      Past Surgical History:  Procedure Laterality Date   COLONOSCOPY  03/09/2013   ECTROPION REPAIR Left    2023   FACIAL RECONSTRUCTION SURGERY     1992   HERNIA REPAIR     NASAL SINUS SURGERY     REPLACEMENT TOTAL KNEE  01/2010   left; [name]; RIght knee 06-2010   right shoulder arthroplasty     2018   shoulder atrhroscopic surgery  2011   Right   TOTAL KNEE REVISION Left 12/05/2021   Procedure: TOTAL KNEE REVISION;  Surgeon: [name] Cancel, MD;  Location: [facility];  Service: Orthopedics;  Laterality: Left;   VENTRAL HERNIA REPAIR  03/2007   rerepair winter 0814   umbilical per pt    Family History  Problem Relation Age of Onset   Heart failure Father        MV replacement   Myelodysplastic syndrome Father    Asthma Mother    Polycythemia Brother    Asthma Other    Hypertension Other    Diabetes Maternal Grandmother    Healthy Sister    Healthy Brother    Healthy Brother    Colon cancer Neg Hx    Pancreatic cancer Neg Hx    Stomach  cancer Neg Hx    Esophageal cancer Neg Hx    Colon polyps Neg Hx    Rectal cancer Neg Hx     Social History:  reports that he has never smoked. He has never used smokeless tobacco. He reports that he does not drink alcohol and does not use drugs.  BMI: Estimated body mass index is 36.88 kg/m as calculated from the following:   Height as of this encounter: '5\' 3"'$  (1.6 m).   Weight as of this encounter: 94.4 kg.  Lab Results  Component Value Date   ALBUMIN 4.6 11/06/2021   Diabetes: Patient does not have a diagnosis of diabetes. Lab Results  Component Value Date   HGBA1C 5.5 11/06/2021     Smoking Status:   reports that he has never smoked. He has never used smokeless tobacco.     Medications Prior to Admission  Medication Sig Dispense Refill   amLODipine-benazepril (LOTREL) 5-10 MG capsule TAKE 1 CAPSULE BY MOUTH EVERY DAY 90 capsule 2   Azelastine HCl 137 MCG/SPRAY SOLN PLACE 1-2 SPRAYS INTO BOTH NOSTRILS 2 (TWO) TIMES DAILY AS NEEDED FOR RHINITIS. 137 mL 12   b complex vitamins capsule  Take 1 capsule by mouth daily.     clobetasol (OLUX) 0.05 % topical foam Apply 1 application  topically daily as needed (scalp itching).     Cyanocobalamin (B-12) 1000 MCG SUBL Place 1,000 mcg under the tongue daily.     escitalopram (LEXAPRO) 10 MG tablet TAKE 1 TABLET BY MOUTH EVERY DAY 90 tablet 2   fexofenadine (ALLEGRA) 180 MG tablet Take 180 mg by mouth daily.     fluticasone (FLONASE) 50 MCG/ACT nasal spray SPRAY 2 SPRAYS INTO EACH NOSTRIL EVERY DAY 16 mL 12   ibuprofen (ADVIL) 800 MG tablet TAKE 1 TABLET BY MOUTH EVERY 8 HOURS AS NEEDED (Patient taking differently: Take 800 mg by mouth 2 (two) times daily as needed for moderate pain.) 60 tablet 2   Ipratropium-Albuterol (COMBIVENT RESPIMAT) 20-100 MCG/ACT AERS respimat INHALE 2 PUFFS UP TO FOUR TIMES A DAY AS NEEDED 4 g 11   NON FORMULARY Pt uses a cpap nightly     Propylene Glycol-Glycerin (SOOTHE) 0.6-0.6 % SOLN Place 1 drop into  both eyes 2 (two) times daily as needed (dry eyes).     silodosin (RAPAFLO) 8 MG CAPS capsule Take 1 capsule (8 mg total) by mouth at bedtime. 30 capsule 11   triamcinolone cream (KENALOG) 0.1 % Apply 1 application  topically daily as needed (itching).     WIXELA INHUB 100-50 MCG/ACT AEPB TAKE 1 PUFF BY MOUTH TWICE A DAY 60 each 6     Physical Exam: Right shoulder demonstrates painful motion with global weakness is noted at his recent office visits.  Active elevation approximately 70 degrees.  Loss of internal rotator strength with clinical evidence to suggest instability of the shoulder.  Plain radiographs demonstrate that the implant is in overall good position and alignment.  Vitals  Temp:  [97.9 F (36.6 C)] 97.9 F (36.6 C) (12/15 0602) Pulse Rate:  [74] 74 (12/15 0602) Resp:  [15] 15 (12/15 0602) BP: (131)/(93) 131/93 (12/15 0602) SpO2:  [98 %] 98 % (12/15 0602) Weight:  [94.4 kg] 94.4 kg (12/15 0537)  Assessment/Plan  Impression: Failed right shoulder anatomic arthroplasty secondary to catastrophic disruption of the rotator cuff.  Plan of Action: Procedure(s): REVERSE SHOULDER ARTHROPLASTY WITH REMOVAL OF ANATOMIC ARTHROPLASTY  [name] 03/14/2022, 6:25 AM Contact # [phone]

## 2022-03-14 NOTE — Anesthesia Preprocedure Evaluation (Addendum)
Anesthesia Evaluation  Patient identified by MRN, date of birth, ID band Patient awake    Reviewed: Allergy & Precautions, NPO status , Patient's Chart, lab work & pertinent test results  Airway Mallampati: III  TM Distance: >3 FB Neck ROM: Full    Dental  (+) Teeth Intact, Dental Advisory Given   Pulmonary asthma , sleep apnea and Continuous Positive Airway Pressure Ventilation    breath sounds clear to auscultation       Cardiovascular hypertension, + dysrhythmias  Rhythm:Regular Rate:Normal     Neuro/Psych  PSYCHIATRIC DISORDERS  Depression    negative neurological ROS     GI/Hepatic negative GI ROS, Neg liver ROS,,,  Endo/Other  negative endocrine ROS    Renal/GU negative Renal ROS     Musculoskeletal  (+) Arthritis ,    Abdominal   Peds  Hematology negative hematology ROS (+)   Anesthesia Other Findings   Reproductive/Obstetrics                             Anesthesia Physical Anesthesia Plan  ASA: 2  Anesthesia Plan: General   Post-op Pain Management: Regional block*   Induction: Intravenous  PONV Risk Score and Plan: 3 and Ondansetron, Dexamethasone, Midazolam and Treatment may vary due to age or medical condition  Airway Management Planned: Oral ETT  Additional Equipment: None  Intra-op Plan:   Post-operative Plan: Extubation in OR  Informed Consent: I have reviewed the patients History and Physical, chart, labs and discussed the procedure including the risks, benefits and alternatives for the proposed anesthesia with the patient or authorized representative who has indicated his/her understanding and acceptance.     Dental advisory given  Plan Discussed with: CRNA  Anesthesia Plan Comments:        Anesthesia Quick Evaluation

## 2022-03-14 NOTE — Anesthesia Procedure Notes (Signed)
Anesthesia Regional Block: Interscalene brachial plexus block   Pre-Anesthetic Checklist: , timeout performed,  Correct Patient, Correct Site, Correct Laterality,  Correct Procedure, Correct Position, site marked,  Risks and benefits discussed,  Surgical consent,  Pre-op evaluation,  At surgeon's request and post-op pain management  Laterality: Right  Prep: chloraprep       Needles:  Injection technique: Single-shot  Needle Type: Echogenic Stimulator Needle     Needle Length: 9cm  Needle Gauge: 21     Additional Needles:   Procedures:,,,, ultrasound used (permanent image in chart),,    Narrative:  Start time: 03/14/2022 7:10 AM End time: 03/14/2022 7:15 AM Injection made incrementally with aspirations every 5 mL.  Performed by: Personally  Anesthesiologist: Effie Berkshire, MD  Additional Notes: Discussed risks and benefits of the nerve block in detail, including but not limited vascular injury, permanent nerve damage and infection.   Patient tolerated the procedure well. Local anesthetic introduced in an incremental fashion under minimal resistance after negative aspirations. No paresthesias were elicited. After completion of the procedure, no acute issues were identified and patient continued to be monitored by RN.

## 2022-03-14 NOTE — Anesthesia Procedure Notes (Signed)
Procedure Name: Intubation Date/Time: 03/14/2022 7:43 AM  Performed by: Khalif Stender D, CRNAPre-anesthesia Checklist: Patient identified, Emergency Drugs available, Suction available and Patient being monitored Patient Re-evaluated:Patient Re-evaluated prior to induction Oxygen Delivery Method: Circle system utilized Preoxygenation: Pre-oxygenation with 100% oxygen Induction Type: IV induction Ventilation: Mask ventilation without difficulty Laryngoscope Size: Mac and 4 Grade View: Grade I Tube type: Oral Tube size: 7.5 mm Number of attempts: 1 Airway Equipment and Method: Stylet and Oral airway Placement Confirmation: ETT inserted through vocal cords under direct vision, positive ETCO2 and breath sounds checked- equal and bilateral Secured at: 22 cm Tube secured with: Tape Dental Injury: Teeth and Oropharynx as per pre-operative assessment

## 2022-03-18 ENCOUNTER — Encounter (HOSPITAL_COMMUNITY): Payer: Self-pay | Admitting: Orthopedic Surgery

## 2022-03-18 ENCOUNTER — Other Ambulatory Visit: Payer: Medicare PPO

## 2022-03-19 LAB — AEROBIC/ANAEROBIC CULTURE W GRAM STAIN (SURGICAL/DEEP WOUND)
Culture: NO GROWTH
Culture: NO GROWTH
Culture: NO GROWTH
Gram Stain: NONE SEEN
Gram Stain: NONE SEEN
Gram Stain: NONE SEEN

## 2022-03-25 ENCOUNTER — Other Ambulatory Visit (INDEPENDENT_AMBULATORY_CARE_PROVIDER_SITE_OTHER): Payer: Medicare PPO

## 2022-03-25 DIAGNOSIS — R718 Other abnormality of red blood cells: Secondary | ICD-10-CM

## 2022-03-25 DIAGNOSIS — D649 Anemia, unspecified: Secondary | ICD-10-CM | POA: Diagnosis not present

## 2022-03-25 DIAGNOSIS — I10 Essential (primary) hypertension: Secondary | ICD-10-CM | POA: Diagnosis not present

## 2022-03-25 DIAGNOSIS — Z79899 Other long term (current) drug therapy: Secondary | ICD-10-CM | POA: Diagnosis not present

## 2022-03-25 LAB — CBC WITH DIFFERENTIAL/PLATELET
Basophils Absolute: 0.1 10*3/uL (ref 0.0–0.1)
Basophils Relative: 0.9 % (ref 0.0–3.0)
Eosinophils Absolute: 0.8 10*3/uL — ABNORMAL HIGH (ref 0.0–0.7)
Eosinophils Relative: 11.4 % — ABNORMAL HIGH (ref 0.0–5.0)
HCT: 37.4 % — ABNORMAL LOW (ref 39.0–52.0)
Hemoglobin: 12.8 g/dL — ABNORMAL LOW (ref 13.0–17.0)
Lymphocytes Relative: 12.7 % (ref 12.0–46.0)
Lymphs Abs: 0.9 10*3/uL (ref 0.7–4.0)
MCHC: 34.3 g/dL (ref 30.0–36.0)
MCV: 104.4 fl — ABNORMAL HIGH (ref 78.0–100.0)
Monocytes Absolute: 1 10*3/uL (ref 0.1–1.0)
Monocytes Relative: 13 % — ABNORMAL HIGH (ref 3.0–12.0)
Neutro Abs: 4.6 10*3/uL (ref 1.4–7.7)
Neutrophils Relative %: 62 % (ref 43.0–77.0)
Platelets: 215 10*3/uL (ref 150.0–400.0)
RBC: 3.58 Mil/uL — ABNORMAL LOW (ref 4.22–5.81)
RDW: 15.5 % (ref 11.5–15.5)
WBC: 7.4 10*3/uL (ref 4.0–10.5)

## 2022-03-25 LAB — BASIC METABOLIC PANEL
BUN: 14 mg/dL (ref 6–23)
CO2: 25 mEq/L (ref 19–32)
Calcium: 9.2 mg/dL (ref 8.4–10.5)
Chloride: 102 mEq/L (ref 96–112)
Creatinine, Ser: 0.64 mg/dL (ref 0.40–1.50)
GFR: 95.92 mL/min (ref >60.00)
Glucose, Bld: 88 mg/dL (ref 70–99)
Potassium: 4.4 mEq/L (ref 3.5–5.1)
Sodium: 135 mEq/L (ref 135–145)

## 2022-03-25 LAB — TSH: TSH: 2.79 u[IU]/mL (ref 0.35–5.50)

## 2022-03-25 LAB — FOLATE: Folate: >23.8 ng/mL (ref >5.9)

## 2022-03-25 LAB — VITAMIN B12: Vitamin B-12: 1386 pg/mL — ABNORMAL HIGH (ref 211–911)

## 2022-03-26 DIAGNOSIS — Z96611 Presence of right artificial shoulder joint: Secondary | ICD-10-CM | POA: Diagnosis not present

## 2022-03-26 DIAGNOSIS — Z471 Aftercare following joint replacement surgery: Secondary | ICD-10-CM | POA: Diagnosis not present

## 2022-03-26 LAB — IRON,TIBC AND FERRITIN PANEL
%SAT: 25 % (calc) (ref 20–48)
Ferritin: 96 ng/mL (ref 24–380)
Iron: 88 ug/dL (ref 50–180)
TIBC: 346 mcg/dL (calc) (ref 250–425)

## 2022-04-02 DIAGNOSIS — J343 Hypertrophy of nasal turbinates: Secondary | ICD-10-CM | POA: Diagnosis not present

## 2022-04-02 DIAGNOSIS — J342 Deviated nasal septum: Secondary | ICD-10-CM | POA: Diagnosis not present

## 2022-04-02 DIAGNOSIS — J3489 Other specified disorders of nose and nasal sinuses: Secondary | ICD-10-CM | POA: Diagnosis not present

## 2022-04-02 DIAGNOSIS — J320 Chronic maxillary sinusitis: Secondary | ICD-10-CM | POA: Diagnosis not present

## 2022-04-04 ENCOUNTER — Encounter (HOSPITAL_COMMUNITY): Payer: Self-pay | Admitting: Orthopedic Surgery

## 2022-04-08 DIAGNOSIS — H02115 Cicatricial ectropion of left lower eyelid: Secondary | ICD-10-CM | POA: Diagnosis not present

## 2022-04-08 DIAGNOSIS — H11822 Conjunctivochalasis, left eye: Secondary | ICD-10-CM | POA: Diagnosis not present

## 2022-04-08 DIAGNOSIS — H02112 Cicatricial ectropion of right lower eyelid: Secondary | ICD-10-CM | POA: Diagnosis not present

## 2022-04-08 DIAGNOSIS — H0279 Other degenerative disorders of eyelid and periocular area: Secondary | ICD-10-CM | POA: Diagnosis not present

## 2022-04-08 HISTORY — PX: EYE SURGERY: SHX253

## 2022-04-28 DIAGNOSIS — Z96611 Presence of right artificial shoulder joint: Secondary | ICD-10-CM | POA: Diagnosis not present

## 2022-04-28 DIAGNOSIS — Z471 Aftercare following joint replacement surgery: Secondary | ICD-10-CM | POA: Diagnosis not present

## 2022-05-06 ENCOUNTER — Emergency Department (HOSPITAL_COMMUNITY): Payer: Medicare PPO

## 2022-05-06 ENCOUNTER — Emergency Department (HOSPITAL_COMMUNITY)
Admission: EM | Admit: 2022-05-06 | Discharge: 2022-05-06 | Disposition: A | Discharge status: Home / Self Care | Payer: Medicare PPO | Source: Home / Self Care | Attending: Emergency Medicine | Admitting: Emergency Medicine

## 2022-05-06 ENCOUNTER — Encounter (HOSPITAL_COMMUNITY): Payer: Self-pay

## 2022-05-06 ENCOUNTER — Other Ambulatory Visit: Payer: Self-pay

## 2022-05-06 DIAGNOSIS — S43004A Unspecified dislocation of right shoulder joint, initial encounter: Secondary | ICD-10-CM | POA: Insufficient documentation

## 2022-05-06 DIAGNOSIS — I440 Atrioventricular block, first degree: Secondary | ICD-10-CM | POA: Diagnosis present

## 2022-05-06 DIAGNOSIS — I4510 Unspecified right bundle-branch block: Secondary | ICD-10-CM | POA: Diagnosis present

## 2022-05-06 DIAGNOSIS — X58XXXA Exposure to other specified factors, initial encounter: Secondary | ICD-10-CM | POA: Insufficient documentation

## 2022-05-06 DIAGNOSIS — M19011 Primary osteoarthritis, right shoulder: Secondary | ICD-10-CM | POA: Diagnosis not present

## 2022-05-06 DIAGNOSIS — Z96651 Presence of right artificial knee joint: Secondary | ICD-10-CM | POA: Diagnosis present

## 2022-05-06 DIAGNOSIS — Z96611 Presence of right artificial shoulder joint: Secondary | ICD-10-CM | POA: Diagnosis present

## 2022-05-06 DIAGNOSIS — T84028A Dislocation of other internal joint prosthesis, initial encounter: Secondary | ICD-10-CM | POA: Diagnosis not present

## 2022-05-06 DIAGNOSIS — I10 Essential (primary) hypertension: Secondary | ICD-10-CM | POA: Diagnosis present

## 2022-05-06 DIAGNOSIS — G4733 Obstructive sleep apnea (adult) (pediatric): Secondary | ICD-10-CM | POA: Diagnosis present

## 2022-05-06 DIAGNOSIS — T84098A Other mechanical complication of other internal joint prosthesis, initial encounter: Secondary | ICD-10-CM | POA: Diagnosis not present

## 2022-05-06 DIAGNOSIS — J45909 Unspecified asthma, uncomplicated: Secondary | ICD-10-CM | POA: Diagnosis present

## 2022-05-06 DIAGNOSIS — T8489XA Other specified complication of internal orthopedic prosthetic devices, implants and grafts, initial encounter: Secondary | ICD-10-CM | POA: Diagnosis not present

## 2022-05-06 DIAGNOSIS — Z79899 Other long term (current) drug therapy: Secondary | ICD-10-CM | POA: Insufficient documentation

## 2022-05-06 DIAGNOSIS — G8918 Other acute postprocedural pain: Secondary | ICD-10-CM | POA: Diagnosis not present

## 2022-05-06 DIAGNOSIS — Z96652 Presence of left artificial knee joint: Secondary | ICD-10-CM | POA: Insufficient documentation

## 2022-05-06 DIAGNOSIS — Z8249 Family history of ischemic heart disease and other diseases of the circulatory system: Secondary | ICD-10-CM | POA: Diagnosis not present

## 2022-05-06 DIAGNOSIS — Z833 Family history of diabetes mellitus: Secondary | ICD-10-CM | POA: Diagnosis not present

## 2022-05-06 DIAGNOSIS — J4520 Mild intermittent asthma, uncomplicated: Secondary | ICD-10-CM | POA: Insufficient documentation

## 2022-05-06 DIAGNOSIS — Z471 Aftercare following joint replacement surgery: Secondary | ICD-10-CM | POA: Diagnosis not present

## 2022-05-06 DIAGNOSIS — Z825 Family history of asthma and other chronic lower respiratory diseases: Secondary | ICD-10-CM | POA: Diagnosis not present

## 2022-05-06 DIAGNOSIS — S43014A Anterior dislocation of right humerus, initial encounter: Secondary | ICD-10-CM | POA: Diagnosis not present

## 2022-05-06 NOTE — ED Triage Notes (Signed)
Pt reports right shoulder dislocation, sent from pcp.

## 2022-05-06 NOTE — ED Provider Notes (Signed)
[facility] Provider Note  CSN: [id number] Arrival date & time: 05/06/22 1538  Chief Complaint(s) Shoulder Injury and Shoulder Pain  HPI [name] is a 72 y.o. male with past medical history as below, significant for arthritis, asthma, hypertension, OSA on CPAP, right side of her shoulder arthroplasty Dr. Onnie Graham 12/15 who presents to the ED with complaint of right shoulder/upper arm pain.  Reports earlier today he was lifting an object, felt discomfort to the anterior portion of his shoulder, noticed a protuberance to his right upper arm, difficulty move his arm that time.  He contacted orthopedics who advised him to come to the ER for x-ray to rule out dislocation.  Patient reports after he got here while using his x-ray the protuberant spontaneously reducing his pain was immediately improved.  He now has returned to his baseline range of motion to his right upper extremity, no numbness or tingling to his right upper extremity.   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   Peripheral edema 09/17/2017   Eosinophilia 06/11/2016   Thrombocytopenia ([place]) 10/31/2014   Medication management 10/19/2013   Borderline anemia 10/19/2013   DJD (degenerative joint disease) 02/17/2013   Visit for preventive health examination 10/12/2011   Skin lesion of right leg 09/28/2010   Preventative health care 09/24/2010   KNEE REPLACEMENT, LEFT, HX OF 03/21/2010   OBESITY 09/17/2009   BUNDLE BRANCH BLOCK, RIGHT 09/17/2009   OSTEOARTHRITIS, KNEES, 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 Authorizing Provider  amLODipine-benazepril (LOTREL) 5-10 MG capsule TAKE 1 CAPSULE BY MOUTH EVERY DAY 11/08/21   Panosh, Standley Brooking, MD  Azelastine HCl 137 MCG/SPRAY SOLN PLACE 1-2 SPRAYS INTO BOTH NOSTRILS 2 (TWO) TIMES DAILY AS NEEDED FOR RHINITIS. 01/10/22   Baird Lyons D, MD  b complex vitamins capsule Take 1 capsule by mouth daily.    [provider]  clobetasol (OLUX) 0.05 % topical foam Apply 1 application  topically daily as needed (scalp itching).    [provider]  Cyanocobalamin (B-12) 1000 MCG SUBL Place 1,000 mcg under the tongue daily.    [provider]  cyclobenzaprine (FLEXERIL) 10 MG tablet Take 1 tablet (10 mg total) by mouth 3 (three) times daily as needed for muscle spasms. 03/14/22   Shuford, Olivia Mackie, PA-C  escitalopram (LEXAPRO) 10 MG tablet TAKE 1 TABLET BY MOUTH EVERY DAY 11/15/21   Panosh, Standley Brooking, MD  fexofenadine (ALLEGRA) 180 MG tablet Take 180 mg by mouth daily.    [provider]  fluticasone (FLONASE) 50 MCG/ACT  nasal spray SPRAY 2 SPRAYS INTO EACH NOSTRIL EVERY DAY 04/03/21   Baird Lyons D, MD  ibuprofen (ADVIL) 800 MG tablet Take 1 tablet (800 mg total) by mouth every 8 (eight) hours as needed. 03/14/22   Shuford, Olivia Mackie, PA-C  Ipratropium-Albuterol (COMBIVENT RESPIMAT) 20-100 MCG/ACT AERS respimat INHALE 2 PUFFS UP TO FOUR TIMES A DAY AS NEEDED 11/21/21   Deneise Lever, MD  NON FORMULARY Pt uses a cpap nightly    [provider]  ondansetron (ZOFRAN) 4 MG  tablet Take 1 tablet (4 mg total) by mouth every 8 (eight) hours as needed for nausea or vomiting. 03/14/22   Shuford, Olivia Mackie, PA-C  oxyCODONE-acetaminophen (PERCOCET) 5-325 MG tablet Take 1 tablet by mouth every 4 (four) hours as needed (max 6 q). 03/14/22   Shuford, Olivia Mackie, PA-C  Propylene Glycol-Glycerin (SOOTHE) 0.6-0.6 % SOLN Place 1 drop into both eyes 2 (two) times daily as needed (dry eyes).    [provider]  silodosin (RAPAFLO) 8 MG CAPS capsule Take 1 capsule (8 mg total) by mouth at bedtime. 06/15/14   Baird Lyons D, MD  triamcinolone cream (KENALOG) 0.1 % Apply 1 application  topically daily as needed (itching).    [provider]  Grant Ruts INHUB 100-50 MCG/ACT AEPB TAKE 1 PUFF BY MOUTH TWICE A DAY 01/14/22   Panosh, Standley Brooking, MD                                                                                                                                    Past Surgical History Past Surgical History:  Procedure Laterality Date   COLONOSCOPY  03/09/2013   ECTROPION REPAIR Left    2023   FACIAL RECONSTRUCTION SURGERY     1992   HERNIA REPAIR     NASAL SINUS SURGERY     REPLACEMENT TOTAL KNEE  01/2010   left; Dr Alvan Dame; RIght knee 06-2010   REVERSE SHOULDER ARTHROPLASTY Right 03/14/2022   Procedure: REVERSE SHOULDER ARTHROPLASTY WITH REMOVAL OF ANATOMIC ARTHROPLASTY;  Surgeon: [name], MD;  Location: [facility];  Service: Orthopedics;  Laterality: Right;   right shoulder arthroplasty     2018   shoulder atrhroscopic surgery  2011   Right   TOTAL KNEE REVISION Left 12/05/2021   Procedure: TOTAL KNEE REVISION;  Surgeon: Paralee Cancel, MD;  Location: [facility];  Service: Orthopedics;  Laterality: Left;   VENTRAL HERNIA REPAIR  03/2007   rerepair winter [id number]   umbilical per pt   Family History Family History  Problem Relation Age of Onset   Heart failure Father        MV replacement   Myelodysplastic syndrome Father    Asthma Mother    Polycythemia Brother     Asthma Other    Hypertension Other    Diabetes Maternal Grandmother    Healthy Sister    Healthy Brother    Healthy Brother  Colon cancer Neg Hx    Pancreatic cancer Neg Hx    Stomach cancer Neg Hx    Esophageal cancer Neg Hx    Colon polyps Neg Hx    Rectal cancer Neg Hx     Social History Social History   Tobacco Use   Smoking status: Never   Smokeless tobacco: Never  Vaping Use   Vaping Use: Never used  Substance Use Topics   Alcohol use: No    Comment: Quit drinking ETOH 1986   Drug use: No   Allergies Eggs or egg-derived products and Tape  Review of Systems Review of Systems  Constitutional:  Negative for chills and fever.  HENT:  Negative for facial swelling and trouble swallowing.   Eyes:  Negative for photophobia and visual disturbance.  Respiratory:  Negative for cough and shortness of breath.   Cardiovascular:  Negative for chest pain and palpitations.  Gastrointestinal:  Negative for abdominal pain, nausea and vomiting.  Endocrine: Negative for polydipsia and polyuria.  Genitourinary:  Negative for difficulty urinating and hematuria.  Musculoskeletal:  Positive for arthralgias. Negative for gait problem and joint swelling.  Skin:  Negative for pallor and rash.  Neurological:  Negative for syncope and headaches.  Psychiatric/Behavioral:  Negative for agitation and confusion.     Physical Exam Vital Signs  I have reviewed the triage vital signs BP 138/75   Pulse 78   Temp 98.1 F (36.7 C)   Resp 16   Ht '5\' 3"'$  (1.6 m)   Wt 95 kg   SpO2 100%   BMI 37.10 kg/m  Physical Exam Vitals and nursing note reviewed.  Constitutional:      General: He is not in acute distress.    Appearance: Normal appearance. He is well-developed.  HENT:     Head: Normocephalic and atraumatic.     Right Ear: External ear normal.     Left Ear: External ear normal.     Mouth/Throat:     Mouth: Mucous membranes are moist.  Eyes:     General: No scleral  icterus. Cardiovascular:     Rate and Rhythm: Normal rate and regular rhythm.     Pulses: Normal pulses.     Heart sounds: Normal heart sounds.  Pulmonary:     Effort: Pulmonary effort is normal. No respiratory distress.     Breath sounds: Normal breath sounds.  Abdominal:     General: Abdomen is flat.     Palpations: Abdomen is soft.     Tenderness: There is no abdominal tenderness.  Musculoskeletal:     Cervical back: No rigidity.     Right lower leg: No edema.     Left lower leg: No edema.     Comments: Slight right shoulder pain, RUE is NVI, able to touch right hand to left shoulder. Reduced active ROM to RUE but this is his baseline   Skin:    General: Skin is warm and dry.     Capillary Refill: Capillary refill takes less than 2 seconds.  Neurological:     Mental Status: He is alert and oriented to person, place, and time.  Psychiatric:        Mood and Affect: Mood normal.        Behavior: Behavior normal.     ED Results and Treatments Labs (all labs ordered are listed, but only abnormal results are displayed) Labs Reviewed - No data to display  Radiology DG Shoulder Right  Result Date: 05/06/2022 CLINICAL DATA:  Dislocation EXAM: RIGHT SHOULDER - 2+ VIEW COMPARISON:  None Available. FINDINGS: Postsurgical changes from reverse right shoulder arthroplasty. Arthroplasty components are in their expected alignment without dislocation. Subtle cortical irregularity involving the posterior cortex of the proximal humerus adjacent to the humeral stem, indeterminate. Intact AC joint. No soft tissue abnormality. IMPRESSION: 1. Postsurgical changes from reverse right shoulder arthroplasty. No dislocation. 2. Subtle cortical irregularity involving the posterior cortex of the proximal humerus adjacent to the humeral stem, indeterminate. Nondisplaced periprosthetic fracture  not excluded. Correlate with point tenderness. Consider CT of the humerus with metal artifact reduction protocol if further imaging evaluation is warranted clinically. Electronically Signed   By: Davina Poke D.O.   On: 05/06/2022 16:10    Pertinent labs & imaging results that were available during my care of the patient were reviewed by me and considered in my medical decision making (see MDM for details).  Medications Ordered in ED Medications - No data to display                                                                                                                                   Procedures Procedures  (including critical care time)  Medical Decision Making / ED Course    Medical Decision Making:    Yasiel Goyne Hornback is a 71 y.o. male history as above, here with possible shoulder dislocation. The complaint involves an extensive differential diagnosis and also carries with it a high risk of complications and morbidity.  Serious etiology was considered. Ddx includes but is not limited to: sprain, strain, soft tissue, dislocation, fx, etc  Complete initial physical exam performed, notably the patient  was resting comfortably, NAD.    Reviewed and confirmed nursing documentation for past medical history, family history, social history.  Vital signs reviewed.   Pt here with possible dislocation right humerus, appears to have spontaneously reduced while in the ED. He is NVI to b/l UE. ROM improved, pain essentially resolved. ROM back to baseline. XR stable. D/w ortho, f/u office tomorrow.   The patient improved significantly and was discharged in stable condition. Detailed discussions were had with the patient regarding current findings, and need for close f/u with PCP or on call doctor. The patient has been instructed to return immediately if the symptoms worsen in any way for re-evaluation. Patient verbalized understanding and is in agreement with current care plan. All questions  answered prior to discharge.    Clinical Course as of 05/06/22 2039  Tue May 06, 2022  1706 [phone] ortho pa [SG]  1710 D/w Dr Onnie Graham, recommend f/u in office tomorrow AM, keep in sling [SG]    Clinical Course User Index [SG] Jeanell Sparrow, DO     Additional history obtained: -Additional history obtained from na -External records from outside source obtained and reviewed including: Chart review including previous notes, labs, imaging,  consultation notes including recent surgical intervention, home meds, prior labs/imaging, primary care documentation   Lab Tests: -I ordered, reviewed, and interpreted labs.   na  EKG   EKG Interpretation  Date/Time:    Ventricular Rate:    PR Interval:    QRS Duration:   QT Interval:    QTC Calculation:   R Axis:     Text Interpretation:           Imaging Studies ordered: I ordered imaging studies including shoulder xr I independently visualized the following imaging with scope of interpretation limited to determining acute life threatening conditions related to emergency care: xr, which revealed no dislocation, nondisplaced fx poss I independently visualized and interpreted imaging. I agree with the radiologist interpretation   Medicines ordered and prescription drug management: No orders of the defined types were placed in this encounter.   -I have reviewed the patients home medicines and have made adjustments as needed   Consultations Obtained: I requested consultation with the ortho,  and discussed lab and imaging findings as well as pertinent plan - they recommend: f/u office tomorrow am; known nondisplaced fx from prior surgery   Cardiac Monitoring: The patient was maintained on a cardiac monitor.  I personally viewed and interpreted the cardiac monitored which showed an underlying rhythm of: nsr  Social Determinants of Health:  Diagnosis or treatment significantly limited by social determinants of health:  na   Reevaluation: After the interventions noted above, I reevaluated the patient and found that they have resolved  Co morbidities that complicate the patient evaluation  Past Medical History:  Diagnosis Date   Allergy    Arthritis    knees   Asthma    Depression    Dysrhythmia    RBBB   Elevated PSA    [place] and bx negative 2011   Hypertension    nl cardiolite in 1998   Pneumonia 2005   right hospitalized    Rhinosinusitis    Sleep apnea    wears CPAP      Dispostion: Disposition decision including need for hospitalization was considered, and patient discharged from emergency department.    Final Clinical Impression(s) / ED Diagnoses Final diagnoses:  Dislocation of right shoulder joint, initial encounter     This chart was dictated using voice recognition software.  Despite best efforts to proofread,  errors can occur which can change the documentation meaning.    Jeanell Sparrow, DO 05/06/22 2039

## 2022-05-06 NOTE — Discharge Instructions (Addendum)
It was a pleasure caring for you today in the emergency department.  Please return to the emergency department for any worsening or worrisome symptoms.  Please wear sling until seen by orthopedics tomorrow morning, no heavy lifting to right upper extremity.

## 2022-05-07 ENCOUNTER — Other Ambulatory Visit: Payer: Self-pay

## 2022-05-07 ENCOUNTER — Encounter (HOSPITAL_COMMUNITY): Payer: Self-pay

## 2022-05-07 ENCOUNTER — Encounter (HOSPITAL_COMMUNITY)
Admission: RE | Admit: 2022-05-07 | Discharge: 2022-05-07 | Disposition: A | Discharge status: Home / Self Care | Payer: Medicare PPO | Source: Ambulatory Visit | Attending: Orthopedic Surgery | Admitting: Orthopedic Surgery

## 2022-05-07 VITALS — BP 151/87 | HR 65 | Temp 98.3°F | Resp 16 | Ht 63.0 in | Wt 205.0 lb

## 2022-05-07 DIAGNOSIS — Z01812 Encounter for preprocedural laboratory examination: Secondary | ICD-10-CM | POA: Insufficient documentation

## 2022-05-07 DIAGNOSIS — Z96611 Presence of right artificial shoulder joint: Secondary | ICD-10-CM | POA: Diagnosis not present

## 2022-05-07 DIAGNOSIS — I10 Essential (primary) hypertension: Secondary | ICD-10-CM | POA: Insufficient documentation

## 2022-05-07 DIAGNOSIS — Z471 Aftercare following joint replacement surgery: Secondary | ICD-10-CM | POA: Diagnosis not present

## 2022-05-07 DIAGNOSIS — Z01818 Encounter for other preprocedural examination: Secondary | ICD-10-CM

## 2022-05-07 LAB — CBC
HCT: 39.4 % (ref 39.0–52.0)
Hemoglobin: 13.4 g/dL (ref 13.0–17.0)
MCH: 36.6 pg — ABNORMAL HIGH (ref 26.0–34.0)
MCHC: 34 g/dL (ref 30.0–36.0)
MCV: 107.7 fL — ABNORMAL HIGH (ref 80.0–100.0)
Platelets: 174 10*3/uL (ref 150–400)
RBC: 3.66 MIL/uL — ABNORMAL LOW (ref 4.22–5.81)
RDW: 13.9 % (ref 11.5–15.5)
WBC: 8.3 10*3/uL (ref 4.0–10.5)
nRBC: 0 % (ref 0.0–0.2)

## 2022-05-07 LAB — BASIC METABOLIC PANEL
Anion gap: 7 (ref 5–15)
BUN: 12 mg/dL (ref 8–23)
CO2: 26 mmol/L (ref 22–32)
Calcium: 9.1 mg/dL (ref 8.9–10.3)
Chloride: 103 mmol/L (ref 98–111)
Creatinine, Ser: 0.66 mg/dL (ref 0.61–1.24)
GFR, Estimated: >60 mL/min (ref >60)
Glucose, Bld: 96 mg/dL (ref 70–99)
Potassium: 4.5 mmol/L (ref 3.5–5.1)
Sodium: 136 mmol/L (ref 135–145)

## 2022-05-07 LAB — SURGICAL PCR SCREEN
MRSA, PCR: NEGATIVE
Staphylococcus aureus: POSITIVE — AB

## 2022-05-07 NOTE — Patient Instructions (Signed)
SURGICAL WAITING ROOM VISITATION Patients having surgery or a procedure may have no more than 2 support people in the waiting area - these visitors may rotate.   Children under the age of 59 must have an adult with them who is not the patient. If the patient needs to stay at the hospital during part of their recovery, the visitor guidelines for inpatient rooms apply. Pre-op nurse will coordinate an appropriate time for 1 support person to accompany patient in pre-op.  This support person may not rotate.    Please refer to the [facility] website for the visitor guidelines for Inpatients (after your surgery is over and you are in a regular room).       Your procedure is scheduled on: 05-09-22   Report to [facility] Main Entrance    Report to admitting at   1:30 PM   Call this number if you have problems the morning of surgery [phone]   Do not eat food :After Midnight.   After Midnight you may have the following liquids until __1:00____ pm DAY OF SURGERY then nothing by mouth  Water Non-Citrus Juices (without pulp, NO RED) Carbonated Beverages Black Coffee (NO MILK/CREAM OR CREAMERS, sugar ok)  Clear Tea (NO MILK/CREAM OR CREAMERS, sugar ok) regular and decaf                             Plain Jell-O (NO RED)                                           Fruit ices (not with fruit pulp, NO RED)                                     Popsicles (NO RED)                                                               Sports drinks like Gatorade (NO RED)                    The day of surgery:  Drink ONE (1) Pre-Surgery Clear Ensure  at  1:00 PM the morning of surgery. Drink in one sitting. Do not sip.  This drink was given to you during your hospital  pre-op appointment visit. Nothing else to drink after completing the  Pre-Surgery Clear Ensure or G2.          If you have questions, please contact your surgeon's office.   FOLLOW  ANY ADDITIONAL PRE OP INSTRUCTIONS YOU  RECEIVED FROM YOUR SURGEON'S OFFICE!!!     Oral Hygiene is also important to reduce your risk of infection.                                    Remember - BRUSH YOUR TEETH THE MORNING OF SURGERY WITH YOUR REGULAR TOOTHPASTE  DENTURES WILL BE REMOVED PRIOR TO SURGERY PLEASE DO NOT APPLY "Poly grip" OR ADHESIVES!!!  Take these medicines the morning of surgery with A SIP OF WATER:  inhalers and bring them with you, fexofenadine, lexapro     Bring CPAP mask and tubing day of surgery.                              You may not have any metal on your body including hair pins, jewelry, and body piercing             Do not wear , lotions, powders, perfumes/cologne, or deodorant                Men may shave face and neck.   Do not bring valuables to the hospital. [place].   Contacts, glasses, dentures or bridgework may not be worn into surgery.   Bring small overnight bag day of surgery.   DO NOT [place]. PHARMACY WILL DISPENSE MEDICATIONS LISTED ON YOUR MEDICATION LIST TO YOU DURING YOUR ADMISSION [name]!    Patients discharged on the day of surgery will not be allowed to drive home.  Someone NEEDS to stay with you for the first 24 hours after anesthesia.                Please read over the following fact sheets you were given: IF [place] [phone]   If you received a COVID test during your pre-op visit  it is requested that you wear a mask when out in public, stay away from anyone that may not be feeling well and notify your surgeon if you develop symptoms. If you test positive for Covid or have been in contact with anyone that has tested positive in the last 10 days please notify you surgeon.   [place]- Preparing for Total Shoulder Arthroplasty    Before surgery, you can play an important role. Because skin is not sterile,  your skin needs to be as free of germs as possible. You can reduce the number of germs on your skin by using the following products. Benzoyl Peroxide Gel Reduces the number of germs present on the skin Applied twice a day to shoulder area starting two days before surgery    ==================================================================  Please follow these instructions carefully:  BENZOYL PEROXIDE 5% GEL  Please do not use if you have an allergy to benzoyl peroxide.   If your skin becomes reddened/irritated stop using the benzoyl peroxide.  Starting two days before surgery, apply as follows: Apply benzoyl peroxide in the morning and at night. Apply after taking a shower. If you are not taking a shower clean entire shoulder front, back, and side along with the armpit with a clean wet washcloth.  Place a quarter-sized dollop on your shoulder and rub in thoroughly, making sure to cover the front, back, and side of your shoulder, along with the armpit.   2 days before ____ AM   ____ PM              1 day before ____ AM   ____ PM                         Do this twice a day for two days.  (Last application is the night before surgery, AFTER  using the CHG soap as described below).  Do NOT apply benzoyl peroxide gel on the day of surgery.      [place] - Preparing for Surgery Before surgery, you can play an important role.  Because skin is not sterile, your skin needs to be as free of germs as possible.  You can reduce the number of germs on your skin by washing with CHG (chlorahexidine gluconate) soap before surgery.  CHG is an antiseptic cleaner which kills germs and bonds with the skin to continue killing germs even after washing. Please DO NOT use if you have an allergy to CHG or antibacterial soaps.  If your skin becomes reddened/irritated stop using the CHG and inform your nurse when you arrive at Short Stay. Do not shave (including legs and underarms) for at least 48 hours prior to  the first CHG shower.  You may shave your face/neck. Please follow these instructions carefully:  1.  Shower with CHG Soap the night before surgery and the  morning of Surgery.  2.  If you choose to wash your hair, wash your hair first as usual with your  normal  shampoo.  3.  After you shampoo, rinse your hair and body thoroughly to remove the  shampoo.                           4.  Use CHG as you would any other liquid soap.  You can apply chg directly  to the skin and wash                       Gently with a scrungie or clean washcloth.  5.  Apply the CHG Soap to your body ONLY FROM THE NECK DOWN.   Do not use on face/ open                           Wound or open sores. Avoid contact with eyes, ears mouth and genitals (private parts).                       Wash face,  Genitals (private parts) with your normal soap.             6.  Wash thoroughly, paying special attention to the area where your surgery  will be performed.  7.  Thoroughly rinse your body with warm water from the neck down.  8.  DO NOT shower/wash with your normal soap after using and rinsing off  the CHG Soap.                9.  Pat yourself dry with a clean towel.            10.  Wear clean pajamas.            11.  Place clean sheets on your bed the night of your first shower and do not  sleep with pets. Day of Surgery : Do not apply any lotions/deodorants the morning of surgery.  Please wear clean clothes to the hospital/surgery center.  FAILURE TO FOLLOW THESE INSTRUCTIONS MAY RESULT IN THE CANCELLATION OF YOUR SURGERY PATIENT SIGNATURE_________________________________  NURSE SIGNATURE__________________________________  ________________________________________________________________________  [name]  An incentive spirometer is a tool that can help keep your lungs clear and active. This tool measures how well you are filling your lungs with each breath. Taking long  deep breaths may help reverse or  decrease the chance of developing breathing (pulmonary) problems (especially infection) following: A long period of time when you are unable to move or be active. BEFORE THE PROCEDURE  If the spirometer includes an indicator to show your best effort, your nurse or respiratory therapist will set it to a desired goal. If possible, sit up straight or lean slightly forward. Try not to slouch. Hold the incentive spirometer in an upright position. INSTRUCTIONS FOR USE  Sit on the edge of your bed if possible, or sit up as far as you can in bed or on a chair. Hold the incentive spirometer in an upright position. Breathe out normally. Place the mouthpiece in your mouth and seal your lips tightly around it. Breathe in slowly and as deeply as possible, raising the piston or the ball toward the top of the column. Hold your breath for 3-5 seconds or for as long as possible. Allow the piston or ball to fall to the bottom of the column. Remove the mouthpiece from your mouth and breathe out normally. Rest for a few seconds and repeat Steps 1 through 7 at least 10 times every 1-2 hours when you are awake. Take your time and take a few normal breaths between deep breaths. The spirometer may include an indicator to show your best effort. Use the indicator as a goal to work toward during each repetition. After each set of 10 deep breaths, practice coughing to be sure your lungs are clear. If you have an incision (the cut made at the time of surgery), support your incision when coughing by placing a pillow or rolled up towels firmly against it. Once you are able to get out of bed, walk around indoors and cough well. You may stop using the incentive spirometer when instructed by your caregiver.  RISKS AND COMPLICATIONS Take your time so you do not get dizzy or light-headed. If you are in pain, you may need to take or ask for pain medication before doing incentive spirometry. It is harder to take a deep breath if you  are having pain. AFTER USE Rest and breathe slowly and easily. It can be helpful to keep track of a log of your progress. Your caregiver can provide you with a simple table to help with this. If you are using the spirometer at home, follow these instructions: [place] IF:  You are having difficultly using the spirometer. You have trouble using the spirometer as often as instructed. Your pain medication is not giving enough relief while using the spirometer. You develop fever of 100.5 F (38.1 C) or higher. SEEK IMMEDIATE MEDICAL CARE IF:  You cough up bloody sputum that had not been present before. You develop fever of 102 F (38.9 C) or greater. You develop worsening pain at or near the incision site. MAKE SURE YOU:  Understand these instructions. Will watch your condition. Will get help right away if you are not doing well or get worse. Document Released: 07/28/2006 Document Revised: 06/09/2011 Document Reviewed: 09/28/2006 [facility] Patient Information 2014 [place], [place].   ________________________________________________________________________

## 2022-05-07 NOTE — Progress Notes (Signed)
Please place orders in epic pt. Is scheduled for preop today at 2 pm

## 2022-05-07 NOTE — Progress Notes (Addendum)
PCP - Shanon Ace, MD LOV 10-24 23 epic Cardiologist - no  PPM/ICD -  Device Orders -  Rep Notified -   Chest x-ray - 2019 EKG - 11-22-21 epic Stress Test -  ECHO -  Cardiac Cath -   Sleep Study -  CPAP - YES  Fasting Blood Sugar -  Checks Blood Sugar _____ times a day  Blood Thinner Instructions: Aspirin Instructions:  ERAS Protcol - PRE-SURGERY Ensure    COVID TEST-  COVID vaccine -  Activity-- Anesthesia review: HTN ,asthma, OSA,  Patient denies shortness of breath, fever, cough and chest pain at PAT appointment   All instructions explained to the patient, with a verbal understanding of the material. Patient agrees to go over the instructions while at home for a better understanding. Patient also instructed to self quarantine after being tested for COVID-19. The opportunity to ask questions was provided.

## 2022-05-07 NOTE — Patient Instructions (Signed)
SURGICAL WAITING ROOM VISITATION Patients having surgery or a procedure may have no more than 2 support people in the waiting area - these visitors may rotate.   Children under the age of 78 must have an adult with them who is not the patient. If the patient needs to stay at the hospital during part of their recovery, the visitor guidelines for inpatient rooms apply. Pre-op nurse will coordinate an appropriate time for 1 support person to accompany patient in pre-op.  This support person may not rotate.    Please refer to the [facility] website for the visitor guidelines for Inpatients (after your surgery is over and you are in a regular room).       Your procedure is scheduled on: 05-09-22   Report to [facility] Main Entrance    Report to admitting at   1:30 PM   Call this number if you have problems the morning of surgery [phone]   Do not eat food :After Midnight.   After Midnight you may have the following liquids until __1:00____ pm DAY OF SURGERY then nothing by mouth  Water Non-Citrus Juices (without pulp, NO RED) Carbonated Beverages Black Coffee (NO MILK/CREAM OR CREAMERS, sugar ok)  Clear Tea (NO MILK/CREAM OR CREAMERS, sugar ok) regular and decaf                             Plain Jell-O (NO RED)                                           Fruit ices (not with fruit pulp, NO RED)                                     Popsicles (NO RED)                                                               Sports drinks like Gatorade (NO RED)                    The day of surgery:  Drink ONE (1) Pre-Surgery Clear Ensure  at  1:00 PM the morning of surgery. Drink in one sitting. Do not sip.  This drink was given to you during your hospital  pre-op appointment visit. Nothing else to drink after completing the  Pre-Surgery Clear Ensure or G2.          If you have questions, please contact your surgeon's office.   FOLLOW  ANY ADDITIONAL PRE OP INSTRUCTIONS YOU  RECEIVED FROM YOUR SURGEON'S OFFICE!!!     Oral Hygiene is also important to reduce your risk of infection.                                    Remember - BRUSH YOUR TEETH THE MORNING OF SURGERY WITH YOUR REGULAR TOOTHPASTE  DENTURES WILL BE REMOVED PRIOR TO SURGERY PLEASE DO NOT APPLY "Poly grip" OR ADHESIVES!!!  Take these medicines the morning of surgery with A SIP OF WATER:  inhalers and bring them with you, fexofenadine, lexapro     Bring CPAP mask and tubing day of surgery.                              You may not have any metal on your body including hair pins, jewelry, and body piercing             Do not wear , lotions, powders, perfumes/cologne, or deodorant                Men may shave face and neck.   Do not bring valuables to the hospital. [name].   Contacts, glasses, dentures or bridgework may not be worn into surgery.   Bring small overnight bag day of surgery.   DO NOT [place]. PHARMACY WILL DISPENSE MEDICATIONS LISTED ON YOUR MEDICATION LIST TO YOU DURING YOUR ADMISSION [place]!    Patients discharged on the day of surgery will not be allowed to drive home.  Someone NEEDS to stay with you for the first 24 hours after anesthesia.                Please read over the following fact sheets you were given: IF [name] [phone]   If you received a COVID test during your pre-op visit  it is requested that you wear a mask when out in public, stay away from anyone that may not be feeling well and notify your surgeon if you develop symptoms. If you test positive for Covid or have been in contact with anyone that has tested positive in the last 10 days please notify you surgeon.   [name]- Preparing for Total Shoulder Arthroplasty    Before surgery, you can play an important role. Because skin is not sterile,  your skin needs to be as free of germs as possible. You can reduce the number of germs on your skin by using the following products. Benzoyl Peroxide Gel Reduces the number of germs present on the skin Applied twice a day to shoulder area starting two days before surgery    ==================================================================  Please follow these instructions carefully:  BENZOYL PEROXIDE 5% GEL  Please do not use if you have an allergy to benzoyl peroxide.   If your skin becomes reddened/irritated stop using the benzoyl peroxide.  Starting two days before surgery, apply as follows: Apply benzoyl peroxide in the morning and at night. Apply after taking a shower. If you are not taking a shower clean entire shoulder front, back, and side along with the armpit with a clean wet washcloth.  Place a quarter-sized dollop on your shoulder and rub in thoroughly, making sure to cover the front, back, and side of your shoulder, along with the armpit.   2 days before ____ AM   ____ PM              1 day before ____ AM   ____ PM                         Do this twice a day for two days.  (Last application is the night before surgery, AFTER  using the CHG soap as described below).  Do NOT apply benzoyl peroxide gel on the day of surgery.      [place] - Preparing for Surgery Before surgery, you can play an important role.  Because skin is not sterile, your skin needs to be as free of germs as possible.  You can reduce the number of germs on your skin by washing with CHG (chlorahexidine gluconate) soap before surgery.  CHG is an antiseptic cleaner which kills germs and bonds with the skin to continue killing germs even after washing. Please DO NOT use if you have an allergy to CHG or antibacterial soaps.  If your skin becomes reddened/irritated stop using the CHG and inform your nurse when you arrive at Short Stay. Do not shave (including legs and underarms) for at least 48 hours prior to  the first CHG shower.  You may shave your face/neck. Please follow these instructions carefully:  1.  Shower with CHG Soap the night before surgery and the  morning of Surgery.  2.  If you choose to wash your hair, wash your hair first as usual with your  normal  shampoo.  3.  After you shampoo, rinse your hair and body thoroughly to remove the  shampoo.                           4.  Use CHG as you would any other liquid soap.  You can apply chg directly  to the skin and wash                       Gently with a scrungie or clean washcloth.  5.  Apply the CHG Soap to your body ONLY FROM THE NECK DOWN.   Do not use on face/ open                           Wound or open sores. Avoid contact with eyes, ears mouth and genitals (private parts).                       Wash face,  Genitals (private parts) with your normal soap.             6.  Wash thoroughly, paying special attention to the area where your surgery  will be performed.  7.  Thoroughly rinse your body with warm water from the neck down.  8.  DO NOT shower/wash with your normal soap after using and rinsing off  the CHG Soap.                9.  Pat yourself dry with a clean towel.            10.  Wear clean pajamas.            11.  Place clean sheets on your bed the night of your first shower and do not  sleep with pets. Day of Surgery : Do not apply any lotions/deodorants the morning of surgery.  Please wear clean clothes to the hospital/surgery center.  FAILURE TO FOLLOW THESE INSTRUCTIONS MAY RESULT IN THE CANCELLATION OF YOUR SURGERY PATIENT SIGNATURE_________________________________  NURSE SIGNATURE__________________________________  ________________________________________________________________________  [name]  An incentive spirometer is a tool that can help keep your lungs clear and active. This tool measures how well you are filling your lungs with each breath. Taking long  deep breaths may help reverse or  decrease the chance of developing breathing (pulmonary) problems (especially infection) following: A long period of time when you are unable to move or be active. BEFORE THE PROCEDURE  If the spirometer includes an indicator to show your best effort, your nurse or respiratory therapist will set it to a desired goal. If possible, sit up straight or lean slightly forward. Try not to slouch. Hold the incentive spirometer in an upright position. INSTRUCTIONS FOR USE  Sit on the edge of your bed if possible, or sit up as far as you can in bed or on a chair. Hold the incentive spirometer in an upright position. Breathe out normally. Place the mouthpiece in your mouth and seal your lips tightly around it. Breathe in slowly and as deeply as possible, raising the piston or the ball toward the top of the column. Hold your breath for 3-5 seconds or for as long as possible. Allow the piston or ball to fall to the bottom of the column. Remove the mouthpiece from your mouth and breathe out normally. Rest for a few seconds and repeat Steps 1 through 7 at least 10 times every 1-2 hours when you are awake. Take your time and take a few normal breaths between deep breaths. The spirometer may include an indicator to show your best effort. Use the indicator as a goal to work toward during each repetition. After each set of 10 deep breaths, practice coughing to be sure your lungs are clear. If you have an incision (the cut made at the time of surgery), support your incision when coughing by placing a pillow or rolled up towels firmly against it. Once you are able to get out of bed, walk around indoors and cough well. You may stop using the incentive spirometer when instructed by your caregiver.  RISKS AND COMPLICATIONS Take your time so you do not get dizzy or light-headed. If you are in pain, you may need to take or ask for pain medication before doing incentive spirometry. It is harder to take a deep breath if you  are having pain. AFTER USE Rest and breathe slowly and easily. It can be helpful to keep track of a log of your progress. Your caregiver can provide you with a simple table to help with this. If you are using the spirometer at home, follow these instructions: [name] IF:  You are having difficultly using the spirometer. You have trouble using the spirometer as often as instructed. Your pain medication is not giving enough relief while using the spirometer. You develop fever of 100.5 F (38.1 C) or higher. SEEK IMMEDIATE MEDICAL CARE IF:  You cough up bloody sputum that had not been present before. You develop fever of 102 F (38.9 C) or greater. You develop worsening pain at or near the incision site. MAKE SURE YOU:  Understand these instructions. Will watch your condition. Will get help right away if you are not doing well or get worse. Document Released: 07/28/2006 Document Revised: 06/09/2011 Document Reviewed: 09/28/2006 [facility] Patient Information 2014 [place], [place].   ________________________________________________________________________

## 2022-05-08 NOTE — Progress Notes (Signed)
PCR results sent to Dr. Onnie Graham to review.

## 2022-05-09 ENCOUNTER — Encounter (HOSPITAL_COMMUNITY)
Admission: RE | Disposition: A | Discharge status: Home / Self Care | Payer: Self-pay | Source: Home / Self Care | Attending: Orthopedic Surgery

## 2022-05-09 ENCOUNTER — Inpatient Hospital Stay (HOSPITAL_COMMUNITY): Payer: Medicare PPO

## 2022-05-09 ENCOUNTER — Inpatient Hospital Stay (HOSPITAL_COMMUNITY): Payer: Medicare PPO | Admitting: Anesthesiology

## 2022-05-09 ENCOUNTER — Encounter (HOSPITAL_COMMUNITY): Payer: Self-pay | Admitting: Orthopedic Surgery

## 2022-05-09 ENCOUNTER — Other Ambulatory Visit: Payer: Self-pay

## 2022-05-09 ENCOUNTER — Inpatient Hospital Stay (HOSPITAL_COMMUNITY)
Admission: RE | Admit: 2022-05-09 | Discharge: 2022-05-10 | DRG: 483 | Disposition: A | Discharge status: Home / Self Care | Payer: Medicare PPO | Source: Ambulatory Visit | Attending: Orthopedic Surgery | Admitting: Orthopedic Surgery

## 2022-05-09 DIAGNOSIS — Z79899 Other long term (current) drug therapy: Secondary | ICD-10-CM | POA: Diagnosis not present

## 2022-05-09 DIAGNOSIS — J45909 Unspecified asthma, uncomplicated: Secondary | ICD-10-CM | POA: Diagnosis present

## 2022-05-09 DIAGNOSIS — Z833 Family history of diabetes mellitus: Secondary | ICD-10-CM | POA: Diagnosis not present

## 2022-05-09 DIAGNOSIS — Z96651 Presence of right artificial knee joint: Secondary | ICD-10-CM | POA: Diagnosis present

## 2022-05-09 DIAGNOSIS — I10 Essential (primary) hypertension: Secondary | ICD-10-CM | POA: Diagnosis present

## 2022-05-09 DIAGNOSIS — I440 Atrioventricular block, first degree: Secondary | ICD-10-CM | POA: Diagnosis present

## 2022-05-09 DIAGNOSIS — Z825 Family history of asthma and other chronic lower respiratory diseases: Secondary | ICD-10-CM | POA: Diagnosis not present

## 2022-05-09 DIAGNOSIS — Z8249 Family history of ischemic heart disease and other diseases of the circulatory system: Secondary | ICD-10-CM

## 2022-05-09 DIAGNOSIS — T84028A Dislocation of other internal joint prosthesis, initial encounter: Secondary | ICD-10-CM | POA: Diagnosis present

## 2022-05-09 DIAGNOSIS — T84098A Other mechanical complication of other internal joint prosthesis, initial encounter: Secondary | ICD-10-CM | POA: Diagnosis not present

## 2022-05-09 DIAGNOSIS — I4510 Unspecified right bundle-branch block: Secondary | ICD-10-CM | POA: Diagnosis present

## 2022-05-09 DIAGNOSIS — G4733 Obstructive sleep apnea (adult) (pediatric): Secondary | ICD-10-CM | POA: Diagnosis present

## 2022-05-09 DIAGNOSIS — Z96619 Presence of unspecified artificial shoulder joint: Secondary | ICD-10-CM

## 2022-05-09 DIAGNOSIS — Z96611 Presence of right artificial shoulder joint: Secondary | ICD-10-CM | POA: Diagnosis not present

## 2022-05-09 DIAGNOSIS — Z471 Aftercare following joint replacement surgery: Secondary | ICD-10-CM | POA: Diagnosis not present

## 2022-05-09 DIAGNOSIS — Z01818 Encounter for other preprocedural examination: Principal | ICD-10-CM

## 2022-05-09 HISTORY — PX: TOTAL SHOULDER REVISION: SHX6130

## 2022-05-09 LAB — TYPE AND SCREEN
ABO/RH(D): O POS
Antibody Screen: NEGATIVE

## 2022-05-09 SURGERY — REVISION, TOTAL ARTHROPLASTY, SHOULDER
Anesthesia: Regional | Site: Shoulder | Laterality: Right

## 2022-05-09 MED ORDER — DEXAMETHASONE SODIUM PHOSPHATE 10 MG/ML IJ SOLN
INTRAMUSCULAR | Status: AC
  Filled 2022-05-09: qty 1

## 2022-05-09 MED ORDER — STERILE WATER FOR IRRIGATION IR SOLN
Status: DC | PRN
  Administered 2022-05-09: 2000 mL

## 2022-05-09 MED ORDER — METHOCARBAMOL 500 MG IVPB - SIMPLE MED: 500.0000 mg | Freq: Four times a day (QID) | INTRAVENOUS | Status: DC | PRN

## 2022-05-09 MED ORDER — ONDANSETRON HCL 4 MG/2ML IJ SOLN
INTRAMUSCULAR | Status: DC | PRN
  Administered 2022-05-09: 4 mg via INTRAVENOUS

## 2022-05-09 MED ORDER — POLYETHYLENE GLYCOL 3350 17 G PO PACK: 17.0000 g | PACK | Freq: Every day | ORAL | Status: DC | PRN

## 2022-05-09 MED ORDER — AMLODIPINE BESY-BENAZEPRIL HCL 5-10 MG PO CAPS: 1.0000 | ORAL_CAPSULE | Freq: Every day | ORAL | Status: DC

## 2022-05-09 MED ORDER — PHENYLEPHRINE HCL-NACL 20-0.9 MG/250ML-% IV SOLN
INTRAVENOUS | Status: DC | PRN
  Administered 2022-05-09: 50 ug/min via INTRAVENOUS

## 2022-05-09 MED ORDER — ESCITALOPRAM OXALATE 10 MG PO TABS
10.0000 mg | ORAL_TABLET | Freq: Every day | ORAL | Status: DC
  Administered 2022-05-09 – 2022-05-10 (×2): 10 mg via ORAL
  Filled 2022-05-09 (×2): qty 1

## 2022-05-09 MED ORDER — LACTATED RINGERS IV SOLN: INTRAVENOUS | Status: DC

## 2022-05-09 MED ORDER — LIDOCAINE 2% (20 MG/ML) 5 ML SYRINGE
INTRAMUSCULAR | Status: DC | PRN
  Administered 2022-05-09: 100 mg via INTRAVENOUS

## 2022-05-09 MED ORDER — PROPOFOL 10 MG/ML IV BOLUS
INTRAVENOUS | Status: AC
  Filled 2022-05-09: qty 20

## 2022-05-09 MED ORDER — SUCCINYLCHOLINE CHLORIDE 200 MG/10ML IV SOSY
PREFILLED_SYRINGE | INTRAVENOUS | Status: DC | PRN
  Administered 2022-05-09: 100 mg via INTRAVENOUS

## 2022-05-09 MED ORDER — ONDANSETRON HCL 4 MG PO TABS: 4.0000 mg | ORAL_TABLET | Freq: Four times a day (QID) | ORAL | Status: DC | PRN

## 2022-05-09 MED ORDER — ONDANSETRON HCL 4 MG/2ML IJ SOLN: 4.0000 mg | Freq: Four times a day (QID) | INTRAMUSCULAR | Status: DC | PRN

## 2022-05-09 MED ORDER — VANCOMYCIN HCL 1000 MG IV SOLR
INTRAVENOUS | Status: AC
  Filled 2022-05-09: qty 20

## 2022-05-09 MED ORDER — METOCLOPRAMIDE HCL 5 MG/ML IJ SOLN: 5.0000 mg | Freq: Three times a day (TID) | INTRAMUSCULAR | Status: DC | PRN

## 2022-05-09 MED ORDER — FENTANYL CITRATE PF 50 MCG/ML IJ SOSY
50.0000 ug | PREFILLED_SYRINGE | INTRAMUSCULAR | Status: DC
  Administered 2022-05-09: 50 ug via INTRAVENOUS
  Filled 2022-05-09: qty 2

## 2022-05-09 MED ORDER — ORAL CARE MOUTH RINSE: 15.0000 mL | Freq: Once | OROMUCOSAL | Status: AC

## 2022-05-09 MED ORDER — PANTOPRAZOLE SODIUM 40 MG PO TBEC
40.0000 mg | DELAYED_RELEASE_TABLET | Freq: Every day | ORAL | Status: DC
  Administered 2022-05-09 – 2022-05-10 (×2): 40 mg via ORAL
  Filled 2022-05-09 (×2): qty 1

## 2022-05-09 MED ORDER — BENAZEPRIL HCL 20 MG PO TABS
10.0000 mg | ORAL_TABLET | Freq: Every day | ORAL | Status: DC
  Administered 2022-05-09 – 2022-05-10 (×2): 10 mg via ORAL
  Filled 2022-05-09 (×2): qty 1

## 2022-05-09 MED ORDER — PROPOFOL 10 MG/ML IV BOLUS
INTRAVENOUS | Status: DC | PRN
  Administered 2022-05-09: 150 mg via INTRAVENOUS

## 2022-05-09 MED ORDER — METHOCARBAMOL 500 MG PO TABS: 500.0000 mg | ORAL_TABLET | Freq: Four times a day (QID) | ORAL | Status: DC | PRN

## 2022-05-09 MED ORDER — IBUPROFEN 800 MG PO TABS: 800.0000 mg | ORAL_TABLET | Freq: Three times a day (TID) | ORAL | 2 refills | Status: DC | PRN

## 2022-05-09 MED ORDER — VANCOMYCIN HCL 1000 MG IV SOLR
INTRAVENOUS | Status: DC | PRN
  Administered 2022-05-09: 1000 mg via TOPICAL

## 2022-05-09 MED ORDER — MIDAZOLAM HCL 2 MG/2ML IJ SOLN
1.0000 mg | INTRAMUSCULAR | Status: DC
  Filled 2022-05-09: qty 2

## 2022-05-09 MED ORDER — SUGAMMADEX SODIUM 200 MG/2ML IV SOLN
INTRAVENOUS | Status: DC | PRN
  Administered 2022-05-09: 400 mg via INTRAVENOUS

## 2022-05-09 MED ORDER — CEFAZOLIN SODIUM-DEXTROSE 2-4 GM/100ML-% IV SOLN
2.0000 g | INTRAVENOUS | Status: AC
  Administered 2022-05-09: 2 g via INTRAVENOUS
  Filled 2022-05-09: qty 100

## 2022-05-09 MED ORDER — FLUTICASONE PROPIONATE 50 MCG/ACT NA SUSP
2.0000 | Freq: Every day | NASAL | Status: DC
  Administered 2022-05-10: 2 via NASAL
  Filled 2022-05-09: qty 16

## 2022-05-09 MED ORDER — ACETAMINOPHEN 500 MG PO TABS
1000.0000 mg | ORAL_TABLET | Freq: Once | ORAL | Status: AC
  Administered 2022-05-09: 1000 mg via ORAL
  Filled 2022-05-09: qty 2

## 2022-05-09 MED ORDER — AZELASTINE HCL 0.1 % NA SOLN: 1.0000 | Freq: Two times a day (BID) | NASAL | Status: DC | PRN

## 2022-05-09 MED ORDER — EPHEDRINE SULFATE-NACL 50-0.9 MG/10ML-% IV SOSY
PREFILLED_SYRINGE | INTRAVENOUS | Status: DC | PRN
  Administered 2022-05-09: 10 mg via INTRAVENOUS
  Administered 2022-05-09 (×3): 5 mg via INTRAVENOUS

## 2022-05-09 MED ORDER — BUPIVACAINE LIPOSOME 1.3 % IJ SUSP
INTRAMUSCULAR | Status: DC | PRN
  Administered 2022-05-09: 10 mL via PERINEURAL

## 2022-05-09 MED ORDER — TRANEXAMIC ACID-NACL 1000-0.7 MG/100ML-% IV SOLN
1000.0000 mg | INTRAVENOUS | Status: AC
  Administered 2022-05-09: 1000 mg via INTRAVENOUS
  Filled 2022-05-09: qty 100

## 2022-05-09 MED ORDER — FENTANYL CITRATE PF 50 MCG/ML IJ SOSY: 25.0000 ug | PREFILLED_SYRINGE | INTRAMUSCULAR | Status: DC | PRN

## 2022-05-09 MED ORDER — OXYCODONE-ACETAMINOPHEN 5-325 MG PO TABS: 1.0000 | ORAL_TABLET | ORAL | 0 refills | Status: DC | PRN

## 2022-05-09 MED ORDER — AMLODIPINE BESYLATE 5 MG PO TABS
5.0000 mg | ORAL_TABLET | Freq: Every day | ORAL | Status: DC
  Administered 2022-05-09 – 2022-05-10 (×2): 5 mg via ORAL
  Filled 2022-05-09 (×2): qty 1

## 2022-05-09 MED ORDER — ROCURONIUM BROMIDE 50 MG/5ML IV SOSY
PREFILLED_SYRINGE | INTRAVENOUS | Status: DC | PRN
  Administered 2022-05-09: 60 mg via INTRAVENOUS
  Administered 2022-05-09: 40 mg via INTRAVENOUS

## 2022-05-09 MED ORDER — CHLORHEXIDINE GLUCONATE 0.12 % MT SOLN
15.0000 mL | Freq: Once | OROMUCOSAL | Status: AC
  Administered 2022-05-09: 15 mL via OROMUCOSAL

## 2022-05-09 MED ORDER — ONDANSETRON HCL 4 MG PO TABS: 4.0000 mg | ORAL_TABLET | Freq: Three times a day (TID) | ORAL | 0 refills | Status: DC | PRN

## 2022-05-09 MED ORDER — OXYCODONE HCL 5 MG PO TABS: 10.0000 mg | ORAL_TABLET | ORAL | Status: DC | PRN

## 2022-05-09 MED ORDER — POLYVINYL ALCOHOL 1.4 % OP SOLN: 1.0000 [drp] | Freq: Three times a day (TID) | OPHTHALMIC | Status: DC | PRN

## 2022-05-09 MED ORDER — AMISULPRIDE (ANTIEMETIC) 5 MG/2ML IV SOLN: 10.0000 mg | Freq: Once | INTRAVENOUS | Status: DC | PRN

## 2022-05-09 MED ORDER — ONDANSETRON HCL 4 MG/2ML IJ SOLN
INTRAMUSCULAR | Status: AC
  Filled 2022-05-09: qty 2

## 2022-05-09 MED ORDER — TAMSULOSIN HCL 0.4 MG PO CAPS
0.4000 mg | ORAL_CAPSULE | Freq: Every day | ORAL | Status: DC
  Administered 2022-05-09: 0.4 mg via ORAL
  Filled 2022-05-09: qty 1

## 2022-05-09 MED ORDER — METOCLOPRAMIDE HCL 5 MG PO TABS: 5.0000 mg | ORAL_TABLET | Freq: Three times a day (TID) | ORAL | Status: DC | PRN

## 2022-05-09 MED ORDER — 0.9 % SODIUM CHLORIDE (POUR BTL) OPTIME
TOPICAL | Status: DC | PRN
  Administered 2022-05-09: 1000 mL

## 2022-05-09 MED ORDER — EPHEDRINE 5 MG/ML INJ
INTRAVENOUS | Status: AC
  Filled 2022-05-09: qty 15

## 2022-05-09 MED ORDER — BUPIVACAINE HCL (PF) 0.5 % IJ SOLN
INTRAMUSCULAR | Status: DC | PRN
  Administered 2022-05-09: 10 mL via PERINEURAL

## 2022-05-09 MED ORDER — TRANEXAMIC ACID 1000 MG/10ML IV SOLN: 1000.0000 mg | INTRAVENOUS | Status: DC

## 2022-05-09 MED ORDER — CYCLOBENZAPRINE HCL 10 MG PO TABS: 10.0000 mg | ORAL_TABLET | Freq: Three times a day (TID) | ORAL | 1 refills | Status: DC | PRN

## 2022-05-09 MED ORDER — PROMETHAZINE HCL 25 MG/ML IJ SOLN: 6.2500 mg | INTRAMUSCULAR | Status: DC | PRN

## 2022-05-09 MED ORDER — OXYCODONE HCL 5 MG PO TABS: 5.0000 mg | ORAL_TABLET | ORAL | Status: DC | PRN

## 2022-05-09 MED ORDER — IPRATROPIUM-ALBUTEROL 0.5-2.5 (3) MG/3ML IN SOLN: 3.0000 mL | Freq: Four times a day (QID) | RESPIRATORY_TRACT | Status: DC | PRN

## 2022-05-09 MED ORDER — HYDROMORPHONE HCL 1 MG/ML IJ SOLN: 0.5000 mg | INTRAMUSCULAR | Status: DC | PRN

## 2022-05-09 MED ORDER — DEXAMETHASONE SODIUM PHOSPHATE 10 MG/ML IJ SOLN
INTRAMUSCULAR | Status: DC | PRN
  Administered 2022-05-09: 10 mg via INTRAVENOUS

## 2022-05-09 MED ORDER — SCOPOLAMINE 1 MG/3DAYS TD PT72
1.0000 | MEDICATED_PATCH | TRANSDERMAL | Status: DC
  Administered 2022-05-09: 1.5 mg via TRANSDERMAL
  Filled 2022-05-09: qty 1

## 2022-05-09 MED ORDER — FLUTICASONE FUROATE-VILANTEROL 100-25 MCG/ACT IN AEPB
1.0000 | INHALATION_SPRAY | Freq: Every day | RESPIRATORY_TRACT | Status: DC
  Administered 2022-05-09 – 2022-05-10 (×2): 1 via RESPIRATORY_TRACT
  Filled 2022-05-09: qty 28

## 2022-05-09 MED ORDER — ROCURONIUM BROMIDE 10 MG/ML (PF) SYRINGE
PREFILLED_SYRINGE | INTRAVENOUS | Status: AC
  Filled 2022-05-09: qty 10

## 2022-05-09 MED ORDER — ACETAMINOPHEN 325 MG PO TABS: 325.0000 mg | ORAL_TABLET | Freq: Four times a day (QID) | ORAL | Status: DC | PRN

## 2022-05-09 SURGICAL SUPPLY — 72 items
ADH SKN CLS APL DERMABOND .7 (GAUZE/BANDAGES/DRESSINGS) ×1
AID PSTN UNV HD RSTRNT DISP (MISCELLANEOUS) ×1
BAG COUNTER SPONGE SURGICOUNT (BAG) IMPLANT
BAG SPEC THK2 15X12 ZIP CLS (MISCELLANEOUS) ×1
BAG SPNG CNTER NS LX DISP (BAG)
BAG ZIPLOCK 12X15 (MISCELLANEOUS) ×2 IMPLANT
BLADE SAW SGTL 83.5X18.5 (BLADE) IMPLANT
CAP LOCKING COCR (Cap) IMPLANT
COMP REV CNTR SHLD RSA +12 (Shoulder) ×1 IMPLANT
COMPONENT REV CNTR SHLD RSA+12 (Shoulder) IMPLANT
COOLER ICEMAN CLASSIC (MISCELLANEOUS) IMPLANT
COVER BACK TABLE 60X90IN (DRAPES) ×2 IMPLANT
COVER SURGICAL LIGHT HANDLE (MISCELLANEOUS) ×2 IMPLANT
DERMABOND ADVANCED .7 DNX12 (GAUZE/BANDAGES/DRESSINGS) ×2 IMPLANT
DRAPE C-ARM 42X120 X-RAY (DRAPES) IMPLANT
DRAPE C-ARMOR (DRAPES) IMPLANT
DRAPE INCISE IOBAN 66X45 STRL (DRAPES) IMPLANT
DRAPE ORTHO SPLIT 77X108 STRL (DRAPES) ×2
DRAPE SHEET LG 3/4 BI-LAMINATE (DRAPES) ×2 IMPLANT
DRAPE SURG 17X11 SM STRL (DRAPES) ×2 IMPLANT
DRAPE SURG ORHT 6 SPLT 77X108 (DRAPES) ×4 IMPLANT
DRAPE U-SHAPE 47X51 STRL (DRAPES) ×2 IMPLANT
DRSG AQUACEL AG ADV 3.5X10 (GAUZE/BANDAGES/DRESSINGS) ×2 IMPLANT
DRSG TEGADERM 8X12 (GAUZE/BANDAGES/DRESSINGS) ×2 IMPLANT
DURAPREP 26ML APPLICATOR (WOUND CARE) ×2 IMPLANT
ELECT BLADE TIP CTD 4 INCH (ELECTRODE) ×2 IMPLANT
ELECT PENCIL ROCKER SW 15FT (MISCELLANEOUS) ×2 IMPLANT
ELECT REM PT RETURN 15FT ADLT (MISCELLANEOUS) ×2 IMPLANT
FACESHIELD WRAPAROUND (MASK) ×3 IMPLANT
FACESHIELD WRAPAROUND OR TEAM (MASK) ×6 IMPLANT
GLOVE BIO SURGEON STRL SZ7 (GLOVE) ×2 IMPLANT
GLOVE BIO SURGEON STRL SZ7.5 (GLOVE) ×2 IMPLANT
GLOVE BIO SURGEON STRL SZ8 (GLOVE) ×2 IMPLANT
GLOVE BIOGEL PI IND STRL 7.0 (GLOVE) ×2 IMPLANT
GLOVE BIOGEL PI IND STRL 8 (GLOVE) ×2 IMPLANT
GOWN STRL SURGICAL XL XLNG (GOWN DISPOSABLE) ×4 IMPLANT
IMPL PROX BODY PTC 15X132 (Stem) IMPLANT
IMPLANT PROX BODY PTC 15X132 (Stem) ×1 IMPLANT
INSERT FLEX SHLD REV 39 +9 (Insert) IMPLANT
INSERT REV KIT SHOULDER 6X39 (Screw) IMPLANT
KIT BASIN OR (CUSTOM PROCEDURE TRAY) ×2 IMPLANT
KIT TURNOVER KIT A (KITS) IMPLANT
MANIFOLD NEPTUNE II (INSTRUMENTS) ×2 IMPLANT
NDL TAPERED W/ NITINOL LOOP (MISCELLANEOUS) IMPLANT
NEEDLE TAPERED W/ NITINOL LOOP (MISCELLANEOUS) IMPLANT
NS IRRIG 1000ML POUR BTL (IV SOLUTION) ×2 IMPLANT
PACK SHOULDER (CUSTOM PROCEDURE TRAY) ×2 IMPLANT
PAD ARMBOARD 7.5X6 YLW CONV (MISCELLANEOUS) ×4 IMPLANT
PROTECTOR NERVE ULNAR (MISCELLANEOUS) ×2 IMPLANT
RESTRAINT HEAD UNIVERSAL NS (MISCELLANEOUS) ×2 IMPLANT
SCREW ASSEMBLY COCR TYPE 0 (Screw) IMPLANT
SLING ARM FOAM STRAP LRG (SOFTGOODS) IMPLANT
SLING ARM FOAM STRAP MED (SOFTGOODS) IMPLANT
SMARTMIX MINI TOWER (MISCELLANEOUS)
SPONGE T-LAP 4X18 ~~LOC~~+RFID (SPONGE) IMPLANT
STEM HUM PTC DISTAL 13X130 (Miscellaneous) IMPLANT
SUCTION FRAZIER HANDLE 12FR (TUBING) ×1
SUCTION TUBE FRAZIER 12FR DISP (TUBING) ×2 IMPLANT
SUT MNCRL AB 3-0 PS2 18 (SUTURE) ×2 IMPLANT
SUT MON AB 2-0 CT1 36 (SUTURE) ×2 IMPLANT
SUT VIC AB 1 CT1 36 (SUTURE) ×2 IMPLANT
SUT VIC AB 2-0 CT1 27 (SUTURE) ×1
SUT VIC AB 2-0 CT1 TAPERPNT 27 (SUTURE) ×2 IMPLANT
SUTURE TAPE 1.3 40 TPR END (SUTURE) IMPLANT
SUTURETAPE 1.3 40 TPR END (SUTURE)
SWAB COLLECTION DEVICE MRSA (MISCELLANEOUS) IMPLANT
SWAB CULTURE ESWAB REG 1ML (MISCELLANEOUS) IMPLANT
TOWEL OR 17X26 10 PK STRL BLUE (TOWEL DISPOSABLE) ×2 IMPLANT
TOWEL OR NON WOVEN STRL DISP B (DISPOSABLE) ×2 IMPLANT
TOWER SMARTMIX MINI (MISCELLANEOUS) IMPLANT
WATER STERILE IRR 1000ML POUR (IV SOLUTION) ×2 IMPLANT
YANKAUER SUCT BULB TIP 10FT TU (MISCELLANEOUS) ×2 IMPLANT

## 2022-05-09 NOTE — Anesthesia Preprocedure Evaluation (Addendum)
Anesthesia Evaluation  Patient identified by MRN, date of birth, ID band Patient awake    Reviewed: Allergy & Precautions, NPO status , Patient's Chart, lab work & pertinent test results  History of Anesthesia Complications Negative for: history of anesthetic complications  Airway Mallampati: III  TM Distance: >3 FB Neck ROM: Full   Comment: Previous grade I view with MAC 4, easy mask Dental  (+) Upper Dentures,    Pulmonary neg shortness of breath, asthma , sleep apnea and Continuous Positive Airway Pressure Ventilation , neg COPD, neg recent URI   Pulmonary exam normal breath sounds clear to auscultation       Cardiovascular hypertension, (-) angina (-) Past MI, (-) Cardiac Stents and (-) CABG + dysrhythmias (1st degree AV block, RBBB)  Rhythm:Regular Rate:Normal     Neuro/Psych  PSYCHIATRIC DISORDERS  Depression    negative neurological ROS     GI/Hepatic negative GI ROS, Neg liver ROS,,,  Endo/Other  negative endocrine ROS    Renal/GU negative Renal ROS     Musculoskeletal  (+) Arthritis ,    Abdominal  (+) + obese  Peds  Hematology negative hematology ROS (+)   Anesthesia Other Findings Full facial hair  Reproductive/Obstetrics                              Anesthesia Physical Anesthesia Plan  ASA: 2  Anesthesia Plan: General   Post-op Pain Management: Regional block* and Tylenol PO (pre-op)*   Induction: Intravenous  PONV Risk Score and Plan: 3 and Ondansetron, Dexamethasone, Midazolam and Treatment may vary due to age or medical condition  Airway Management Planned: Oral ETT  Additional Equipment: None  Intra-op Plan:   Post-operative Plan: Extubation in OR  Informed Consent: I have reviewed the patients History and Physical, chart, labs and discussed the procedure including the risks, benefits and alternatives for the proposed anesthesia with the patient or authorized  representative who has indicated his/her understanding and acceptance.     Dental advisory given  Plan Discussed with: Anesthesiologist and CRNA  Anesthesia Plan Comments: (Discussed potential risks of nerve blocks including, but not limited to, infection, bleeding, nerve damage, seizures, pneumothorax, respiratory depression, and potential failure of the block. Alternatives to nerve blocks discussed. All questions answered.  Risks of general anesthesia discussed including, but not limited to, sore throat, hoarse voice, chipped/damaged teeth, injury to vocal cords, nausea and vomiting, allergic reactions, lung infection, heart attack, stroke, and death. All questions answered. )        Anesthesia Quick Evaluation

## 2022-05-09 NOTE — Discharge Instructions (Signed)
 Kevin M. Supple, M.D., F.A.A.O.S. Orthopaedic Surgery Specializing in Arthroscopic and Reconstructive Surgery of the Shoulder [phone] [address] - [place], [place] [address] - Fax [phone]   POST-OP TOTAL SHOULDER REPLACEMENT INSTRUCTIONS  1. Follow up in the office for your first post-op appointment 10-14 days from the date of your surgery. If you do not already have a scheduled appointment, our office will contact you to schedule.  2. The bandage over your incision is waterproof. You may begin showering with this dressing on. You may leave this dressing on until first follow up appointment within 2 weeks. We prefer you leave this dressing in place until follow up however after 5-7 days if you are having itching or skin irritation and would like to remove it you may do so. Go slow and tug at the borders gently to break the bond the dressing has with the skin. At this point if there is no drainage it is okay to go without a bandage or you may cover it with a light guaze and tape. You can also expect significant bruising around your shoulder that will drift down your arm and into your chest wall. This is very normal and should resolve over several days.   3. Wear your sling/immobilizer at all times except to perform the exercises below or to occasionally let your arm dangle by your side to stretch your elbow. You also need to sleep in your sling immobilizer until instructed otherwise. It is ok to remove your sling if you are sitting in a controlled environment and allow your arm to rest in a position of comfort by your side or on your lap with pillows to give your neck and skin a break from the sling. You may remove it to allow arm to dangle by side to shower. If you are up walking around and when you go to sleep at night you need to wear it.  4. Range of motion to your elbow, wrist, and hand are encouraged 3-5 times daily. Exercise to your hand and fingers helps to reduce  swelling you may experience.   5. Prescriptions for a pain medication and a muscle relaxant are provided for you. It is recommended that if you are experiencing pain that you pain medication alone is not controlling, add the muscle relaxant along with the pain medication which can give additional pain relief. The first 1-2 days is generally the most severe of your pain and then should gradually decrease. As your pain lessens it is recommended that you decrease your use of the pain medications to an "as needed basis'" only and to always comply with the recommended dosages of the pain medications.  6. Pain medications can produce constipation along with their use. If you experience this, the use of an over the counter stool softener or laxative daily is recommended.   7. For additional questions or concerns, please do not hesitate to call the office. If after hours there is an answering service to forward your concerns to the physician on call.  8.Pain control following an exparel block  To help control your post-operative pain you received a nerve block  performed with Exparel which is a long acting anesthetic (numbing agent) which can provide pain relief and sensations of numbness (and relief of pain) in the operative shoulder and arm for up to 3 days. Sometimes it provides mixed relief, meaning you may still have numbness in certain areas of the arm but can still be able to   move  parts of that arm, hand, and fingers. We recommend that your prescribed pain medications  be used as needed. We do not feel it is necessary to "pre medicate" and "stay ahead" of pain.  Taking narcotic pain medications when you are not having any pain can lead to unnecessary and potentially dangerous side effects.    9. Use the ice machine as much as possible in the first 5-7 days from surgery, then you can wean its use to as needed. The ice typically needs to be replaced every 6 hours, instead of ice you can actually freeze  water bottles to put in the cooler and then fill water around them to avoid having to purchase ice. You can have spare water bottles freezing to allow you to rotate them once they have melted. Try to have a thin shirt or light cloth or towel under the ice wrap to protect your skin.   FOR ADDITIONAL INFO ON ICE MACHINE AND INSTRUCTIONS GO TO THE WEBSITE AT  [email or web address]  10.  We recommend that you avoid any dental work or cleaning in the first 3 months following your joint replacement. This is to help minimize the possibility of infection from the bacteria in your mouth that enters your bloodstream during dental work. We also recommend that you take an antibiotic prior to your dental work for the first year after your shoulder replacement to further help reduce that risk. Please simply contact our office for antibiotics to be sent to your pharmacy prior to dental work.  11. Dental Antibiotics:  We recommend waiting at least 3 months for any dental work even cleanings unless there is a IT consultant. We also recommend  prophylactic antibiotics for all dental procdeures  the first year following your joint replacement. In some exceptions we recommend them to be used lifelong. We will provide you with that prescription in follow up office visits, or you can call our office.  Exceptions are as follows:  1. History of prior total joint infection  2. Severely immunocompromised (Organ Transplant, cancer chemotherapy, Rheumatoid biologic meds such as [place])  3. Poorly controlled diabetes (A1C &gt; 8.0, blood glucose over 200)   POST-OP EXERCISES  Can allow arm to dangle but no exercises yet. OK to move elbow wrist and hand

## 2022-05-09 NOTE — Anesthesia Procedure Notes (Addendum)
Anesthesia Regional Block: Interscalene brachial plexus block   Pre-Anesthetic Checklist: , timeout performed,  Correct Patient, Correct Site, Correct Laterality,  Correct Procedure, Correct Position, site marked,  Risks and benefits discussed,  Surgical consent,  Pre-op evaluation,  At surgeon's request and post-op pain management  Laterality: Right  Prep: chloraprep       Needles:  Injection technique: Single-shot  Needle Type: Echogenic Stimulator Needle     Needle Length: 9cm  Needle Gauge: 21     Additional Needles:   Procedures:,,,, ultrasound used (permanent image in chart),,    Narrative:  Start time: 05/09/2022 2:05 PM End time: 05/09/2022 2:08 PM Injection made incrementally with aspirations every 5 mL.  Performed by: Personally  Anesthesiologist: Nilda Simmer, MD  Additional Notes: Preop: Patient with numbness in his right little finger and ring finger.  Discussed risks and benefits of nerve block including, but not limited to, prolonged and/or permanent nerve injury involving sensory and/or motor function. Monitors were applied and a time-out was performed. The nerve and associated structures were visualized under ultrasound guidance. After negative aspiration, local anesthetic was slowly injected around the nerve. There was no evidence of high pressure during the procedure. There were no paresthesias. VSS remained stable and the patient tolerated the procedure well.

## 2022-05-09 NOTE — Anesthesia Procedure Notes (Signed)
Procedure Name: Intubation Date/Time: 05/09/2022 2:50 PM  Performed by: Renato Shin, CRNAPre-anesthesia Checklist: Patient identified, Emergency Drugs available, Suction available and Patient being monitored Patient Re-evaluated:Patient Re-evaluated prior to induction Oxygen Delivery Method: Circle system utilized Preoxygenation: Pre-oxygenation with 100% oxygen Induction Type: Rapid sequence Ventilation: Mask ventilation without difficulty Laryngoscope Size: Miller and 3 Grade View: Grade I Tube type: Oral Tube size: 7.5 mm Number of attempts: 1 Airway Equipment and Method: Stylet and Oral airway Placement Confirmation: ETT inserted through vocal cords under direct vision, positive ETCO2 and breath sounds checked- equal and bilateral Secured at: 22 cm Tube secured with: Tape Dental Injury: Teeth and Oropharynx as per pre-operative assessment

## 2022-05-09 NOTE — Transfer of Care (Signed)
Immediate Anesthesia Transfer of Care Note  Patient: [name]  Procedure(s) Performed: Revision Right Reverse Shoulder Arthroplasty (Right: Shoulder)  Patient Location: PACU  Anesthesia Type:General  Level of Consciousness: awake, alert , oriented, and patient cooperative  Airway & Oxygen Therapy: Patient Spontanous Breathing and Patient connected to face mask oxygen  Post-op Assessment: Report given to RN, Post -op Vital signs reviewed and stable, and Patient moving all extremities X 4  Post vital signs: Reviewed and stable  Last Vitals:  Vitals Value Taken Time  BP 127/67 05/09/22 1730  Temp    Pulse 74 05/09/22 1731  Resp 27 05/09/22 1731  SpO2 94 % 05/09/22 1731  Vitals shown include unvalidated device data.  Last Pain:  Vitals:   05/09/22 1424  TempSrc:   PainSc: 0-No pain         Complications: No notable events documented.

## 2022-05-09 NOTE — Op Note (Signed)
05/09/2022  4:57 PM  PATIENT:   [name]  72 y.o. male  PRE-OPERATIVE DIAGNOSIS:  unstable right reverse shoulder arthroplasty  POST-OPERATIVE DIAGNOSIS: Same secondary to humeral stem subsidence  PROCEDURE: Right shoulder revision reverse arthroplasty with removal of a previously placed Arthrex longstem humeral prosthesis and the placement of a Tornier revive modular stem size 13 distal segment, size 15 metaphysis, +12 humeral tray, +9 retentive poly insert.  SURGEON:  Marin Shutter M.D.  ASSISTANTS: Jenetta Loges, PA-C  Jenetta Loges, PA-C was utilized as an Environmental consultant throughout this case, essential for help with positioning the patient, positioning extremity, tissue manipulation, implantation of the prosthesis, suture management, wound closure, and intraoperative decision-making.  ANESTHESIA:   General endotracheal and interscalene block with Exparel  EBL: 200 cc  SPECIMEN: None  Drains: None   PATIENT DISPOSITION:  PACU - hemodynamically stable.    PLAN OF CARE: Admit for overnight observation  Brief history:  [name] is a 72 year old gentleman who has been followed for chronic right shoulder difficulties related to arthritis with previous anatomic arthroplasty which unfortunately went on to instability and failure of the implant and he underwent a revision to reverse arthroplasty approximately 8 weeks ago.  At his first 2 follow-up visits in the office he been doing relatively well but this past week he developed an instability episode which has become recurrent and in the office this past Wednesday he presented with a dislocated implant confirmed radiographically.  Due to his recurrent instability he is now brought to the operating room for planned revision arthroplasty.  Of note at the time of his initial revision surgery he was found to have significant metaphyseal fractures which had been treated with cerclage fixation and appears as though his stem subsequently  subsided now creating instability.  Preoperatively, I counseled the patient regarding treatment options and risks versus benefits thereof.  Possible surgical complications were all reviewed including potential for bleeding, infection, neurovascular injury, persistent pain, loss of motion, anesthetic complication, failure of the implant, and possible need for additional surgery. They understand and accept and agrees with our planned procedure.   Procedure detail:  After undergoing routine preop evaluation the patient received prophylactic antibiotics and interscalene block with Exparel was established in the holding area by the anesthesia department.  Subsequently placed spine on the operating table and underwent the smooth induction of general endotracheal anesthesia.  Placed in the beachchair position and appropriately padded and protected.  The right shoulder girdle region was sterilely prepped and draped in standard fashion.  Timeout was called.  Approach to the right shoulder was made through his previous anterior incision along the deltopectoral interval total length approximate 12 cm.  Skin flaps were elevated dissection carried deeply and this allowed [place] to identify the previous deltopectoral split which we reopen using electrocautery.  The conjoined tendon was then identified and retracted medially and we dissected down through the capsular tissues gaining access into the shoulder joint where a large seroma/hematoma was evacuated but this was all clear viscous fluid as would be expected.  Dissection was then carried in a subperiosteal fashion around the proximal humerus both medially and laterally and freeing up the tuberosity and allowing the implant to easily be dislocated.  We then removed the previously placed poly and trimmed away the cerclage suture tape that had been used to create a repair of the metaphyseal fracture on the implant and the implant was then removed without difficulty and was  sterilely grossly loose.  The  canal was then curetted and irrigated and cleaned.  We then utilized the Dubois revive system with hand reaming up to a size 13 giving [place] an excellent diaphyseal fit.  We then performed multiple trial reductions with various length spacers and ultimately decided upon the combination of a 13 distal stem and a 15 metaphysis that gave [place] a total length of 190 mm.  This final construct was then assembled and the implant was terminally tightened.  It was then impacted after the humeral canal was irrigated cleaned and dried and then vancomycin powder was sprayed liberally throughout the canal.  The implant was then seated at 20 degrees of retroversion achieving excellent fixation.  We then performed some provisional reductions and identified that a +12 humeral tray would be appropriate and this was then impacted onto the [facility] taper.  We then initially tried a standard +6 poly but did not have appropriate soft tissue and so ultimately went to a +9 constrained polyethylene insert which we impacted and performed final reduction which showed excellent motion stability and soft tissue balance.  At this point intraoperative fluoroscopic images were then used to confirm that the overall alignment and position of the implant and quality of the bone was to our satisfaction.  The wound was then copiously irrigated.  Final hemostasis was obtained.  Vancomycin powder was then sprayed liberally throughout the the deep soft tissue planes.  The deltopectoral interval was then reapproximated with a series of figure-of-eight number Vicryl sutures.  2-0 Monocryl used to close the subcu layer and intracuticular 3-0 Monocryl used to close the skin followed by Dermabond and Aquacel dressing.  The right arm was placed into a sling.  The patient was then awakened, extubated, and taken to the recovery room in stable condition.  Marin Shutter MD   Contact # [phone]

## 2022-05-09 NOTE — H&P (Signed)
[name]    Chief Complaint: unstable right reverse shoulder arthroplasty HPI: The patient is a 72 y.o. male well-known to our practice after previous right shoulder anatomic arthroplasty which she has gone on to clinical failure, recently status post revision surgery 03/14/2022 where we removed the anatomic arthroplasty and performed a reverse shoulder arthroplasty.  Postoperatively he had been doing well until this past week at which time he developed an instability episode with subsequent spontaneous reduction.  He had several more episodes.  Evaluation in the office shows overall alignment of the implants to be appropriate but clearly he has developed laxity allowing recurrent instability and at this time he is brought back to the operating room for planned revision of the humeral stem.  Past Medical History:  Diagnosis Date   Allergy    Arthritis    knees   Asthma    Depression    Dysrhythmia    RBBB   Elevated PSA    [place] and bx negative 2011   Hypertension    nl cardiolite in 1998   Pneumonia 2005   right hospitalized    Rhinosinusitis    Sleep apnea    wears CPAP      Past Surgical History:  Procedure Laterality Date   COLONOSCOPY  03/09/2013   ECTROPION REPAIR Left    2023   EYE SURGERY Left 04/08/2022   to bring lower eyelid up   [place] TOTAL KNEE  01/2010   left; Dr Alvan Dame; RIght knee 06-2010   REVERSE SHOULDER ARTHROPLASTY Right 03/14/2022   Procedure: REVERSE SHOULDER ARTHROPLASTY WITH REMOVAL OF ANATOMIC ARTHROPLASTY;  Surgeon: [name], MD;  Location: [facility];  Service: Orthopedics;  Laterality: Right;   right shoulder arthroplasty     2018   shoulder atrhroscopic surgery  2011   Right   TOTAL KNEE REVISION Left 12/05/2021   Procedure: TOTAL KNEE REVISION;  Surgeon: Paralee Cancel, MD;  Location: [facility];  Service: Orthopedics;  Laterality: Left;   VENTRAL HERNIA  REPAIR  03/2007   rerepair winter [id number]   umbilical per pt    Family History  Problem Relation Age of Onset   Heart failure Father        MV replacement   Myelodysplastic syndrome Father    Asthma Mother    Polycythemia Brother    Asthma Other    Hypertension Other    Diabetes Maternal Grandmother    Healthy Sister    Healthy Brother    Healthy Brother    Colon cancer Neg Hx    Pancreatic cancer Neg Hx    Stomach cancer Neg Hx    Esophageal cancer Neg Hx    Colon polyps Neg Hx    Rectal cancer Neg Hx     Social History:  reports that he has never smoked. He has never used smokeless tobacco. He reports that he does not drink alcohol and does not use drugs.  BMI: Estimated body mass index is 36.85 kg/m as calculated from the following:   Height as of this encounter: 5' 3"$  (1.6 m).   Weight as of this encounter: 94.3 kg.  Lab Results  Component Value Date   ALBUMIN 4.6 11/06/2021   Diabetes: Patient does not have a diagnosis of diabetes. Lab Results  Component Value Date   HGBA1C 5.5 11/06/2021     Smoking Status:  reports that he has never smoked. He has never used smokeless tobacco.     Medications Prior to Admission  Medication Sig Dispense Refill   amLODipine-benazepril (LOTREL) 5-10 MG capsule TAKE 1 CAPSULE BY MOUTH EVERY DAY (Patient taking differently: Take 1 capsule by mouth daily.) 90 capsule 2   Artificial Tear Solution (SOOTHE XP) SOLN Place 1 drop into both eyes 3 (three) times daily as needed (for dryness).     Azelastine HCl 137 MCG/SPRAY SOLN PLACE 1-2 SPRAYS INTO BOTH NOSTRILS 2 (TWO) TIMES DAILY AS NEEDED FOR RHINITIS. (Patient taking differently: Place 1-2 sprays into both nostrils 2 (two) times daily as needed (for rhinitis).) 137 mL 12   clobetasol (OLUX) 0.05 % topical foam Apply 1 application  topically daily as needed (scalp itching).     Cyanocobalamin (B-12) 1000 MCG SUBL Place 1,000 mcg under the tongue daily.     escitalopram (LEXAPRO)  10 MG tablet TAKE 1 TABLET BY MOUTH EVERY DAY (Patient taking differently: Take 10 mg by mouth daily.) 90 tablet 2   fexofenadine (ALLEGRA) 180 MG tablet Take 180 mg by mouth daily.     fluticasone (FLONASE) 50 MCG/ACT nasal spray SPRAY 2 SPRAYS INTO EACH NOSTRIL EVERY DAY (Patient taking differently: Place 2 sprays into both nostrils daily.) 16 mL 12   Ipratropium-Albuterol (COMBIVENT RESPIMAT) 20-100 MCG/ACT AERS respimat INHALE 2 PUFFS UP TO FOUR TIMES A DAY AS NEEDED (Patient taking differently: Inhale 2 puffs into the lungs 4 (four) times daily as needed for shortness of breath or wheezing.) 4 g 11   PRESCRIPTION MEDICATION CPAP- At bedtime and during any time of rest     silodosin (RAPAFLO) 8 MG CAPS capsule Take 1 capsule (8 mg total) by mouth at bedtime. 30 capsule 11   triamcinolone cream (KENALOG) 0.1 % Apply 1 application  topically daily as needed (to affected areas- for itching).     WIXELA INHUB 100-50 MCG/ACT AEPB TAKE 1 PUFF BY MOUTH TWICE A DAY (Patient taking differently: Inhale 1 puff into the lungs 2 (two) times daily.) 60 each 6   cyclobenzaprine (FLEXERIL) 10 MG tablet Take 1 tablet (10 mg total) by mouth 3 (three) times daily as needed for muscle spasms. (Patient not taking: Reported on 05/07/2022) 30 tablet 1   ibuprofen (ADVIL) 800 MG tablet Take 1 tablet (800 mg total) by mouth every 8 (eight) hours as needed. (Patient not taking: Reported on 05/07/2022) 90 tablet 2   ondansetron (ZOFRAN) 4 MG tablet Take 1 tablet (4 mg total) by mouth every 8 (eight) hours as needed for nausea or vomiting. (Patient not taking: Reported on 05/07/2022) 10 tablet 0   oxyCODONE-acetaminophen (PERCOCET) 5-325 MG tablet Take 1 tablet by mouth every 4 (four) hours as needed (max 6 q). (Patient not taking: Reported on 05/07/2022) 20 tablet 0     Physical Exam: Inspection of the right shoulder demonstrates that his previous surgical incisions healing nicely although there is a broad area of erythema and  resolving ecchymosis extending distally from the incision to the anterior aspect of the upper arm.  He is otherwise neurovascular intact in the right upper extremity with soft compartments.  Plain radiographs confirm that overall the alignment is in proper position although the most recent films are office demonstrated that the shoulder was anteriorly dislocated but then he had a spontaneous reduction.  Vitals  Temp:  [98.2 F (36.8 C)] 98.2 F (36.8 C) (02/09 1314) Pulse Rate:  [63-80] 65 (02/09 1424) Resp:  [14-28] 24 (  02/09 1424) BP: (131-143)/(70-87) 141/70 (02/09 1424) SpO2:  [98 %-100 %] 99 % (02/09 1424) Weight:  [94.3 kg] 94.3 kg (02/09 1318)  Assessment/Plan  Impression: unstable right reverse shoulder arthroplasty  Plan of Action: Procedure(s): Revision Right Reverse Shoulder Arthroplasty  Marybella Ethier M Rishawn Walck 05/09/2022, 2:33 PM Contact # [phone]

## 2022-05-10 ENCOUNTER — Other Ambulatory Visit: Payer: Self-pay

## 2022-05-10 NOTE — Discharge Summary (Signed)
In most cases prophylactic antibiotics for Dental procdeures after total joint surgery are not necessary.  Exceptions are as follows:  1. History of prior total joint infection  2. Severely immunocompromised (Organ Transplant, cancer chemotherapy, Rheumatoid biologic meds such as [place])  3. Poorly controlled diabetes (A1C &gt; 8.0, blood glucose over 200)  If you have one of these conditions, contact your surgeon for an antibiotic prescription, prior to your dental procedure. Orthopedic Discharge Summary        Physician Discharge Summary  Patient ID: [name] MRN: [id number] DOB/AGE: [birth year] 72 y.o.  Admit date: 05/09/2022 Discharge date: 05/10/2022   Procedures:  Procedure(s) (LRB): Revision Right Reverse Shoulder Arthroplasty (Right)  Attending Physician:  Dr. Onnie Graham  Admission Diagnoses:   right reverse shoulder dislocation  Discharge Diagnoses:  right reverse shoulder dislocation   Past Medical History:  Diagnosis Date   Allergy    Arthritis    knees   Asthma    Depression    Dysrhythmia    RBBB   Elevated PSA    [place] and bx negative 2011   Hypertension    nl cardiolite in 1998   Pneumonia 2005   right hospitalized    Rhinosinusitis    Sleep apnea    wears CPAP    PCP: Burnis Medin, MD   Discharged Condition: good  Hospital Course:  Patient underwent the above stated procedure on 05/09/2022. Patient tolerated the procedure well and brought to the recovery room in good condition and subsequently to the floor. Patient had an uncomplicated hospital course and was stable for discharge.   Disposition: Discharge disposition: 01-Home or Self Care      with follow up in 2 weeks    Follow-up Information     [name], MD Follow up.   Specialty: Orthopedic Surgery Why: 05-19-2022 at 10:15 AM for -post-op Contact information: [address] [place] [address] [id number]                 Dental  Antibiotics:  In most cases prophylactic antibiotics for Dental procdeures after total joint surgery are not necessary.  Exceptions are as follows:  1. History of prior total joint infection  2. Severely immunocompromised (Organ Transplant, cancer chemotherapy, Rheumatoid biologic meds such as [place])  3. Poorly controlled diabetes (A1C &gt; 8.0, blood glucose over 200)  If you have one of these conditions, contact your surgeon for an antibiotic prescription, prior to your dental procedure.  Discharge Instructions     Call MD / Call 911   Complete by: As directed    If you experience chest pain or shortness of breath, CALL 911 and be transported to the hospital emergency room.  If you develope a fever above 101 F, pus (white drainage) or increased drainage or redness at the wound, or calf pain, call your surgeon's office.   Call MD / Call 911   Complete by: As directed    If you experience chest pain or shortness of breath, CALL 911 and be transported to the hospital emergency room.  If you develope a fever above 101 F, pus (white drainage) or increased drainage or redness at the wound, or calf pain, call your surgeon's office.   Constipation Prevention   Complete by: As directed    Drink plenty of fluids.  Prune juice may be helpful.  You may use a stool softener, such as Colace (over the counter) 100 mg twice a day.  Use MiraLax (over  the counter) for constipation as needed.   Constipation Prevention   Complete by: As directed    Drink plenty of fluids.  Prune juice may be helpful.  You may use a stool softener, such as Colace (over the counter) 100 mg twice a day.  Use MiraLax (over the counter) for constipation as needed.   Diet - low sodium heart healthy   Complete by: As directed    Diet - low sodium heart healthy   Complete by: As directed    Increase activity slowly as tolerated   Complete by: As directed    Increase activity slowly as tolerated   Complete by: As  directed    Post-operative opioid taper instructions:   Complete by: As directed    POST-OPERATIVE OPIOID TAPER INSTRUCTIONS: It is important to wean off of your opioid medication as soon as possible. If you do not need pain medication after your surgery it is ok to stop day one. Opioids include: Codeine, Hydrocodone(Norco, Vicodin), Oxycodone(Percocet, oxycontin) and hydromorphone amongst others.  Long term and even short term use of opiods can cause: Increased pain response Dependence Constipation Depression Respiratory depression And more.  Withdrawal symptoms can include Flu like symptoms Nausea, vomiting And more Techniques to manage these symptoms Hydrate well Eat regular healthy meals Stay active Use relaxation techniques(deep breathing, meditating, yoga) Do Not substitute Alcohol to help with tapering If you have been on opioids for less than two weeks and do not have pain than it is ok to stop all together.  Plan to wean off of opioids This plan should start within one week post op of your joint replacement. Maintain the same interval or time between taking each dose and first decrease the dose.  Cut the total daily intake of opioids by one tablet each day Next start to increase the time between doses. The last dose that should be eliminated is the evening dose.      Post-operative opioid taper instructions:   Complete by: As directed    POST-OPERATIVE OPIOID TAPER INSTRUCTIONS: It is important to wean off of your opioid medication as soon as possible. If you do not need pain medication after your surgery it is ok to stop day one. Opioids include: Codeine, Hydrocodone(Norco, Vicodin), Oxycodone(Percocet, oxycontin) and hydromorphone amongst others.  Long term and even short term use of opiods can cause: Increased pain response Dependence Constipation Depression Respiratory depression And more.  Withdrawal symptoms can include Flu like symptoms Nausea,  vomiting And more Techniques to manage these symptoms Hydrate well Eat regular healthy meals Stay active Use relaxation techniques(deep breathing, meditating, yoga) Do Not substitute Alcohol to help with tapering If you have been on opioids for less than two weeks and do not have pain than it is ok to stop all together.  Plan to wean off of opioids This plan should start within one week post op of your joint replacement. Maintain the same interval or time between taking each dose and first decrease the dose.  Cut the total daily intake of opioids by one tablet each day Next start to increase the time between doses. The last dose that should be eliminated is the evening dose.          Allergies as of 05/10/2022       Reactions   Tape Other (See Comments)   Causes bleeding and redness   Eggs Or Egg-derived Products Diarrhea, Nausea Only, Other (See Comments)   Terrible stomach pain  Medication List     TAKE these medications    amLODipine-benazepril 5-10 MG capsule Commonly known as: LOTREL TAKE 1 CAPSULE BY MOUTH EVERY DAY What changed: how much to take   Azelastine HCl 137 MCG/SPRAY Soln PLACE 1-2 SPRAYS INTO BOTH NOSTRILS 2 (TWO) TIMES DAILY AS NEEDED FOR RHINITIS. What changed: See the new instructions.   B-12 1000 MCG Subl Place 1,000 mcg under the tongue daily.   clobetasol 0.05 % topical foam Commonly known as: OLUX Apply 1 application  topically daily as needed (scalp itching).   Combivent Respimat 20-100 MCG/ACT Aers respimat Generic drug: Ipratropium-Albuterol INHALE 2 PUFFS UP TO FOUR TIMES A DAY AS NEEDED What changed: See the new instructions.   cyclobenzaprine 10 MG tablet Commonly known as: FLEXERIL Take 1 tablet (10 mg total) by mouth 3 (three) times daily as needed for muscle spasms.   escitalopram 10 MG tablet Commonly known as: LEXAPRO TAKE 1 TABLET BY MOUTH EVERY DAY   fexofenadine 180 MG tablet Commonly known as: ALLEGRA Take  180 mg by mouth daily.   fluticasone 50 MCG/ACT nasal spray Commonly known as: FLONASE SPRAY 2 SPRAYS INTO EACH NOSTRIL EVERY DAY What changed: See the new instructions.   ibuprofen 800 MG tablet Commonly known as: ADVIL Take 1 tablet (800 mg total) by mouth every 8 (eight) hours as needed.   ondansetron 4 MG tablet Commonly known as: Zofran Take 1 tablet (4 mg total) by mouth every 8 (eight) hours as needed for nausea or vomiting.   oxyCODONE-acetaminophen 5-325 MG tablet Commonly known as: Percocet Take 1 tablet by mouth every 4 (four) hours as needed (max 6 q).   PRESCRIPTION MEDICATION CPAP- At bedtime and during any time of rest   silodosin 8 MG Caps capsule Commonly known as: Rapaflo Take 1 capsule (8 mg total) by mouth at bedtime.   Soothe XP Soln Place 1 drop into both eyes 3 (three) times daily as needed (for dryness).   triamcinolone cream 0.1 % Commonly known as: KENALOG Apply 1 application  topically daily as needed (to affected areas- for itching).   Wixela Inhub 100-50 MCG/ACT Aepb Generic drug: fluticasone-salmeterol TAKE 1 PUFF BY MOUTH TWICE A DAY What changed: See the new instructions.          Signed: Ventura Bruns 05/10/2022, 7:42 AM  [facility] is now [facility] [address], [place], [place],  [address] Phone: [place]

## 2022-05-10 NOTE — Progress Notes (Signed)
   Subjective: 1 Day Post-Op Procedure(s) (LRB): Revision Right Reverse Shoulder Arthroplasty (Right)  Pt sitting up comfortably in no pain/discomfort this morning Denies any new symptoms or issues overnight Block still working from surgery yesterday Otherwise doing well Patient reports pain as none.  Objective:   VITALS:   Vitals:   05/10/22 0143 05/10/22 0600  BP: 127/68 124/76  Pulse: 89 83  Resp: 17 16  Temp: 97.8 F (36.6 C) 98 F (36.7 C)  SpO2: 94% 97%    Right shoulder incision healing well No rashes or edema distally Nv intact distally  Sling in good position   LABS Recent Labs    05/07/22 1458  HGB 13.4  HCT 39.4  WBC 8.3  PLT 174    Recent Labs    05/07/22 1458  NA 136  K 4.5  BUN 12  CREATININE 0.66  GLUCOSE 96     Assessment/Plan: 1 Day Post-Op Procedure(s) (LRB): Revision Right Reverse Shoulder Arthroplasty (Right) Overall pt doing very well D/c home today F/u in the office in 2 weeks No lifting with the right upper extremity     Brad Luna [place], [place] is now [facility]  [facility] [facility] [address], [address], [place], [place] [address] Phone: [phone] [email or web address] Facebook  [employer]

## 2022-05-10 NOTE — Plan of Care (Signed)
?  Problem: Activity: ?Goal: Ability to tolerate increased activity will improve ?Outcome: Progressing ?  ?Problem: Education: ?Goal: Knowledge of General Education information will improve ?Description: Including pain rating scale, medication(s)/side effects and non-pharmacologic comfort measures ?Outcome: Progressing ?  ?Problem: Health Behavior/Discharge Planning: ?Goal: Ability to manage health-related needs will improve ?Outcome: Progressing ?  ?

## 2022-05-10 NOTE — Progress Notes (Signed)
The patient is alert and oriented and has been seen by his physician. The orders for discharge were written. IV has been removed. Went over discharge instructions with patient. He is being discharged home by [facility] with all of his belongings.

## 2022-05-12 ENCOUNTER — Encounter (HOSPITAL_COMMUNITY): Payer: Self-pay | Admitting: Orthopedic Surgery

## 2022-05-12 ENCOUNTER — Telehealth: Payer: Self-pay

## 2022-05-12 NOTE — Transitions of Care (Post Inpatient/ED Visit) (Signed)
   05/12/2022  Name: [name] MRN: [id number] DOB: October 11, 1950  Today's TOC FU Call Status: Today's TOC FU Call Status:: Successful TOC FU Call Competed TOC FU Call Complete Date: 05/12/22  Transition Care Management Follow-up Telephone Call Date of Discharge: 05/10/22 Discharge Facility: [facility] Type of Discharge: Inpatient Admission Primary Inpatient Discharge Diagnosis:: "right reverse shoulder arthoplasty" How have you been since you were released from the hospital?: Better Any questions or concerns?: No  Items Reviewed: Did you receive and understand the discharge instructions provided?: Yes Medications obtained and verified?: Yes (Medications Reviewed) Any new allergies since your discharge?: No Dietary orders reviewed?: Yes Type of Diet Ordered:: low salt/heart healthy Do you have support at home?: No (pt lives alonr but voices he has family/friends he can call on if needed)  Home Care and Equipment/Supplies: [place] Ordered?: No Any new equipment or medical supplies ordered?: No  Functional Questionnaire: Do you need assistance with bathing/showering or dressing?: No Do you need assistance with meal preparation?: No Do you need assistance with eating?: No Do you have difficulty maintaining continence: No Do you need assistance with getting out of bed/getting out of a chair/moving?: No Do you have difficulty managing or taking your medications?: No  Folllow up appointments reviewed: PCP Follow-up appointment confirmed?: NA Specialist Hospital Follow-up appointment confirmed?: Yes Date of Specialist follow-up appointment?: 05/19/22 Follow-Up Specialty Provider:: Dr. Onnie Graham Do you need transportation to your follow-up appointment?: No Do you understand care options if your condition(s) worsen?: Yes-patient verbalized understanding  SDOH Interventions Today    Flowsheet Row Most Recent Value  SDOH Interventions   Food Insecurity Interventions  Intervention Not Indicated  Transportation Interventions Intervention Not Indicated        TOC Interventions Today    Flowsheet Row Most Recent Value  TOC Interventions   TOC Interventions Discussed/Reviewed TOC Interventions Discussed, Post discharge activity limitations per provider, Post op wound/incision care, S/S of infection       Interventions Today    Flowsheet Row Most Recent Value  Education Interventions   Education Provided Provided Verbal Education  [pain mgmt, bowel regimen]  Provided Verbal Education On When to see the doctor, Medication, Nutrition  Nutrition Interventions   Nutrition Discussed/Reviewed Nutrition Discussed, Decreasing salt  Safety Interventions   Safety Discussed/Reviewed Safety Discussed       Enzo Montgomery, RN,BSN,CCM [place] [facility] Telephonic Care Management Coordinator Direct Phone: [phone] Toll Free: [phone] Fax: [phone]

## 2022-05-13 ENCOUNTER — Encounter (HOSPITAL_COMMUNITY): Payer: Self-pay | Admitting: Orthopedic Surgery

## 2022-05-13 NOTE — Anesthesia Postprocedure Evaluation (Signed)
Anesthesia Post Note  Patient: [name]  Procedure(s) Performed: Revision Right Reverse Shoulder Arthroplasty (Right: Shoulder)     Patient location during evaluation: PACU Anesthesia Type: Regional and General Level of consciousness: awake and alert Pain management: pain level controlled Vital Signs Assessment: post-procedure vital signs reviewed and stable Respiratory status: spontaneous breathing, nonlabored ventilation, respiratory function stable and patient connected to nasal cannula oxygen Cardiovascular status: blood pressure returned to baseline and stable Postop Assessment: no apparent nausea or vomiting Anesthetic complications: no   No notable events documented.  Last Vitals:  Vitals:   05/10/22 0814 05/10/22 0911  BP: (!) 145/86 118/70  Pulse: 84 92  Resp:  18  Temp:  36.4 C  SpO2:  100%    Last Pain:  Vitals:   05/10/22 0735  TempSrc:   PainSc: 0-No pain                 Tawnya Pujol S

## 2022-05-19 ENCOUNTER — Telehealth: Payer: Self-pay

## 2022-05-19 DIAGNOSIS — Z96611 Presence of right artificial shoulder joint: Secondary | ICD-10-CM | POA: Diagnosis not present

## 2022-05-19 DIAGNOSIS — Z471 Aftercare following joint replacement surgery: Secondary | ICD-10-CM | POA: Diagnosis not present

## 2022-05-19 NOTE — Telephone Encounter (Signed)
     Patient  visit on 2/6  at [facility] you been able to follow up with your primary care physician? Yes   The patient was or was not able to obtain any needed medicine or equipment. Yes    Are there diet recommendations that you are having difficulty following? Na   Patient expresses understanding of discharge instructions and education provided has no other needs at this time.  Yes      [place] [phone] [address], [place],  [address] Phone: [phone] Email: [email or web address]

## 2022-06-09 ENCOUNTER — Ambulatory Visit (INDEPENDENT_AMBULATORY_CARE_PROVIDER_SITE_OTHER): Payer: Medicare PPO

## 2022-06-09 VITALS — Ht 63.0 in | Wt 205.0 lb

## 2022-06-09 DIAGNOSIS — Z0000 Encounter for general adult medical examination without abnormal findings: Secondary | ICD-10-CM

## 2022-06-09 NOTE — Patient Instructions (Addendum)
[name] , Thank you for taking time to come for your Medicare Wellness Visit. I appreciate your ongoing commitment to your health goals. Please review the following plan we discussed and let me know if I can assist you in the future.   These are the goals we discussed:  Goals       Exercise 150 min/wk Moderate Activity (pt-stated)      Would like to work more on my cardio exercise and increase mobility.      Patient Stated      I would like to scuba dive more!       Patient stated (pt-stated)      I want to heal from shoulder surgery, and get back on my bike.        This is a list of the screening recommended for you and due dates:  Health Maintenance  Topic Date Due   DTaP/Tdap/Td vaccine (2 - Tdap) 09/18/2019   COVID-19 Vaccine (7 - 2023-24 season) 06/25/2022*   Colon Cancer Screening  06/24/2022   Medicare Annual Wellness Visit  06/09/2023   Pneumonia Vaccine  Completed   Flu Shot  Completed   Hepatitis C Screening: USPSTF Recommendation to screen - Ages 18-79 yo.  Completed   Zoster (Shingles) Vaccine  Completed   HPV Vaccine  Aged Out  *Topic was postponed. The date shown is not the original due date.    Advanced directives: Advance directive discussed with you today. Even though you declined this today, please call our office should you change your mind, and we can give you the proper paperwork for you to fill out.   Conditions/risks identified: None  Next appointment: Follow up in one year for your annual wellness visit.   Preventive Care 42 Years and Older, Male  Preventive care refers to lifestyle choices and visits with your health care provider that can promote health and wellness. What does preventive care include? A yearly physical exam. This is also called an annual well check. Dental exams once or twice a year. Routine eye exams. Ask your health care provider how often you should have your eyes checked. Personal lifestyle choices, including: Daily care  of your teeth and gums. Regular physical activity. Eating a healthy diet. Avoiding tobacco and drug use. Limiting alcohol use. Practicing safe sex. Taking low doses of aspirin every day. Taking vitamin and mineral supplements as recommended by your health care provider. What happens during an annual well check? The services and screenings done by your health care provider during your annual well check will depend on your age, overall health, lifestyle risk factors, and family history of disease. Counseling  Your health care provider may ask you questions about your: Alcohol use. Tobacco use. Drug use. Emotional well-being. Home and relationship well-being. Sexual activity. Eating habits. History of falls. Memory and ability to understand (cognition). Work and work Statistician. Screening  You may have the following tests or measurements: Height, weight, and BMI. Blood pressure. Lipid and cholesterol levels. These may be checked every 5 years, or more frequently if you are over 71 years old. Skin check. Lung cancer screening. You may have this screening every year starting at age 60 if you have a 30-pack-year history of smoking and currently smoke or have quit within the past 15 years. Fecal occult blood test (FOBT) of the stool. You may have this test every year starting at age 51. Flexible sigmoidoscopy or colonoscopy. You may have a sigmoidoscopy every 5 years or a colonoscopy  every 10 years starting at age 27. Prostate cancer screening. Recommendations will vary depending on your family history and other risks. Hepatitis C blood test. Hepatitis B blood test. Sexually transmitted disease (STD) testing. Diabetes screening. This is done by checking your blood sugar (glucose) after you have not eaten for a while (fasting). You may have this done every 1-3 years. Abdominal aortic aneurysm (AAA) screening. You may need this if you are a current or former smoker. Osteoporosis. You may  be screened starting at age 65 if you are at high risk. Talk with your health care provider about your test results, treatment options, and if necessary, the need for more tests. Vaccines  Your health care provider may recommend certain vaccines, such as: Influenza vaccine. This is recommended every year. Tetanus, diphtheria, and acellular pertussis (Tdap, Td) vaccine. You may need a Td booster every 10 years. Zoster vaccine. You may need this after age 71. Pneumococcal 13-valent conjugate (PCV13) vaccine. One dose is recommended after age 85. Pneumococcal polysaccharide (PPSV23) vaccine. One dose is recommended after age 32. Talk to your health care provider about which screenings and vaccines you need and how often you need them. This information is not intended to replace advice given to you by your health care provider. Make sure you discuss any questions you have with your health care provider. Document Released: 04/13/2015 Document Revised: 12/05/2015 Document Reviewed: 01/16/2015 Elsevier Interactive Patient Education  2017 [employer] Prevention in the Home Falls can cause injuries. They can happen to people of all ages. There are many things you can do to make your home safe and to help prevent falls. What can I do on the outside of my home? Regularly fix the edges of walkways and driveways and fix any cracks. Remove anything that might make you trip as you walk through a door, such as a raised step or threshold. Trim any bushes or trees on the path to your home. Use bright outdoor lighting. Clear any walking paths of anything that might make someone trip, such as rocks or tools. Regularly check to see if handrails are loose or broken. Make sure that both sides of any steps have handrails. Any raised decks and porches should have guardrails on the edges. Have any leaves, snow, or ice cleared regularly. Use sand or salt on walking paths during winter. Clean up any spills in  your garage right away. This includes oil or grease spills. What can I do in the bathroom? Use night lights. Install grab bars by the toilet and in the tub and shower. Do not use towel bars as grab bars. Use non-skid mats or decals in the tub or shower. If you need to sit down in the shower, use a plastic, non-slip stool. Keep the floor dry. Clean up any water that spills on the floor as soon as it happens. Remove soap buildup in the tub or shower regularly. Attach bath mats securely with double-sided non-slip rug tape. Do not have throw rugs and other things on the floor that can make you trip. What can I do in the bedroom? Use night lights. Make sure that you have a light by your bed that is easy to reach. Do not use any sheets or blankets that are too big for your bed. They should not hang down onto the floor. Have a firm chair that has side arms. You can use this for support while you get dressed. Do not have throw rugs and other things on  the floor that can make you trip. What can I do in the kitchen? Clean up any spills right away. Avoid walking on wet floors. Keep items that you use a lot in easy-to-reach places. If you need to reach something above you, use a strong step stool that has a grab bar. Keep electrical cords out of the way. Do not use floor polish or wax that makes floors slippery. If you must use wax, use non-skid floor wax. Do not have throw rugs and other things on the floor that can make you trip. What can I do with my stairs? Do not leave any items on the stairs. Make sure that there are handrails on both sides of the stairs and use them. Fix handrails that are broken or loose. Make sure that handrails are as long as the stairways. Check any carpeting to make sure that it is firmly attached to the stairs. Fix any carpet that is loose or worn. Avoid having throw rugs at the top or bottom of the stairs. If you do have throw rugs, attach them to the floor with carpet  tape. Make sure that you have a light switch at the top of the stairs and the bottom of the stairs. If you do not have them, ask someone to add them for you. What else can I do to help prevent falls? Wear shoes that: Do not have high heels. Have rubber bottoms. Are comfortable and fit you well. Are closed at the toe. Do not wear sandals. If you use a stepladder: Make sure that it is fully opened. Do not climb a closed stepladder. Make sure that both sides of the stepladder are locked into place. Ask someone to hold it for you, if possible. Clearly mark and make sure that you can see: Any grab bars or handrails. First and last steps. Where the edge of each step is. Use tools that help you move around (mobility aids) if they are needed. These include: Canes. Walkers. Scooters. Crutches. Turn on the lights when you go into a dark area. Replace any light bulbs as soon as they burn out. Set up your furniture so you have a clear path. Avoid moving your furniture around. If any of your floors are uneven, fix them. If there are any pets around you, be aware of where they are. Review your medicines with your doctor. Some medicines can make you feel dizzy. This can increase your chance of falling. Ask your doctor what other things that you can do to help prevent falls. This information is not intended to replace advice given to you by your health care provider. Make sure you discuss any questions you have with your health care provider. Document Released: 01/11/2009 Document Revised: 08/23/2015 Document Reviewed: 04/21/2014 Elsevier Interactive Patient Education  2017 [employer].

## 2022-06-09 NOTE — Progress Notes (Signed)
Subjective:   [name] is a 72 y.o. male who presents for Medicare Annual/Subsequent preventive examination.  Review of Systems    Virtual Visit via Telephone Note  I connected with  [name] on 06/09/22 at  9:15 AM EDT by telephone and verified that I am speaking with the correct person using two identifiers.  Location: Patient: Home Provider: Office Persons participating in the virtual visit: patient/Nurse Health Advisor   I discussed the limitations, risks, security and privacy concerns of performing an evaluation and management service by telephone and the availability of in person appointments. The patient expressed understanding and agreed to proceed.  Interactive audio and video telecommunications were attempted between this nurse and patient, however failed, due to patient having technical difficulties OR patient did not have access to video capability.  We continued and completed visit with audio only.  Some vital signs may be absent or patient reported.   Criselda Peaches, LPN  Cardiac Risk Factors include: advanced age (>73mn, >>63women);male gender;hypertension     Objective:    Today's Vitals   06/09/22 0852  Weight: 205 lb (93 kg)  Height: '5\' 3"'$  (1.6 m)   Body mass index is 36.31 kg/m.     06/09/2022    9:04 AM 05/10/2022    7:35 AM 05/07/2022    2:36 PM 05/06/2022    3:56 PM 05/06/2022    3:52 PM 03/14/2022    5:51 AM 03/07/2022   11:21 AM  Advanced Directives  Does Patient Have a Medical Advance Directive? No No No No No No No  Would patient like information on creating a medical advance directive? No - Patient declined No - Patient declined No - Patient declined No - Patient declined  No - Patient declined No - Patient declined    Current Medications (verified) Outpatient Encounter Medications as of 06/09/2022  Medication Sig   amLODipine-benazepril (LOTREL) 5-10 MG capsule TAKE 1 CAPSULE BY MOUTH EVERY DAY (Patient taking differently: Take  1 capsule by mouth daily.)   Artificial Tear Solution (SOOTHE XP) SOLN Place 1 drop into both eyes 3 (three) times daily as needed (for dryness).   Azelastine HCl 137 MCG/SPRAY SOLN PLACE 1-2 SPRAYS INTO BOTH NOSTRILS 2 (TWO) TIMES DAILY AS NEEDED FOR RHINITIS. (Patient taking differently: Place 1-2 sprays into both nostrils 2 (two) times daily as needed (for rhinitis).)   clobetasol (OLUX) 0.05 % topical foam Apply 1 application  topically daily as needed (scalp itching).   Cyanocobalamin (B-12) 1000 MCG SUBL Place 1,000 mcg under the tongue daily.   escitalopram (LEXAPRO) 10 MG tablet TAKE 1 TABLET BY MOUTH EVERY DAY (Patient taking differently: Take 10 mg by mouth daily.)   fexofenadine (ALLEGRA) 180 MG tablet Take 180 mg by mouth daily.   fluticasone (FLONASE) 50 MCG/ACT nasal spray SPRAY 2 SPRAYS INTO EACH NOSTRIL EVERY DAY (Patient taking differently: Place 2 sprays into both nostrils daily.)   ibuprofen (ADVIL) 800 MG tablet Take 1 tablet (800 mg total) by mouth every 8 (eight) hours as needed.   Ipratropium-Albuterol (COMBIVENT RESPIMAT) 20-100 MCG/ACT AERS respimat INHALE 2 PUFFS UP TO FOUR TIMES A DAY AS NEEDED (Patient taking differently: Inhale 2 puffs into the lungs 4 (four) times daily as needed for shortness of breath or wheezing.)   PRESCRIPTION MEDICATION CPAP- At bedtime and during any time of rest   silodosin (RAPAFLO) 8 MG CAPS capsule Take 1 capsule (8 mg total) by mouth at bedtime.   triamcinolone cream (  KENALOG) 0.1 % Apply 1 application  topically daily as needed (to affected areas- for itching).   WIXELA INHUB 100-50 MCG/ACT AEPB TAKE 1 PUFF BY MOUTH TWICE A DAY (Patient taking differently: Inhale 1 puff into the lungs 2 (two) times daily.)   [DISCONTINUED] cyclobenzaprine (FLEXERIL) 10 MG tablet Take 1 tablet (10 mg total) by mouth 3 (three) times daily as needed for muscle spasms.   [DISCONTINUED] ondansetron (ZOFRAN) 4 MG tablet Take 1 tablet (4 mg total) by mouth every 8  (eight) hours as needed for nausea or vomiting.   [DISCONTINUED] oxyCODONE-acetaminophen (PERCOCET) 5-325 MG tablet Take 1 tablet by mouth every 4 (four) hours as needed (max 6 q).   No facility-administered encounter medications on file as of 06/09/2022.    Allergies (verified) Tape and Egg-derived products   History: Past Medical History:  Diagnosis Date   Allergy    Arthritis    knees   Asthma    Depression    Dysrhythmia    RBBB   Elevated PSA    [place] and bx negative 2011   Hypertension    nl cardiolite in 1998   Pneumonia 2005   right hospitalized    Rhinosinusitis    Sleep apnea    wears CPAP   Past Surgical History:  Procedure Laterality Date   COLONOSCOPY  03/09/2013   ECTROPION REPAIR Left    2023   EYE SURGERY Left 04/08/2022   to bring lower eyelid up   [place] TOTAL KNEE  01/2010   left; Dr Alvan Dame; RIght knee 06-2010   REVERSE SHOULDER ARTHROPLASTY Right 03/14/2022   Procedure: REVERSE SHOULDER ARTHROPLASTY WITH REMOVAL OF ANATOMIC ARTHROPLASTY;  Surgeon: [name], MD;  Location: [facility];  Service: Orthopedics;  Laterality: Right;   right shoulder arthroplasty     2018   shoulder atrhroscopic surgery  2011   Right   TOTAL KNEE REVISION Left 12/05/2021   Procedure: TOTAL KNEE REVISION;  Surgeon: Paralee Cancel, MD;  Location: [facility];  Service: Orthopedics;  Laterality: Left;   TOTAL SHOULDER REVISION Right 05/09/2022   Procedure: Revision Right Reverse Shoulder Arthroplasty;  Surgeon: [name], MD;  Location: [facility];  Service: Orthopedics;  Laterality: Right;  17mn   VENTRAL HERNIA REPAIR  03/2007   rerepair winter [id number]  umbilical per pt   Family History  Problem Relation Age of Onset   Heart failure Father        MV replacement   Myelodysplastic syndrome Father    Asthma Mother    Polycythemia Brother    Asthma Other    Hypertension Other    Diabetes  Maternal Grandmother    Healthy Sister    Healthy Brother    Healthy Brother    Colon cancer Neg Hx    Pancreatic cancer Neg Hx    Stomach cancer Neg Hx    Esophageal cancer Neg Hx    Colon polyps Neg Hx    Rectal cancer Neg Hx    Social History   Socioeconomic History   Marital status: Single    Spouse name: Not on file   Number of children: Not on file   Years of education: Not on file   Highest education level: Bachelor's degree (e.g., BA, AB, BS)  Occupational History   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   Stopped drinking in 1986    Nonosmoker    Single   HHof 1    Social Determinants of Health   Financial Resource Strain: Low Risk  (06/09/2022)   Overall Financial Resource Strain (CARDIA)    Difficulty of Paying Living Expenses: Not hard at all  Food Insecurity: No Food Insecurity (06/09/2022)   Hunger Vital Sign    Worried About Running Out of Food in the Last Year: Never true    Ran Out of Food in the Last Year: Never true  Transportation Needs: No Transportation Needs (06/09/2022)   PRAPARE - Hydrologist (Medical): No    Lack of Transportation (Non-Medical): No  Physical Activity: Insufficiently Active (06/09/2022)   Exercise Vital Sign    Days of Exercise per Week: 2 days    Minutes of Exercise per Session: 60 min  Stress: No Stress Concern Present (06/09/2022)   [place]    Feeling of Stress : Not at all  Social Connections: Moderately Isolated (06/09/2022)   Social Connection and Isolation Panel  [NHANES]    Frequency of Communication with Friends and Family: More than three times a week    Frequency of Social Gatherings with Friends and Family: More than three times a week    Attends Religious Services: Never    Marine scientist or Organizations: Yes    Attends Music therapist: More than 4 times per year    Marital Status: Divorced    Tobacco Counseling Counseling given: Not Answered   Clinical Intake:  Pre-visit preparation completed: Yes  Pain : No/denies pain     BMI - recorded: 36.31 Nutritional Status: BMI > 30  Obese Nutritional Risks: None Diabetes: No  How often do you need to have someone help you when you read instructions, pamphlets, or other written materials from your doctor or pharmacy?: 1 - Never  Diabetic? No  Interpreter Needed?: No  Information entered by :: Rolene Arbour LPN   Activities of Daily Living    06/09/2022    9:01 AM 06/08/2022    7:50 PM  In your present state of health, do you have any difficulty performing the following activities:  Hearing? 0 0  Vision? 0 0  Difficulty concentrating or making decisions? 0 0  Walking or climbing stairs? 0 0  Dressing or bathing? 0 0  Doing errands, shopping? 0 0  Preparing Food and eating ? N N  Using the Toilet? N N  In the past six months, have you accidently leaked urine? N N  Do you have problems with loss of bowel control? N N  Managing your Medications? N N  Managing your Finances? N N  Housekeeping or managing your Housekeeping? N N    Patient Care Team: Panosh, Standley Brooking, MD as PCP - General orthopedic Festus Aloe, MD as Attending Physician (Urology) Deneise Lever, MD as Attending Physician (Pulmonary Disease)  Indicate any recent Medical Services you may have received from other than [facility] providers  in the past year (date may be approximate).     Assessment:   This is a routine wellness examination for Progress.  Hearing/Vision  screen Hearing Screening - Comments:: Denies hearing difficulties   Vision Screening - Comments:: Wears rx glasses - up to date with routine eye exams with  Dr Gershon Crane  Dietary issues and exercise activities discussed: Current Exercise Habits: Home exercise routine, Type of exercise: walking, Time (Minutes): 60, Frequency (Times/Week): 2, Weekly Exercise (Minutes/Week): 120, Intensity: Mild, Exercise limited by: None identified;orthopedic condition(s)   Goals Addressed               This Visit's Progress     Patient stated (pt-stated)        I want to heal from shoulder surgery, and get back on my bike.       Depression Screen    06/09/2022    8:58 AM 01/21/2022   11:41 AM 06/06/2021    9:12 AM 06/05/2020   10:40 AM 07/08/2019    9:44 AM  PHQ 2/9 Scores  PHQ - 2 Score 0 0 0 0 0  PHQ- 9 Score  0   0    Fall Risk    06/09/2022    9:02 AM 06/08/2022    7:50 PM 01/21/2022   11:41 AM 11/04/2021    4:26 PM 06/06/2021    9:15 AM  [place] in the past year? 0 0 0 0 0  Number falls in past yr: 0  0  0  Injury with Fall? 0  0  0  Risk for fall due to : No Fall Risks  No Fall Risks  No Fall Risks  Follow up Falls prevention discussed  Falls evaluation completed      FALL RISK PREVENTION PERTAINING TO THE HOME:  Any stairs in or around the home? No If so, are there any without handrails? No  Home free of loose throw rugs in walkways, pet beds, electrical cords, etc? Yes  Adequate lighting in your home to reduce risk of falls? Yes   ASSISTIVE DEVICES UTILIZED TO PREVENT FALLS:  Life alert? No  Use of a cane, walker or w/c? No  Grab bars in the bathroom? No  Shower chair or bench in shower? No  Elevated toilet seat or a handicapped toilet? No   TIMED UP AND GO:  Was the test performed? No . Audio Visit   Cognitive Function:        06/09/2022    9:04 AM 06/06/2021    9:17 AM  6CIT Screen  What Year? 0 points 0 points  What month? 0 points 0 points  What time?  0 points 0 points  Count back from 20 0 points 0 points  Months in reverse 0 points 0 points  Repeat phrase 0 points 0 points  Total Score 0 points 0 points    Immunizations Immunization History  Administered Date(s) Administered   Fluad Quad(high Dose 65+) 01/11/2019, 01/06/2020, 12/24/2020   H1N1 04/03/2008   Influenza Split 04/08/2011, 02/19/2012, 12/27/2015   Influenza Whole 02/02/2007, 03/14/2009, 01/28/2010   Influenza, High Dose Seasonal PF 12/31/2021   Influenza,inj,Quad PF,6+ Mos 12/01/2012, 12/16/2013, 12/20/2014, 12/30/2016, 01/05/2018   Influenza,inj,quad, With Preservative 12/29/2016   Moderna Sars-Covid-2 Vaccination 05/02/2019, 05/31/2019, 01/22/2020, 06/29/2020   PNEUMOCOCCAL CONJUGATE-20 11/06/2021   Pfizer Covid-19 Vaccine Bivalent Booster 45yr & up 12/24/2020   Pneumococcal Conjugate-13 10/19/2013   Pneumococcal Polysaccharide-23 09/17/2009, 11/07/2016   Respiratory Syncytial Virus Vaccine,Recomb Aduvanted(Arexvy) 12/31/2021  Td 09/17/2009   Unspecified SARS-COV-2 Vaccination 01/08/2022   Zoster Recombinat (Shingrix) 11/07/2016, 02/18/2017   Zoster, Live 11/02/2015    TDAP status: Due, Education has been provided regarding the importance of this vaccine. Advised may receive this vaccine at local pharmacy or Health Dept. Aware to provide a copy of the vaccination record if obtained from local pharmacy or Health Dept. Verbalized acceptance and understanding.  Flu Vaccine status: Up to date  Pneumococcal vaccine status: Up to date  Covid-19 vaccine status: Completed vaccines  Qualifies for Shingles Vaccine? Yes   Zostavax completed Yes   Shingrix Completed?: Yes  Screening Tests Health Maintenance  Topic Date Due   DTaP/Tdap/Td (2 - Tdap) 09/18/2019   COVID-19 Vaccine (7 - 2023-24 season) 06/25/2022 (Originally 03/05/2022)   COLONOSCOPY (Pts 45-49yr Insurance coverage will need to be confirmed)  06/24/2022   Medicare Annual Wellness (AWV)  06/09/2023    Pneumonia Vaccine 72 Years old  Completed   INFLUENZA VACCINE  Completed   Hepatitis C Screening  Completed   Zoster Vaccines- Shingrix  Completed   HPV VACCINES  Aged Out    Health Maintenance  Health Maintenance Due  Topic Date Due   DTaP/Tdap/Td (2 - Tdap) 09/18/2019    Colorectal cancer screening: Type of screening: Colonoscopy. Completed 06/24/19. Repeat every 3 years  Lung Cancer Screening: (Low Dose CT Chest recommended if Age 71-80years, 30 pack-year currently smoking OR have quit w/in 15years.) does not qualify.     Additional Screening:  Hepatitis C Screening: does qualify; Completed 01/02/16  Vision Screening: Recommended annual ophthalmology exams for early detection of glaucoma and other disorders of the eye. Is the patient up to date with their annual eye exam?  Yes  Who is the provider or what is the name of the office in which the patient attends annual eye exams? Dr SGershon CraneIf pt is not established with a provider, would they like to be referred to a provider to establish care? No .   Dental Screening: Recommended annual dental exams for proper oral hygiene  Community Resource Referral / Chronic Care Management:  CRR required this visit?  No   CCM required this visit?  No      Plan:     I have personally reviewed and noted the following in the patient's chart:   Medical and social history Use of alcohol, tobacco or illicit drugs  Current medications and supplements including opioid prescriptions. Patient is not currently taking opioid prescriptions. Functional ability and status Nutritional status Physical activity Advanced directives List of other physicians Hospitalizations, surgeries, and ER visits in previous 12 months Vitals Screenings to include cognitive, depression, and falls Referrals and appointments  In addition, I have reviewed and discussed with patient certain preventive protocols, quality metrics, and best practice  recommendations. A written personalized care plan for preventive services as well as general preventive health recommendations were provided to patient.     BCriselda Peaches LPN   [id number]  Nurse Notes: None

## 2022-06-12 ENCOUNTER — Other Ambulatory Visit: Payer: Self-pay

## 2022-06-12 DIAGNOSIS — J45909 Unspecified asthma, uncomplicated: Secondary | ICD-10-CM

## 2022-06-12 DIAGNOSIS — I10 Essential (primary) hypertension: Secondary | ICD-10-CM

## 2022-06-18 DIAGNOSIS — Z96611 Presence of right artificial shoulder joint: Secondary | ICD-10-CM | POA: Diagnosis not present

## 2022-06-18 DIAGNOSIS — Z471 Aftercare following joint replacement surgery: Secondary | ICD-10-CM | POA: Diagnosis not present

## 2022-06-23 DIAGNOSIS — G4733 Obstructive sleep apnea (adult) (pediatric): Secondary | ICD-10-CM | POA: Diagnosis not present

## 2022-07-08 DIAGNOSIS — M25511 Pain in right shoulder: Secondary | ICD-10-CM | POA: Diagnosis not present

## 2022-07-09 DIAGNOSIS — M25862 Other specified joint disorders, left knee: Secondary | ICD-10-CM | POA: Diagnosis not present

## 2022-07-09 DIAGNOSIS — Z96652 Presence of left artificial knee joint: Secondary | ICD-10-CM | POA: Diagnosis not present

## 2022-07-14 ENCOUNTER — Other Ambulatory Visit: Payer: Self-pay | Admitting: Internal Medicine

## 2022-07-15 ENCOUNTER — Telehealth: Payer: Self-pay

## 2022-07-15 DIAGNOSIS — L03116 Cellulitis of left lower limb: Secondary | ICD-10-CM | POA: Diagnosis not present

## 2022-07-15 DIAGNOSIS — L603 Nail dystrophy: Secondary | ICD-10-CM | POA: Diagnosis not present

## 2022-07-15 DIAGNOSIS — I872 Venous insufficiency (chronic) (peripheral): Secondary | ICD-10-CM | POA: Diagnosis not present

## 2022-07-15 NOTE — Progress Notes (Signed)
Patient ID: [name], male   DOB: 01-Jul-1950, 72 y.o.   MRN: [id number]  Care Management & Coordination Services Pharmacy Team  Reason for Encounter: Chart Prep for initial encounter on 07/18/22 at 9 am in office  Have you seen any other providers since your last visit?  Patient reports yes last week.  Any changes in your medications or health? Patient reports he was started on diclofenac for his knee  Any side effects from any medications? 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  Do you have an symptoms or problems not managed by your medications? 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  Has your provider asked that you check blood pressure, blood sugar, or follow special diet at home? Patient reports he does have a stethoscope and blood pressure cuff but does not check as is hard to do himself does not own an automatic cuff.   Do you get any type of exercise on a regular basis? Patient reports he is recovering from knee and shoulder surgeries is doing therapies.  Can you think of a goal you would like to reach for your health? Patient wants to heal and gain back mobility to get back to bike riding and scuba diving  Do you have any problems getting your medications? [facility] reports [facility] good [facility], has several that were on auto fill that he has too much of and had to take them off, he reports they are for the most part affordable, most expensive is the Combivent but he does not use this medication daily.  Patient aware to  medications that do not need refrigeration and  supplements to appointment   Chart review:  Recent office visits:  06/09/22 Tillie Rung, LPN - Patient presented for Medicare Annual Wellness Exam. Stopped Cyclobenzaprine. Stopped Zofran. Stopped Oxycodone Acetaminophen. Patient voiced goal of getting back to bike riding.  06/02/22 Zaldivar, Etta Quill, MD (Plastic Surg) - Patient presented for Cicatricial ectropion and other concerns. No medication changes.   01/21/22 Panosh, [name], MD - Patient presented for preventative health exam and other concerns. Stopped Guaifenesin.   Recent consult visits:  05/09/22 Francena Hanly, MD - Patient presented to [facility] for pre-op testing. No medication changes.   04/24/22 Jeni Salles, PA - Patient presented for pre op visit . No medication changes.   04/08/22 Reynolds Bowl, MD -  Patient presented for Left Lower Eyelid Ectropion Repair And Conjunctivoplasty consult. No medication changes.   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 Stopped Vivactil.   03/26/22 Supple, Francena Hanly, MD - Patient presented to [facility] for Aftercare. No medication changes.   02/03/22 Purser, Addison Lank  DPT - Patient presented to [facility] for pain of left knee joint. No medication changes.   01/29/22 Hoshal, Bjorn Loser, MD - Patient presented for obstruction of nasal valve and other concerns. No medication changes.   01/27/22 Reynolds Bowl, MD -  Patient presented for  Cicatricial ectropion and other concerns. No medication changes.   01/24/2022  Supple, Francena Hanly, MD - Patient presented to [facility] for pain of right shoulder joint and other concerns.   01/20/22 Rosalene Billings DONOVAN  PA-C - Patient presented to [facility] for Pain of left knee joint.  N medication changes.   01/15/22 Shelly Flatten, MD (ENT) - Patient presented for Obstruction of nasal valve  and other concerns. No medication changes.   Hospital visits:  Medication Reconciliation was completed by comparing discharge summary, patient's EMR and Pharmacy list, and upon discussion with patient.  Patient presented to [facility] ED on 05/06/22 due to Dislocation of right shoulder joint. Patient was present for 1 hour.  New?Medications Started at [facility] Discharge:?? -started  None  Medication Changes at Hospital Discharge: -Changed  none  Medications Discontinued at Hospital Discharge: -Stopped  none  Medications that remain the same after Hospital Discharge:??  -All other medications will remain the same.     Medication Reconciliation was completed by comparing discharge summary, patient's EMR and Pharmacy list, and upon discussion with patient.  Patient presented to [facility] ED on 03/14/22 due to Reverse Shoulder Arthroplasty with removal of anatomic arthroplasty. Patient was present for 7 hours.  New?Medications Started at [facility] Discharge:?? -started  cyclobenzaprine (FLEXERIL) ondansetron (Zofran) oxyCODONE-acetaminophen (Percocet)  Medication Changes at Hospital Discharge: -Changed  none  Medications Discontinued at Hospital Discharge: -Stopped  none  Medications that remain the same after Hospital Discharge:??  -All other medications will remain the same.    Fill History  : AMLODIPINE-BENAZEPRIL 5-10 MG 06/08/2022 90   AZELASTINE 0.1% (137 MCG) SPRY 01/10/2022 90   CLOBETASOL PROP 0.05% FOAM 06/07/2022 25   CYCLOBENZAPRINE 10 MG TABLET 05/09/2022 10   DICLOFENAC SOD EC 75 MG TAB 07/09/2022 30   ESCITALOPRAM 10 MG TABLET 06/08/2022 90   FLUTICASONE PROP 50 MCG SPRAY 02/09/2022 30   COMBIVENT RESPIMAT 20-100 MCG 02/19/2022 30   Wixela Inhub 100 mcg-50 mcg/dose powder for inhalation 07/09/2022 30   IBUPROFEN 800 MG TABLET 06/04/2022 30   ONDANSETRON HCL 4 MG TABLET 05/09/2022 3   SILODOSIN 8 MG CAPSULE 05/26/2022 90   TRIAMCINOLONE  0.1% CREAM 04/29/2022 20   Star Rating Drugs:  Amlodipine -Benazepril 5-10 mg - [facility]/08/22 [facility]     Care Gaps: TDAP - Overdue COVID Booster - Overdue Colonoscopy - Overdue AWV - 06/09/22  Pamala Duffel CMA Clinical Pharmacist Assistant [phone]

## 2022-07-16 DIAGNOSIS — M25511 Pain in right shoulder: Secondary | ICD-10-CM | POA: Diagnosis not present

## 2022-07-17 NOTE — Progress Notes (Signed)
Care Management & Coordination Services Pharmacy Note  07/18/2022 Name:  Kaynan Klonowski Vandenbosch MRN:  [id number] DOB:  November 29, 1950  Summary: BP at goal <130/80 LDL not at goal <100 Issues tolerating silodosin due to side effects  Recommendations/Changes made from today's visit: -Counseled to obtain BP cuff for routine home BP monitoring -Counseled on diet and exercise extensively for cholesterol-lowering. Discussed benefit of water aerobics and low impact on joints. -Counseled on side effects of BPH medications and patient has f/u with urologist next month.  Follow up plan: General review in 2 months Pharmacist visit in 6-7 months   Subjective: [name] is an 72 y.o. year old male who is a primary patient of Panosh, [name], MD.  The care coordination team was consulted for assistance with disease management and care coordination needs.    Engaged with patient face to face for initial visit. Patient does present with all prescription and OTC medications. 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 Use to enjoy biking and scuba diving but has recently been limited due to shoulder/knee surgeries.  Recent office visits: 06/09/22 Tillie Rung, LPN - Patient presented for [facility] Annual Wellness Exam. Stopped Cyclobenzaprine. Stopped Zofran. Stopped Oxycodone Acetaminophen. Patient voiced goal of getting back to bike riding.   06/02/22 Zaldivar, Etta Quill, MD (Plastic Surg) - Patient presented for Cicatricial ectropion and other concerns. No medication changes.    01/21/22 Panosh, [name], MD - Patient presented for preventative health exam and other concerns. Stopped Guaifenesin.   Recent consult visits: 05/09/22 Francena Hanly, MD - Patient presented to [facility] for pre-op testing. No medication changes.    04/24/22 Jeni Salles, PA - Patient presented for pre op visit . No medication changes.    04/08/22 Reynolds Bowl, MD -  Patient presented for Left Lower Eyelid Ectropion Repair And Conjunctivoplasty consult. No medication changes.    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 Stopped Vivactil.    03/26/22 Supple, Francena Hanly, MD - Patient presented to [facility] for Aftercare. No medication changes.    02/03/22 Purser, Addison Lank  DPT - Patient presented to [facility] for pain of left knee joint. No medication changes.    01/29/22 Hoshal, Bjorn Loser, MD - Patient presented for obstruction of nasal valve and other concerns. No medication changes.    01/27/22 Reynolds Bowl, MD -  Patient presented for Cicatricial ectropion and other concerns. No medication changes.    01/24/2022  Supple, Francena Hanly, MD - Patient presented to [facility] for pain of right shoulder joint and other concerns.    01/20/22 Rosalene Billings DONOVAN  PA-C - Patient presented to [facility] for Pain of left knee joint.  N medication changes.    01/15/22 Shelly Flatten, MD (ENT) - Patient presented for Obstruction of nasal valve and other concerns. No medication changes.   Hospital visits: 05/06/22 [facility] ED for dislocation of right shoulder, LOS 1 hour, no medication changes  03/14/22 - [facility] - For Reverse Shoulder Arthroplasty, LOS 7 hrs. START flexeril, zofran, percocet  Objective:  Lab Results  Component Value Date   CREATININE 0.66 05/07/2022   BUN 12 05/07/2022   GFR 95.92 03/25/2022   EGFR >90 07/25/2016   GFRNONAA >60 05/07/2022   GFRAA  07/03/2010    >60  The eGFR has been calculated using the MDRD equation. This calculation has not been validated in all clinical situations. eGFR's persistently <60 mL/min signify possible Chronic Kidney Disease.   NA 136 05/07/2022   K 4.5 05/07/2022   CALCIUM 9.1 05/07/2022   CO2 26 05/07/2022   GLUCOSE 96 05/07/2022    Lab Results   Component Value Date/Time   HGBA1C 5.5 11/06/2021 12:47 PM   HGBA1C 5.5 12/24/2020 09:09 AM   GFR 95.92 03/25/2022 10:58 AM   GFR 93.61 11/06/2021 12:47 PM    Last diabetic Eye exam: No results found for: "HMDIABEYEEXA"  Last diabetic Foot exam: No results found for: "HMDIABFOOTEX"   Lab Results  Component Value Date   CHOL 189 11/06/2021   HDL 54.60 11/06/2021   LDLCALC 125 (H) 11/06/2021   TRIG 47.0 11/06/2021   CHOLHDL 3 11/06/2021       Latest Ref Rng & Units 11/06/2021   12:47 PM 12/24/2020    9:09 AM 10/15/2020    1:44 PM  Hepatic Function  Total Protein 6.0 - 8.3 g/dL 7.2  6.5  6.6   Albumin 3.5 - 5.2 g/dL 4.6  4.4  4.0   AST 0 - 37 U/L 18  18  26    ALT 0 - 53 U/L 20  18  21    Alk Phosphatase 39 - 117 U/L 65  57  62   Total Bilirubin 0.2 - 1.2 mg/dL 0.8  0.9  1.0   Bilirubin, Direct 0.0 - 0.3 mg/dL 0.2  0.2      Lab Results  Component Value Date/Time   TSH 2.79 03/25/2022 10:58 AM   TSH 2.77 12/24/2020 09:09 AM       Latest Ref Rng & Units 05/07/2022    2:58 PM 03/25/2022   10:58 AM 03/07/2022   10:53 AM  CBC  WBC 4.0 - 10.5 K/uL 8.3  7.4  5.8   Hemoglobin 13.0 - 17.0 g/dL 16.1  09.6  04.5   Hematocrit 39.0 - 52.0 % 39.4  37.4  38.9   Platelets 150 - 400 K/uL 174  215.0  148     Lab Results  Component Value Date/Time   VITAMINB12 1,386 (H) 03/25/2022 10:58 AM   VITAMINB12 553 03/13/2020 09:03 AM    Clinical ASCVD: No  The 10-year ASCVD risk score (Arnett DK, et al., 2019) is: 22.1%   Values used to calculate the score:     Age: 72 years     Sex: Male     Is Non-Hispanic African American: No     Diabetic: No     Tobacco smoker: No     Systolic Blood Pressure: 138 mmHg     Is BP treated: Yes     HDL Cholesterol: 54.6 mg/dL     Total Cholesterol: 189 mg/dL        07/07/8117    1:47 AM 01/21/2022   11:41 AM 06/06/2021    9:12 AM  Depression screen PHQ 2/9  Decreased Interest 0 0 0  Down, Depressed, Hopeless 0 0 0  PHQ - 2 Score 0 0 0  Altered  sleeping  0   Tired, decreased energy  0   Change in appetite  0   Feeling bad or failure about yourself   0   Trouble concentrating  0   Moving slowly or fidgety/restless  0   Suicidal thoughts  0   PHQ-9 Score  0   Difficult doing work/chores  Not difficult at  all      Social History   Tobacco Use  Smoking Status Never  Smokeless Tobacco Never   BP Readings from Last 3 Encounters:  05/10/22 118/70  05/07/22 (!) 151/87  05/06/22 138/75   Pulse Readings from Last 3 Encounters:  05/10/22 92  05/07/22 65  05/06/22 78   Wt Readings from Last 3 Encounters:  06/09/22 205 lb (93 kg)  05/09/22 208 lb (94.3 kg)  05/07/22 205 lb (93 kg)   BMI Readings from Last 3 Encounters:  06/09/22 36.31 kg/m  05/09/22 36.85 kg/m  05/07/22 36.31 kg/m    Allergies  Allergen Reactions   Tape Other (See Comments)    Causes bleeding and redness   Egg-Derived Products Diarrhea, Nausea Only and Other (See Comments)    Terrible stomach pain    Medications Reviewed Today     Reviewed by Sherrill Raring, RPH (Pharmacist) on 07/18/22 at 1129  Med List Status: <None>   Medication Order Taking? Sig Documenting Provider Last Dose Status Informant  amLODipine-benazepril (LOTREL) 5-10 MG capsule [id number]  TAKE 1 CAPSULE BY MOUTH EVERY DAY  Patient taking differently: Take 1 capsule by mouth daily.   Panosh, [name], MD  Active Self  Artificial Tear Solution (SOOTHE XP) SOLN [id number]  Place 1 drop into both eyes 3 (three) times daily as needed (for dryness). [provider]  Active Self  Azelastine HCl 137 MCG/SPRAY SOLN [id number]  PLACE 1-2 SPRAYS INTO BOTH NOSTRILS 2 (TWO) TIMES DAILY AS NEEDED FOR RHINITIS.  Patient taking differently: Place 1-2 sprays into both nostrils 2 (two) times daily as needed (for rhinitis).   Jetty Duhamel D, MD  Active Self  clobetasol (OLUX) 0.05 % topical foam [id number]  Apply 1 application  topically daily as needed (scalp itching). [provider]  Active Self  Cyanocobalamin (B-12) 1000 MCG SUBL [id number]  Place 1,000 mcg under the tongue daily. [provider]  Active Self  escitalopram (LEXAPRO) 10 MG tablet [id number]  TAKE 1 TABLET BY MOUTH EVERY DAY  Patient taking differently: Take 10 mg by mouth daily.   Panosh, [name], MD  Active Self  fexofenadine (ALLEGRA) 180 MG tablet [id number]  Take 180 mg by mouth daily. [provider]  Active Self  fluticasone (FLONASE) 50 MCG/ACT nasal spray [id number]  SPRAY 2 SPRAYS INTO EACH NOSTRIL EVERY DAY Jetty Duhamel D, MD  Active   ibuprofen (ADVIL) 800 MG tablet [id number]  Take 1 tablet (800 mg total) by mouth every 8 (eight) hours as needed. Shuford, French Ana, PA-C  Active   Ipratropium-Albuterol (COMBIVENT RESPIMAT) 20-100 MCG/ACT AERS respimat [id number]  INHALE 2 PUFFS UP TO FOUR TIMES A DAY AS NEEDED  Patient taking differently: Inhale 2 puffs into the lungs 4 (four) times daily as needed for shortness of breath or wheezing.   Jetty Duhamel D, MD  Active Self  Olopatadine HCl (PATADAY) 0.7 % SOLN [id number] Yes Apply to eye. [provider]  Active Self  PRESCRIPTION MEDICATION [id number]  CPAP- At bedtime and during any time of rest [provider]  Active Self  silodosin (RAPAFLO) 8 MG CAPS capsule [id number]  Take 1 capsule (8 mg total) by mouth at bedtime. Jetty Duhamel D, MD  Active Self  triamcinolone cream (KENALOG) 0.1 % [id number]  Apply 1 application  topically daily as needed (to affected areas- for itching). [provider]  Active Self  WIXELA INHUB 100-50 MCG/ACT AEPB [id number]  TAKE 1 PUFF BY MOUTH TWICE A DAY  Patient taking differently: Inhale 1 puff into the lungs 2 (two) times daily.   Panosh, [name], MD  Active Self            SDOH:  (Social Determinants of Health) assessments and interventions performed: Yes SDOH Interventions    Flowsheet Row Care Coordination from 07/18/2022 in [facility]  Clinical Support from 06/09/2022 in [facility] [place] Telephone from 05/12/2022 in [facility] Coordination Clinical Support from 06/06/2021 in [facility] [place] Clinical Support from 06/05/2020 in [facility] [place]  SDOH Interventions       Food Insecurity Interventions Intervention Not Indicated Intervention Not Indicated Intervention Not Indicated Intervention Not Indicated Intervention Not Indicated  Housing Interventions Intervention Not Indicated Intervention Not Indicated -- Intervention Not Indicated Intervention Not Indicated  Transportation Interventions -- Intervention Not Indicated Intervention Not Indicated Intervention Not Indicated Intervention Not Indicated  Utilities Interventions -- Intervention Not Indicated -- -- --  Alcohol Usage Interventions -- Intervention Not Indicated (Score <7) -- -- --  Financial Strain Interventions -- Intervention Not Indicated -- Intervention Not Indicated Intervention Not Indicated  Physical Activity Interventions -- Intervention Not Indicated -- Intervention Not Indicated Intervention Not Indicated  Stress Interventions -- Intervention Not Indicated -- Intervention Not Indicated Intervention Not Indicated  Social Connections Interventions -- Intervention Not Indicated -- Intervention Not Indicated Intervention Not Indicated       Medication Assistance: None required.  Patient affirms current coverage meets needs.  Medication Access: Within the past 30 days, how often has patient missed a dose of medication? Intentionally skipping silodosin at least once a week Is a pillbox or other method used to improve adherence? Yes  Factors that may affect medication adherence? Med side effects (silodosin) Are meds synced by current pharmacy? No  Are meds delivered by current pharmacy? No  Does patient experience delays in picking up medications due to  transportation concerns? No   [facility] Reviewed: Is patient disadvantaged to use [facility]?: No  Current Rx insurance plan: [employer] Name and location of Current pharmacy:  [phone], [place] - [address] [place] [address] Phone: [phone] Fax: [phone]  [facility] services reviewed with patient today?: No  Patient requests to transfer care to [facility]?: No  Reason patient declined to change pharmacies: Not mentioned at this visit  Compliance/Adherence/Medication fill history: Care Gaps: TDAP - Overdue COVID Booster - Overdue Colonoscopy - Overdue AWV - 06/09/22  Star-Rating Drugs: Amlodipine -Benazepril 5-10 mg - [facility]22 [facility]    Assessment/Plan   Hypertension (BP goal <130/80) -Controlled -Current treatment: Amlodipine-Benazepril 5-10mg  1 qd Appropriate, Effective, Safe, Accessible -Medications previously tried: None  -Current home readings: No cuff at home to check -Current dietary habits: Mindful of salt intake in his diet -Current exercise habits: Limited due to shoulder/knee surgeries in the last 6-8 months but has started to get back into walking -Denies hypotensive/hypertensive symptoms -Educated on BP goals and benefits of medications for prevention of heart attack, stroke and kidney damage; Daily salt intake goal < 2300 mg; Exercise goal of 150 minutes per week; Importance of home blood pressure monitoring; Proper BP monitoring technique; -Counseled to monitor BP at home once weekly, document, and provide log at future appointments -Recommended to continue current medication  Mild-Intermittent Astma(Goal: control symptoms and prevent exacerbations) -Controlled -Current treatment  Allegra  1 qd Appropriate, Effective, Safe, Accessible  Fluticasone Nasal Spray 2 sprays into each nostril daily Appropriate, Effective, Safe, Accessible Wixela 100-50 1  puff BID Appropriate, Effective, Safe, Accessible Combivent 2 puffs up to QID PRN Appropriate, Effective, Safe, Accessible -Medications previously tried: Breo, Accolate  -Pulmonary function testing: FEV1/FVC 94% 06/11/2017 (was 69% pre) -Exacerbations requiring treatment in last 6 months: None -Patient reports consistent use of maintenance inhaler -Frequency of rescue inhaler use: less than once daily -Counseled on Proper inhaler technique; Benefits of consistent maintenance inhaler use When to use rescue inhaler Differences between maintenance and rescue inhalers -Recommended to continue current medication  Depression/Anxiety (Goal: Well-controlled mood that still allows for ADLs) -Not assessed today -Current treatment: Escitalopram  1 qd Appropriate, Effective, Safe, Accessible   Query BPH (Goal: PSA WNL and reduction in urinary symptoms) -Not ideally controlled -Current treatment  Silodosin  1 qhs Appropriate, Effective, Safe, Accessible -Medications previously tried: None  -Patient complains of ED/retrograde ejaculation with use. Discussed side effect profiles of all alpha blocker medications and patient does have f/u scheduled with urologist for next month to discuss. Has been skipping doses of med due to this concern.  Sherrill Raring Clinical Pharmacist [phone]

## 2022-07-18 ENCOUNTER — Ambulatory Visit: Payer: Medicare PPO

## 2022-07-18 DIAGNOSIS — M25511 Pain in right shoulder: Secondary | ICD-10-CM | POA: Diagnosis not present

## 2022-07-22 DIAGNOSIS — M25511 Pain in right shoulder: Secondary | ICD-10-CM | POA: Diagnosis not present

## 2022-07-23 DIAGNOSIS — L03116 Cellulitis of left lower limb: Secondary | ICD-10-CM | POA: Diagnosis not present

## 2022-07-23 DIAGNOSIS — B351 Tinea unguium: Secondary | ICD-10-CM | POA: Diagnosis not present

## 2022-07-23 DIAGNOSIS — I872 Venous insufficiency (chronic) (peripheral): Secondary | ICD-10-CM | POA: Diagnosis not present

## 2022-07-25 DIAGNOSIS — M25511 Pain in right shoulder: Secondary | ICD-10-CM | POA: Diagnosis not present

## 2022-07-29 DIAGNOSIS — M25511 Pain in right shoulder: Secondary | ICD-10-CM | POA: Diagnosis not present

## 2022-07-29 DIAGNOSIS — M25611 Stiffness of right shoulder, not elsewhere classified: Secondary | ICD-10-CM | POA: Diagnosis not present

## 2022-07-30 DIAGNOSIS — Z96611 Presence of right artificial shoulder joint: Secondary | ICD-10-CM | POA: Diagnosis not present

## 2022-07-30 DIAGNOSIS — Z471 Aftercare following joint replacement surgery: Secondary | ICD-10-CM | POA: Diagnosis not present

## 2022-07-31 DIAGNOSIS — M25511 Pain in right shoulder: Secondary | ICD-10-CM | POA: Diagnosis not present

## 2022-07-31 DIAGNOSIS — M25611 Stiffness of right shoulder, not elsewhere classified: Secondary | ICD-10-CM | POA: Diagnosis not present

## 2022-08-03 ENCOUNTER — Encounter: Payer: Self-pay | Admitting: Internal Medicine

## 2022-08-04 DIAGNOSIS — H5213 Myopia, bilateral: Secondary | ICD-10-CM | POA: Diagnosis not present

## 2022-08-04 DIAGNOSIS — B351 Tinea unguium: Secondary | ICD-10-CM | POA: Diagnosis not present

## 2022-08-04 DIAGNOSIS — H524 Presbyopia: Secondary | ICD-10-CM | POA: Diagnosis not present

## 2022-08-04 DIAGNOSIS — H25813 Combined forms of age-related cataract, bilateral: Secondary | ICD-10-CM | POA: Diagnosis not present

## 2022-08-04 NOTE — Telephone Encounter (Signed)
We dont have ophthalmology in [place] .  [facility]  or [facility]   ate 2 groups that we use .

## 2022-08-05 DIAGNOSIS — M25511 Pain in right shoulder: Secondary | ICD-10-CM | POA: Diagnosis not present

## 2022-08-05 DIAGNOSIS — M25611 Stiffness of right shoulder, not elsewhere classified: Secondary | ICD-10-CM | POA: Diagnosis not present

## 2022-08-07 DIAGNOSIS — M25611 Stiffness of right shoulder, not elsewhere classified: Secondary | ICD-10-CM | POA: Diagnosis not present

## 2022-08-07 DIAGNOSIS — M25511 Pain in right shoulder: Secondary | ICD-10-CM | POA: Diagnosis not present

## 2022-08-12 DIAGNOSIS — M25511 Pain in right shoulder: Secondary | ICD-10-CM | POA: Diagnosis not present

## 2022-08-12 DIAGNOSIS — M25611 Stiffness of right shoulder, not elsewhere classified: Secondary | ICD-10-CM | POA: Diagnosis not present

## 2022-08-13 DIAGNOSIS — L03116 Cellulitis of left lower limb: Secondary | ICD-10-CM | POA: Diagnosis not present

## 2022-08-13 DIAGNOSIS — R972 Elevated prostate specific antigen [PSA]: Secondary | ICD-10-CM | POA: Diagnosis not present

## 2022-08-13 DIAGNOSIS — Z96652 Presence of left artificial knee joint: Secondary | ICD-10-CM | POA: Diagnosis not present

## 2022-08-13 DIAGNOSIS — M25862 Other specified joint disorders, left knee: Secondary | ICD-10-CM | POA: Diagnosis not present

## 2022-08-13 LAB — PSA: PSA: 6.03

## 2022-08-14 DIAGNOSIS — B351 Tinea unguium: Secondary | ICD-10-CM | POA: Diagnosis not present

## 2022-08-14 DIAGNOSIS — M25511 Pain in right shoulder: Secondary | ICD-10-CM | POA: Diagnosis not present

## 2022-08-14 DIAGNOSIS — M25611 Stiffness of right shoulder, not elsewhere classified: Secondary | ICD-10-CM | POA: Diagnosis not present

## 2022-08-14 DIAGNOSIS — I872 Venous insufficiency (chronic) (peripheral): Secondary | ICD-10-CM | POA: Diagnosis not present

## 2022-08-19 DIAGNOSIS — M25611 Stiffness of right shoulder, not elsewhere classified: Secondary | ICD-10-CM | POA: Diagnosis not present

## 2022-08-19 DIAGNOSIS — M25511 Pain in right shoulder: Secondary | ICD-10-CM | POA: Diagnosis not present

## 2022-08-21 DIAGNOSIS — I872 Venous insufficiency (chronic) (peripheral): Secondary | ICD-10-CM | POA: Diagnosis not present

## 2022-08-21 DIAGNOSIS — M25511 Pain in right shoulder: Secondary | ICD-10-CM | POA: Diagnosis not present

## 2022-08-21 DIAGNOSIS — M25611 Stiffness of right shoulder, not elsewhere classified: Secondary | ICD-10-CM | POA: Diagnosis not present

## 2022-08-21 DIAGNOSIS — B351 Tinea unguium: Secondary | ICD-10-CM | POA: Diagnosis not present

## 2022-08-22 DIAGNOSIS — R972 Elevated prostate specific antigen [PSA]: Secondary | ICD-10-CM | POA: Diagnosis not present

## 2022-08-22 DIAGNOSIS — N401 Enlarged prostate with lower urinary tract symptoms: Secondary | ICD-10-CM | POA: Diagnosis not present

## 2022-08-22 DIAGNOSIS — R3912 Poor urinary stream: Secondary | ICD-10-CM | POA: Diagnosis not present

## 2022-08-22 DIAGNOSIS — N5201 Erectile dysfunction due to arterial insufficiency: Secondary | ICD-10-CM | POA: Diagnosis not present

## 2022-08-26 DIAGNOSIS — M25611 Stiffness of right shoulder, not elsewhere classified: Secondary | ICD-10-CM | POA: Diagnosis not present

## 2022-08-26 DIAGNOSIS — M25511 Pain in right shoulder: Secondary | ICD-10-CM | POA: Diagnosis not present

## 2022-08-27 ENCOUNTER — Encounter: Payer: Self-pay | Admitting: Internal Medicine

## 2022-08-29 ENCOUNTER — Encounter: Payer: Self-pay | Admitting: Internal Medicine

## 2022-09-01 DIAGNOSIS — H02115 Cicatricial ectropion of left lower eyelid: Secondary | ICD-10-CM | POA: Diagnosis not present

## 2022-09-01 DIAGNOSIS — H02535 Eyelid retraction left lower eyelid: Secondary | ICD-10-CM | POA: Diagnosis not present

## 2022-09-01 DIAGNOSIS — H0279 Other degenerative disorders of eyelid and periocular area: Secondary | ICD-10-CM | POA: Diagnosis not present

## 2022-09-01 DIAGNOSIS — H04123 Dry eye syndrome of bilateral lacrimal glands: Secondary | ICD-10-CM | POA: Diagnosis not present

## 2022-09-01 DIAGNOSIS — H04223 Epiphora due to insufficient drainage, bilateral lacrimal glands: Secondary | ICD-10-CM | POA: Diagnosis not present

## 2022-09-01 DIAGNOSIS — H02112 Cicatricial ectropion of right lower eyelid: Secondary | ICD-10-CM | POA: Diagnosis not present

## 2022-09-01 DIAGNOSIS — Z09 Encounter for follow-up examination after completed treatment for conditions other than malignant neoplasm: Secondary | ICD-10-CM | POA: Diagnosis not present

## 2022-09-01 DIAGNOSIS — H5069 Other mechanical strabismus: Secondary | ICD-10-CM | POA: Diagnosis not present

## 2022-09-01 DIAGNOSIS — H16211 Exposure keratoconjunctivitis, right eye: Secondary | ICD-10-CM | POA: Diagnosis not present

## 2022-09-02 DIAGNOSIS — M25611 Stiffness of right shoulder, not elsewhere classified: Secondary | ICD-10-CM | POA: Diagnosis not present

## 2022-09-02 DIAGNOSIS — M25511 Pain in right shoulder: Secondary | ICD-10-CM | POA: Diagnosis not present

## 2022-09-03 DIAGNOSIS — Z96652 Presence of left artificial knee joint: Secondary | ICD-10-CM | POA: Diagnosis not present

## 2022-09-03 DIAGNOSIS — Z471 Aftercare following joint replacement surgery: Secondary | ICD-10-CM | POA: Diagnosis not present

## 2022-09-04 DIAGNOSIS — M25611 Stiffness of right shoulder, not elsewhere classified: Secondary | ICD-10-CM | POA: Diagnosis not present

## 2022-09-04 DIAGNOSIS — M25511 Pain in right shoulder: Secondary | ICD-10-CM | POA: Diagnosis not present

## 2022-09-07 ENCOUNTER — Other Ambulatory Visit: Payer: Self-pay | Admitting: Internal Medicine

## 2022-09-08 ENCOUNTER — Encounter: Payer: Self-pay | Admitting: Internal Medicine

## 2022-09-09 DIAGNOSIS — M25511 Pain in right shoulder: Secondary | ICD-10-CM | POA: Diagnosis not present

## 2022-09-09 DIAGNOSIS — M25611 Stiffness of right shoulder, not elsewhere classified: Secondary | ICD-10-CM | POA: Diagnosis not present

## 2022-09-11 DIAGNOSIS — M25511 Pain in right shoulder: Secondary | ICD-10-CM | POA: Diagnosis not present

## 2022-09-11 DIAGNOSIS — M25611 Stiffness of right shoulder, not elsewhere classified: Secondary | ICD-10-CM | POA: Diagnosis not present

## 2022-09-16 DIAGNOSIS — M25611 Stiffness of right shoulder, not elsewhere classified: Secondary | ICD-10-CM | POA: Diagnosis not present

## 2022-09-16 DIAGNOSIS — M25511 Pain in right shoulder: Secondary | ICD-10-CM | POA: Diagnosis not present

## 2022-09-18 DIAGNOSIS — M25611 Stiffness of right shoulder, not elsewhere classified: Secondary | ICD-10-CM | POA: Diagnosis not present

## 2022-09-18 DIAGNOSIS — M25511 Pain in right shoulder: Secondary | ICD-10-CM | POA: Diagnosis not present

## 2022-09-20 NOTE — Progress Notes (Signed)
Subjective:    Patient ID: [name], male    DOB: May 01, 1950, 72 y.o.   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 FENO-12/26/2015- 30 (mildly elevated) ANCA- neg ANA + 1:80 Respiratory Allergy Profile 06/11/16--total IgE WNL 88, mild elevations for dust mite, cat, dog, aspergillus fumigatus, hickory tree pollen Office Spirometry 06/11/2016-WNL-FVC 3.95/103%, FEV1 2.71/95%, ratio 0.69, FEF 25-75% 1.60/ 69% Office Spirometry 06/11/17-minimal obstructive airways disease, small airways.  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 Otherwise several health problems have caught up with him.  A previous TKR needs revision, a shoulder repair needs revision.  He needs blepharoplasty.  These problems are preventing him from his usual exercise bike riding and scuba diving. His asthma has remained under very good control with no concern. He is having increased nasal congestion despite regular use of Flonase and azelastine.  He had had a septoplasty years ago by Dr. Annalee Genta and we discussed return for reevaluation.  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  Tolerated well with no respiratory problems.  Has not been diving recently because of this but hopes to start again.  Says his breathing is "good", not needing rescue inhaler.  ROS-see HPI + = positive Constitutional:   No-   weight loss, night sweats, fevers, chills, fatigue, lassitude. HEENT:   No-  headaches, difficulty swallowing, tooth/dental problems, sore throat,       No-  sneezing, itching, ear ache, nasal congestion, post nasal drip,  CV:  No-   chest pain, orthopnea, PND, +swelling in lower extremities, anasarca, dizziness, palpitations Resp: No-   shortness of breath with exertion or at rest.              No-   productive cough,  No non-productive cough,  No- coughing up of blood.              No-   change in color of mucus.   wheezing.   Skin: No-   rash or lesions. GI:  No-   heartburn, indigestion, abdominal pain, nausea, vomiting,  GU: n. MS:  No-   joint pain or swelling.   Neuro-  +early Parkinson's Psych:  No- change in mood or affect. No depression or anxiety.  No memory loss.  OBJ- Physical Exam General- Alert, Oriented, Affect-appropriate, Distress- none acute, + obese Skin-  Lymphadenopathy- none Head- atraumatic            Eyes- Gross vision intact, PERRLA, conjunctivae and secretions clear            Ears- Hearing, canals-normal  Nose-  + external dev, no- mucus, erosion, perforation             Throat- Mallampati II , mucosa clear , drainage- none, tonsils- atrophic Neck- flexible , trachea midline, no stridor , thyroid nl, carotid no bruit Chest - symmetrical excursion , unlabored           Heart/CV- RRR , no murmur , no gallop  , no rub, nl s1 s2                           - JVD- none ,  edema- none, stasis changes- none, varices- none           Lung- clear to P&A, wheeze- none, cough- none , dullness-none, rub- none           Chest wall-  Abd-  Br/ Gen/ Rectal- Not done, not indicated Extrem- cyanosis- none, clubbing, none, atrophy- none, strength- nl, + surgical scar L knee,  Neuro-non-focal

## 2022-09-22 ENCOUNTER — Ambulatory Visit: Payer: Medicare PPO | Admitting: Internal Medicine

## 2022-09-22 ENCOUNTER — Encounter: Payer: Self-pay | Admitting: Internal Medicine

## 2022-09-22 VITALS — BP 118/72 | HR 75 | Ht 64.0 in | Wt 211.6 lb

## 2022-09-22 DIAGNOSIS — J4520 Mild intermittent asthma, uncomplicated: Secondary | ICD-10-CM | POA: Diagnosis not present

## 2022-09-22 DIAGNOSIS — G4733 Obstructive sleep apnea (adult) (pediatric): Secondary | ICD-10-CM

## 2022-09-22 NOTE — Patient Instructions (Signed)
We can refill your meds as needed  Form completed  Please call if we can help

## 2022-10-07 ENCOUNTER — Other Ambulatory Visit: Payer: Self-pay | Admitting: Internal Medicine

## 2022-10-08 DIAGNOSIS — Z96652 Presence of left artificial knee joint: Secondary | ICD-10-CM | POA: Diagnosis not present

## 2022-10-08 DIAGNOSIS — M25562 Pain in left knee: Secondary | ICD-10-CM | POA: Diagnosis not present

## 2022-10-13 ENCOUNTER — Encounter: Payer: Self-pay | Admitting: Internal Medicine

## 2022-10-13 ENCOUNTER — Ambulatory Visit (AMBULATORY_SURGERY_CENTER): Payer: Medicare PPO | Admitting: *Deleted

## 2022-10-13 VITALS — Ht 64.0 in | Wt 210.0 lb

## 2022-10-13 DIAGNOSIS — Z86010 Personal history of colonic polyps: Secondary | ICD-10-CM

## 2022-10-13 MED ORDER — NA SULFATE-K SULFATE-MG SULF 17.5-3.13-1.6 GM/177ML PO SOLN
1.0000 | Freq: Once | ORAL | 0 refills | Status: AC
Start: 2022-10-13 — End: 2022-10-13

## 2022-10-13 NOTE — Progress Notes (Signed)
 Pt's name and DOB verified at the beginning of the pre-visit.  Pt denies any difficulty with ambulating,sitting, laying down or rolling side to side Gave both [facility] main # and MD on call # prior to instructions.  No egg or soy allergy known to patient  No issues known to pt with past sedation with any surgeries or procedures Pt denies having issues being intubated Pt has no issues moving head neck or swallowing No FH of Malignant Hyperthermia Pt is not on diet pills Pt is not on home 02  Pt is not on blood thinners  Pt denies issues with constipation  Pt is not on dialysis Pt denise any abnormal heart rhythms  Pt denies any upcoming cardiac testing Pt encouraged to use to use Singlecare or Goodrx to reduce cost  Patient's chart reviewed by Cathlyn Parsons CNRA prior to pre-visit and patient appropriate for the [facility].  Pre-visit completed and red dot placed by patient's name on their procedure day (on provider's schedule).  . Visit by phone Pt states weight is  Instructed pt why it is important to and  to call if they have any changes in health or new medications. Directed them to the # given and on instructions.   Pt states they will.  Instructions reviewed with pt and pt states understanding. Instructed to review again prior to procedure. Pt states they will.  Instructions sent by mail with coupon and by my chart

## 2022-10-27 DIAGNOSIS — H5069 Other mechanical strabismus: Secondary | ICD-10-CM | POA: Diagnosis not present

## 2022-10-27 DIAGNOSIS — H16212 Exposure keratoconjunctivitis, left eye: Secondary | ICD-10-CM | POA: Diagnosis not present

## 2022-10-27 DIAGNOSIS — H02535 Eyelid retraction left lower eyelid: Secondary | ICD-10-CM | POA: Diagnosis not present

## 2022-10-27 DIAGNOSIS — H0279 Other degenerative disorders of eyelid and periocular area: Secondary | ICD-10-CM | POA: Diagnosis not present

## 2022-10-27 DIAGNOSIS — H11822 Conjunctivochalasis, left eye: Secondary | ICD-10-CM | POA: Diagnosis not present

## 2022-10-27 DIAGNOSIS — H02135 Senile ectropion of left lower eyelid: Secondary | ICD-10-CM | POA: Diagnosis not present

## 2022-10-27 DIAGNOSIS — H02112 Cicatricial ectropion of right lower eyelid: Secondary | ICD-10-CM | POA: Diagnosis not present

## 2022-10-27 DIAGNOSIS — H02115 Cicatricial ectropion of left lower eyelid: Secondary | ICD-10-CM | POA: Diagnosis not present

## 2022-10-29 DIAGNOSIS — L218 Other seborrheic dermatitis: Secondary | ICD-10-CM | POA: Diagnosis not present

## 2022-10-29 DIAGNOSIS — Z85828 Personal history of other malignant neoplasm of skin: Secondary | ICD-10-CM | POA: Diagnosis not present

## 2022-10-29 DIAGNOSIS — L820 Inflamed seborrheic keratosis: Secondary | ICD-10-CM | POA: Diagnosis not present

## 2022-10-29 DIAGNOSIS — D692 Other nonthrombocytopenic purpura: Secondary | ICD-10-CM | POA: Diagnosis not present

## 2022-10-29 DIAGNOSIS — R21 Rash and other nonspecific skin eruption: Secondary | ICD-10-CM | POA: Diagnosis not present

## 2022-10-29 DIAGNOSIS — L821 Other seborrheic keratosis: Secondary | ICD-10-CM | POA: Diagnosis not present

## 2022-11-03 DIAGNOSIS — Z96611 Presence of right artificial shoulder joint: Secondary | ICD-10-CM | POA: Diagnosis not present

## 2022-11-05 ENCOUNTER — Encounter: Payer: Self-pay | Admitting: Internal Medicine

## 2022-11-05 NOTE — Assessment & Plan Note (Signed)
He is doing very well.  I reinforced our previous discussions about safety issues of scuba diving with history of asthma but he has had no problem. Plan-medications refilled.

## 2022-11-05 NOTE — Assessment & Plan Note (Signed)
Benefits from CPAP with good compliance and control Plan- continue auto 10-20 

## 2022-11-06 ENCOUNTER — Telehealth: Payer: Self-pay | Admitting: Internal Medicine

## 2022-11-06 NOTE — Telephone Encounter (Signed)
Patient called requesting to speak with a nurse regarding his prep for procedure tomorrow, wants to make sure he is doing everything correctly.

## 2022-11-06 NOTE — Telephone Encounter (Signed)
Spoke with pt- states he took Dulcolax 4 tablets yesterday as instructed but didn't take Gatorade.  I asked him to get 32 oz of Gatorade and 119 gram bottle of Miralax OTC- mix that and drink 1 8ounce glass every 15 minutes until completed. I asked him to do this right now and pt states he will.  Push fluids today and then follow instructions for Suprep as per paperwork. Understanding voiced

## 2022-11-07 ENCOUNTER — Encounter: Payer: Self-pay | Admitting: Internal Medicine

## 2022-11-07 ENCOUNTER — Ambulatory Visit (AMBULATORY_SURGERY_CENTER): Payer: Medicare PPO | Admitting: Internal Medicine

## 2022-11-07 VITALS — BP 135/72 | HR 81 | Temp 98.0°F | Resp 15 | Ht 64.0 in | Wt 210.0 lb

## 2022-11-07 DIAGNOSIS — Z09 Encounter for follow-up examination after completed treatment for conditions other than malignant neoplasm: Secondary | ICD-10-CM | POA: Diagnosis not present

## 2022-11-07 DIAGNOSIS — Z1211 Encounter for screening for malignant neoplasm of colon: Secondary | ICD-10-CM | POA: Diagnosis not present

## 2022-11-07 DIAGNOSIS — F32A Depression, unspecified: Secondary | ICD-10-CM | POA: Diagnosis not present

## 2022-11-07 DIAGNOSIS — I10 Essential (primary) hypertension: Secondary | ICD-10-CM | POA: Diagnosis not present

## 2022-11-07 DIAGNOSIS — Z86010 Personal history of colonic polyps: Secondary | ICD-10-CM

## 2022-11-07 DIAGNOSIS — F419 Anxiety disorder, unspecified: Secondary | ICD-10-CM | POA: Diagnosis not present

## 2022-11-07 DIAGNOSIS — G4733 Obstructive sleep apnea (adult) (pediatric): Secondary | ICD-10-CM | POA: Diagnosis not present

## 2022-11-07 MED ORDER — SODIUM CHLORIDE 0.9 % IV SOLN
500.0000 mL | Freq: Once | INTRAVENOUS | Status: DC
Start: 2022-11-07 — End: 2022-11-07

## 2022-11-07 NOTE — Progress Notes (Signed)
Report to PACU, RN, vss, BBS= Clear.  

## 2022-11-07 NOTE — Progress Notes (Signed)
Approx 8 min of abd pressure used [id number]

## 2022-11-07 NOTE — Progress Notes (Signed)
Pt's states no medical or surgical changes since previsit or office visit. 

## 2022-11-07 NOTE — Progress Notes (Signed)
GASTROENTEROLOGY PROCEDURE H&P NOTE   Primary Care Physician: Madelin Headings, MD    Reason for Procedure:   Hx of polyps  Plan:    colonoscopy  Patient is appropriate for endoscopic procedure(s) in the ambulatory ([facility]) setting.  The nature of the procedure, as well as the risks, benefits, and alternatives were carefully and thoroughly reviewed with the patient. Ample time for discussion and questions allowed. The patient understood, was satisfied, and agreed to proceed.     HPI: [name] is a 72 y.o. male who presents for colonoscopy.  Medical history as below.  Tolerated the prep.  No recent chest pain or shortness of breath.  No abdominal pain today.  Past Medical History:  Diagnosis Date   Allergy    Anxiety    Arthritis    knees   Asthma    Depression    Dysrhythmia    RBBB   Elevated PSA    [place] and bx negative 2011   Hypertension    nl cardiolite in 1998   Pneumonia 2005   right hospitalized    Rhinosinusitis    Sleep apnea    wears CPAP    Past Surgical History:  Procedure Laterality Date   COLONOSCOPY  03/09/2013   ECTROPION REPAIR Left    2023   EYE SURGERY Left 04/08/2022   to bring lower eyelid up   FACIAL RECONSTRUCTION SURGERY     1992   HERNIA REPAIR     NASAL SINUS SURGERY     REPLACEMENT TOTAL KNEE  01/2010   left; Dr Charlann Boxer; RIght knee 06-2010   REVERSE SHOULDER ARTHROPLASTY Right 03/14/2022   Procedure: REVERSE SHOULDER ARTHROPLASTY WITH REMOVAL OF ANATOMIC ARTHROPLASTY;  Surgeon: Francena Hanly, MD;  Location: [facility];  Service: Orthopedics;  Laterality: Right;   right shoulder arthroplasty     2018   shoulder atrhroscopic surgery  2011   Right   TOTAL KNEE REVISION Left 12/05/2021   Procedure: TOTAL KNEE REVISION;  Surgeon: [name], MD;  Location: [facility];  Service: Orthopedics;  Laterality: Left;   TOTAL SHOULDER REVISION Right 05/09/2022   Procedure: Revision Right Reverse Shoulder Arthroplasty;  Surgeon: Francena Hanly, MD;   Location: [facility];  Service: Orthopedics;  Laterality: Right;    VENTRAL HERNIA REPAIR  03/2007   rerepair winter 2010   umbilical per pt    Prior to Admission medications   Medication Sig Start Date End Date Taking? Authorizing Provider  amLODipine-benazepril (LOTREL) 5-10 MG capsule TAKE 1 CAPSULE BY MOUTH EVERY DAY 09/07/22  Yes Worthy Rancher B, FNP  Artificial Tear Solution (SOOTHE XP) SOLN Place 1 drop into both eyes 3 (three) times daily as needed (for dryness).   Yes [provider]  augmented betamethasone dipropionate (DIPROLENE-AF) 0.05 % cream APPLY 1 AS DIRECTED TO AFFECTED AREA ONCE A DAY FOR 2 WEEKS   Yes [provider]  Cyanocobalamin (B-12) 1000 MCG SUBL Place 1,000 mcg under the tongue daily.   Yes [provider]  escitalopram (LEXAPRO) 10 MG tablet TAKE 1 TABLET BY MOUTH EVERY DAY 09/07/22  Yes Worthy Rancher B, FNP  fexofenadine (ALLEGRA) 180 MG tablet Take 180 mg by mouth daily.   Yes [provider]  fluticasone (FLONASE) 50 MCG/ACT nasal spray SPRAY 2 SPRAYS INTO EACH NOSTRIL EVERY DAY 07/16/22  Yes Young, Clinton D, MD  tadalafil (CIALIS) 5 MG tablet Take 1 tablet by mouth daily. 08/22/22  Yes [provider]  Robbie Louis  100-50 MCG/ACT AEPB INHALE 1 PUFF BY MOUTH TWICE A DAY 10/07/22  Yes Panosh, [name], MD  Azelastine HCl 137 MCG/SPRAY SOLN PLACE 1-2 SPRAYS INTO BOTH NOSTRILS 2 (TWO) TIMES DAILY AS NEEDED FOR RHINITIS. Patient taking differently: Place 1-2 sprays into both nostrils 2 (two) times daily as needed (for rhinitis). 01/10/22   Jetty Duhamel D, MD  clobetasol (OLUX) 0.05 % topical foam Apply 1 application  topically daily as needed (scalp itching).    [provider]  ibuprofen (ADVIL) 800 MG tablet Take 1 tablet (800 mg total) by mouth every 8 (eight) hours as needed. 05/09/22   Shuford, French Ana, PA-C  Ipratropium-Albuterol (COMBIVENT RESPIMAT) 20-100 MCG/ACT AERS respimat INHALE 2 PUFFS UP TO FOUR TIMES A  DAY AS NEEDED Patient taking differently: Inhale 2 puffs into the lungs 4 (four) times daily as needed for shortness of breath or wheezing. 11/21/21   Jetty Duhamel D, MD  Olopatadine HCl (PATADAY) 0.7 % SOLN Apply to eye.    [provider]  PRESCRIPTION MEDICATION CPAP- At bedtime and during any time of rest    [provider]  triamcinolone cream (KENALOG) 0.1 % Apply 1 application  topically daily as needed (to affected areas- for itching).    [provider]    Current Outpatient Medications  Medication Sig Dispense Refill   amLODipine-benazepril (LOTREL) 5-10 MG capsule TAKE 1 CAPSULE BY MOUTH EVERY DAY 90 capsule 2   Artificial Tear Solution (SOOTHE XP) SOLN Place 1 drop into both eyes 3 (three) times daily as needed (for dryness).     augmented betamethasone dipropionate (DIPROLENE-AF) 0.05 % cream APPLY 1 AS DIRECTED TO AFFECTED AREA ONCE A DAY FOR 2 WEEKS     Cyanocobalamin (B-12) 1000 MCG SUBL Place 1,000 mcg under the tongue daily.     escitalopram (LEXAPRO) 10 MG tablet TAKE 1 TABLET BY MOUTH EVERY DAY 90 tablet 2   fexofenadine (ALLEGRA) 180 MG tablet Take 180 mg by mouth daily.     fluticasone (FLONASE) 50 MCG/ACT nasal spray SPRAY 2 SPRAYS INTO EACH NOSTRIL EVERY DAY 16 mL 12   tadalafil (CIALIS) 5 MG tablet Take 1 tablet by mouth daily.     WIXELA INHUB 100-50 MCG/ACT AEPB INHALE 1 PUFF BY MOUTH TWICE A DAY 60 each 6   Azelastine HCl 137 MCG/SPRAY SOLN PLACE 1-2 SPRAYS INTO BOTH NOSTRILS 2 (TWO) TIMES DAILY AS NEEDED FOR RHINITIS. (Patient taking differently: Place 1-2 sprays into both nostrils 2 (two) times daily as needed (for rhinitis).) 137 mL 12   clobetasol (OLUX) 0.05 % topical foam Apply 1 application  topically daily as needed (scalp itching).     ibuprofen (ADVIL) 800 MG tablet Take 1 tablet (800 mg total) by mouth every 8 (eight) hours as needed. 90 tablet 2   Ipratropium-Albuterol (COMBIVENT RESPIMAT) 20-100 MCG/ACT AERS respimat INHALE 2  PUFFS UP TO FOUR TIMES A DAY AS NEEDED (Patient taking differently: Inhale 2 puffs into the lungs 4 (four) times daily as needed for shortness of breath or wheezing.) 4 g 11   Olopatadine HCl (PATADAY) 0.7 % SOLN Apply to eye.     PRESCRIPTION MEDICATION CPAP- At bedtime and during any time of rest     triamcinolone cream (KENALOG) 0.1 % Apply 1 application  topically daily as needed (to affected areas- for itching).     Current Facility-Administered Medications  Medication Dose Route Frequency Provider Last Rate Last Admin   0.9 %  sodium chloride infusion  500 mL  Intravenous Once , Carie Caddy, MD        Allergies as of 11/07/2022 - Review Complete 11/07/2022  Allergen Reaction Noted   Tape Other (See Comments) 07/08/2019   Egg-derived products Diarrhea, Nausea Only, and Other (See Comments) 02/10/2013    Family History  Problem Relation Age of Onset   Heart failure Father        MV replacement   Myelodysplastic syndrome Father    Asthma Mother    Polycythemia Brother    Asthma Other    Hypertension Other    Diabetes Maternal Grandmother    Healthy Sister    Healthy Brother    Healthy Brother    Colon cancer Neg Hx    Pancreatic cancer Neg Hx    Stomach cancer Neg Hx    Esophageal cancer Neg Hx    Colon polyps Neg Hx    Rectal cancer Neg Hx     Social History   Socioeconomic History   Marital status: Single    Spouse name: Not on file   Number of children: Not on file   Years of education: Not on file   Highest education level: Bachelor's degree (e.g., BA, AB, BS)  Occupational History   Occupation: Higher education careers adviser    Comment: 40 hours  Tobacco Use   Smoking status: Never   Smokeless tobacco: Never  Vaping Use   Vaping status: Never Used  Substance and Sexual Activity   Alcohol use: No    Comment: Quit drinking ETOH 1986   Drug use: No   Sexual activity: Not Currently  Other Topics Concern   Not on file  Social History Narrative    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   Stopped drinking in 1986    Nonosmoker    Single   HHof 1    Social Determinants of Health   Financial Resource Strain: Low Risk  (06/09/2022)   Overall Financial Resource Strain (CARDIA)    Difficulty of Paying Living Expenses: Not hard at all  Food Insecurity: No Food Insecurity (07/18/2022)   Hunger Vital Sign    Worried About Running Out of Food in the Last Year: Never true    Ran Out of Food in the Last Year: Never true  Transportation Needs: No Transportation Needs (06/09/2022)   PRAPARE - Administrator, Civil Service (Medical): No    Lack of Transportation (Non-Medical): No  Physical Activity: Insufficiently Active (06/09/2022)   Exercise Vital Sign    Days of Exercise per Week: 2 days    Minutes of Exercise per Session: 60 min  Stress: No Stress Concern Present (06/09/2022)   [employer] of Occupational Health - Occupational Stress Questionnaire    Feeling of Stress : Not at all  Social Connections: Moderately Isolated (06/09/2022)   Social Connection and Isolation Panel [NHANES]    Frequency of Communication with Friends and Family: More than three times a week    Frequency of Social Gatherings with Friends and Family: More than three times a week    Attends Religious Services: Never    Database administrator or Organizations: Yes    Attends Engineer, structural: More than 4 times per year    Marital Status: Divorced  Intimate Partner Violence: Not At Risk (06/09/2022)   Humiliation, Afraid, Rape, and Kick questionnaire    Fear of Current or Ex-Partner: No    Emotionally Abused: No    Physically Abused: No  Sexually Abused: No    Physical Exam: Vital signs in last 24 hours: @BP  (!) 151/87   Pulse 70   Temp 98 F (36.7 C) (Temporal)   Ht 5\' 4"  (1.626 m)   Wt 210 lb (95.3 kg)   SpO2 99%   BMI 36.05 kg/m  GEN: NAD EYE: Sclerae anicteric ENT: MMM CV: Non-tachycardic Pulm: CTA b/l GI: Soft,  NT/ND NEURO:  Alert & Oriented x 3   Erick Blinks, MD [facility]  11/07/2022 1:37 PM

## 2022-11-07 NOTE — Patient Instructions (Signed)
Resume all of your regular medications today.   Read all of your discharge instructions.  YOU HAD AN ENDOSCOPIC PROCEDURE TODAY AT THE [facility]:   Refer to the procedure report that was given to you for any specific questions about what was found during the examination.  If the procedure report does not answer your questions, please call your gastroenterologist to clarify.  If you requested that your care partner not be given the details of your procedure findings, then the procedure report has been included in a sealed envelope for you to review at your convenience later.  YOU SHOULD EXPECT: Some feelings of bloating in the abdomen. Passage of more gas than usual.  Walking can help get rid of the air that was put into your GI tract during the procedure and reduce the bloating. If you had a lower endoscopy (such as a colonoscopy or flexible sigmoidoscopy) you may notice spotting of blood in your stool or on the toilet paper. If you underwent a bowel prep for your procedure, you may not have a normal bowel movement for a few days.  Please Note:  You might notice some irritation and congestion in your nose or some drainage.  This is from the oxygen used during your procedure.  There is no need for concern and it should clear up in a day or so.  SYMPTOMS TO REPORT IMMEDIATELY:  Following lower endoscopy (colonoscopy or flexible sigmoidoscopy):  Excessive amounts of blood in the stool  Significant tenderness or worsening of abdominal pains  Swelling of the abdomen that is new, acute  Fever of 100F or higher  For urgent or emergent issues, a gastroenterologist can be reached at any hour by calling [phone]. Do not use MyChart messaging for urgent concerns.    DIET:  We do recommend a small meal at first, but then you may proceed to your regular diet.  Drink plenty of fluids but you should avoid alcoholic beverages for 24 hours. Try to eat a high fiber diet, and drink plenty of  water.  ACTIVITY:  You should plan to take it easy for the rest of today and you should NOT DRIVE or use heavy machinery until tomorrow (because of the sedation medicines used during the test).    FOLLOW UP: Our staff will call the number listed on your records the next business day following your procedure.  We will call around 7:15- 8:00 am to check on you and address any questions or concerns that you may have regarding the information given to you following your procedure. If we do not reach you, we will leave a message.     If any biopsies were taken you will be contacted by phone or by letter within the next 1-3 weeks.  Please call us at [phone] if you have not heard about the biopsies in 3 weeks.    SIGNATURES/CONFIDENTIALITY: You and/or your care partner have signed paperwork which will be entered into your electronic medical record.  These signatures attest to the fact that that the information above on your After Visit Summary has been reviewed and is understood.  Full responsibility of the confidentiality of this discharge information lies with you and/or your care-partner.

## 2022-11-07 NOTE — Op Note (Signed)
[facility] Patient Name: [name] Procedure Date: 11/07/2022 1:39 PM MRN: [id number] Endoscopist: Beverley Fiedler , MD, [id number] Age: 72 Referring MD:  Date of Birth: 01-Jan-1951 Gender: Male Account #: [id number] Procedure:                Colonoscopy Indications:              High risk colon cancer surveillance: Personal                            history of sessile serrated colon polyps with no                            dysplasia and adenomas, Last colonoscopy: March                            2021 (3 adenomas, 1 SSP) Medicines:                Monitored Anesthesia Care Procedure:                Pre-Anesthesia Assessment:                           - Prior to the procedure, a History and Physical                            was performed, and patient medications and                            allergies were reviewed. The patient's tolerance of                            previous anesthesia was also reviewed. The risks                            and benefits of the procedure and the sedation                            options and risks were discussed with the patient.                            All questions were answered, and informed consent                            was obtained. Prior Anticoagulants: The patient has                            taken no anticoagulant or antiplatelet agents. ASA                            Grade Assessment: II - A patient with mild systemic                            disease. After reviewing the risks and benefits,  the patient was deemed in satisfactory condition to                            undergo the procedure.                           After obtaining informed consent, the colonoscope                            was passed under direct vision. Throughout the                            procedure, the patient's blood pressure, pulse, and                            oxygen saturations were monitored  continuously. The                            CF [id number] #[id number] was introduced through the anus                            and advanced to the cecum, identified by the                            appendiceal orifice. The colonoscopy was performed                            without difficulty. The patient tolerated the                            procedure well. The quality of the bowel                            preparation was good with 2 day prep. The ileocecal                            valve, appendiceal orifice, and rectum were                            photographed. Scope In: 1:44:40 PM Scope Out: 2:03:06 PM Scope Withdrawal Time: 0 hours 7 minutes 31 seconds  Total Procedure Duration: 0 hours 18 minutes 26 seconds  Findings:                 The digital rectal exam was normal.                           The colon (entire examined portion) was                            significantly redundant.                           The colon (entire examined portion) appeared normal.  Internal hemorrhoids were found during                            retroflexion. The hemorrhoids were small. Complications:            No immediate complications. Estimated Blood Loss:     Estimated blood loss: none. Impression:               - Redundant colon.                           - The entire examined colon is normal.                           - Internal hemorrhoids.                           - No specimens collected. Recommendation:           - Patient has a contact number available for                            emergencies. The signs and symptoms of potential                            delayed complications were discussed with the                            patient. Return to normal activities tomorrow.                            Written discharge instructions were provided to the                            patient.                           - Resume previous diet.                            - Continue present medications.                           - Repeat colonoscopy in 5 years for surveillance; 2                            day prep. Beverley Fiedler, MD 11/07/2022 2:08:43 PM This report has been signed electronically.

## 2022-11-11 ENCOUNTER — Telehealth: Payer: Self-pay

## 2022-11-11 NOTE — Telephone Encounter (Signed)
  Follow up Call-     11/07/2022   12:46 PM  Call back number  Post procedure Call Back phone  # [phone]  Permission to leave phone message Yes     Patient questions:  Do you have a fever, pain , or abdominal swelling? No. Pain Score  0 *  Have you tolerated food without any problems? Yes.    Have you been able to return to your normal activities? Yes.    Do you have any questions about your discharge instructions: Diet   No. Medications  No. Follow up visit  No.  Do you have questions or concerns about your Care? No.  Actions: * If pain score is 4 or above: No action needed, pain <4.

## 2022-11-13 DIAGNOSIS — T8141XA Infection following a procedure, superficial incisional surgical site, initial encounter: Secondary | ICD-10-CM | POA: Diagnosis not present

## 2022-11-13 DIAGNOSIS — T8131XD Disruption of external operation (surgical) wound, not elsewhere classified, subsequent encounter: Secondary | ICD-10-CM | POA: Diagnosis not present

## 2022-11-13 DIAGNOSIS — H0279 Other degenerative disorders of eyelid and periocular area: Secondary | ICD-10-CM | POA: Diagnosis not present

## 2022-11-13 DIAGNOSIS — Z09 Encounter for follow-up examination after completed treatment for conditions other than malignant neoplasm: Secondary | ICD-10-CM | POA: Diagnosis not present

## 2022-12-08 ENCOUNTER — Encounter: Payer: Self-pay | Admitting: Internal Medicine

## 2022-12-08 NOTE — Telephone Encounter (Signed)
Attempted to reach pt. Left a voicemail to call us back.  

## 2022-12-08 NOTE — Telephone Encounter (Signed)
Spoke to pt. Schedule an appt with Dr. Clent Ridges tomorrow at 2:30pm.

## 2022-12-09 ENCOUNTER — Ambulatory Visit: Payer: Medicare PPO | Admitting: Family Medicine

## 2022-12-09 VITALS — BP 126/74 | HR 56 | Temp 98.0°F | Wt 204.6 lb

## 2022-12-09 DIAGNOSIS — L03115 Cellulitis of right lower limb: Secondary | ICD-10-CM

## 2022-12-09 MED ORDER — DOXYCYCLINE HYCLATE 100 MG PO TABS: 100.0000 mg | ORAL_TABLET | Freq: Two times a day (BID) | ORAL | 0 refills | Status: DC

## 2022-12-09 NOTE — Progress Notes (Signed)
   Subjective:    Patient ID: [name], male    DOB: Jul 02, 1950, 72 y.o.   MRN: [id number]  HPI Here for 3 days of redness and mild pain in the right lower leg. He normally has lower leg edema and he wears compression stockings. About a week ago while he was carrying a container a sharp corner struck his right lower leg. This made a small hole, and clear fluid drained from this for several days. Then the redness appeared. He has never had this before. He has not had any fever.    Review of Systems  Constitutional: Negative.  Negative for fever.  Respiratory: Negative.    Cardiovascular:  Positive for leg swelling. Negative for chest pain and palpitations.  Gastrointestinal: Negative.   Skin:  Positive for color change.       Objective:   Physical Exam Constitutional:      Appearance: Normal appearance.  Cardiovascular:     Rate and Rhythm: Normal rate and regular rhythm.     Pulses: Normal pulses.     Heart sounds: Normal heart sounds.  Pulmonary:     Effort: Pulmonary effort is normal.     Breath sounds: Normal breath sounds.  Musculoskeletal:     Comments: Left lower leg has 1+ edema. The right lower leg has 3+ edema. The entire anterior portion of the right lower leg is red, warm, and tender to the touch. There is a tiny apparent puncture wound on the medial lower leg above the ankle   Neurological:     Mental Status: He is alert.           Assessment & Plan:  He had a small puncture wound that has led to a cellulitis. We will treat with 14 days of Doxycycline. Recheck as needed. Gershon Crane, MD

## 2022-12-17 ENCOUNTER — Ambulatory Visit: Payer: Medicare PPO | Admitting: Family Medicine

## 2022-12-17 ENCOUNTER — Encounter: Payer: Self-pay | Admitting: Family Medicine

## 2022-12-17 VITALS — BP 124/72 | HR 59 | Temp 97.9°F | Wt 204.0 lb

## 2022-12-17 DIAGNOSIS — L03115 Cellulitis of right lower limb: Secondary | ICD-10-CM | POA: Diagnosis not present

## 2022-12-17 DIAGNOSIS — M7989 Other specified soft tissue disorders: Secondary | ICD-10-CM

## 2022-12-17 MED ORDER — CLINDAMYCIN HCL 300 MG PO CAPS: 300.0000 mg | ORAL_CAPSULE | Freq: Four times a day (QID) | ORAL | 0 refills | Status: DC

## 2022-12-17 MED ORDER — DOXYCYCLINE HYCLATE 100 MG PO TABS: 100.0000 mg | ORAL_TABLET | Freq: Two times a day (BID) | ORAL | 0 refills | Status: DC

## 2022-12-17 NOTE — Progress Notes (Signed)
Subjective:    Patient ID: Jadian Droz Corlew, male    DOB: [birth year], 72 y.o.   MRN: [id number]  HPI Here to follow up a visit on 12-09-22 for swelling and redness in the right lower leg after he got a small puncture wound. We started him on Doxycyline, and he thinks the redness has faded a bit, but the swelling remains. He has never had a fever with this.    Review of Systems  Constitutional: Negative.   Respiratory: Negative.    Cardiovascular:  Positive for leg swelling.       Objective:   Physical Exam Constitutional:      General: He is not in acute distress.    Appearance: Normal appearance.  Cardiovascular:     Rate and Rhythm: Normal rate and regular rhythm.     Pulses: Normal pulses.     Heart sounds: Normal heart sounds.  Pulmonary:     Effort: Pulmonary effort is normal.     Breath sounds: Normal breath sounds.  Musculoskeletal:     Comments: Right lower leg is still swollen about the same as at our last visit, but the erythema has faded somewhat. He is mildly tender from the middle lower leg down to the foot, but there is no calf tenderness. No cords felt.   Neurological:     Mental Status: He is alert.           Assessment & Plan:  Cellulitis. We will continue him on the Doxycycline and we will add Clindamycin 300 mg QID. We will get a venous doppler to rule out any DVT. Gershon Crane, MD

## 2022-12-18 ENCOUNTER — Ambulatory Visit (HOSPITAL_BASED_OUTPATIENT_CLINIC_OR_DEPARTMENT_OTHER): Payer: Medicare PPO

## 2022-12-18 ENCOUNTER — Telehealth: Payer: Self-pay

## 2022-12-18 DIAGNOSIS — M7989 Other specified soft tissue disorders: Secondary | ICD-10-CM | POA: Diagnosis not present

## 2022-12-18 NOTE — Telephone Encounter (Signed)
Spoke with [name] Client with [employer] regarding to DVT    She states pt is negative for DTV.

## 2022-12-19 NOTE — Telephone Encounter (Signed)
Excellent. Thanks!

## 2022-12-24 ENCOUNTER — Encounter: Payer: Self-pay | Admitting: Internal Medicine

## 2022-12-24 ENCOUNTER — Encounter: Payer: Self-pay | Admitting: Family Medicine

## 2022-12-25 MED ORDER — MUPIROCIN 2 % EX OINT: 1.0000 | TOPICAL_OINTMENT | Freq: Three times a day (TID) | CUTANEOUS | 3 refills | Status: DC

## 2022-12-25 NOTE — Telephone Encounter (Signed)
I was hoping this would be better by now. He should keep taking both antibiotics, but also call in Mupiricin ointment, to apply TID to the affected skin. 30 grams with 3 rf

## 2022-12-26 ENCOUNTER — Ambulatory Visit: Payer: Medicare PPO | Admitting: Family Medicine

## 2022-12-26 ENCOUNTER — Encounter: Payer: Self-pay | Admitting: Family Medicine

## 2022-12-26 VITALS — BP 120/78 | HR 55 | Temp 98.6°F | Wt 206.0 lb

## 2022-12-26 DIAGNOSIS — L03115 Cellulitis of right lower limb: Secondary | ICD-10-CM

## 2022-12-26 MED ORDER — CEFTRIAXONE SODIUM 1 G IJ SOLR
1.0000 g | Freq: Once | INTRAMUSCULAR | Status: AC
Start: 2022-12-26 — End: 2022-12-26
  Administered 2022-12-26: 1 g via INTRAMUSCULAR

## 2022-12-26 MED ORDER — CIPROFLOXACIN HCL 500 MG PO TABS: 500.0000 mg | ORAL_TABLET | Freq: Two times a day (BID) | ORAL | 0 refills | Status: DC

## 2022-12-26 NOTE — Progress Notes (Signed)
Subjective:    Patient ID: [name], male    DOB: May 22, 1950, 72 y.o.   MRN: [id number]  HPI Here to follow up on cellulitis in the right lower leg. We saw him on 12-09-22 and we started him on Doxycycline. We then saw him again on 12-17-22 and we added Clindamycin. We also sent him for a venous doppler that was negative for a DVT. Today he says he is not any better at all. The swelling and tenderness persist. It does not seem to be moving up his leg fortunately. No fever.    Review of Systems  Constitutional: Negative.   Respiratory: Negative.    Cardiovascular: Negative.   Skin:  Positive for color change.       Objective:   Physical Exam Constitutional:      Appearance: Normal appearance. He is not ill-appearing.  Cardiovascular:     Rate and Rhythm: Normal rate and regular rhythm.     Pulses: Normal pulses.     Heart sounds: Normal heart sounds.  Pulmonary:     Effort: Pulmonary effort is normal.     Breath sounds: Normal breath sounds.  Musculoskeletal:     Comments: The right lower leg and foot are swollen, red, warm, and tender. There has been little change from our last exam.   Neurological:     Mental Status: He is alert.           Assessment & Plan:  Cellulitis. We will stop the Doxycycline and Clindamycin, and instead he will start 10 days of Cipro 500 mg BID. He is given a shot of Rocephin. We will also refer him urgently to Infectious Disease evaluate further.  Gershon Crane, MD

## 2022-12-26 NOTE — Addendum Note (Signed)
Addended by: Carola Rhine on: 12/26/2022 10:28 AM   Modules accepted: Orders

## 2022-12-26 NOTE — Telephone Encounter (Signed)
He was seen OV today  

## 2022-12-31 ENCOUNTER — Other Ambulatory Visit: Payer: Self-pay

## 2022-12-31 ENCOUNTER — Ambulatory Visit: Payer: Medicare PPO | Admitting: Internal Medicine

## 2022-12-31 ENCOUNTER — Encounter: Payer: Self-pay | Admitting: Internal Medicine

## 2022-12-31 VITALS — BP 113/71 | HR 54 | Temp 98.1°F | Wt 207.0 lb

## 2022-12-31 DIAGNOSIS — R600 Localized edema: Secondary | ICD-10-CM | POA: Diagnosis not present

## 2022-12-31 NOTE — Progress Notes (Signed)
[facility]      Reason for Consult: cellulitis    Referring Physician: Dr. Clent Ridges    Patient ID: [name], male    DOB: [birth year], 72 y.o.   MRN: [id number]  HPI:   [name] is here for evaluation of edema of his leg.   He initially presented to his PCP for redness of the right lower leg 9/10 after a puncture from a sharp corner.  No purulence reported and he was started on doxycycline for 14 days. He then returned on 9/18 with some improvement but no resolution and added clindamycin at 300 mg 4 times a day.  No allergies reported.  Then returned 9/27 with report of no improvement and given cipro.  Also a dose ceftriaxone.    Past Medical History:  Diagnosis Date   Allergy    Anxiety    Arthritis    knees   Asthma    Depression    Dysrhythmia    RBBB   Elevated PSA    [place] and bx negative 2011   Hypertension    nl cardiolite in 1998   Pneumonia 2005   right hospitalized    Rhinosinusitis    Sleep apnea    wears CPAP    Prior to Admission medications   Medication Sig Start Date End Date Taking? Authorizing Provider  amLODipine-benazepril (LOTREL) 5-10 MG capsule TAKE 1 CAPSULE BY MOUTH EVERY DAY 09/07/22   Worthy Rancher B, FNP  Artificial Tear Solution (SOOTHE XP) SOLN Place 1 drop into both eyes 3 (three) times daily as needed (for dryness).    [provider]  augmented betamethasone dipropionate (DIPROLENE-AF) 0.05 % cream APPLY 1 AS DIRECTED TO AFFECTED AREA ONCE A DAY FOR 2 WEEKS    [provider]  Azelastine HCl 137 MCG/SPRAY SOLN PLACE 1-2 SPRAYS INTO BOTH NOSTRILS 2 (TWO) TIMES DAILY AS NEEDED FOR RHINITIS. Patient taking differently: Place 1-2 sprays into both nostrils 2 (two) times daily as needed (for rhinitis). 01/10/22   Jetty Duhamel D, MD  ciprofloxacin (CIPRO) 500 MG tablet Take 1 tablet (500 mg total) by mouth 2 (two) times daily for 10 days. 12/26/22 01/05/23  Nelwyn Salisbury, MD  clobetasol (OLUX) 0.05 %  topical foam Apply 1 application  topically daily as needed (scalp itching).    [provider]  Cyanocobalamin (B-12) 1000 MCG SUBL Place 1,000 mcg under the tongue daily.    [provider]  escitalopram (LEXAPRO) 10 MG tablet TAKE 1 TABLET BY MOUTH EVERY DAY 09/07/22   Worthy Rancher B, FNP  fexofenadine (ALLEGRA) 180 MG tablet Take 180 mg by mouth daily.    [provider]  fluticasone (FLONASE) 50 MCG/ACT nasal spray SPRAY 2 SPRAYS INTO EACH NOSTRIL EVERY DAY 07/16/22   Jetty Duhamel D, MD  ibuprofen (ADVIL) 800 MG tablet Take 1 tablet (800 mg total) by mouth every 8 (eight) hours as needed. 05/09/22   Shuford, French Ana, PA-C  Ipratropium-Albuterol (COMBIVENT RESPIMAT) 20-100 MCG/ACT AERS respimat INHALE 2 PUFFS UP TO FOUR TIMES A DAY AS NEEDED Patient taking differently: Inhale 2 puffs into the lungs 4 (four) times daily as needed for shortness of breath or wheezing. 11/21/21   Waymon Budge, MD  mupirocin ointment (BACTROBAN) 2 % Apply 1 Application topically 3 (three) times daily. 12/25/22   Nelwyn Salisbury, MD  Olopatadine HCl (PATADAY) 0.7 % SOLN Apply to eye.    [provider]  PRESCRIPTION MEDICATION CPAP- At  bedtime and during any time of rest    [provider]  tadalafil (CIALIS) 5 MG tablet Take 1 tablet by mouth daily. 08/22/22   [provider]  triamcinolone cream (KENALOG) 0.1 % Apply 1 application  topically daily as needed (to affected areas- for itching).    [provider]  Monte Fantasia INHUB 100-50 MCG/ACT AEPB INHALE 1 PUFF BY MOUTH TWICE A DAY 10/07/22   Panosh, [name], MD    Allergies  Allergen Reactions   Tape Other (See Comments)    Causes bleeding and redness   Egg-Derived Products Diarrhea, Nausea Only and Other (See Comments)    Terrible stomach pain    Social History   Tobacco Use   Smoking status: Never   Smokeless tobacco: Never  Vaping Use   Vaping status: Never Used  Substance Use Topics   Alcohol  use: No    Comment: Quit drinking ETOH 1986   Drug use: No    Family History  Problem Relation Age of Onset   Heart failure Father        MV replacement   Myelodysplastic syndrome Father    Asthma Mother    Polycythemia Brother    Asthma Other    Hypertension Other    Diabetes Maternal Grandmother    Healthy Sister    Healthy Brother    Healthy Brother    Colon cancer Neg Hx    Pancreatic cancer Neg Hx    Stomach cancer Neg Hx    Esophageal cancer Neg Hx    Colon polyps Neg Hx    Rectal cancer Neg Hx     Review of Systems  Constitutional: negative for fevers and chills All other systems reviewed and are negative    Constitutional: in no apparent distress There were no vitals filed for this visit. EYES: anicteric Respiratory: normal respiratory effort Musculoskeletal: right foot and lower leg with edema.  No erythema, no warmth, no tenderness.    Labs: Lab Results  Component Value Date   WBC 8.3 05/07/2022   HGB 13.4 05/07/2022   HCT 39.4 05/07/2022   MCV 107.7 (H) 05/07/2022   PLT 174 05/07/2022    Lab Results  Component Value Date   CREATININE 0.66 05/07/2022   BUN 12 05/07/2022   NA 136 05/07/2022   K 4.5 05/07/2022   CL 103 05/07/2022   CO2 26 05/07/2022    Lab Results  Component Value Date   ALT 20 11/06/2021   AST 18 11/06/2021   ALKPHOS 65 11/06/2021   BILITOT 0.8 11/06/2021   INR 1.0 12/24/2020     Assessment: leg edema.  Unclear cause.  No DVT, no signs of infection on exam with no erythema, warmth or tenderness at this time.  No indication for antibiotics  Plan: 1)  stop antibiotics 2) leg elevation, compression stocking, increase activity to improve blood flow Follow up with your PCP for leg edema.

## 2022-12-31 NOTE — Patient Instructions (Signed)
Good to see you [name].  Try to keep your leg elevated when possible, use a compression stocking and ok to exercise.

## 2023-01-16 ENCOUNTER — Telehealth: Payer: Self-pay | Admitting: Internal Medicine

## 2023-01-16 MED ORDER — COMBIVENT RESPIMAT 20-100 MCG/ACT IN AERS: INHALATION_SPRAY | RESPIRATORY_TRACT | 11 refills | Status: DC

## 2023-01-16 NOTE — Telephone Encounter (Signed)
[facility] requests refill combivent

## 2023-01-19 DIAGNOSIS — G4733 Obstructive sleep apnea (adult) (pediatric): Secondary | ICD-10-CM | POA: Diagnosis not present

## 2023-01-23 ENCOUNTER — Telehealth: Payer: Self-pay | Admitting: Internal Medicine

## 2023-01-23 MED ORDER — COMBIVENT RESPIMAT 20-100 MCG/ACT IN AERS: INHALATION_SPRAY | RESPIRATORY_TRACT | 11 refills | Status: DC

## 2023-01-23 NOTE — Telephone Encounter (Signed)
[facility] asked refill combivent

## 2023-02-04 DIAGNOSIS — H02413 Mechanical ptosis of bilateral eyelids: Secondary | ICD-10-CM | POA: Diagnosis not present

## 2023-02-04 DIAGNOSIS — H0289 Other specified disorders of eyelid: Secondary | ICD-10-CM | POA: Diagnosis not present

## 2023-02-04 DIAGNOSIS — Z09 Encounter for follow-up examination after completed treatment for conditions other than malignant neoplasm: Secondary | ICD-10-CM | POA: Diagnosis not present

## 2023-02-04 DIAGNOSIS — H0279 Other degenerative disorders of eyelid and periocular area: Secondary | ICD-10-CM | POA: Diagnosis not present

## 2023-02-10 DIAGNOSIS — Z09 Encounter for follow-up examination after completed treatment for conditions other than malignant neoplasm: Secondary | ICD-10-CM | POA: Diagnosis not present

## 2023-02-10 DIAGNOSIS — H0279 Other degenerative disorders of eyelid and periocular area: Secondary | ICD-10-CM | POA: Diagnosis not present

## 2023-02-10 DIAGNOSIS — H019 Unspecified inflammation of eyelid: Secondary | ICD-10-CM | POA: Diagnosis not present

## 2023-02-10 DIAGNOSIS — H02413 Mechanical ptosis of bilateral eyelids: Secondary | ICD-10-CM | POA: Diagnosis not present

## 2023-02-10 DIAGNOSIS — H0289 Other specified disorders of eyelid: Secondary | ICD-10-CM | POA: Diagnosis not present

## 2023-02-10 NOTE — Progress Notes (Unsigned)
No chief complaint on file.   HPI: Patient  [name]  72 y.o. comes in today for Preventive Health Care visit  And Chronic disease management  HT amlod benazapril Lexapro  Respiratiry     Health Maintenance  Topic Date Due   DTaP/Tdap/Td (2 - Tdap) 09/18/2019   COVID-19 Vaccine (8 - 2023-24 season) 01/22/2023   Medicare Annual Wellness (AWV)  06/09/2023   Colonoscopy  11/07/2027   Pneumonia Vaccine 30+ Years old  Completed   INFLUENZA VACCINE  Completed   Hepatitis C Screening  Completed   Zoster Vaccines- Shingrix  Completed   HPV VACCINES  Aged Out   Health Maintenance Review LIFESTYLE:  Exercise:   Tobacco/ETS: Alcohol:  Sugar beverages: Sleep: Drug use: no HH of  Work:    ROS:  GEN/ HEENT: No fever, significant weight changes sweats headaches vision problems hearing changes, CV/ PULM; No chest pain shortness of breath cough, syncope,edema  change in exercise tolerance. GI /GU: No adominal pain, vomiting, change in bowel habits. No blood in the stool. No significant GU symptoms. SKIN/HEME: ,no acute skin rashes suspicious lesions or bleeding. No lymphadenopathy, nodules, masses.  NEURO/ PSYCH:  No neurologic signs such as weakness numbness. No depression anxiety. IMM/ Allergy: No unusual infections.  Allergy .   REST of 12 system review negative except as per HPI   Past Medical History:  Diagnosis Date   Allergy    Anxiety    Arthritis    knees   Asthma    Depression    Dysrhythmia    RBBB   Elevated PSA    [place] and bx negative 2011   Hypertension    nl cardiolite in 1998   Pneumonia 2005   right hospitalized    Rhinosinusitis    Sleep apnea    wears CPAP    Past Surgical History:  Procedure Laterality Date   COLONOSCOPY  03/09/2013   ECTROPION REPAIR Left    2023   EYE SURGERY Left 04/08/2022   to bring lower eyelid up   FACIAL RECONSTRUCTION SURGERY     1992   HERNIA REPAIR     NASAL SINUS SURGERY     REPLACEMENT TOTAL KNEE   01/2010   left; Dr Charlann Boxer; RIght knee 06-2010   REVERSE SHOULDER ARTHROPLASTY Right 03/14/2022   Procedure: REVERSE SHOULDER ARTHROPLASTY WITH REMOVAL OF ANATOMIC ARTHROPLASTY;  Surgeon: Francena Hanly, MD;  Location: [facility];  Service: Orthopedics;  Laterality: Right;   right shoulder arthroplasty     2018   shoulder atrhroscopic surgery  2011   Right   TOTAL KNEE REVISION Left 12/05/2021   Procedure: TOTAL KNEE REVISION;  Surgeon: [name], MD;  Location: [facility];  Service: Orthopedics;  Laterality: Left;   TOTAL SHOULDER REVISION Right 05/09/2022   Procedure: Revision Right Reverse Shoulder Arthroplasty;  Surgeon: Francena Hanly, MD;  Location: [facility];  Service: Orthopedics;  Laterality: Right;    VENTRAL HERNIA REPAIR  03/2007   rerepair winter 2010   umbilical per pt    Family History  Problem Relation Age of Onset   Heart failure Father        MV replacement   Myelodysplastic syndrome Father    Asthma Mother    Polycythemia Brother    Asthma Other    Hypertension Other    Diabetes Maternal Grandmother    Healthy Sister    Healthy Brother    Healthy Brother    Colon cancer Neg Hx  Pancreatic cancer Neg Hx    Stomach cancer Neg Hx    Esophageal cancer Neg Hx    Colon polyps Neg Hx    Rectal cancer Neg Hx     Social History   Socioeconomic History   Marital status: Single    Spouse name: Not on file   Number of children: Not on file   Years of education: Not on file   Highest education level: Bachelor's degree (e.g., BA, AB, BS)  Occupational History   Occupation: Higher education careers adviser    Comment: 40 hours  Tobacco Use   Smoking status: Never   Smokeless tobacco: Never  Vaping Use   Vaping status: Never Used  Substance and Sexual Activity   Alcohol use: No    Comment: Quit drinking ETOH 1986   Drug use: No   Sexual activity: Not Currently  Other Topics Concern   Not on file  Social History Narrative    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   Stopped drinking in 1986    Nonosmoker    Single   HHof 1    Social Determinants of Health   Financial Resource Strain: Low Risk  (12/09/2022)   Overall Financial Resource Strain (CARDIA)    Difficulty of Paying Living Expenses: Not very hard  Food Insecurity: No Food Insecurity (12/09/2022)   Hunger Vital Sign    Worried About Running Out of Food in the Last Year: Never true    Ran Out of Food in the Last Year: Never true  Transportation Needs: No Transportation Needs (12/09/2022)   PRAPARE - Administrator, Civil Service (Medical): No    Lack of Transportation (Non-Medical): No  Physical Activity: Sufficiently Active (12/09/2022)   Exercise Vital Sign    Days of Exercise per Week: 4 days    Minutes of Exercise per Session: 60 min  Stress: No Stress Concern Present (12/09/2022)   [employer] of Occupational Health - Occupational Stress Questionnaire    Feeling of Stress : Not at all  Social Connections: Moderately Isolated (12/09/2022)   Social Connection and Isolation Panel [NHANES]    Frequency of Communication with Friends and Family: More than three times a week    Frequency of Social Gatherings with Friends and Family: Twice a week    Attends Religious Services: Never    Database administrator or Organizations: Yes    Attends Engineer, structural: More than 4 times per year    Marital Status: Divorced    Outpatient Medications Prior to Visit  Medication Sig Dispense Refill   amLODipine-benazepril (LOTREL) 5-10 MG capsule TAKE 1 CAPSULE BY MOUTH EVERY DAY 90 capsule 2   Artificial Tear Solution (SOOTHE XP) SOLN Place 1 drop into both eyes 3 (three) times daily as needed (for dryness).     augmented betamethasone dipropionate (DIPROLENE-AF) 0.05 % cream APPLY 1 AS DIRECTED TO AFFECTED AREA ONCE A DAY FOR 2 WEEKS     Azelastine HCl 137 MCG/SPRAY SOLN PLACE 1-2 SPRAYS INTO BOTH NOSTRILS 2 (TWO) TIMES DAILY AS NEEDED FOR RHINITIS. 137 mL 12    clobetasol (OLUX) 0.05 % topical foam Apply 1 application  topically daily as needed (scalp itching).     Cyanocobalamin (B-12) 1000 MCG SUBL Place 1,000 mcg under the tongue daily.     escitalopram (LEXAPRO) 10 MG tablet TAKE 1 TABLET BY MOUTH EVERY DAY 90 tablet 2   fexofenadine (ALLEGRA) 180 MG tablet Take 180 mg by mouth  daily.     fluticasone (FLONASE) 50 MCG/ACT nasal spray SPRAY 2 SPRAYS INTO EACH NOSTRIL EVERY DAY 16 mL 12   ibuprofen (ADVIL) 800 MG tablet Take 1 tablet (800 mg total) by mouth every 8 (eight) hours as needed. 90 tablet 2   Ipratropium-Albuterol (COMBIVENT RESPIMAT) 20-100 MCG/ACT AERS respimat Inhale 1 puff up to four times daily as needed 4 g 11   mupirocin ointment (BACTROBAN) 2 % Apply 1 Application topically 3 (three) times daily. 30 g 3   Olopatadine HCl (PATADAY) 0.7 % SOLN Apply to eye.     PRESCRIPTION MEDICATION CPAP- At bedtime and during any time of rest     tadalafil (CIALIS) 5 MG tablet Take 1 tablet by mouth daily.     triamcinolone cream (KENALOG) 0.1 % Apply 1 application  topically daily as needed (to affected areas- for itching).     WIXELA INHUB 100-50 MCG/ACT AEPB INHALE 1 PUFF BY MOUTH TWICE A DAY 60 each 6   No facility-administered medications prior to visit.     EXAM:  There were no vitals taken for this visit.  There is no height or weight on file to calculate BMI. Wt Readings from Last 3 Encounters:  12/31/22 207 lb (93.9 kg)  12/26/22 206 lb (93.4 kg)  12/17/22 204 lb (92.5 kg)    Physical Exam: Vital signs reviewed [id number] is a well-developed well-nourished alert cooperative    who appearsr stated age in no acute distress.  HEENT: normocephalic atraumatic , Eyes: PERRL EOM's full, conjunctiva clear, Nares: paten,t no deformity discharge or tenderness., Ears: no deformity EAC's clear TMs with normal landmarks. Mouth: clear OP, no lesions, edema.  Moist mucous membranes. Dentition in adequate repair. NECK: supple without masses,  thyromegaly or bruits. CHEST/PULM:  Clear to auscultation and percussion breath sounds equal no wheeze , rales or rhonchi. No chest wall deformities or tenderness. CV: PMI is nondisplaced, S1 S2 no gallops, murmurs, rubs. Peripheral pulses are full without delay.No JVD .  ABDOMEN: Bowel sounds normal nontender  No guard or rebound, no hepato splenomegal no CVA tenderness.  Extremtities:  No clubbing cyanosis or edema, no acute joint swelling or redness no focal atrophy NEURO:  Oriented x3, cranial nerves 3-12 appear to be intact, no obvious focal weakness,gait within normal limits no abnormal reflexes or asymmetrical SKIN: No acute rashes normal turgor, color, no bruising or petechiae. PSYCH: Oriented, good eye contact, no obvious depression anxiety, cognition and judgment appear normal. LN: no cervical axillaryadenopathy  Lab Results  Component Value Date   WBC 8.3 05/07/2022   HGB 13.4 05/07/2022   HCT 39.4 05/07/2022   PLT 174 05/07/2022   GLUCOSE 96 05/07/2022   CHOL 189 11/06/2021   TRIG 47.0 11/06/2021   HDL 54.60 11/06/2021   LDLCALC 125 (H) 11/06/2021   ALT 20 11/06/2021   AST 18 11/06/2021   NA 136 05/07/2022   K 4.5 05/07/2022   CL 103 05/07/2022   CREATININE 0.66 05/07/2022   BUN 12 05/07/2022   CO2 26 05/07/2022   TSH 2.79 03/25/2022   PSA 6.03 08/13/2022   INR 1.0 12/24/2020   HGBA1C 5.5 11/06/2021    BP Readings from Last 3 Encounters:  12/31/22 113/71  12/26/22 120/78  12/17/22 124/72    Labplan reviewed with patient   ASSESSMENT AND PLAN:  Discussed the following assessment and plan:    ICD-10-CM   1. Visit for preventive health examination  Z00.00     2. Thrombocytopenia (HCC)  D69.6     3. Essential hypertension  I10     4. Medication management  Z79.899      No follow-ups on file.  Patient Care Team: Riata Ikeda, [name], MD as PCP - General orthopedic Jerilee Field, MD as Attending Physician (Urology) Waymon Budge, MD as Attending  Physician (Pulmonary Disease) Sherrill Raring, [facility] (Pharmacist) There are no Patient Instructions on file for this visit.  [name]. Varshini Arrants M.D.

## 2023-02-11 ENCOUNTER — Encounter: Payer: Self-pay | Admitting: Internal Medicine

## 2023-02-11 ENCOUNTER — Ambulatory Visit (INDEPENDENT_AMBULATORY_CARE_PROVIDER_SITE_OTHER): Payer: Medicare PPO | Admitting: Internal Medicine

## 2023-02-11 VITALS — BP 110/70 | HR 57 | Temp 98.0°F | Ht 62.5 in | Wt 201.2 lb

## 2023-02-11 DIAGNOSIS — R972 Elevated prostate specific antigen [PSA]: Secondary | ICD-10-CM

## 2023-02-11 DIAGNOSIS — D696 Thrombocytopenia, unspecified: Secondary | ICD-10-CM

## 2023-02-11 DIAGNOSIS — Z0000 Encounter for general adult medical examination without abnormal findings: Secondary | ICD-10-CM

## 2023-02-11 DIAGNOSIS — D692 Other nonthrombocytopenic purpura: Secondary | ICD-10-CM

## 2023-02-11 DIAGNOSIS — M150 Primary generalized (osteo)arthritis: Secondary | ICD-10-CM | POA: Diagnosis not present

## 2023-02-11 DIAGNOSIS — Z79899 Other long term (current) drug therapy: Secondary | ICD-10-CM

## 2023-02-11 DIAGNOSIS — Z9889 Other specified postprocedural states: Secondary | ICD-10-CM | POA: Diagnosis not present

## 2023-02-11 DIAGNOSIS — R718 Other abnormality of red blood cells: Secondary | ICD-10-CM

## 2023-02-11 DIAGNOSIS — I10 Essential (primary) hypertension: Secondary | ICD-10-CM

## 2023-02-11 DIAGNOSIS — M7989 Other specified soft tissue disorders: Secondary | ICD-10-CM | POA: Diagnosis not present

## 2023-02-11 DIAGNOSIS — R6889 Other general symptoms and signs: Secondary | ICD-10-CM

## 2023-02-11 LAB — CBC WITH DIFFERENTIAL/PLATELET
Basophils Absolute: 0 10*3/uL (ref 0.0–0.1)
Basophils Relative: 0.6 % (ref 0.0–3.0)
Eosinophils Absolute: 0.4 10*3/uL (ref 0.0–0.7)
Eosinophils Relative: 6.7 % — ABNORMAL HIGH (ref 0.0–5.0)
HCT: 40.3 % (ref 39.0–52.0)
Hemoglobin: 13.8 g/dL (ref 13.0–17.0)
Lymphocytes Relative: 10.3 % — ABNORMAL LOW (ref 12.0–46.0)
Lymphs Abs: 0.7 10*3/uL (ref 0.7–4.0)
MCHC: 34.3 g/dL (ref 30.0–36.0)
MCV: 105.9 fL — ABNORMAL HIGH (ref 78.0–100.0)
Monocytes Absolute: 0.7 10*3/uL (ref 0.1–1.0)
Monocytes Relative: 11.4 % (ref 3.0–12.0)
Neutro Abs: 4.7 10*3/uL (ref 1.4–7.7)
Neutrophils Relative %: 71 % (ref 43.0–77.0)
Platelets: 174 10*3/uL (ref 150.0–400.0)
RBC: 3.81 Mil/uL — ABNORMAL LOW (ref 4.22–5.81)
RDW: 15.1 % (ref 11.5–15.5)
WBC: 6.5 10*3/uL (ref 4.0–10.5)

## 2023-02-11 LAB — LIPID PANEL
Cholesterol: 175 mg/dL (ref 0–200)
HDL: 46.4 mg/dL (ref >39.00)
LDL Cholesterol: 114 mg/dL — ABNORMAL HIGH (ref 0–99)
NonHDL: 128.81
Total CHOL/HDL Ratio: 4
Triglycerides: 76 mg/dL (ref 0.0–149.0)
VLDL: 15.2 mg/dL (ref 0.0–40.0)

## 2023-02-11 LAB — COMPREHENSIVE METABOLIC PANEL
ALT: 18 U/L (ref 0–53)
AST: 19 U/L (ref 0–37)
Albumin: 4.5 g/dL (ref 3.5–5.2)
Alkaline Phosphatase: 68 U/L (ref 39–117)
BUN: 10 mg/dL (ref 6–23)
CO2: 28 meq/L (ref 19–32)
Calcium: 9.1 mg/dL (ref 8.4–10.5)
Chloride: 101 meq/L (ref 96–112)
Creatinine, Ser: 0.7 mg/dL (ref 0.40–1.50)
GFR: 92.78 mL/min (ref >60.00)
Glucose, Bld: 96 mg/dL (ref 70–99)
Potassium: 3.8 meq/L (ref 3.5–5.1)
Sodium: 136 meq/L (ref 135–145)
Total Bilirubin: 0.9 mg/dL (ref 0.2–1.2)
Total Protein: 6.8 g/dL (ref 6.0–8.3)

## 2023-02-11 LAB — HEMOGLOBIN A1C: Hgb A1c MFr Bld: 5.5 % (ref 4.6–6.5)

## 2023-02-11 LAB — PSA: PSA: 13.58 ng/mL — ABNORMAL HIGH (ref 0.10–4.00)

## 2023-02-11 LAB — TSH: TSH: 2.22 u[IU]/mL (ref 0.35–5.50)

## 2023-02-11 NOTE — Patient Instructions (Addendum)
I agree go slow on rehab energy and exercise tolerance .  We can get vascular to give a  full opinion    may end up being  continue compression and follow.   Fu if  cannot improve  exercise tolerance.

## 2023-02-16 NOTE — Progress Notes (Signed)
Results stable or normal   including platelets  being normal  except psa is up as expected   but now 13.58 . Should fu with your urologist  about this level

## 2023-03-16 DIAGNOSIS — Z01818 Encounter for other preprocedural examination: Secondary | ICD-10-CM | POA: Diagnosis not present

## 2023-03-16 DIAGNOSIS — H02535 Eyelid retraction left lower eyelid: Secondary | ICD-10-CM | POA: Diagnosis not present

## 2023-03-16 DIAGNOSIS — H0279 Other degenerative disorders of eyelid and periocular area: Secondary | ICD-10-CM | POA: Diagnosis not present

## 2023-03-16 DIAGNOSIS — H019 Unspecified inflammation of eyelid: Secondary | ICD-10-CM | POA: Diagnosis not present

## 2023-03-16 DIAGNOSIS — Z09 Encounter for follow-up examination after completed treatment for conditions other than malignant neoplasm: Secondary | ICD-10-CM | POA: Diagnosis not present

## 2023-03-16 DIAGNOSIS — H02135 Senile ectropion of left lower eyelid: Secondary | ICD-10-CM | POA: Diagnosis not present

## 2023-03-16 DIAGNOSIS — H0289 Other specified disorders of eyelid: Secondary | ICD-10-CM | POA: Diagnosis not present

## 2023-03-16 DIAGNOSIS — H02413 Mechanical ptosis of bilateral eyelids: Secondary | ICD-10-CM | POA: Diagnosis not present

## 2023-03-17 ENCOUNTER — Ambulatory Visit: Payer: No Typology Code available for payment source | Admitting: Podiatry

## 2023-03-17 ENCOUNTER — Encounter: Payer: Self-pay | Admitting: Podiatry

## 2023-03-17 DIAGNOSIS — M79674 Pain in right toe(s): Secondary | ICD-10-CM

## 2023-03-17 DIAGNOSIS — B351 Tinea unguium: Secondary | ICD-10-CM | POA: Diagnosis not present

## 2023-03-17 DIAGNOSIS — M79675 Pain in left toe(s): Secondary | ICD-10-CM | POA: Diagnosis not present

## 2023-03-17 DIAGNOSIS — M205X9 Other deformities of toe(s) (acquired), unspecified foot: Secondary | ICD-10-CM

## 2023-03-17 NOTE — Progress Notes (Signed)
Complaint:  Visit Type: Patient presents to my office for  preventative foot care services. Complaint: Patient states" my nails have grown long and thick and become painful to walk and wear shoes" Patient has been diagnosed with thrombocytopenia.The patient presents for preventative foot care services.  Podiatric Exam: Vascular: dorsalis pedis and posterior tibial pulses are palpable bilateral. Capillary return is immediate. Temperature gradient is WNL. Skin turgor WNL  Sensorium: Normal Semmes Weinstein monofilament test. Normal tactile sensation bilaterally. Nail Exam: Pt has thick disfigured discolored nails with subungual debris noted bilateral hallux nails. Ulcer Exam: There is no evidence of ulcer or pre-ulcerative changes or infection. Orthopedic Exam: Muscle tone and strength are WNL. No limitations in general ROM. No crepitus or effusions noted. Foot type and digits show no abnormalities. Hallux limitus 1st MPJ  B/L. Skin: No Porokeratosis. No infection or ulcers  Diagnosis:  Onychomycosis, , Pain in right toe, pain in left toes  Treatment & Plan Procedures and Treatment: Consent by patient was obtained for treatment procedures.   Debridement of mycotic and hypertrophic toenails, 1 through 5 bilateral and clearing of subungual debris. No ulceration, no infection noted.  Return Visit-Office Procedure: Patient instructed to return to the office for a follow up visit 3 months for continued evaluation and treatment.    Helane Gunther DPM

## 2023-04-02 ENCOUNTER — Other Ambulatory Visit: Payer: Self-pay

## 2023-04-02 DIAGNOSIS — R600 Localized edema: Secondary | ICD-10-CM

## 2023-04-09 ENCOUNTER — Ambulatory Visit (HOSPITAL_COMMUNITY)
Admission: RE | Admit: 2023-04-09 | Discharge: 2023-04-09 | Disposition: A | Discharge status: Home / Self Care | Payer: Medicare PPO | Source: Ambulatory Visit | Attending: Vascular Surgery | Admitting: Vascular Surgery

## 2023-04-09 ENCOUNTER — Ambulatory Visit: Payer: Medicare PPO | Admitting: Vascular Surgery

## 2023-04-09 ENCOUNTER — Encounter: Payer: Self-pay | Admitting: Vascular Surgery

## 2023-04-09 VITALS — BP 139/77 | HR 60 | Temp 98.7°F | Resp 20 | Ht 62.5 in | Wt 207.0 lb

## 2023-04-09 DIAGNOSIS — I872 Venous insufficiency (chronic) (peripheral): Secondary | ICD-10-CM

## 2023-04-09 DIAGNOSIS — R600 Localized edema: Secondary | ICD-10-CM | POA: Insufficient documentation

## 2023-04-09 NOTE — Progress Notes (Signed)
 Office Note     CC: Right lower extremity edema Requesting Provider:  Charlett Apolinar POUR, MD  HPI: [name] is a 73 y.o. (1951/03/21) male who presents at the request of Panosh, Apolinar POUR, MD for evaluation of right lower extremity edema.  On exam today, [name] was doing well.  A native of [place] , he has lived in [place] for a number of years.  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  In his spare time, [name] enjoys diving.  He was hoping to be involved in core restoration after retirement, but has been sidelined due to bilateral knee replacements, the left of which required a revision.  Several months ago, [name] cut his leg, and the small pinhole became infected.  The infection resolved with use of antibiotics but it took significantly longer for the lower extremity edema to resolve.  The edema was appreciated in the right leg only.  He wears compression stockings, and has noticed a significant amount of improvement.  His mother has venous disease, but he has never been formally diagnosed.  No history of DVT, previous venous procedures.  No painful or bleeding ulcerations or varicosities. [name] admits that he has been less active than he should, and he plans on getting back in the gym.   Past Medical History:  Diagnosis Date   Allergy     Anxiety    Arthritis    knees   Asthma    Depression    Dysrhythmia    RBBB   Elevated PSA    us  and bx negative 2011   Hypertension    nl cardiolite in 1998   Pneumonia 2005   right hospitalized    Rhinosinusitis    Sleep apnea    wears CPAP    Past Surgical History:  Procedure Laterality Date   COLONOSCOPY  03/09/2013   ECTROPION REPAIR Left    2023   EYE SURGERY Left 04/08/2022   to bring  lower eyelid up   FACIAL RECONSTRUCTION SURGERY     1992   HERNIA REPAIR     NASAL SINUS SURGERY     REPLACEMENT TOTAL KNEE  01/2010   left; Dr Ernie; RIght knee 06-2010   REVERSE SHOULDER ARTHROPLASTY Right 03/14/2022   Procedure: REVERSE SHOULDER ARTHROPLASTY WITH REMOVAL OF ANATOMIC ARTHROPLASTY;  Surgeon: Melita Drivers, MD;  Location: [facility];  Service: Orthopedics;  Laterality: Right;   right shoulder arthroplasty     2018   shoulder atrhroscopic surgery  2011   Right   TOTAL KNEE REVISION Left 12/05/2021   Procedure: TOTAL KNEE REVISION;  Surgeon: Ernie Cough, MD;  Location: [facility];  Service: Orthopedics;  Laterality: Left;   TOTAL SHOULDER REVISION Right 05/09/2022   Procedure: Revision Right Reverse Shoulder Arthroplasty;  Surgeon: Melita Drivers, MD;  Location: [facility];  Service: Orthopedics;  Laterality: Right;    VENTRAL HERNIA REPAIR  03/2007   rerepair winter 2010   umbilical per pt    Social History   Socioeconomic History   Marital status: Single    Spouse name: Not on file   Number of children: Not on file   Years of education: Not on file   Highest education level: Bachelor's degree (e.g., BA, AB, BS)  Occupational History   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   Stopped drinking in 1986    Nonosmoker    Single   HHof 1    Social Drivers of Health   Financial Resource Strain: Low Risk  (02/10/2023)   Overall Financial Resource Strain (CARDIA)    Difficulty of Paying Living Expenses: Not very hard  Food Insecurity: No Food Insecurity (02/10/2023)   Hunger Vital Sign    Worried  About Running Out of Food in the Last Year: Never true    Ran Out of Food in the Last Year: Never true  Transportation Needs: No Transportation Needs (02/10/2023)   PRAPARE - Administrator, Civil Service (Medical): No    Lack of Transportation (Non-Medical): No  Physical Activity: Sufficiently Active (02/10/2023)   Exercise Vital Sign    Days of Exercise per Week: 5 days    Minutes of Exercise per Session: 60 min  Stress: Stress Concern Present (02/10/2023)   [employer] of Occupational Health - Occupational Stress Questionnaire    Feeling of Stress : To some extent  Social Connections: Moderately Isolated (02/10/2023)   Social Connection and Isolation Panel [NHANES]    Frequency of Communication with Friends and Family: More than three times a week    Frequency of Social Gatherings with Friends and Family: More than three times a week    Attends Religious Services: Never    Database Administrator or Organizations: Yes    Attends Engineer, Structural: More than 4 times per year    Marital Status: Never married  Intimate Partner Violence: Not At Risk (06/09/2022)   Humiliation, Afraid, Rape, and Kick questionnaire    Fear of Current or Ex-Partner: No    Emotionally Abused: No    Physically Abused: No    Sexually Abused: No   Family History  Problem Relation Age of Onset   Heart failure Father        MV replacement   Myelodysplastic syndrome Father    Asthma Mother    Polycythemia Brother    Asthma Other    Hypertension Other    Diabetes Maternal Grandmother    Healthy Sister    Healthy Brother    Healthy Brother    Colon cancer Neg Hx    Pancreatic cancer Neg Hx    Stomach cancer Neg Hx    Esophageal cancer Neg Hx    Colon polyps Neg Hx    Rectal cancer Neg Hx     Current Outpatient Medications  Medication Sig Dispense Refill   amLODipine -benazepril  (LOTREL) 5-10 MG capsule TAKE 1 CAPSULE BY MOUTH EVERY DAY 90 capsule 2   Artificial Tear  Solution (SOOTHE XP) SOLN Place 1 drop into both eyes 3 (three) times daily as needed (for dryness).     augmented betamethasone dipropionate (DIPROLENE-AF) 0.05 % cream      Azelastine  HCl 137 MCG/SPRAY SOLN PLACE 1-2 SPRAYS INTO BOTH NOSTRILS 2 (TWO) TIMES DAILY AS NEEDED FOR RHINITIS. 137 mL 12   Cephalexin  500 MG tablet Take 1 tablet every 6 hours by oral route as directed for 10 days, for left lower eyelid infection.     clobetasol (OLUX) 0.05 % topical foam Apply 1 application  topically daily as needed (scalp itching).     Cyanocobalamin  (B-12) 1000 MCG SUBL Place 1,000 mcg under the tongue daily.     erythromycin  ophthalmic ointment APPLY 1 CM RIBBON INTO THE LOWER CONJUNCTIVAL SAC IN THE LEFT EYE AND OVER SURGICAL SITE 4 TIMES PER DAY     escitalopram  (LEXAPRO ) 10 MG tablet TAKE 1 TABLET BY MOUTH EVERY DAY 90 tablet 2   fexofenadine (ALLEGRA) 180 MG tablet Take 180 mg by mouth daily.     fluticasone  (FLONASE ) 50 MCG/ACT nasal spray SPRAY 2 SPRAYS INTO EACH NOSTRIL EVERY DAY 16 mL 12   ibuprofen  (ADVIL ) 800 MG tablet Take 1 tablet (800 mg total) by mouth every 8 (eight) hours as needed. 90 tablet 2   Ipratropium-Albuterol  (COMBIVENT  RESPIMAT) 20-100 MCG/ACT AERS respimat Inhale 1 puff up to four times daily as needed 4 g 11   Olopatadine  HCl (PATADAY ) 0.7 % SOLN Apply to eye.     PRESCRIPTION MEDICATION CPAP- At bedtime and during any time of rest     tadalafil (CIALIS) 5 MG tablet Take 1 tablet by mouth daily.     triamcinolone  cream (KENALOG) 0.1 % Apply 1 application  topically daily as needed (to affected areas- for itching).     WIXELA INHUB 100-50 MCG/ACT AEPB INHALE 1 PUFF BY MOUTH TWICE A DAY 60 each 6   No current facility-administered medications for this visit.    Allergies  Allergen Reactions   Tape Other (See Comments)    Causes bleeding and redness   Egg-Derived Products Diarrhea, Nausea Only and Other (See Comments)    Terrible stomach pain     REVIEW OF SYSTEMS:   [X]  denotes positive finding, [ ]  denotes negative finding Cardiac  Comments:  Chest pain or chest pressure:    Shortness of breath upon exertion:    Short of breath when lying flat:    Irregular heart rhythm:        Vascular    Pain in calf, thigh, or hip brought on by ambulation:    Pain in feet at night that wakes you up from your sleep:     Blood clot in your veins:    Leg swelling:         Pulmonary    Oxygen at home:    Productive cough:     Wheezing:         Neurologic    Sudden weakness in arms or legs:     Sudden numbness in arms or legs:     Sudden onset of difficulty speaking or slurred speech:    Temporary loss of vision in one eye:     Problems with dizziness:         Gastrointestinal    Blood in stool:     Vomited blood:         Genitourinary    Burning when urinating:     Blood in urine:        Psychiatric    Major depression:         Hematologic    Bleeding problems:    Problems with blood clotting too easily:        Skin    Rashes or ulcers:        Constitutional  Fever or chills:      PHYSICAL EXAMINATION:  Vitals:   04/09/23 1505  BP: 139/77  Pulse: 60  Resp: 20  Temp: 98.7 F (37.1 C)  SpO2: 97%  Weight: 207 lb (93.9 kg)  Height: 5' 2.5 (1.588 m)    General:  WDWN in NAD; vital signs documented above Gait: Not observed HENT: WNL, normocephalic Pulmonary: normal non-labored breathing , without Rales, rhonchi,  wheezing Cardiac: regular HR, Abdomen: soft, NT, no masses Skin: without rashes Vascular Exam/Pulses:  Right Left  Radial 2+ (normal) 2+ (normal)  Ulnar    Femoral    Popliteal    DP 2+ (normal) 2+ (normal)  PT     Extremities: without ischemic changes, without Gangrene , without cellulitis; without open wounds;  Musculoskeletal: no muscle wasting or atrophy  Neurologic: A&O X 3;  No focal weakness or paresthesias are detected Psychiatric:  The pt has Normal affect.   Non-Invasive Vascular Imaging:    Venous Reflux Times  +--------------+---------+------+-----------+------------+--------+  RIGHT        Reflux NoRefluxReflux TimeDiameter cmsComments                          Yes                                   +--------------+---------+------+-----------+------------+--------+  CFV          no                                              +--------------+---------+------+-----------+------------+--------+  FV prox                 yes   >1 second                       +--------------+---------+------+-----------+------------+--------+  FV mid        no                                              +--------------+---------+------+-----------+------------+--------+  FV dist       no                                              +--------------+---------+------+-----------+------------+--------+  Popliteal    no                                              +--------------+---------+------+-----------+------------+--------+  GSV at SFJ              yes    >500 ms     0.693              +--------------+---------+------+-----------+------------+--------+  GSV prox thighno                            0.47              +--------------+---------+------+-----------+------------+--------+  GSV mid thigh           yes    >500 ms     0.415              +--------------+---------+------+-----------+------------+--------+  GSV dist thighno                           0.475              +--------------+---------+------+-----------+------------+--------+  GSV at knee   no                           0.448              +--------------+---------+------+-----------+------------+--------+  GSV prox calf                              0.352              +--------------+---------+------+-----------+------------+--------+  SSV Pop Fossa no                           0.132               +--------------+---------+------+-----------+------------+--------+  SSV prox calf no                           0.142              +--------------+---------+------+-----------+------------+--------+  SSV mid calf  no                           0.187              +--------------+---------+------+-----------+------------+--------+     ASSESSMENT/PLAN:: 73 y.o. male presenting with history of right lower extremity swelling.  Fortunately, the swelling has resolved.  He has a family history of chronic venous insufficiency, and wears compression stockings on a regular basis.  On physical exam, he had no significant right lower extremity swelling.  No skin changes.  He had a palpable pulse in the feet bilaterally.   Right lower extremity venous duplex ultrasound demonstrated reflux at the femoral vein, saphenofemoral junction and GSV at the mid thigh.  Otherwise, valves were competent.  [name] and I had a long discussion regarding the above.  He has a small amount of localized venous insufficiency.  With this level of venous disease, he would not benefit from intervention.  Fortunately, he remains asymptomatic. We discussed that he would be best served with continued compression stockings and increasing his exercise regimen, so that he can continue to maintain an active lifestyle and continue diving as a hobby.  [name] can follow-up with me as needed.   Fonda FORBES Rim, MD [facility] [phone]

## 2023-04-15 DIAGNOSIS — R972 Elevated prostate specific antigen [PSA]: Secondary | ICD-10-CM | POA: Diagnosis not present

## 2023-04-23 DIAGNOSIS — R3912 Poor urinary stream: Secondary | ICD-10-CM | POA: Diagnosis not present

## 2023-04-23 DIAGNOSIS — R972 Elevated prostate specific antigen [PSA]: Secondary | ICD-10-CM | POA: Diagnosis not present

## 2023-04-23 DIAGNOSIS — N401 Enlarged prostate with lower urinary tract symptoms: Secondary | ICD-10-CM | POA: Diagnosis not present

## 2023-04-29 ENCOUNTER — Other Ambulatory Visit: Payer: Self-pay | Admitting: Urology

## 2023-04-29 DIAGNOSIS — N401 Enlarged prostate with lower urinary tract symptoms: Secondary | ICD-10-CM

## 2023-04-29 DIAGNOSIS — R3912 Poor urinary stream: Secondary | ICD-10-CM

## 2023-04-29 DIAGNOSIS — R972 Elevated prostate specific antigen [PSA]: Secondary | ICD-10-CM

## 2023-05-04 DIAGNOSIS — H02413 Mechanical ptosis of bilateral eyelids: Secondary | ICD-10-CM | POA: Diagnosis not present

## 2023-05-04 DIAGNOSIS — H0289 Other specified disorders of eyelid: Secondary | ICD-10-CM | POA: Diagnosis not present

## 2023-05-04 DIAGNOSIS — H019 Unspecified inflammation of eyelid: Secondary | ICD-10-CM | POA: Diagnosis not present

## 2023-05-04 DIAGNOSIS — H02535 Eyelid retraction left lower eyelid: Secondary | ICD-10-CM | POA: Diagnosis not present

## 2023-05-04 DIAGNOSIS — H02135 Senile ectropion of left lower eyelid: Secondary | ICD-10-CM | POA: Diagnosis not present

## 2023-05-04 DIAGNOSIS — H0279 Other degenerative disorders of eyelid and periocular area: Secondary | ICD-10-CM | POA: Diagnosis not present

## 2023-05-04 DIAGNOSIS — Z09 Encounter for follow-up examination after completed treatment for conditions other than malignant neoplasm: Secondary | ICD-10-CM | POA: Diagnosis not present

## 2023-05-04 DIAGNOSIS — Z01818 Encounter for other preprocedural examination: Secondary | ICD-10-CM | POA: Diagnosis not present

## 2023-05-05 ENCOUNTER — Encounter: Payer: Self-pay | Admitting: Urology

## 2023-05-05 DIAGNOSIS — H02413 Mechanical ptosis of bilateral eyelids: Secondary | ICD-10-CM | POA: Diagnosis not present

## 2023-05-05 DIAGNOSIS — Z01818 Encounter for other preprocedural examination: Secondary | ICD-10-CM | POA: Diagnosis not present

## 2023-05-05 DIAGNOSIS — H0279 Other degenerative disorders of eyelid and periocular area: Secondary | ICD-10-CM | POA: Diagnosis not present

## 2023-05-05 DIAGNOSIS — H02135 Senile ectropion of left lower eyelid: Secondary | ICD-10-CM | POA: Diagnosis not present

## 2023-05-05 DIAGNOSIS — H02535 Eyelid retraction left lower eyelid: Secondary | ICD-10-CM | POA: Diagnosis not present

## 2023-05-05 DIAGNOSIS — Z09 Encounter for follow-up examination after completed treatment for conditions other than malignant neoplasm: Secondary | ICD-10-CM | POA: Diagnosis not present

## 2023-05-05 DIAGNOSIS — S01122S Laceration with foreign body of left eyelid and periocular area, sequela: Secondary | ICD-10-CM | POA: Diagnosis not present

## 2023-05-05 DIAGNOSIS — H019 Unspecified inflammation of eyelid: Secondary | ICD-10-CM | POA: Diagnosis not present

## 2023-05-05 DIAGNOSIS — H0289 Other specified disorders of eyelid: Secondary | ICD-10-CM | POA: Diagnosis not present

## 2023-05-29 ENCOUNTER — Other Ambulatory Visit: Payer: Self-pay | Admitting: Family

## 2023-05-31 ENCOUNTER — Ambulatory Visit
Admission: RE | Admit: 2023-05-31 | Discharge: 2023-05-31 | Disposition: A | Discharge status: Home / Self Care | Payer: Medicare PPO | Source: Ambulatory Visit | Attending: Urology | Admitting: Urology

## 2023-05-31 DIAGNOSIS — R972 Elevated prostate specific antigen [PSA]: Secondary | ICD-10-CM

## 2023-05-31 DIAGNOSIS — N401 Enlarged prostate with lower urinary tract symptoms: Secondary | ICD-10-CM

## 2023-05-31 DIAGNOSIS — R3912 Poor urinary stream: Secondary | ICD-10-CM

## 2023-05-31 MED ORDER — GADOPICLENOL 0.5 MMOL/ML IV SOLN
9.0000 mL | Freq: Once | INTRAVENOUS | Status: AC | PRN
  Administered 2023-05-31: 9 mL via INTRAVENOUS

## 2023-06-09 ENCOUNTER — Encounter: Payer: Self-pay | Admitting: Internal Medicine

## 2023-06-10 MED ORDER — ESCITALOPRAM OXALATE 10 MG PO TABS: 10.0000 mg | ORAL_TABLET | Freq: Every day | ORAL | 2 refills | Status: DC

## 2023-06-10 MED ORDER — IBUPROFEN 800 MG PO TABS: 800.0000 mg | ORAL_TABLET | Freq: Three times a day (TID) | ORAL | 2 refills | Status: DC | PRN

## 2023-06-10 MED ORDER — AMLODIPINE BESY-BENAZEPRIL HCL 5-10 MG PO CAPS: ORAL_CAPSULE | ORAL | 2 refills | Status: DC

## 2023-06-17 ENCOUNTER — Ambulatory Visit: Payer: Medicare PPO | Admitting: Internal Medicine

## 2023-06-17 ENCOUNTER — Encounter: Payer: Self-pay | Admitting: Internal Medicine

## 2023-06-17 VITALS — BP 114/76 | HR 65 | Temp 97.8°F | Ht 62.5 in | Wt 213.8 lb

## 2023-06-17 DIAGNOSIS — I10 Essential (primary) hypertension: Secondary | ICD-10-CM

## 2023-06-17 DIAGNOSIS — R6889 Other general symptoms and signs: Secondary | ICD-10-CM

## 2023-06-17 DIAGNOSIS — H029 Unspecified disorder of eyelid: Secondary | ICD-10-CM

## 2023-06-17 DIAGNOSIS — D692 Other nonthrombocytopenic purpura: Secondary | ICD-10-CM

## 2023-06-17 DIAGNOSIS — J4520 Mild intermittent asthma, uncomplicated: Secondary | ICD-10-CM

## 2023-06-17 DIAGNOSIS — M25562 Pain in left knee: Secondary | ICD-10-CM | POA: Diagnosis not present

## 2023-06-17 DIAGNOSIS — Z79899 Other long term (current) drug therapy: Secondary | ICD-10-CM

## 2023-06-17 DIAGNOSIS — D696 Thrombocytopenia, unspecified: Secondary | ICD-10-CM

## 2023-06-17 NOTE — Progress Notes (Signed)
 Chief Complaint  Patient presents with   Medical Management of Chronic Issues    Pt is here for his 4 months follow up. Pt reports swelling on R leg, worsen during the during the day. Advise to wear compression sock. Pt states he is not wearing it as much as he should.  Wore it yesterday and today. Pt states he will have a surgery on his eyelid with [facility].    HPI: Patient  [name]  73 y.o. comes in today for  a number of updates   To have eyelid surgery left lower lid  . Ectropion.   March  24  on keflex and berythromycin.   Joints over all ok  rom issues in shoulder.   Getting better   biking better. Less endurance   short of 8 miles and  feels  will get better.  Used to be able to do more before last year  but  no cps sob otherwise  Appt with Dr Charlann Boxer next week about left knee. Revision  and scar under knee cap.  Hurts when sits.  . Pulmonary  stable  rare use of inhaler .  Ok on bike rides .  Dr Stacie Acres for [facility]    PSA :   had mri of prostate  and ok . May need bx   fu appt    Health Maintenance  Topic Date Due   Medicare Annual Wellness (AWV)  06/09/2023   DTaP/Tdap/Td (2 - Tdap) 08/11/2023 (Originally 09/18/2019)   COVID-19 Vaccine (9 - 2024-25 season) 02/11/2024 (Originally 01/22/2023)   Colonoscopy  11/07/2027   Pneumonia Vaccine 77+ Years old  Completed   INFLUENZA VACCINE  Completed   Hepatitis C Screening  Completed   Zoster Vaccines- Shingrix  Completed   HPV VACCINES  Aged Out      ROS:  GEN/ HEENT: No fever, significant weight changes sweats headaches vision problems hearing changes, CV/ PULM; No chest pain shortness of breath cough, syncope,edema  change in exercise tolerance. GI /GU: No adominal pain, vomiting, change in bowel habits. No blood in the stool. No significant GU symptoms. SKIN/HEME: ,no acute skin rashes suspicious lesions or bleeding. No lymphadenopathy, nodules, masses.  NEURO/ PSYCH:  No neurologic signs such as weakness  numbness. No depression anxiety. IMM/ Allergy: No unusual infections.  Allergy .   REST of 12 system review negative except as per HPI   Past Medical History:  Diagnosis Date   Allergy    Anxiety    Arthritis    knees   Asthma    Depression    Dysrhythmia    RBBB   Elevated PSA    [place] and bx negative 2011   Hypertension    nl cardiolite in 1998   Pneumonia 2005   right hospitalized    Rhinosinusitis    Sleep apnea    wears CPAP    Past Surgical History:  Procedure Laterality Date   COLONOSCOPY  03/09/2013   ECTROPION REPAIR Left    2023   EYE SURGERY Left 04/08/2022   to bring lower eyelid up   FACIAL RECONSTRUCTION SURGERY     1992   HERNIA REPAIR     NASAL SINUS SURGERY     REPLACEMENT TOTAL KNEE  01/2010   left; Dr Charlann Boxer; RIght knee 06-2010   REVERSE SHOULDER ARTHROPLASTY Right 03/14/2022   Procedure: REVERSE SHOULDER ARTHROPLASTY WITH REMOVAL OF ANATOMIC ARTHROPLASTY;  Surgeon: Francena Hanly, MD;  Location: [facility];  Service: Orthopedics;  Laterality: Right;   right shoulder arthroplasty     2018   shoulder atrhroscopic surgery  2011   Right   TOTAL KNEE REVISION Left 12/05/2021   Procedure: TOTAL KNEE REVISION;  Surgeon: [name], MD;  Location: [facility];  Service: Orthopedics;  Laterality: Left;   TOTAL SHOULDER REVISION Right 05/09/2022   Procedure: Revision Right Reverse Shoulder Arthroplasty;  Surgeon: Francena Hanly, MD;  Location: [facility];  Service: Orthopedics;  Laterality: Right;    VENTRAL HERNIA REPAIR  03/2007   rerepair winter 2010   umbilical per pt    Family History  Problem Relation Age of Onset   Heart failure Father        MV replacement   Myelodysplastic syndrome Father    Asthma Mother    Polycythemia Brother    Asthma Other    Hypertension Other    Diabetes Maternal Grandmother    Healthy Sister    Healthy Brother    Healthy Brother    Colon cancer Neg Hx    Pancreatic cancer Neg Hx    Stomach cancer Neg Hx    Esophageal  cancer Neg Hx    Colon polyps Neg Hx    Rectal cancer Neg Hx     Social History   Socioeconomic History   Marital status: Single    Spouse name: Not on file   Number of children: Not on file   Years of education: Not on file   Highest education level: Bachelor's degree (e.g., BA, AB, BS)  Occupational History   Occupation: Higher education careers adviser    Comment: 40 hours  Tobacco Use   Smoking status: Never   Smokeless tobacco: Never  Vaping Use   Vaping status: Never Used  Substance and Sexual Activity   Alcohol use: No    Comment: Quit drinking ETOH 1986   Drug use: No   Sexual activity: Not Currently  Other Topics Concern   Not on file  Social History Narrative    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   Stopped drinking in 1986    Nonosmoker    Single   HHof 1    Social Drivers of Health   Financial Resource Strain: Low Risk  (06/16/2023)   Overall Financial Resource Strain (CARDIA)    Difficulty of Paying Living Expenses: Not very hard  Food Insecurity: No Food Insecurity (06/16/2023)   Hunger Vital Sign    Worried About Running Out of Food in the Last Year: Never true    Ran Out of Food in the Last Year: Never true  Transportation Needs: No Transportation Needs (06/16/2023)   PRAPARE - Administrator, Civil Service (Medical): No    Lack of Transportation (Non-Medical): No  Physical Activity: Sufficiently Active (06/16/2023)   Exercise Vital Sign    Days of Exercise per Week: 2 days    Minutes of Exercise per Session: 90 min  Stress: Stress Concern Present (06/16/2023)   [employer] of Occupational Health - Occupational Stress Questionnaire    Feeling of Stress : To some extent  Social Connections: Moderately Isolated (06/16/2023)   Social Connection and Isolation Panel [NHANES]    Frequency of Communication with Friends and Family: More than three times a week    Frequency of Social Gatherings with Friends and Family: Three times a week     Attends Religious Services: Never    Active Member of Clubs or Organizations: Yes    Attends  Engineer, structural: More than 4 times per year    Marital Status: Never married    Outpatient Medications Prior to Visit  Medication Sig Dispense Refill   amLODipine-benazepril (LOTREL) 5-10 MG capsule TAKE 1 CAPSULE BY MOUTH EVERY DAY 90 capsule 2   Artificial Tear Solution (SOOTHE XP) SOLN Place 1 drop into both eyes 3 (three) times daily as needed (for dryness).     augmented betamethasone dipropionate (DIPROLENE-AF) 0.05 % cream      Azelastine HCl 137 MCG/SPRAY SOLN PLACE 1-2 SPRAYS INTO BOTH NOSTRILS 2 (TWO) TIMES DAILY AS NEEDED FOR RHINITIS. 137 mL 12   Cephalexin 500 MG tablet Take 1 tablet every 6 hours by oral route as directed for 10 days, for left lower eyelid infection.     clobetasol (OLUX) 0.05 % topical foam Apply 1 application  topically daily as needed (scalp itching).     Cyanocobalamin (B-12) 1000 MCG SUBL Place 1,000 mcg under the tongue daily.     erythromycin ophthalmic ointment APPLY 1 CM RIBBON INTO THE LOWER CONJUNCTIVAL SAC IN THE LEFT EYE AND OVER SURGICAL SITE 4 TIMES PER DAY     escitalopram (LEXAPRO) 10 MG tablet Take 1 tablet (10 mg total) by mouth daily. 90 tablet 2   fexofenadine (ALLEGRA) 180 MG tablet Take 180 mg by mouth daily.     fluticasone (FLONASE) 50 MCG/ACT nasal spray SPRAY 2 SPRAYS INTO EACH NOSTRIL EVERY DAY 16 mL 12   ibuprofen (ADVIL) 800 MG tablet Take 1 tablet (800 mg total) by mouth every 8 (eight) hours as needed. 90 tablet 2   Ipratropium-Albuterol (COMBIVENT RESPIMAT) 20-100 MCG/ACT AERS respimat Inhale 1 puff up to four times daily as needed 4 g 11   Olopatadine HCl (PATADAY) 0.7 % SOLN Apply to eye.     PRESCRIPTION MEDICATION CPAP- At bedtime and during any time of rest     tadalafil (CIALIS) 5 MG tablet Take 1 tablet by mouth daily.     triamcinolone cream (KENALOG) 0.1 % Apply 1 application  topically daily as needed (to affected  areas- for itching).     WIXELA INHUB 100-50 MCG/ACT AEPB INHALE 1 PUFF BY MOUTH TWICE A DAY 60 each 6   No facility-administered medications prior to visit.     EXAM:  BP 114/76 (BP Location: Left Arm, Patient Position: Sitting, Cuff Size: Large)   Pulse 65   Temp 97.8 F (36.6 C) (Oral)   Ht 5' 2.5" (1.588 m)   Wt 213 lb 12.8 oz (97 kg)   SpO2 95%   BMI 38.48 kg/m   Body mass index is 38.48 kg/m. Wt Readings from Last 3 Encounters:  06/17/23 213 lb 12.8 oz (97 kg)  04/09/23 207 lb (93.9 kg)  02/11/23 201 lb 3.2 oz (91.3 kg)    Physical Exam: Vital signs reviewed VHQ:IONG is a well-developed well-nourished alert cooperative    who appearsr stated age in no acute distress.  HEENT: normocephalic atraumatic , Eyes: PERRL EOM's full,  left lower eye lid red and droopy Nares: paten,t no deformity discharge or tenderness., Ears: no deformity EAC's clear TMs with normal landmarks. NECK: supple without masses, thyromegaly or bruits. CHEST/PULM:  Clear to auscultation and percussion breath sounds equal no wheeze , rales or rhonchi.  CV: PMI is nondisplaced, S1 S2 no gallops, murmurs, rubs. Peripheral pulses are full without delay.No JVD .  Extremtities:  No clubbing cyanosis or edema, no acute joint swelling or redness no focal atrophy left knee  crepitus and dec rom shoulders esp right   NEURO:  Oriented x3, cranial nerves 3-12 appear to be intact, no obvious focal weakness,gait within normal limits no abnormal reflexes or asymmetrical SKIN: No acute rashes normal turgor, color, senile ecchymosis mostly distal ext and  thigh  compression stockings . PSYCH: Oriented, good eye contact, no obvious depression anxiety, cognition and judgment appear normal.   Lab Results  Component Value Date   WBC 6.5 02/11/2023   HGB 13.8 02/11/2023   HCT 40.3 02/11/2023   PLT 174.0 02/11/2023   GLUCOSE 96 02/11/2023   CHOL 175 02/11/2023   TRIG 76.0 02/11/2023   HDL 46.40 02/11/2023   LDLCALC  114 (H) 02/11/2023   ALT 18 02/11/2023   AST 19 02/11/2023   NA 136 02/11/2023   K 3.8 02/11/2023   CL 101 02/11/2023   CREATININE 0.70 02/11/2023   BUN 10 02/11/2023   CO2 28 02/11/2023   TSH 2.22 02/11/2023   PSA 13.58 (H) 02/11/2023   INR 1.0 12/24/2020   HGBA1C 5.5 02/11/2023    BP Readings from Last 3 Encounters:  06/17/23 114/76  04/09/23 139/77  02/11/23 110/70      ASSESSMENT AND PLAN:  Discussed the following assessment and plan:    ICD-10-CM   1. Essential hypertension  I10 ECHOCARDIOGRAM COMPLETE    2. Thrombocytopenia (HCC)  D69.6     3. Medication management  Z79.899     4. Decreased exercise tolerance  R68.89 ECHOCARDIOGRAM COMPLETE   prob from deconditioning  but check out cv causes. denies  .  aggravation or prespiratory issues    5. Left knee pain, unspecified chronicity  M25.562    post surgery   had done well and not now agree with iortho fu    6. Eyelid disorder  H02.9     7. Senile purpura (HCC)  D69.2     8. Asthma, mild intermittent, well-controlled  J45.20     Multiple surgeries and trying to get back  exercise tolerance   check echo and if needed can do more evaluation  but certainly could be  last years  ortho issues  Return in about 4 months (around 10/17/2023).  Patient Care Team: Lilliemae Fruge, [name], MD as PCP - General orthopedic Jerilee Field, MD as Attending Physician (Urology) Waymon Budge, MD as Attending Physician (Pulmonary Disease) Sherrill Raring, [facility] (Pharmacist) Patient Instructions   Good to see you today  Although exam is good  will order   echo test to check heart function.  Depending on results and how doing.  If going to major surgery I may have you see cardiology for clearance.   Fu in   4 mos or as needed   [name]. Knut Rondinelli M.D.

## 2023-06-17 NOTE — Patient Instructions (Addendum)
  Good to see you today  Although exam is good  will order   echo test to check heart function.  Depending on results and how doing.  If going to major surgery I may have you see cardiology for clearance.   Fu in   4 mos or as needed

## 2023-06-22 ENCOUNTER — Ambulatory Visit: Payer: Self-pay

## 2023-06-22 ENCOUNTER — Emergency Department (HOSPITAL_COMMUNITY)
Admission: EM | Admit: 2023-06-22 | Discharge: 2023-06-22 | Disposition: A | Discharge status: Home / Self Care | Attending: Emergency Medicine | Admitting: Emergency Medicine

## 2023-06-22 DIAGNOSIS — Z79899 Other long term (current) drug therapy: Secondary | ICD-10-CM | POA: Diagnosis not present

## 2023-06-22 DIAGNOSIS — I10 Essential (primary) hypertension: Secondary | ICD-10-CM | POA: Insufficient documentation

## 2023-06-22 DIAGNOSIS — H16212 Exposure keratoconjunctivitis, left eye: Secondary | ICD-10-CM | POA: Diagnosis not present

## 2023-06-22 DIAGNOSIS — S81811A Laceration without foreign body, right lower leg, initial encounter: Secondary | ICD-10-CM | POA: Insufficient documentation

## 2023-06-22 DIAGNOSIS — W268XXA Contact with other sharp object(s), not elsewhere classified, initial encounter: Secondary | ICD-10-CM | POA: Insufficient documentation

## 2023-06-22 DIAGNOSIS — Z23 Encounter for immunization: Secondary | ICD-10-CM | POA: Insufficient documentation

## 2023-06-22 DIAGNOSIS — H02115 Cicatricial ectropion of left lower eyelid: Secondary | ICD-10-CM | POA: Diagnosis not present

## 2023-06-22 MED ORDER — TETANUS-DIPHTH-ACELL PERTUSSIS 5-2.5-18.5 LF-MCG/0.5 IM SUSY
0.5000 mL | PREFILLED_SYRINGE | Freq: Once | INTRAMUSCULAR | Status: AC
  Administered 2023-06-22: 0.5 mL via INTRAMUSCULAR
  Filled 2023-06-22: qty 0.5

## 2023-06-22 MED ORDER — LIDOCAINE-EPINEPHRINE (PF) 2 %-1:200000 IJ SOLN
10.0000 mL | Freq: Once | INTRAMUSCULAR | Status: AC
  Administered 2023-06-22: 10 mL via INTRADERMAL
  Filled 2023-06-22: qty 20

## 2023-06-22 NOTE — Telephone Encounter (Signed)
  Chief Complaint: cut leg on metal.  Symptoms: uncontrolled bleeding Frequency: today Pertinent Negatives: Patient denies feeling faint, dizzy, lightheaded, blood thinners use Disposition: [x] ED /[x] Urgent Care (no appt availability in office) / [] Appointment(In office/virtual)/ []  [place] [facility]/ [] Home Care/ [] Refused Recommended Disposition /[] [place] [facility]/ []  Follow-up with PCP Additional Notes: Pt states he cut his leg on a metal piece of bed frame.  Pt states that he covered the wound with a 4x4 and it soaked through and has recovered with another bandage and it is soaking through again. Pt does not take blood thinners, pt does not appear to be UTD on his tdap. Pt advised that he needs to seek eval and treatment at the closest [facility] or ED facility. Pt understands. Pt denies being lightheaded, dizzy, advised if that changes to call 911. Pt agreeable.   Copied from CRM [id number]. Topic: Clinical - Red Word Triage >> Jun 22, 2023  3:00 PM Rodman Pickle T wrote: Red Word that prompted transfer to Nurse Triage: patient has a big gash on his lower right leg front part of his leg it was  cut by a piece of sharp metal while moving the mattress he is bleeding he wrapped it up and is still bleeding Reason for Disposition  [1] Bleeding AND [2] won't stop after 10 minutes of direct pressure (using correct technique)  Answer Assessment - Initial Assessment Questions 3. BLEEDING: "Is it bleeding now?" If Yes, ask: "Is it difficult to stop?"      Bleeding has not stopped at this time 4. LOCATION: "Where is the injury located?"      leg 5. ONSET: "How long ago did the injury occur?"      today 6. MECHANISM: "Tell me how it happened."      Cut on bed frame, metal 7. TETANUS: "When was the last tetanus booster?"     2011  Protocols used: Cuts and Lacerations-A-AH

## 2023-06-22 NOTE — Discharge Instructions (Addendum)
 You have been evaluated for your skin injury.  You have suffered a large skin tear to your lower leg that was managed in the ER.  Please keep this area dry for the next several days and monitor for any signs of infection.  It appears to your doctor have prescribed cephalexin 500 mg tablet for your lower eye infection.  This antibiotic will also treat for skin infection of your leg if necessary.  Therefore you do not need any additional antibiotic.  Return to ER if you have any concern.

## 2023-06-22 NOTE — ED Triage Notes (Signed)
 Hit right lower leg on a bed frame, 2 inch laceration.

## 2023-06-22 NOTE — ED Provider Notes (Signed)
 [place] EMERGENCY DEPARTMENT AT [facility] Provider Note   CSN: [id number] Arrival date & time: 06/22/23  1523     History  Chief Complaint  Patient presents with   Laceration    [name] is a 73 y.o. male.  The history is provided by the patient. No language interpreter was used.  Laceration Associated symptoms: no fever      73 year old male history of osteoarthritis, hypertension, anxiety, presenting for evaluation of leg injury.  Patient report a few hours ago he was navigating around his bed when he accidentally rammed his right lower extremity against the metal frame causing a cut to his leg.  States it bleed a moderate amount and he was able to apply a dressing and came here.  Currently rates pain as 3 out of 10 nonradiating without any numbness.  He is unsure his last tetanus.  He is not on any blood thinner medication.  He denies any other injury.  Home Medications Prior to Admission medications   Medication Sig Start Date End Date Taking? Authorizing Provider  amLODipine-benazepril (LOTREL) 5-10 MG capsule TAKE 1 CAPSULE BY MOUTH EVERY DAY 06/10/23   Panosh, [name], MD  Artificial Tear Solution (SOOTHE XP) SOLN Place 1 drop into both eyes 3 (three) times daily as needed (for dryness).    [provider]  augmented betamethasone dipropionate (DIPROLENE-AF) 0.05 % cream     [provider]  Azelastine HCl 137 MCG/SPRAY SOLN PLACE 1-2 SPRAYS INTO BOTH NOSTRILS 2 (TWO) TIMES DAILY AS NEEDED FOR RHINITIS. 01/10/22   Jetty Duhamel D, MD  Cephalexin 500 MG tablet Take 1 tablet every 6 hours by oral route as directed for 10 days, for left lower eyelid infection. 11/13/22   [provider]  clobetasol (OLUX) 0.05 % topical foam Apply 1 application  topically daily as needed (scalp itching).    [provider]  Cyanocobalamin (B-12) 1000 MCG SUBL Place 1,000 mcg under the tongue daily.    [provider]   erythromycin ophthalmic ointment APPLY 1 CM RIBBON INTO THE LOWER CONJUNCTIVAL SAC IN THE LEFT EYE AND OVER SURGICAL SITE 4 TIMES PER DAY 11/26/22   [provider]  escitalopram (LEXAPRO) 10 MG tablet Take 1 tablet (10 mg total) by mouth daily. 06/10/23   Panosh, [name], MD  fexofenadine (ALLEGRA) 180 MG tablet Take 180 mg by mouth daily.    [provider]  fluticasone (FLONASE) 50 MCG/ACT nasal spray SPRAY 2 SPRAYS INTO EACH NOSTRIL EVERY DAY 07/16/22   Jetty Duhamel D, MD  ibuprofen (ADVIL) 800 MG tablet Take 1 tablet (800 mg total) by mouth every 8 (eight) hours as needed. 06/10/23   Panosh, [name], MD  Ipratropium-Albuterol (COMBIVENT RESPIMAT) 20-100 MCG/ACT AERS respimat Inhale 1 puff up to four times daily as needed 01/23/23   Jetty Duhamel D, MD  Olopatadine HCl (PATADAY) 0.7 % SOLN Apply to eye.    [provider]  PRESCRIPTION MEDICATION CPAP- At bedtime and during any time of rest    [provider]  tadalafil (CIALIS) 5 MG tablet Take 1 tablet by mouth daily. 08/22/22   [provider]  triamcinolone cream (KENALOG) 0.1 % Apply 1 application  topically daily as needed (to affected areas- for itching).    [provider]  Monte Fantasia INHUB 100-50 MCG/ACT AEPB INHALE 1 PUFF BY MOUTH TWICE A DAY 10/07/22   Panosh, [name], MD      Allergies  Tape and Egg-derived products    Review of Systems   Review of Systems  Constitutional:  Negative for fever.  Skin:  Positive for wound.    Physical Exam Updated Vital Signs BP (!) 144/109   Pulse 66   Temp 98.8 F (37.1 C)   Resp 18   SpO2 98%  Physical Exam Vitals and nursing note reviewed.  Constitutional:      General: He is not in acute distress.    Appearance: He is well-developed.  HENT:     Head: Atraumatic.  Eyes:     Conjunctiva/sclera: Conjunctivae normal.  Musculoskeletal:     Cervical back: Neck supple.  Skin:    Findings: No rash.     Comments: Right lower  extremity: 8 cm V-shaped skin tear noted to the anterior tib-fib not actively bleeding no foreign body noted.  Sensation is intact distally.  Peripheral edema noted to bilateral lower extremities  Neurological:     Mental Status: He is alert.     ED Results / Procedures / Treatments   Labs (all labs ordered are listed, but only abnormal results are displayed) Labs Reviewed - No data to display  EKG None  Radiology No results found.  Procedures .Laceration Repair  Date/Time: 06/22/2023 5:07 PM  Performed by: Fayrene Helper, PA-C Authorized by: Fayrene Helper, PA-C   Consent:    Consent obtained:  Verbal   Consent given by:  Patient   Risks, benefits, and alternatives were discussed: yes     Risks discussed:  Infection and poor wound healing   Alternatives discussed:  No treatment Universal protocol:    Procedure explained and questions answered to patient or proxy's satisfaction: yes     Relevant documents present and verified: yes     Patient identity confirmed:  Verbally with patient and arm band Anesthesia:    Anesthesia method:  Topical application   Topical anesthesia: lidocaine 2% Laceration details:    Location:  Leg   Leg location:  R lower leg   Length (cm):  8   Depth (mm):  2 Pre-procedure details:    Preparation:  Patient was prepped and draped in usual sterile fashion Exploration:    Limited defect created (wound extended): no     Hemostasis achieved with:  Direct pressure   Imaging outcome: foreign body not noted     Wound exploration: wound explored through full range of motion and entire depth of wound visualized     Wound extent: no signs of injury, no nerve damage, no tendon damage, no underlying fracture and no vascular damage     Contaminated: no   Treatment:    Area cleansed with:  Saline   Amount of cleaning:  Standard   Irrigation solution:  Sterile saline   Irrigation method:  Pressure wash   Debridement:  None   Undermining:  None   Scar  revision: no   Skin repair:    Repair method:  Tissue adhesive (dermaclip (5), sterile strip (5)) Repair type:    Repair type:  Intermediate Post-procedure details:    Dressing:  Non-adherent dressing   Procedure completion:  Tolerated well, no immediate complications     Medications Ordered in ED Medications  Tdap (BOOSTRIX) injection 0.5 mL (0.5 mLs Intramuscular Given 06/22/23 1631)  lidocaine-EPINEPHrine (XYLOCAINE W/EPI) 2 %-1:200000 (PF) injection 10 mL (10 mLs Intradermal Given by Other 06/22/23 1634)    ED Course/ Medical Decision Making/ A&P  Medical Decision Making Risk Prescription drug management.   BP (!) 144/109   Pulse 66   Temp 98.8 F (37.1 C)   Resp 18   SpO2 98%   18:22 PM 73 year old male history of osteoarthritis, hypertension, anxiety, presenting for evaluation of leg injury.  Patient report a few hours ago he was navigating around his bed when he accidentally rammed his right lower extremity against the metal frame causing a cut to his leg.  States it bleed a moderate amount and he was able to apply a dressing and came here.  Currently rates pain as 3 out of 10 nonradiating without any numbness.  He is unsure his last tetanus.  He is not on any blood thinner medication.  He denies any other injury.   Exam notable for a large V-shaped skin tear to the right lower extremity.  I irrigated the wound, and apply a double clipped to help approximate the skin.  The wound was then wrapped with Kerlix and Coban.  Appropriate wound care instruction provided.  Given his age, and comorbidity, I did prescribe antibiotic to have on hand in the event that patient developed signs of infection which may be able to use the antibiotic as appropriate.  Wound care instruction provided.  Return precaution given.  Imaging including x-ray of right lower extremity including tib-fib considered but not performed as I have low suspicion for bony injury.   Social determinant health including stress.         Final Clinical Impression(s) / ED Diagnoses Final diagnoses:  Skin tear of lower leg without complication, right, initial encounter    Rx / DC Orders ED Discharge Orders     None         Fayrene Helper, PA-C 06/22/23 1716    Bethann Berkshire, MD 06/23/23 1043

## 2023-06-23 ENCOUNTER — Encounter: Payer: Self-pay | Admitting: Podiatry

## 2023-06-23 ENCOUNTER — Ambulatory Visit: Payer: Medicare PPO | Admitting: Podiatry

## 2023-06-23 DIAGNOSIS — M205X9 Other deformities of toe(s) (acquired), unspecified foot: Secondary | ICD-10-CM

## 2023-06-23 DIAGNOSIS — M79674 Pain in right toe(s): Secondary | ICD-10-CM | POA: Diagnosis not present

## 2023-06-23 DIAGNOSIS — B351 Tinea unguium: Secondary | ICD-10-CM | POA: Diagnosis not present

## 2023-06-23 DIAGNOSIS — M79675 Pain in left toe(s): Secondary | ICD-10-CM

## 2023-06-23 NOTE — Progress Notes (Signed)
 This patient presents to the office with chief complaint of long thick painful nails.  Patient says the nails are painful walking and wearing shoes.  This patient is unable to self treat.  This patient is unable to trim his  nails since he is unable to reach his nails.  he presents to the office for preventative foot care services. Patient had eye surgery and leg laceration recently.  General Appearance  Alert, conversant and in no acute stress.  Vascular  Dorsalis pedis and posterior tibial  pulses are palpable  bilaterally.  Capillary return is within normal limits  bilaterally. Temperature is within normal limits  bilaterally.  Neurologic  Senn-Weinstein monofilament wire test within normal limits  bilaterally. Muscle power within normal limits bilaterally.  Nails Thick disfigured discolored nails with subungual debris  from hallux to fifth toes bilaterally. No evidence of bacterial infection or drainage bilaterally.  Orthopedic  No limitations of motion  feet .  No crepitus or effusions noted.  No bony pathology or digital deformities noted.  Skin  normotropic skin with no porokeratosis noted bilaterally.  No signs of infections or ulcers noted.     Onychomycosis  Nails  B/L.  Pain in right toes  Pain in left toes  Debridement of nails both feet followed trimming the nails with dremel tool.    RTC 4 months.   Helane Gunther DPM

## 2023-06-24 DIAGNOSIS — Z96652 Presence of left artificial knee joint: Secondary | ICD-10-CM | POA: Diagnosis not present

## 2023-06-24 DIAGNOSIS — M25862 Other specified joint disorders, left knee: Secondary | ICD-10-CM | POA: Diagnosis not present

## 2023-06-24 DIAGNOSIS — M25562 Pain in left knee: Secondary | ICD-10-CM | POA: Diagnosis not present

## 2023-06-29 DIAGNOSIS — H02115 Cicatricial ectropion of left lower eyelid: Secondary | ICD-10-CM | POA: Diagnosis not present

## 2023-06-29 DIAGNOSIS — H0279 Other degenerative disorders of eyelid and periocular area: Secondary | ICD-10-CM | POA: Diagnosis not present

## 2023-06-29 DIAGNOSIS — Z09 Encounter for follow-up examination after completed treatment for conditions other than malignant neoplasm: Secondary | ICD-10-CM | POA: Diagnosis not present

## 2023-06-29 DIAGNOSIS — H02112 Cicatricial ectropion of right lower eyelid: Secondary | ICD-10-CM | POA: Diagnosis not present

## 2023-07-01 ENCOUNTER — Ambulatory Visit: Payer: Self-pay

## 2023-07-01 ENCOUNTER — Encounter: Payer: Self-pay | Admitting: Internal Medicine

## 2023-07-01 ENCOUNTER — Ambulatory Visit: Admitting: Internal Medicine

## 2023-07-01 VITALS — BP 122/64 | HR 69 | Temp 97.4°F | Ht 62.5 in | Wt 212.9 lb

## 2023-07-01 DIAGNOSIS — S81811D Laceration without foreign body, right lower leg, subsequent encounter: Secondary | ICD-10-CM

## 2023-07-01 DIAGNOSIS — S81811S Laceration without foreign body, right lower leg, sequela: Secondary | ICD-10-CM | POA: Diagnosis not present

## 2023-07-01 DIAGNOSIS — G4733 Obstructive sleep apnea (adult) (pediatric): Secondary | ICD-10-CM

## 2023-07-01 NOTE — Progress Notes (Signed)
 Chief Complaint  Patient presents with   Follow-up    Pt reports he wore patch on eye after surgery. Vision was off. Tearing skin  on R lower leg on old metal frame. Went to ER on 3/24.  Haven't shower over a week, keeping wound dry. No drainage, no redness.  Pt states needs guidance on the wound and his eyes.    Insomnia    Pt reports he has trouble sleeping last night.     HPI: [name] 73 y.o. come in for fu  ed visit   sustained deep laceration avulsion injury to r left shin  required  cleaning, tdap and wrapping  and compression at ed.  Is now 9 days out and asks about fu  pain is better  had wrapped in coban  but hasn't taken shower yet.   Still recovering from left eye lid surgery  needed restricting  doing better but hasn't been able to sue cpap so sleep is disrupted even with sleep upright in chair .   Plan arthro clean out left knee per Dr.  Charlann Boxer in May .  Hoping to eventually get back on the bike cycling.  ROS: See pertinent positives and negatives per HPI.  Past Medical History:  Diagnosis Date   Allergy    Anxiety    Arthritis    knees   Asthma    Depression    Dysrhythmia    RBBB   Elevated PSA    [place] and bx negative 2011   Hypertension    nl cardiolite in 1998   Pneumonia 2005   right hospitalized    Rhinosinusitis    Sleep apnea    wears CPAP    Family History  Problem Relation Age of Onset   Heart failure Father        MV replacement   Myelodysplastic syndrome Father    Asthma Mother    Polycythemia Brother    Asthma Other    Hypertension Other    Diabetes Maternal Grandmother    Healthy Sister    Healthy Brother    Healthy Brother    Colon cancer Neg Hx    Pancreatic cancer Neg Hx    Stomach cancer Neg Hx    Esophageal cancer Neg Hx    Colon polyps Neg Hx    Rectal cancer Neg Hx     Social History   Socioeconomic History   Marital status: Single    Spouse name: Not on file   Number of children: Not on file   Years of  education: Not on file   Highest education level: Bachelor's degree (e.g., BA, AB, BS)  Occupational History   Occupation: Higher education careers adviser    Comment: 40 hours  Tobacco Use   Smoking status: Never   Smokeless tobacco: Never  Vaping Use   Vaping status: Never Used  Substance and Sexual Activity   Alcohol use: No    Comment: Quit drinking ETOH 1986   Drug use: No   Sexual activity: Not Currently  Other Topics Concern   Not on file  Social History Narrative    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   Stopped drinking in 1986    Nonosmoker    Single   HHof 1    Social Drivers of Health   Financial Resource Strain: Low Risk  (06/16/2023)   Overall Financial Resource Strain (CARDIA)    Difficulty of Paying Living Expenses: Not very hard  Food  Insecurity: No Food Insecurity (06/16/2023)   Hunger Vital Sign    Worried About Running Out of Food in the Last Year: Never true    Ran Out of Food in the Last Year: Never true  Transportation Needs: No Transportation Needs (06/16/2023)   PRAPARE - Administrator, Civil Service (Medical): No    Lack of Transportation (Non-Medical): No  Physical Activity: Sufficiently Active (06/16/2023)   Exercise Vital Sign    Days of Exercise per Week: 2 days    Minutes of Exercise per Session: 90 min  Stress: Stress Concern Present (06/16/2023)   [employer] of Occupational Health - Occupational Stress Questionnaire    Feeling of Stress : To some extent  Social Connections: Moderately Isolated (06/16/2023)   Social Connection and Isolation Panel [NHANES]    Frequency of Communication with Friends and Family: More than three times a week    Frequency of Social Gatherings with Friends and Family: Three times a week    Attends Religious Services: Never    Active Member of Clubs or Organizations: Yes    Attends Engineer, structural: More than 4 times per year    Marital Status: Never married    Outpatient  Medications Prior to Visit  Medication Sig Dispense Refill   amLODipine-benazepril (LOTREL) 5-10 MG capsule TAKE 1 CAPSULE BY MOUTH EVERY DAY 90 capsule 2   Artificial Tear Solution (SOOTHE XP) SOLN Place 1 drop into both eyes 3 (three) times daily as needed (for dryness).     augmented betamethasone dipropionate (DIPROLENE-AF) 0.05 % cream      Azelastine HCl 137 MCG/SPRAY SOLN PLACE 1-2 SPRAYS INTO BOTH NOSTRILS 2 (TWO) TIMES DAILY AS NEEDED FOR RHINITIS. 137 mL 12   clobetasol (OLUX) 0.05 % topical foam Apply 1 application  topically daily as needed (scalp itching).     Cyanocobalamin (B-12) 1000 MCG SUBL Place 1,000 mcg under the tongue daily.     erythromycin ophthalmic ointment APPLY 1 CM RIBBON INTO THE LOWER CONJUNCTIVAL SAC IN THE LEFT EYE AND OVER SURGICAL SITE 4 TIMES PER DAY     escitalopram (LEXAPRO) 10 MG tablet Take 1 tablet (10 mg total) by mouth daily. 90 tablet 2   fexofenadine (ALLEGRA) 180 MG tablet Take 180 mg by mouth daily.     fluticasone (FLONASE) 50 MCG/ACT nasal spray SPRAY 2 SPRAYS INTO EACH NOSTRIL EVERY DAY 16 mL 12   ibuprofen (ADVIL) 800 MG tablet Take 1 tablet (800 mg total) by mouth every 8 (eight) hours as needed. 90 tablet 2   Ipratropium-Albuterol (COMBIVENT RESPIMAT) 20-100 MCG/ACT AERS respimat Inhale 1 puff up to four times daily as needed 4 g 11   Olopatadine HCl (PATADAY) 0.7 % SOLN Apply to eye.     PRESCRIPTION MEDICATION CPAP- At bedtime and during any time of rest     tadalafil (CIALIS) 5 MG tablet Take 1 tablet by mouth daily.     triamcinolone cream (KENALOG) 0.1 % Apply 1 application  topically daily as needed (to affected areas- for itching).     WIXELA INHUB 100-50 MCG/ACT AEPB INHALE 1 PUFF BY MOUTH TWICE A DAY 60 each 6   Cephalexin 500 MG tablet Take 1 tablet every 6 hours by oral route as directed for 10 days, for left lower eyelid infection.     No facility-administered medications prior to visit.     EXAM:  BP 122/64 (BP Location:  Right Arm, Patient Position: Sitting, Cuff Size: Large)  Pulse 69   Temp (!) 97.4 F (36.3 C) (Oral)   Ht 5' 2.5" (1.588 m)   Wt 212 lb 14.4 oz (96.6 kg)   SpO2 96%   BMI 38.32 kg/m   Body mass index is 38.32 kg/m.  GENERAL: vitals reviewed and listed above, alert, oriented, appears well hydrated and in no acute distress HEENT: atraumatic, fading ecchymosis left eye  lid  with some edema  no obvious abnormalities on inspection of external nose and ears   NECK: no obvious masses on inspection palpation  MS: moves all extremities rle    linear  striped dry laceration   without redness or streaking  lle some stable edema  1 +  but wound seen seems clean and dry.   Nl gait PSYCH: pleasant and cooperative, no obvious depression or anxiety Lab Results  Component Value Date   WBC 6.5 02/11/2023   HGB 13.8 02/11/2023   HCT 40.3 02/11/2023   PLT 174.0 02/11/2023   GLUCOSE 96 02/11/2023   CHOL 175 02/11/2023   TRIG 76.0 02/11/2023   HDL 46.40 02/11/2023   LDLCALC 114 (H) 02/11/2023   ALT 18 02/11/2023   AST 19 02/11/2023   NA 136 02/11/2023   K 3.8 02/11/2023   CL 101 02/11/2023   CREATININE 0.70 02/11/2023   BUN 10 02/11/2023   CO2 28 02/11/2023   TSH 2.22 02/11/2023   PSA 13.58 (H) 02/11/2023   INR 1.0 12/24/2020   HGBA1C 5.5 02/11/2023   BP Readings from Last 3 Encounters:  07/01/23 122/64  06/22/23 (!) 140/89  06/17/23 114/76   Record review ASSESSMENT AND PLAN:  Discussed the following assessment and plan:  Laceration of right lower extremity, sequela  OSA on CPAP Injury prevention  just had  eye lid surgery  Hopefully can get back on cpap soon to help sleep .  Ok for knee surgery in May  .   He is looking forward to getting back on  bike cycling in future  -Patient advised to return or notify health care team  if  new concerns arise.  Patient Instructions  I agree  keep dry but should be able to take shower   and let the  strips fall off    eventually. Usually keep on 10 - 14 days  or more   . Avoid  tight compression. For now  Let us know if sings of infection.     [name]. Tabor Denham M.D.

## 2023-07-01 NOTE — Telephone Encounter (Signed)
  Chief Complaint: Skin Tear to RLE Symptoms: tender to the touch-dermaclips in place from ED Frequency: injury initially occurred on 06/22/2023 Pertinent Negatives: Patient denies fever Disposition: [] ED /[] Urgent Care (no appt availability in office) / [x] Appointment(In office/virtual)/ []  [facility]/ [] Home Care/ [] Refused Recommended Disposition /[] [facility]/ []  Follow-up with PCP Additional Notes: patient called with needing medical advise after being seen on 06/22/2023 for a large skin tear to right lower extremity. Patient states he cut his leg significantly on a mental bed frame. Patient states he went to ED and was treated. ED provider cleaned area and placed dermaclips to keep skin in place. Patient did receive a tetanus shot and was started on antibiotics. Patient states he has been unable to shower only taking "bird baths" to get clean. Per review of ED discharge instructions, patient was told to keep the area dry for "several days." Patient is needing a ed follow up visit and would like to see his PCP. Appointment available today with PCP at 2:30 PM. Patient agrees to appointment which is made for today at 2:30 PM as a ED follow up visit. Patient verbalized understanding of plan and all questions answered.    Copied from CRM [id number]. Topic: Clinical - Medical Advice >> Jul 01, 2023 11:36 AM [name] wrote: Reason for CRM: Patient got a wound on his lower right leg and was triaged last week for this issue. Patient went to the ED and was just wanting medical advice regarding this. He stated that he has not gotten it wet and has not showered, so just wanting to know how to go about it. Reason for Disposition  [1] Looks infected AND [2] large red area (> 2 inches or 5 cm) or streak  Answer Assessment - Initial Assessment Questions 1. APPEARANCE of INJURY: "What does the injury look like?"      Skin tear to lower right leg 2. SIZE: "How large is the cut?"       Pretty large 3. BLEEDING: "Is it bleeding now?" If Yes, ask: "Is it difficult to stop?"      No bleeding 4. LOCATION: "Where is the injury located?"      Right lower leg 5. ONSET: "How long ago did the injury occur?"      Occurred 06/22/2023 6. MECHANISM: "Tell me how it happened."      Cut his leg on a bed frame 7. TETANUS: "When was the last tetanus booster?"     06/22/2023  Protocols used: Skin Injury-A-AH

## 2023-07-01 NOTE — Patient Instructions (Addendum)
 I agree  keep dry but should be able to take shower   and let the  strips fall off   eventually. Usually keep on 10 - 14 days  or more   . Avoid  tight compression. For now  Let us know if sings of infection.

## 2023-07-02 NOTE — Telephone Encounter (Signed)
 Pt was seen yesterday.

## 2023-07-06 ENCOUNTER — Ambulatory Visit (INDEPENDENT_AMBULATORY_CARE_PROVIDER_SITE_OTHER)

## 2023-07-06 VITALS — Ht 62.5 in | Wt 212.0 lb

## 2023-07-06 DIAGNOSIS — Z0000 Encounter for general adult medical examination without abnormal findings: Secondary | ICD-10-CM | POA: Diagnosis not present

## 2023-07-06 NOTE — Progress Notes (Signed)
 Subjective:   [name] is a 73 y.o. who presents for a Medicare Wellness preventive visit.  Visit Complete: Virtual I connected with  [name] on 07/06/23 by a audio enabled telemedicine application and verified that I am speaking with the correct person using two identifiers.  Patient Location: Home  Provider Location: Home Office  I discussed the limitations of evaluation and management by telemedicine. The patient expressed understanding and agreed to proceed.  Vital Signs: Because this visit was a virtual/telehealth visit, some criteria may be missing or patient reported. Any vitals not documented were not able to be obtained and vitals that have been documented are patient reported.    Persons Participating in Visit: Patient.  AWV Questionnaire: No: Patient Medicare AWV questionnaire was not completed prior to this visit.  Cardiac Risk Factors include: advanced age (>29men, >77 women);male gender;hypertension     Objective:    Today's Vitals   07/06/23 0806 07/06/23 0807  Weight: 212 lb (96.2 kg)   Height: 5' 2.5" (1.588 m)   PainSc:  0-No pain   Body mass index is 38.16 kg/m.     07/06/2023    8:19 AM 06/09/2022    9:04 AM 05/10/2022    7:35 AM 05/07/2022    2:36 PM 05/06/2022    3:56 PM 05/06/2022    3:52 PM 03/14/2022    5:51 AM  Advanced Directives  Does Patient Have a Medical Advance Directive? No No No No No No No  Would patient like information on creating a medical advance directive? No - Patient declined No - Patient declined No - Patient declined No - Patient declined No - Patient declined  No - Patient declined    Current Medications (verified) Outpatient Encounter Medications as of 07/06/2023  Medication Sig   amLODipine-benazepril (LOTREL) 5-10 MG capsule TAKE 1 CAPSULE BY MOUTH EVERY DAY   Artificial Tear Solution (SOOTHE XP) SOLN Place 1 drop into both eyes 3 (three) times daily as needed (for dryness).   augmented betamethasone  dipropionate (DIPROLENE-AF) 0.05 % cream    Azelastine HCl 137 MCG/SPRAY SOLN PLACE 1-2 SPRAYS INTO BOTH NOSTRILS 2 (TWO) TIMES DAILY AS NEEDED FOR RHINITIS.   clobetasol (OLUX) 0.05 % topical foam Apply 1 application  topically daily as needed (scalp itching).   Cyanocobalamin (B-12) 1000 MCG SUBL Place 1,000 mcg under the tongue daily.   erythromycin ophthalmic ointment APPLY 1 CM RIBBON INTO THE LOWER CONJUNCTIVAL SAC IN THE LEFT EYE AND OVER SURGICAL SITE 4 TIMES PER DAY   escitalopram (LEXAPRO) 10 MG tablet Take 1 tablet (10 mg total) by mouth daily.   fexofenadine (ALLEGRA) 180 MG tablet Take 180 mg by mouth daily.   fluticasone (FLONASE) 50 MCG/ACT nasal spray SPRAY 2 SPRAYS INTO EACH NOSTRIL EVERY DAY   ibuprofen (ADVIL) 800 MG tablet Take 1 tablet (800 mg total) by mouth every 8 (eight) hours as needed.   Ipratropium-Albuterol (COMBIVENT RESPIMAT) 20-100 MCG/ACT AERS respimat Inhale 1 puff up to four times daily as needed   Olopatadine HCl (PATADAY) 0.7 % SOLN Apply to eye.   PRESCRIPTION MEDICATION CPAP- At bedtime and during any time of rest   tadalafil (CIALIS) 5 MG tablet Take 1 tablet by mouth daily.   triamcinolone cream (KENALOG) 0.1 % Apply 1 application  topically daily as needed (to affected areas- for itching).   WIXELA INHUB 100-50 MCG/ACT AEPB INHALE 1 PUFF BY MOUTH TWICE A DAY   No facility-administered encounter medications on file as  of 07/06/2023.    Allergies (verified) Tape and Egg-derived products   History: Past Medical History:  Diagnosis Date   Allergy    Anxiety    Arthritis    knees   Asthma    Depression    Dysrhythmia    RBBB   Elevated PSA    [place] and bx negative 2011   Hypertension    nl cardiolite in 1998   Pneumonia 2005   right hospitalized    Rhinosinusitis    Sleep apnea    wears CPAP   Past Surgical History:  Procedure Laterality Date   COLONOSCOPY  03/09/2013   ECTROPION REPAIR Left    2023   EYE SURGERY Left 04/08/2022   to  bring lower eyelid up   FACIAL RECONSTRUCTION SURGERY     1992   HERNIA REPAIR     NASAL SINUS SURGERY     REPLACEMENT TOTAL KNEE  01/2010   left; Dr Charlann Boxer; RIght knee 06-2010   REVERSE SHOULDER ARTHROPLASTY Right 03/14/2022   Procedure: REVERSE SHOULDER ARTHROPLASTY WITH REMOVAL OF ANATOMIC ARTHROPLASTY;  Surgeon: Francena Hanly, MD;  Location: [facility];  Service: Orthopedics;  Laterality: Right;   right shoulder arthroplasty     2018   shoulder atrhroscopic surgery  2011   Right   TOTAL KNEE REVISION Left 12/05/2021   Procedure: TOTAL KNEE REVISION;  Surgeon: [name], MD;  Location: [facility];  Service: Orthopedics;  Laterality: Left;   TOTAL SHOULDER REVISION Right 05/09/2022   Procedure: Revision Right Reverse Shoulder Arthroplasty;  Surgeon: Francena Hanly, MD;  Location: [facility];  Service: Orthopedics;  Laterality: Right;    VENTRAL HERNIA REPAIR  03/2007   rerepair winter 2010   umbilical per pt   Family History  Problem Relation Age of Onset   Heart failure Father        MV replacement   Myelodysplastic syndrome Father    Asthma Mother    Polycythemia Brother    Asthma Other    Hypertension Other    Diabetes Maternal Grandmother    Healthy Sister    Healthy Brother    Healthy Brother    Colon cancer Neg Hx    Pancreatic cancer Neg Hx    Stomach cancer Neg Hx    Esophageal cancer Neg Hx    Colon polyps Neg Hx    Rectal cancer Neg Hx    Social History   Socioeconomic History   Marital status: Single    Spouse name: Not on file   Number of children: Not on file   Years of education: Not on file   Highest education level: Bachelor's degree (e.g., BA, AB, BS)  Occupational History   Occupation: Higher education careers adviser    Comment: 40 hours  Tobacco Use   Smoking status: Never   Smokeless tobacco: Never  Vaping Use   Vaping status: Never Used  Substance and Sexual Activity   Alcohol use: No    Comment: Quit drinking ETOH 1986   Drug use: No   Sexual  activity: Not Currently  Other Topics Concern   Not on file  Social History Narrative    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   Stopped drinking in 1986    Nonosmoker    Single   HHof 1    Social Drivers of Health   Financial Resource Strain: Low Risk  (07/06/2023)   Overall Financial Resource Strain (CARDIA)    Difficulty of Paying Living Expenses: Not  hard at all  Food Insecurity: No Food Insecurity (07/06/2023)   Hunger Vital Sign    Worried About Running Out of Food in the Last Year: Never true    Ran Out of Food in the Last Year: Never true  Transportation Needs: No Transportation Needs (07/06/2023)   PRAPARE - Administrator, Civil Service (Medical): No    Lack of Transportation (Non-Medical): No  Physical Activity: Insufficiently Active (07/06/2023)   Exercise Vital Sign    Days of Exercise per Week: 2 days    Minutes of Exercise per Session: 60 min  Stress: No Stress Concern Present (07/06/2023)   [employer] of Occupational Health - Occupational Stress Questionnaire    Feeling of Stress : Not at all  Recent Concern: Stress - Stress Concern Present (06/16/2023)   [employer] of Occupational Health - Occupational Stress Questionnaire    Feeling of Stress : To some extent  Social Connections: Moderately Integrated (07/06/2023)   Social Connection and Isolation Panel [NHANES]    Frequency of Communication with Friends and Family: More than three times a week    Frequency of Social Gatherings with Friends and Family: More than three times a week    Attends Religious Services: More than 4 times per year    Active Member of [employer] or Organizations: Yes    Attends Engineer, structural: More than 4 times per year    Marital Status: Divorced  Recent Concern: Social Connections - Moderately Isolated (06/16/2023)   Social Connection and Isolation Panel [NHANES]    Frequency of Communication with Friends and Family: More than three times a week     Frequency of Social Gatherings with Friends and Family: Three times a week    Attends Religious Services: Never    Active Member of Clubs or Organizations: Yes    Attends Engineer, structural: More than 4 times per year    Marital Status: Never married    Tobacco Counseling Counseling given: Not Answered    Clinical Intake:  Pre-visit preparation completed: Yes  Pain : No/denies pain Pain Score: 0-No pain     BMI - recorded: 38.16 Nutritional Status: BMI > 30  Obese Nutritional Risks: None Diabetes: No  Lab Results  Component Value Date   HGBA1C 5.5 02/11/2023   HGBA1C 5.5 11/06/2021   HGBA1C 5.5 12/24/2020     How often do you need to have someone help you when you read instructions, pamphlets, or other written materials from your doctor or pharmacy?: 1 - Never  Interpreter Needed?: No  Information entered by :: Theresa Mulligan LPN   Activities of Daily Living     07/06/2023    8:18 AM  In your present state of health, do you have any difficulty performing the following activities:  Hearing? 0  Vision? 0  Difficulty concentrating or making decisions? 0  Walking or climbing stairs? 0  Dressing or bathing? 0  Doing errands, shopping? 0  Preparing Food and eating ? N  Using the Toilet? N  In the past six months, have you accidently leaked urine? N  Do you have problems with loss of bowel control? N  Managing your Medications? N  Managing your Finances? N  Housekeeping or managing your Housekeeping? N    Patient Care Team: Panosh, [name], MD as PCP - General orthopedic Jerilee Field, MD as Attending Physician (Urology) Waymon Budge, MD as Attending Physician (Pulmonary Disease) Leitha Bleak Milas Kocher, [facility] (Pharmacist)  Indicate  any recent Medical Services you may have received from other than [facility] providers in the past year (date may be approximate).     Assessment:   This is a routine wellness examination for [place].  Hearing/Vision  screen Hearing Screening - Comments:: Denies hearing difficulties   Vision Screening - Comments:: Wears rx glasses - up to date with routine eye exams with  Dr Durwin Reges   Goals Addressed               This Visit's Progress     Increase physical activity (pt-stated)         Depression Screen      07/06/2023    8:15 AM 12/31/2022    9:06 AM 06/09/2022    8:58 AM 01/21/2022   11:41 AM 06/06/2021    9:12 AM 06/05/2020   10:40 AM 07/08/2019    9:44 AM  PHQ 2/9 Scores  PHQ - 2 Score 0 0 0 0 0 0 0  PHQ- 9 Score    0   0    Fall Risk      07/06/2023    8:18 AM 12/31/2022    9:06 AM 12/09/2022   10:27 AM 06/09/2022    9:02 AM 06/08/2022    7:50 PM  Fall Risk   Falls in the past year? 0 0 0 0 0  Number falls in past yr: 0 0  0   Injury with Fall? 0 0  0   Risk for fall due to : No Fall Risks   No Fall Risks   Follow up Falls prevention discussed;Falls evaluation completed   Falls prevention discussed     MEDICARE RISK AT HOME:   Medicare Risk at Home Any stairs in or around the home?: Yes If so, are there any without handrails?: No Home free of loose throw rugs in walkways, pet beds, electrical cords, etc?: Yes Adequate lighting in your home to reduce risk of falls?: Yes Life alert?: No Use of a cane, walker or w/c?: No Grab bars in the bathroom?: No Shower chair or bench in shower?: No Elevated toilet seat or a handicapped toilet?: No  TIMED UP AND GO:  Was the test performed?  No  Cognitive Function: 6CIT completed        07/06/2023    8:19 AM 06/09/2022    9:04 AM 06/06/2021    9:17 AM  6CIT Screen  What Year? 0 points 0 points 0 points  What month? 0 points 0 points 0 points  What time? 0 points 0 points 0 points  Count back from 20 0 points 0 points 0 points  Months in reverse 0 points 0 points 0 points  Repeat phrase 0 points 0 points 0 points  Total Score 0 points 0 points 0 points    Immunizations Immunization History  Administered Date(s)  Administered   Fluad Quad(high Dose 65+) 01/11/2019, 01/06/2020, 12/24/2020   H1N1 04/03/2008   Influenza Split 04/08/2011, 02/19/2012, 12/27/2015   Influenza Whole 02/02/2007, 03/14/2009, 01/28/2010   Influenza, High Dose Seasonal PF 12/31/2021, 12/17/2022   Influenza,inj,Quad PF,6+ Mos 12/01/2012, 12/16/2013, 12/20/2014, 12/30/2016, 01/05/2018   Influenza,inj,quad, With Preservative 12/29/2016   Moderna Sars-Covid-2 Vaccination 05/02/2019, 05/31/2019, 01/22/2020, 06/29/2020   PNEUMOCOCCAL CONJUGATE-20 11/06/2021   Pfizer Covid-19 Vaccine Bivalent Booster 51yrs & up 12/24/2020, 11/27/2022   Pneumococcal Conjugate-13 10/19/2013   Pneumococcal Polysaccharide-23 09/17/2009, 11/07/2016   Respiratory Syncytial Virus Vaccine,Recomb Aduvanted(Arexvy) 12/31/2021   Td 09/17/2009   Tdap 06/22/2023   Unspecified  SARS-COV-2 Vaccination 01/08/2022, 09/22/2022   Zoster Recombinant(Shingrix) 11/07/2016, 02/18/2017   Zoster, Live 11/02/2015    Screening Tests Health Maintenance  Topic Date Due   COVID-19 Vaccine (9 - 2024-25 season) 02/11/2024 (Originally 01/22/2023)   INFLUENZA VACCINE  10/30/2023   Medicare Annual Wellness (AWV)  07/05/2024   Colonoscopy  11/07/2027   DTaP/Tdap/Td (3 - Td or Tdap) 06/21/2033   Pneumonia Vaccine 23+ Years old  Completed   Hepatitis C Screening  Completed   Zoster Vaccines- Shingrix  Completed   HPV VACCINES  Aged Out    Health Maintenance  There are no preventive care reminders to display for this patient. Health Maintenance Items Addressed:    Additional Screening:  Vision Screening: Recommended annual ophthalmology exams for early detection of glaucoma and other disorders of the eye.  Dental Screening: Recommended annual dental exams for proper oral hygiene  Community Resource Referral / Chronic Care Management: CRR required this visit?  No   CCM required this visit?  No     Plan:     I have personally reviewed and noted the following in  the patient's chart:   Medical and social history Use of alcohol, tobacco or illicit drugs  Current medications and supplements including opioid prescriptions. Patient is not currently taking opioid prescriptions. Functional ability and status Nutritional status Physical activity Advanced directives List of other physicians Hospitalizations, surgeries, and ER visits in previous 12 months Vitals Screenings to include cognitive, depression, and falls Referrals and appointments  In addition, I have reviewed and discussed with patient certain preventive protocols, quality metrics, and best practice recommendations. A written personalized care plan for preventive services as well as general preventive health recommendations were provided to patient.     Tillie Rung, LPN   04/02/2438   After Visit Summary: (MyChart) Due to this being a telephonic visit, the after visit summary with patients personalized plan was offered to patient via MyChart   Notes: Nothing significant to report at this time.

## 2023-07-06 NOTE — Patient Instructions (Addendum)
 [name] , Thank you for taking time to come for your Medicare Wellness Visit. I appreciate your ongoing commitment to your health goals. Please review the following plan we discussed and let me know if I can assist you in the future.   Referrals/Orders/Follow-Ups/Clinician Recommendations:   This is a list of the screening recommended for you and due dates:  Health Maintenance  Topic Date Due   COVID-19 Vaccine (9 - 2024-25 season) 02/11/2024*   Flu Shot  10/30/2023   Medicare Annual Wellness Visit  07/05/2024   Colon Cancer Screening  11/07/2027   DTaP/Tdap/Td vaccine (3 - Td or Tdap) 06/21/2033   Pneumonia Vaccine  Completed   Hepatitis C Screening  Completed   Zoster (Shingles) Vaccine  Completed   HPV Vaccine  Aged Out  *Topic was postponed. The date shown is not the original due date.    Advanced directives: (Declined) Advance directive discussed with you today. Even though you declined this today, please call our office should you change your mind, and we can give you the proper paperwork for you to fill out.  Next Medicare Annual Wellness Visit scheduled for next year: Yes

## 2023-07-08 DIAGNOSIS — F4323 Adjustment disorder with mixed anxiety and depressed mood: Secondary | ICD-10-CM | POA: Diagnosis not present

## 2023-07-14 ENCOUNTER — Ambulatory Visit (HOSPITAL_COMMUNITY): Attending: Cardiology

## 2023-07-14 DIAGNOSIS — I10 Essential (primary) hypertension: Secondary | ICD-10-CM | POA: Diagnosis not present

## 2023-07-14 DIAGNOSIS — R6889 Other general symptoms and signs: Secondary | ICD-10-CM | POA: Diagnosis not present

## 2023-07-14 LAB — ECHOCARDIOGRAM COMPLETE
Area-P 1/2: 2.84 cm2
P 1/2 time: 309 ms
S' Lateral: 2.8 cm

## 2023-07-16 ENCOUNTER — Other Ambulatory Visit: Payer: Self-pay | Admitting: Internal Medicine

## 2023-07-27 DIAGNOSIS — R972 Elevated prostate specific antigen [PSA]: Secondary | ICD-10-CM | POA: Diagnosis not present

## 2023-07-27 LAB — PSA: PSA: 6.58

## 2023-07-30 DIAGNOSIS — R972 Elevated prostate specific antigen [PSA]: Secondary | ICD-10-CM | POA: Diagnosis not present

## 2023-07-30 DIAGNOSIS — R3912 Poor urinary stream: Secondary | ICD-10-CM | POA: Diagnosis not present

## 2023-07-30 DIAGNOSIS — N401 Enlarged prostate with lower urinary tract symptoms: Secondary | ICD-10-CM | POA: Diagnosis not present

## 2023-07-31 ENCOUNTER — Telehealth: Payer: Self-pay

## 2023-07-31 NOTE — Telephone Encounter (Signed)
 Copied from CRM [id number]. Topic: Clinical - Lab/Test Results >> Jul 31, 2023  2:21 PM Chuck Crater wrote: Reason for CRM: Patient has question in regards to Echocardiogram results.

## 2023-08-03 ENCOUNTER — Encounter: Payer: Self-pay | Admitting: Internal Medicine

## 2023-08-06 NOTE — Patient Instructions (Signed)
 SURGICAL WAITING ROOM VISITATION  Patients having surgery or a procedure may have no more than 2 support people in the waiting area - these visitors may rotate.    Children under the age of 51 must have an adult with them who is not the patient.  Due to an increase in RSV and influenza rates and associated hospitalizations, children ages 57 and under may not visit patients in [facility] hospitals.  Visitors with respiratory illnesses are discouraged from visiting and should remain at home.  If the patient needs to stay at the hospital during part of their recovery, the visitor guidelines for inpatient rooms apply. Pre-op nurse will coordinate an appropriate time for 1 support person to accompany patient in pre-op.  This support person may not rotate.    Please refer to the [facility] website for the visitor guidelines for Inpatients (after your surgery is over and you are in a regular room).       Your procedure is scheduled on: 08/18/23   Report to [facility] Main Entrance    Report to admitting at : 1:20 PM   Call this number if you have problems the morning of surgery [phone]   Do not eat food :After Midnight.   After Midnight you may have the following liquids until : 12:25 PM DAY OF SURGERY  Water  Non-Citrus Juices (without pulp, NO RED-Apple, White grape, White cranberry) Black Coffee (NO MILK/CREAM OR CREAMERS, sugar ok)  Clear Tea (NO MILK/CREAM OR CREAMERS, sugar ok) regular and decaf                             Plain Jell-O (NO RED)                                           Fruit ices (not with fruit pulp, NO RED)                                     Popsicles (NO RED)                                                               Sports drinks like Gatorade (NO RED)   The day of surgery:  Drink ONE (1) Pre-Surgery Clear Ensure at : 12:25 PM the morning of surgery. Drink in one sitting. Do not sip.  This drink was given to you during your hospital   pre-op appointment visit. Nothing else to drink after completing the  Pre-Surgery Clear Ensure or G2.          If you have questions, please contact your surgeon's office.  FOLLOW ANY ADDITIONAL PRE OP INSTRUCTIONS YOU RECEIVED FROM YOUR SURGEON'S OFFICE!!!   Oral Hygiene is also important to reduce your risk of infection.                                    Remember - BRUSH YOUR TEETH THE MORNING OF SURGERY WITH YOUR REGULAR TOOTHPASTE  DENTURES WILL BE REMOVED PRIOR TO SURGERY PLEASE DO NOT APPLY "Poly grip" OR ADHESIVES!!!   Do NOT smoke after Midnight   Stop all vitamins and herbal supplements 7 days before surgery.   Take these medicines the morning of surgery with A SIP OF WATER : escitalopram ,fexofenadine,amlodipine .Use inhalers as usual and bring them.Eye drops as usual.  Bring CPAP mask and tubing day of surgery.                              You may not have any metal on your body including hair pins, jewelry, and body piercing             Do not wear lotions, powders, perfumes/cologne, or deodorant              Men may shave face and neck.   Do not bring valuables to the hospital. [facility] IS NOT             RESPONSIBLE   FOR VALUABLES.   Contacts, glasses, dentures or bridgework may not be worn into surgery.   Bring small overnight bag day of surgery.   DO NOT BRING YOUR HOME MEDICATIONS TO THE HOSPITAL. PHARMACY WILL DISPENSE MEDICATIONS LISTED ON YOUR MEDICATION LIST TO YOU DURING YOUR ADMISSION IN THE HOSPITAL!    Patients discharged on the day of surgery will not be allowed to drive home.  Someone NEEDS to stay with you for the first 24 hours after anesthesia.   Special Instructions: Bring a copy of your healthcare power of attorney and living will documents the day of surgery if you haven't scanned them before.              Please read over the following fact sheets you were given: IF YOU HAVE QUESTIONS ABOUT YOUR PRE-OP INSTRUCTIONS PLEASE CALL  [phone]   If you received a COVID test during your pre-op visit  it is requested that you wear a mask when out in public, stay away from anyone that may not be feeling well and notify your surgeon if you develop symptoms. If you test positive for Covid or have been in contact with anyone that has tested positive in the last 10 days please notify you surgeon.    [facility] - Preparing for Surgery Before surgery, you can play an important role.  Because skin is not sterile, your skin needs to be as free of germs as possible.  You can reduce the number of germs on your skin by washing with CHG (chlorahexidine gluconate) soap before surgery.  CHG is an antiseptic cleaner which kills germs and bonds with the skin to continue killing germs even after washing. Please DO NOT use if you have an allergy  to CHG or antibacterial soaps.  If your skin becomes reddened/irritated stop using the CHG and inform your nurse when you arrive at Short Stay. Do not shave (including legs and underarms) for at least 48 hours prior to the first CHG shower.  You may shave your face/neck. Please follow these instructions carefully:  1.  Shower with CHG Soap the night before surgery and the  morning of Surgery.  2.  If you choose to wash your hair, wash your hair first as usual with your  normal  shampoo.  3.  After you shampoo, rinse your hair and body thoroughly to remove the  shampoo.  4.  Use CHG as you would any other liquid soap.  You can apply chg directly  to the skin and wash                       Gently with a scrungie or clean washcloth.  5.  Apply the CHG Soap to your body ONLY FROM THE NECK DOWN.   Do not use on face/ open                           Wound or open sores. Avoid contact with eyes, ears mouth and genitals (private parts).                       Wash face,  Genitals (private parts) with your normal soap.             6.  Wash thoroughly, paying special attention to the area  where your surgery  will be performed.  7.  Thoroughly rinse your body with warm water  from the neck down.  8.  DO NOT shower/wash with your normal soap after using and rinsing off  the CHG Soap.                9.  Pat yourself dry with a clean towel.            10.  Wear clean pajamas.            11.  Place clean sheets on your bed the night of your first shower and do not  sleep with pets. Day of Surgery : Do not apply any lotions/deodorants the morning of surgery.  Please wear clean clothes to the hospital/surgery center.  FAILURE TO FOLLOW THESE INSTRUCTIONS MAY RESULT IN THE CANCELLATION OF YOUR SURGERY PATIENT SIGNATURE_________________________________  NURSE SIGNATURE__________________________________  ________________________________________________________________________  [name]  An incentive spirometer is a tool that can help keep your lungs clear and active. This tool measures how well you are filling your lungs with each breath. Taking long deep breaths may help reverse or decrease the chance of developing breathing (pulmonary) problems (especially infection) following: A long period of time when you are unable to move or be active. BEFORE THE PROCEDURE  If the spirometer includes an indicator to show your best effort, your nurse or respiratory therapist will set it to a desired goal. If possible, sit up straight or lean slightly forward. Try not to slouch. Hold the incentive spirometer in an upright position. INSTRUCTIONS FOR USE  Sit on the edge of your bed if possible, or sit up as far as you can in bed or on a chair. Hold the incentive spirometer in an upright position. Breathe out normally. Place the mouthpiece in your mouth and seal your lips tightly around it. Breathe in slowly and as deeply as possible, raising the piston or the ball toward the top of the column. Hold your breath for 3-5 seconds or for as long as possible. Allow the piston or ball to  fall to the bottom of the column. Remove the mouthpiece from your mouth and breathe out normally. Rest for a few seconds and repeat Steps 1 through 7 at least 10 times every 1-2 hours when you are awake. Take your time and take a few normal breaths between deep breaths. The spirometer may include an indicator to show your best effort. Use the indicator as a goal to work toward during  each repetition. After each set of 10 deep breaths, practice coughing to be sure your lungs are clear. If you have an incision (the cut made at the time of surgery), support your incision when coughing by placing a pillow or rolled up towels firmly against it. Once you are able to get out of bed, walk around indoors and cough well. You may stop using the incentive spirometer when instructed by your caregiver.  RISKS AND COMPLICATIONS Take your time so you do not get dizzy or light-headed. If you are in pain, you may need to take or ask for pain medication before doing incentive spirometry. It is harder to take a deep breath if you are having pain. AFTER USE Rest and breathe slowly and easily. It can be helpful to keep track of a log of your progress. Your caregiver can provide you with a simple table to help with this. If you are using the spirometer at home, follow these instructions: SEEK MEDICAL CARE IF:  You are having difficultly using the spirometer. You have trouble using the spirometer as often as instructed. Your pain medication is not giving enough relief while using the spirometer. You develop fever of 100.5 F (38.1 C) or higher. SEEK IMMEDIATE MEDICAL CARE IF:  You cough up bloody sputum that had not been present before. You develop fever of 102 F (38.9 C) or greater. You develop worsening pain at or near the incision site. MAKE SURE YOU:  Understand these instructions. Will watch your condition. Will get help right away if you are not doing well or get worse. Document Released: 07/28/2006  Document Revised: 06/09/2011 Document Reviewed: 09/28/2006 [facility] Patient Information 2014 [place], [place].   ________________________________________________________________________

## 2023-08-07 ENCOUNTER — Other Ambulatory Visit: Payer: Self-pay

## 2023-08-07 ENCOUNTER — Encounter (HOSPITAL_COMMUNITY): Payer: Self-pay

## 2023-08-07 ENCOUNTER — Encounter (HOSPITAL_COMMUNITY)
Admission: RE | Admit: 2023-08-07 | Discharge: 2023-08-07 | Disposition: A | Discharge status: Home / Self Care | Source: Ambulatory Visit | Attending: Orthopedic Surgery | Admitting: Orthopedic Surgery

## 2023-08-07 VITALS — BP 138/78 | HR 66 | Temp 98.0°F | Ht 62.5 in | Wt 208.0 lb

## 2023-08-07 DIAGNOSIS — I4510 Unspecified right bundle-branch block: Secondary | ICD-10-CM | POA: Diagnosis not present

## 2023-08-07 DIAGNOSIS — J45909 Unspecified asthma, uncomplicated: Secondary | ICD-10-CM | POA: Insufficient documentation

## 2023-08-07 DIAGNOSIS — G4733 Obstructive sleep apnea (adult) (pediatric): Secondary | ICD-10-CM | POA: Insufficient documentation

## 2023-08-07 DIAGNOSIS — F32A Depression, unspecified: Secondary | ICD-10-CM | POA: Insufficient documentation

## 2023-08-07 DIAGNOSIS — Z01818 Encounter for other preprocedural examination: Secondary | ICD-10-CM | POA: Diagnosis not present

## 2023-08-07 DIAGNOSIS — Z96652 Presence of left artificial knee joint: Secondary | ICD-10-CM | POA: Insufficient documentation

## 2023-08-07 DIAGNOSIS — F419 Anxiety disorder, unspecified: Secondary | ICD-10-CM | POA: Insufficient documentation

## 2023-08-07 DIAGNOSIS — I10 Essential (primary) hypertension: Secondary | ICD-10-CM | POA: Diagnosis not present

## 2023-08-07 LAB — CBC
HCT: 41.1 % (ref 39.0–52.0)
Hemoglobin: 14 g/dL (ref 13.0–17.0)
MCH: 36.3 pg — ABNORMAL HIGH (ref 26.0–34.0)
MCHC: 34.1 g/dL (ref 30.0–36.0)
MCV: 106.5 fL — ABNORMAL HIGH (ref 80.0–100.0)
Platelets: 161 10*3/uL (ref 150–400)
RBC: 3.86 MIL/uL — ABNORMAL LOW (ref 4.22–5.81)
RDW: 13.5 % (ref 11.5–15.5)
WBC: 7.7 10*3/uL (ref 4.0–10.5)
nRBC: 0 % (ref 0.0–0.2)

## 2023-08-07 LAB — BASIC METABOLIC PANEL WITH GFR
Anion gap: 9 (ref 5–15)
BUN: 13 mg/dL (ref 8–23)
CO2: 22 mmol/L (ref 22–32)
Calcium: 9.2 mg/dL (ref 8.9–10.3)
Chloride: 104 mmol/L (ref 98–111)
Creatinine, Ser: 0.48 mg/dL — ABNORMAL LOW (ref 0.61–1.24)
GFR, Estimated: >60 mL/min (ref >60)
Glucose, Bld: 90 mg/dL (ref 70–99)
Potassium: 4.2 mmol/L (ref 3.5–5.1)
Sodium: 135 mmol/L (ref 135–145)

## 2023-08-07 NOTE — Progress Notes (Addendum)
 For Anesthesia: PCP - Reginal Capra, MD . Holli Lunger: 07/01/23 Cardiologist - N/A  Bowel Prep reminder:  Chest x-ray -  EKG - 08/07/23 Stress Test -  ECHO - 07/14/23 Cardiac Cath -  Pacemaker/ICD device last checked: Pacemaker orders received: Device Rep notified:  Spinal Cord Stimulator: N/A  Sleep Study - Yes CPAP - Yes  Fasting Blood Sugar -  Checks Blood Sugar _____ times a day Date and result of last Hgb A1c-  Last dose of GLP1 agonist-  N/A GLP1 instructions:   Last dose of SGLT-2 inhibitors-  N/A SGLT-2 instructions:   Blood Thinner Instructions: N/A Aspirin  Instructions: Last Dose:  Activity level: Can go up a flight of stairs and activities of daily living without stopping and without chest pain and/or shortness of breath   Able to exercise without chest pain and/or shortness of breath  Anesthesia review: Hx: HTN,Right BBB, OSA(CPAP).Pt. has a wound on the right lower leg,he said it's a skin tear,he has a dressing on that was applied at the hospital, as per pt., he showed the wound to the PA to the surgeons office, so they are aware.  Patient denies shortness of breath, fever, cough and chest pain at PAT appointment   Patient verbalized understanding of instructions that were given to them at the PAT appointment. Patient was also instructed that they will need to review over the PAT instructions again at home before surgery.

## 2023-08-10 ENCOUNTER — Telehealth: Payer: Self-pay

## 2023-08-10 NOTE — Telephone Encounter (Signed)
 Pt request sent to pcp for advice.

## 2023-08-11 ENCOUNTER — Ambulatory Visit: Admitting: Internal Medicine

## 2023-08-11 ENCOUNTER — Encounter: Payer: Self-pay | Admitting: Internal Medicine

## 2023-08-11 VITALS — BP 120/64 | HR 69 | Temp 97.6°F | Ht 62.5 in | Wt 210.8 lb

## 2023-08-11 DIAGNOSIS — I77810 Thoracic aortic ectasia: Secondary | ICD-10-CM

## 2023-08-11 DIAGNOSIS — Z79899 Other long term (current) drug therapy: Secondary | ICD-10-CM | POA: Diagnosis not present

## 2023-08-11 DIAGNOSIS — F4323 Adjustment disorder with mixed anxiety and depressed mood: Secondary | ICD-10-CM | POA: Diagnosis not present

## 2023-08-11 DIAGNOSIS — R718 Other abnormality of red blood cells: Secondary | ICD-10-CM | POA: Diagnosis not present

## 2023-08-11 DIAGNOSIS — I359 Nonrheumatic aortic valve disorder, unspecified: Secondary | ICD-10-CM

## 2023-08-11 NOTE — Progress Notes (Signed)
 Chief Complaint  Patient presents with   Medical Management of Chronic Issues    To discuss on lab results and on echo reports    HPI: [name] 73 y.o. come in for  revewie of labs and  echo  He has been increasing  activity   before left knee surgery  no bleeding  No cp sob and was able to do 10 k steps  albeit slow . No bleeding  Had a pre op and on antibiotic for healin gshin laceration   Wants to review  information   Father passes at 70 range  and some type of heardt problem and anemia/   Updated psa is ok following one spot  ROS: See pertinent positives and negatives per HPI.  Past Medical History:  Diagnosis Date   Allergy     Anxiety    Arthritis    knees   Asthma    Depression    Dysrhythmia    RBBB   Elevated PSA    us  and bx negative 2011   Hypertension    nl cardiolite in 1998   Pneumonia 2005   right hospitalized    Rhinosinusitis    Sleep apnea    wears CPAP    Family History  Problem Relation Age of Onset   Heart failure Father        MV replacement   Myelodysplastic syndrome Father    Asthma Mother    Polycythemia Brother    Asthma Other    Hypertension Other    Diabetes Maternal Grandmother    Healthy Sister    Healthy Brother    Healthy Brother    Colon cancer Neg Hx    Pancreatic cancer Neg Hx    Stomach cancer Neg Hx    Esophageal cancer Neg Hx    Colon polyps Neg Hx    Rectal cancer Neg Hx     Social History   Socioeconomic History   Marital status: Single    Spouse name: Not on file   Number of children: Not on file   Years of education: Not on file   Highest education level: Bachelor's degree (e.g., BA, AB, BS)  Occupational History   Occupation: Higher education careers adviser    Comment: 40 hours  Tobacco Use   Smoking status: Never   Smokeless tobacco: Never  Vaping Use   Vaping status: Never Used  Substance and Sexual Activity   Alcohol  use: No    Comment: Quit drinking ETOH 1986   Drug use: No   Sexual  activity: Not Currently  Other Topics Concern   Not on file  Social History Narrative    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   Stopped drinking in 1986    Nonosmoker    Single   HHof 1    Social Drivers of Health   Financial Resource Strain: Low Risk  (07/06/2023)   Overall Financial Resource Strain (CARDIA)    Difficulty of Paying Living Expenses: Not hard at all  Food Insecurity: No Food Insecurity (07/06/2023)   Hunger Vital Sign    Worried About Running Out of Food in the Last Year: Never true    Ran Out of Food in the Last Year: Never true  Transportation Needs: No Transportation Needs (07/06/2023)   PRAPARE - Administrator, Civil Service (Medical): No    Lack of Transportation (Non-Medical): No  Physical Activity: Insufficiently Active (07/06/2023)   Exercise Vital Sign  Days of Exercise per Week: 2 days    Minutes of Exercise per Session: 60 min  Stress: No Stress Concern Present (07/06/2023)   [employer] of Occupational Health - Occupational Stress Questionnaire    Feeling of Stress : Not at all  Recent Concern: Stress - Stress Concern Present (06/16/2023)   [employer] of Occupational Health - Occupational Stress Questionnaire    Feeling of Stress : To some extent  Social Connections: Moderately Integrated (07/06/2023)   Social Connection and Isolation Panel [NHANES]    Frequency of Communication with Friends and Family: More than three times a week    Frequency of Social Gatherings with Friends and Family: More than three times a week    Attends Religious Services: More than 4 times per year    Active Member of [employer] or Organizations: Yes    Attends Engineer, structural: More than 4 times per year    Marital Status: Divorced  Recent Concern: Social Connections - Moderately Isolated (06/16/2023)   Social Connection and Isolation Panel [NHANES]    Frequency of Communication with Friends and Family: More than three times a week     Frequency of Social Gatherings with Friends and Family: Three times a week    Attends Religious Services: Never    Active Member of Clubs or Organizations: Yes    Attends Engineer, structural: More than 4 times per year    Marital Status: Never married    Outpatient Medications Prior to Visit  Medication Sig Dispense Refill   amLODipine -benazepril  (LOTREL) 5-10 MG capsule TAKE 1 CAPSULE BY MOUTH EVERY DAY 90 capsule 2   Artificial Tear Solution (SOOTHE XP) SOLN Place 1 drop into both eyes 3 (three) times daily as needed (for dryness).     Azelastine  HCl 137 MCG/SPRAY SOLN PLACE 1-2 SPRAYS INTO BOTH NOSTRILS 2 (TWO) TIMES DAILY AS NEEDED FOR RHINITIS. 137 mL 12   cefadroxil (DURICEF) 500 MG capsule Take 500 mg by mouth 2 (two) times daily.     clobetasol (OLUX) 0.05 % topical foam Apply 1 application  topically daily as needed (scalp itching).     Cyanocobalamin  (B-12) 1000 MCG SUBL Place 1,000 mcg under the tongue daily.     erythromycin ophthalmic ointment Place 1 Application into the left eye 2 (two) times daily.     escitalopram  (LEXAPRO ) 10 MG tablet Take 1 tablet (10 mg total) by mouth daily. 90 tablet 2   fexofenadine (ALLEGRA) 180 MG tablet Take 180 mg by mouth daily.     fluticasone  (FLONASE ) 50 MCG/ACT nasal spray SPRAY 2 SPRAYS INTO EACH NOSTRIL EVERY DAY 48 mL 2   ibuprofen  (ADVIL ) 800 MG tablet Take 1 tablet (800 mg total) by mouth every 8 (eight) hours as needed. 90 tablet 2   Ipratropium-Albuterol  (COMBIVENT  RESPIMAT) 20-100 MCG/ACT AERS respimat Inhale 1 puff up to four times daily as needed 4 g 11   Olopatadine HCl (PATADAY) 0.7 % SOLN Place 1 drop into both eyes daily.     PRESCRIPTION MEDICATION CPAP- At bedtime and during any time of rest     tadalafil (CIALIS) 5 MG tablet Take 1 tablet by mouth daily.     triamcinolone  cream (KENALOG) 0.1 % Apply 1 application  topically daily as needed (itching).     WIXELA INHUB 100-50 MCG/ACT AEPB INHALE 1 PUFF BY MOUTH TWICE  A DAY 60 each 6   No facility-administered medications prior to visit.     EXAM:  BP 120/64 (  BP Location: Right Arm, Patient Position: Sitting, Cuff Size: Large)   Pulse 69   Temp 97.6 F (36.4 C) (Oral)   Ht 5' 2.5" (1.588 m)   Wt 210 lb 12.8 oz (95.6 kg)   SpO2 97%   BMI 37.94 kg/m   Body mass index is 37.94 kg/m.  GENERAL: vitals reviewed and listed above, alert, oriented, appears well hydrated and in no acute distress Looks well ambulatory  NECK: no obvious masses on inspection palpation  LUNGS: clear to auscultation bilaterally, no wheezes, rales or rhonchi, good air movement CV: HRRR, no clubbing cyanosis or  peripheral edema nl cap refill  MS: moves all extremitiesrle healing skin laceration about 2 cm round with  dression  Skin  no petechia  fading senil ecchymosis  forearm  PSYCH: pleasant and cooperative, no obvious depression or anxiety Lab Results  Component Value Date   WBC 7.7 08/07/2023   HGB 14.0 08/07/2023   HCT 41.1 08/07/2023   PLT 161 08/07/2023   GLUCOSE 90 08/07/2023   CHOL 175 02/11/2023   TRIG 76.0 02/11/2023   HDL 46.40 02/11/2023   LDLCALC 114 (H) 02/11/2023   ALT 18 02/11/2023   AST 19 02/11/2023   NA 135 08/07/2023   K 4.2 08/07/2023   CL 104 08/07/2023   CREATININE 0.48 (L) 08/07/2023   BUN 13 08/07/2023   CO2 22 08/07/2023   TSH 2.22 02/11/2023   PSA 6.58 07/27/2023   INR 1.0 12/24/2020   HGBA1C 5.5 02/11/2023   BP Readings from Last 3 Encounters:  08/11/23 120/64  08/07/23 138/78  07/01/23 122/64   Reviewed echo  and labs    nl lv function nl av and mv but degenerative and calciium on tricuspid av  and slight enlargement 43 mm aortic root . ASSESSMENT AND PLAN:  Discussed the following assessment and plan:  Calcification of aortic valve - no as nl lv function and lv - Plan: Ambulatory referral to Cardiology  Aortic root dilatation (HCC) - by echo 43 mm - Plan: Ambulatory referral to Cardiology  Medication  management  Elevated MCV - on going without anemia and platelets are now normal At this time nl cv sx of significance and   doing better with exercise limited with knee . ( Able to do 10 k steps)   Platelets and hg nl  has had elevated MCV  for a while and previous heme consults with Dr Salomon Cree .  Shin laceration healing  Disc of above should not delay surgery    options to see cards early  he and I do not think needed preoperatively based on improvement and status . However  Will do referral for follow  plans for echo findings   disc nl lv function. And no sx at this time .    Expectant management.  -Patient advised to return or notify health care team  if  new concerns arise.  Patient Instructions  Glad your activity tolerance is getting better   . Echo shows som calcium on valve  but no dysfunction and  good  lv muscle function . Eventually want you to see cardiology to follow . Will do  referral  for follow. Over time .  You are not anemic at this time.    Sammy Cassar K. Sabatino Williard M.D.

## 2023-08-11 NOTE — Telephone Encounter (Signed)
 Had appt today

## 2023-08-11 NOTE — Patient Instructions (Addendum)
 Glad your activity tolerance is getting better   . Echo shows som calcium on valve  but no dysfunction and  good  lv muscle function . Eventually want you to see cardiology to follow . Will do  referral  for follow. Over time .  You are not anemic at this time.

## 2023-08-12 ENCOUNTER — Encounter (HOSPITAL_COMMUNITY): Payer: Self-pay

## 2023-08-12 NOTE — Progress Notes (Signed)
 Case: [id number] Date/Time: 08/18/23 1510   Procedure: ARTHROTOMY, KNEE (Left: Knee) - Left knee open scar debridment   Anesthesia type: Spinal   Diagnosis: Other specified joint disorders, unspecified knee [M25.869]   Pre-op diagnosis: Left knee patella clunk status post revision left total knee arthroplasty   Location: [facility] ROOM 09 / [facility]   Surgeons: Claiborne Crew, MD       DISCUSSION: [name] is a 73 year old male who presents to PAT prior to surgery above.  Past medical history significant for hypertension, RBBB, asthma, OSA (uses CPAP), anxiety, depression, arthritis  Patient followed by PCP.  Echo was ordered due to reported decreased exercise tolerance and was done on 07/14/2023 which showed normal LVEF, trivial MR, trivial AI, mild dilation of aortic root (43mm). Referred to Cardiology for this but no other cardiac symptoms noted. Per Dr. Ethel Henry:  "Disc of above should not delay surgery. Option to see cards early, he and I do not think needed preoperatively based on improvement and status. However will do referral for follow up plans for echo findings. Disc nl lv function. And no sx at this time."   Patient recently had eyelid surgery on 3/24  VS: BP 138/78   Pulse 66   Temp 36.7 C (Oral)   Ht 5' 2.5" (1.588 m)   Wt 94.3 kg   SpO2 95%   BMI 37.44 kg/m   PROVIDERS: Panosh, Joaquim Muir, MD   LABS: Labs reviewed: Acceptable for surgery. Has chronically high MCV without anemia, evaluated by heme in the past and is being monitored.  (all labs ordered are listed, but only abnormal results are displayed)  Labs Reviewed  BASIC METABOLIC PANEL WITH GFR - Abnormal; Notable for the following components:      Result Value   Creatinine, Ser 0.48 (*)    All other components within normal limits  CBC - Abnormal; Notable for the following components:   RBC 3.86 (*)    MCV 106.5 (*)    MCH 36.3 (*)    All other components within normal limits     IMAGES:   EKG  08/07/2023  Sinus rhythm with 1st degree A-V block, 61 Right bundle branch block Abnormal ECG When compared with ECG of 22-Nov-2021 15:05, No significant change was found  CV:  Echo 07/14/23 IMPRESSIONS    1. Left ventricular ejection fraction, by estimation, is 60 to 65%. Left ventricular ejection fraction by 3D volume is 63 %. The left ventricle has normal function. The left ventricle has no regional wall motion abnormalities. Left ventricular diastolic  parameters were normal. The average left ventricular global longitudinal strain is -20.8 %. The global longitudinal strain is normal.  2. Right ventricular systolic function is normal. The right ventricular size is normal. There is normal pulmonary artery systolic pressure. The estimated right ventricular systolic pressure is 23.4 mmHg.  3. The mitral valve is degenerative. Trivial mitral valve regurgitation. No evidence of mitral stenosis.  4. The aortic valve is tricuspid. Aortic valve regurgitation is trivial. Aortic valve sclerosis/calcification is present, without any evidence of aortic stenosis.  5. Aortic dilatation noted. There is mild dilatation of the aortic root, measuring 43 mm.  6. The inferior vena cava is normal in size with greater than 50% respiratory variability, suggesting right atrial pressure of 3 mmHg.   Past Medical History:  Diagnosis Date   Allergy     Anxiety    Arthritis    knees   Asthma    Depression  Dysrhythmia    RBBB   Elevated PSA    us  and bx negative 2011   Hypertension    nl cardiolite in 1998   Pneumonia 2005   right hospitalized    Rhinosinusitis    Sleep apnea    wears CPAP    Past Surgical History:  Procedure Laterality Date   COLONOSCOPY  03/09/2013   ECTROPION REPAIR Left    2023   EYE SURGERY Left 04/08/2022   to bring lower eyelid up   FACIAL RECONSTRUCTION SURGERY     1992   HERNIA REPAIR     NASAL SINUS SURGERY     REPLACEMENT TOTAL KNEE  01/2010   left;  Dr Bernard Brick; RIght knee 06-2010   REVERSE SHOULDER ARTHROPLASTY Right 03/14/2022   Procedure: REVERSE SHOULDER ARTHROPLASTY WITH REMOVAL OF ANATOMIC ARTHROPLASTY;  Surgeon: Ellard Gunning, MD;  Location: [facility];  Service: Orthopedics;  Laterality: Right;   right shoulder arthroplasty     2018   shoulder atrhroscopic surgery  2011   Right   TOTAL KNEE REVISION Left 12/05/2021   Procedure: TOTAL KNEE REVISION;  Surgeon: Claiborne Crew, MD;  Location: [facility];  Service: Orthopedics;  Laterality: Left;   TOTAL SHOULDER REVISION Right 05/09/2022   Procedure: Revision Right Reverse Shoulder Arthroplasty;  Surgeon: Ellard Gunning, MD;  Location: [facility];  Service: Orthopedics;  Laterality: Right;    VENTRAL HERNIA REPAIR  03/2007   rerepair winter 2010   umbilical per pt    MEDICATIONS:  amLODipine -benazepril  (LOTREL) 5-10 MG capsule   Artificial Tear Solution (SOOTHE XP) SOLN   Azelastine  HCl 137 MCG/SPRAY SOLN   cefadroxil (DURICEF) 500 MG capsule   clobetasol (OLUX) 0.05 % topical foam   Cyanocobalamin  (B-12) 1000 MCG SUBL   erythromycin ophthalmic ointment   escitalopram  (LEXAPRO ) 10 MG tablet   fexofenadine (ALLEGRA) 180 MG tablet   fluticasone  (FLONASE ) 50 MCG/ACT nasal spray   ibuprofen  (ADVIL ) 800 MG tablet   Ipratropium-Albuterol  (COMBIVENT  RESPIMAT) 20-100 MCG/ACT AERS respimat   Olopatadine HCl (PATADAY) 0.7 % SOLN   PRESCRIPTION MEDICATION   tadalafil (CIALIS) 5 MG tablet   triamcinolone  cream (KENALOG) 0.1 %   WIXELA INHUB 100-50 MCG/ACT AEPB   No current facility-administered medications for this encounter.   Antoinette Kirschner [facility] Short Stay/Anesthesiology [facility] Phone [phone] 08/12/2023 10:05 AM

## 2023-08-12 NOTE — Anesthesia Preprocedure Evaluation (Addendum)
 Anesthesia Evaluation  Patient identified by MRN, date of birth, ID band Patient awake    Reviewed: Allergy  & Precautions, NPO status , Patient's Chart, lab work & pertinent test results  History of Anesthesia Complications Negative for: history of anesthetic complications  Airway Mallampati: II  TM Distance: >3 FB Neck ROM: Full    Dental  (+) Edentulous Upper, Partial Lower, Missing, Dental Advisory Given   Pulmonary asthma , sleep apnea and Continuous Positive Airway Pressure Ventilation , COPD,  COPD inhaler   breath sounds clear to auscultation       Cardiovascular hypertension, Pt. on medications (-) angina  Rhythm:Regular Rate:Normal  06/2023 ECHO: EF 60 to 65%.  1. LV EF 63 %. The left ventricle has normal function, no regional wall motion  abnormalities. Left ventricular diastolic parameters were normal. The average left ventricular global longitudinal strain is -20.8 %. The global longitudinal strain is normal.   2. RVF is normal. The right ventricular size is normal. There is normal pulmonary artery systolic pressure. The estimated right ventricular systolic pressure is 23.4 mmHg.   3. The mitral valve is degenerative. Trivial mitral valve regurgitation. No evidence of mitral stenosis.   4. The aortic valve is tricuspid. Aortic valve regurgitation is trivial. Aortic valve sclerosis/calcification is present, without any evidence of aortic stenosis.     Neuro/Psych   Anxiety Depression       GI/Hepatic negative GI ROS, Neg liver ROS,,,  Endo/Other    Class 3 obesityBMI 38  Renal/GU negative Renal ROS     Musculoskeletal  (+) Arthritis ,    Abdominal   Peds  Hematology Hb 14.0, plt 161k   Anesthesia Other Findings   Reproductive/Obstetrics                             Anesthesia Physical Anesthesia Plan  ASA: 3  Anesthesia Plan: Spinal   Post-op Pain Management: Tylenol  PO  (pre-op)* and Regional block*   Induction:   PONV Risk Score and Plan: 1 and Treatment may vary due to age or medical condition  Airway Management Planned: Natural Airway and Simple Face Mask  Additional Equipment: None  Intra-op Plan:   Post-operative Plan:   Informed Consent: I have reviewed the patients History and Physical, chart, labs and discussed the procedure including the risks, benefits and alternatives for the proposed anesthesia with the patient or authorized representative who has indicated his/her understanding and acceptance.     Dental advisory given  Plan Discussed with: CRNA and Surgeon  Anesthesia Plan Comments: (See PAT note from 5/9 Plan routine monitors, SAB with adductor canal block for post op analgesia)        Anesthesia Quick Evaluation

## 2023-08-18 ENCOUNTER — Encounter (HOSPITAL_COMMUNITY): Payer: Self-pay | Admitting: Orthopedic Surgery

## 2023-08-18 ENCOUNTER — Ambulatory Visit (HOSPITAL_COMMUNITY): Payer: Self-pay | Admitting: Medical

## 2023-08-18 ENCOUNTER — Other Ambulatory Visit: Payer: Self-pay

## 2023-08-18 ENCOUNTER — Observation Stay (HOSPITAL_COMMUNITY)
Admission: RE | Admit: 2023-08-18 | Discharge: 2023-08-19 | Disposition: A | Discharge status: Home / Self Care | Source: Ambulatory Visit | Attending: Orthopedic Surgery | Admitting: Orthopedic Surgery

## 2023-08-18 ENCOUNTER — Encounter (HOSPITAL_COMMUNITY)
Admission: RE | Disposition: A | Discharge status: Home / Self Care | Payer: Self-pay | Source: Ambulatory Visit | Attending: Orthopedic Surgery

## 2023-08-18 ENCOUNTER — Ambulatory Visit (HOSPITAL_BASED_OUTPATIENT_CLINIC_OR_DEPARTMENT_OTHER): Admitting: Anesthesiology

## 2023-08-18 DIAGNOSIS — J449 Chronic obstructive pulmonary disease, unspecified: Secondary | ICD-10-CM

## 2023-08-18 DIAGNOSIS — Z79899 Other long term (current) drug therapy: Secondary | ICD-10-CM | POA: Diagnosis not present

## 2023-08-18 DIAGNOSIS — J45909 Unspecified asthma, uncomplicated: Secondary | ICD-10-CM | POA: Diagnosis not present

## 2023-08-18 DIAGNOSIS — I10 Essential (primary) hypertension: Secondary | ICD-10-CM

## 2023-08-18 DIAGNOSIS — M24662 Ankylosis, left knee: Principal | ICD-10-CM

## 2023-08-18 DIAGNOSIS — Z96611 Presence of right artificial shoulder joint: Secondary | ICD-10-CM | POA: Insufficient documentation

## 2023-08-18 DIAGNOSIS — Z96653 Presence of artificial knee joint, bilateral: Secondary | ICD-10-CM | POA: Insufficient documentation

## 2023-08-18 DIAGNOSIS — F418 Other specified anxiety disorders: Secondary | ICD-10-CM | POA: Diagnosis not present

## 2023-08-18 DIAGNOSIS — T84033A Mechanical loosening of internal left knee prosthetic joint, initial encounter: Secondary | ICD-10-CM | POA: Diagnosis not present

## 2023-08-18 DIAGNOSIS — G8918 Other acute postprocedural pain: Secondary | ICD-10-CM | POA: Diagnosis not present

## 2023-08-18 DIAGNOSIS — Z96652 Presence of left artificial knee joint: Secondary | ICD-10-CM | POA: Diagnosis not present

## 2023-08-18 DIAGNOSIS — M25862 Other specified joint disorders, left knee: Secondary | ICD-10-CM

## 2023-08-18 HISTORY — PX: KNEE ARTHROTOMY: SHX5881

## 2023-08-18 SURGERY — ARTHROTOMY, KNEE
Anesthesia: Spinal | Site: Knee | Laterality: Left

## 2023-08-18 MED ORDER — POLYVINYL ALCOHOL 1.4 % OP SOLN: 1.0000 [drp] | Freq: Three times a day (TID) | OPHTHALMIC | Status: DC | PRN

## 2023-08-18 MED ORDER — DIPHENHYDRAMINE HCL 12.5 MG/5ML PO ELIX: 12.5000 mg | ORAL_SOLUTION | ORAL | Status: DC | PRN

## 2023-08-18 MED ORDER — BUPIVACAINE-EPINEPHRINE (PF) 0.25% -1:200000 IJ SOLN
INTRAMUSCULAR | Status: AC
  Filled 2023-08-18: qty 30

## 2023-08-18 MED ORDER — SODIUM CHLORIDE 0.9% FLUSH: 3.0000 mL | INTRAVENOUS | Status: DC | PRN

## 2023-08-18 MED ORDER — ROPIVACAINE HCL 7.5 MG/ML IJ SOLN
INTRAMUSCULAR | Status: DC | PRN
  Administered 2023-08-18: 20 mL via PERINEURAL

## 2023-08-18 MED ORDER — PROPOFOL 10 MG/ML IV BOLUS
INTRAVENOUS | Status: AC
  Filled 2023-08-18: qty 20

## 2023-08-18 MED ORDER — CEFAZOLIN SODIUM-DEXTROSE 2-4 GM/100ML-% IV SOLN
2.0000 g | INTRAVENOUS | Status: AC
  Administered 2023-08-18: 2 g via INTRAVENOUS
  Filled 2023-08-18: qty 100

## 2023-08-18 MED ORDER — PHENOL 1.4 % MT LIQD: 1.0000 | OROMUCOSAL | Status: DC | PRN

## 2023-08-18 MED ORDER — POVIDONE-IODINE 10 % EX SWAB: 2.0000 | Freq: Once | CUTANEOUS | Status: DC

## 2023-08-18 MED ORDER — SODIUM CHLORIDE (PF) 0.9 % IJ SOLN
INTRAMUSCULAR | Status: AC
  Filled 2023-08-18: qty 30

## 2023-08-18 MED ORDER — MENTHOL 3 MG MT LOZG: 1.0000 | LOZENGE | OROMUCOSAL | Status: DC | PRN

## 2023-08-18 MED ORDER — EPHEDRINE 5 MG/ML INJ
INTRAVENOUS | Status: AC
  Filled 2023-08-18: qty 5

## 2023-08-18 MED ORDER — METHOCARBAMOL 500 MG PO TABS: 500.0000 mg | ORAL_TABLET | Freq: Four times a day (QID) | ORAL | Status: DC | PRN

## 2023-08-18 MED ORDER — SODIUM CHLORIDE 0.9% FLUSH
3.0000 mL | Freq: Two times a day (BID) | INTRAVENOUS | Status: DC
  Administered 2023-08-18 – 2023-08-19 (×2): 10 mL via INTRAVENOUS

## 2023-08-18 MED ORDER — MIDAZOLAM HCL 2 MG/2ML IJ SOLN: 0.5000 mg | Freq: Once | INTRAMUSCULAR | Status: DC | PRN

## 2023-08-18 MED ORDER — ASPIRIN 81 MG PO CHEW
81.0000 mg | CHEWABLE_TABLET | Freq: Two times a day (BID) | ORAL | Status: DC
  Administered 2023-08-18 – 2023-08-19 (×2): 81 mg via ORAL
  Filled 2023-08-18 (×2): qty 1

## 2023-08-18 MED ORDER — OXYCODONE HCL 5 MG/5ML PO SOLN: 5.0000 mg | Freq: Once | ORAL | Status: DC | PRN

## 2023-08-18 MED ORDER — DEXAMETHASONE SODIUM PHOSPHATE 10 MG/ML IJ SOLN
INTRAMUSCULAR | Status: AC
  Filled 2023-08-18: qty 1

## 2023-08-18 MED ORDER — BENAZEPRIL HCL 20 MG PO TABS
10.0000 mg | ORAL_TABLET | Freq: Every day | ORAL | Status: DC
  Administered 2023-08-19: 10 mg via ORAL
  Filled 2023-08-18: qty 1

## 2023-08-18 MED ORDER — METOCLOPRAMIDE HCL 5 MG PO TABS: 5.0000 mg | ORAL_TABLET | Freq: Three times a day (TID) | ORAL | Status: DC | PRN

## 2023-08-18 MED ORDER — FENTANYL CITRATE PF 50 MCG/ML IJ SOSY
50.0000 ug | PREFILLED_SYRINGE | INTRAMUSCULAR | Status: AC
  Administered 2023-08-18: 50 ug via INTRAVENOUS
  Filled 2023-08-18: qty 2

## 2023-08-18 MED ORDER — HYDROMORPHONE HCL 1 MG/ML IJ SOLN: 0.2500 mg | INTRAMUSCULAR | Status: DC | PRN

## 2023-08-18 MED ORDER — POLYETHYLENE GLYCOL 3350 17 G PO PACK
17.0000 g | PACK | Freq: Two times a day (BID) | ORAL | Status: DC
  Administered 2023-08-18: 17 g via ORAL
  Filled 2023-08-18: qty 1

## 2023-08-18 MED ORDER — PROPOFOL 1000 MG/100ML IV EMUL
INTRAVENOUS | Status: AC
  Filled 2023-08-18: qty 100

## 2023-08-18 MED ORDER — ESCITALOPRAM OXALATE 10 MG PO TABS
10.0000 mg | ORAL_TABLET | Freq: Every day | ORAL | Status: DC
  Administered 2023-08-18 – 2023-08-19 (×2): 10 mg via ORAL
  Filled 2023-08-18 (×2): qty 1

## 2023-08-18 MED ORDER — OXYCODONE HCL 5 MG PO TABS: 5.0000 mg | ORAL_TABLET | ORAL | Status: DC | PRN

## 2023-08-18 MED ORDER — PROPOFOL 10 MG/ML IV BOLUS
INTRAVENOUS | Status: DC | PRN
  Administered 2023-08-18: 30 mg via INTRAVENOUS

## 2023-08-18 MED ORDER — DEXAMETHASONE SODIUM PHOSPHATE 10 MG/ML IJ SOLN: 8.0000 mg | Freq: Once | INTRAMUSCULAR | Status: DC

## 2023-08-18 MED ORDER — ERYTHROMYCIN 5 MG/GM OP OINT
1.0000 | TOPICAL_OINTMENT | Freq: Two times a day (BID) | OPHTHALMIC | Status: DC
  Administered 2023-08-18 – 2023-08-19 (×2): 1 via OPHTHALMIC
  Filled 2023-08-18: qty 1

## 2023-08-18 MED ORDER — MEPERIDINE HCL 50 MG/ML IJ SOLN: 6.2500 mg | INTRAMUSCULAR | Status: DC | PRN

## 2023-08-18 MED ORDER — LIDOCAINE 2% (20 MG/ML) 5 ML SYRINGE
INTRAMUSCULAR | Status: DC | PRN
  Administered 2023-08-18: 80 mg via INTRAVENOUS

## 2023-08-18 MED ORDER — METHOCARBAMOL 1000 MG/10ML IJ SOLN: 500.0000 mg | Freq: Four times a day (QID) | INTRAMUSCULAR | Status: DC | PRN

## 2023-08-18 MED ORDER — CEFAZOLIN SODIUM-DEXTROSE 2-4 GM/100ML-% IV SOLN
2.0000 g | Freq: Four times a day (QID) | INTRAVENOUS | Status: AC
  Administered 2023-08-18 – 2023-08-19 (×2): 2 g via INTRAVENOUS
  Filled 2023-08-18 (×2): qty 100

## 2023-08-18 MED ORDER — METOCLOPRAMIDE HCL 5 MG/ML IJ SOLN: 5.0000 mg | Freq: Three times a day (TID) | INTRAMUSCULAR | Status: DC | PRN

## 2023-08-18 MED ORDER — TRANEXAMIC ACID-NACL 1000-0.7 MG/100ML-% IV SOLN
1000.0000 mg | INTRAVENOUS | Status: AC
  Administered 2023-08-18: 1000 mg via INTRAVENOUS
  Filled 2023-08-18: qty 100

## 2023-08-18 MED ORDER — ORAL CARE MOUTH RINSE: 15.0000 mL | Freq: Once | OROMUCOSAL | Status: AC

## 2023-08-18 MED ORDER — DEXAMETHASONE SODIUM PHOSPHATE 10 MG/ML IJ SOLN
INTRAMUSCULAR | Status: DC | PRN
  Administered 2023-08-18: 8 mg via INTRAVENOUS

## 2023-08-18 MED ORDER — AMLODIPINE BESYLATE 5 MG PO TABS
5.0000 mg | ORAL_TABLET | Freq: Every day | ORAL | Status: DC
  Administered 2023-08-19: 5 mg via ORAL
  Filled 2023-08-18: qty 1

## 2023-08-18 MED ORDER — FLUTICASONE PROPIONATE 50 MCG/ACT NA SUSP
2.0000 | Freq: Every day | NASAL | Status: DC
  Administered 2023-08-19: 2 via NASAL
  Filled 2023-08-18 (×2): qty 16

## 2023-08-18 MED ORDER — DEXAMETHASONE SODIUM PHOSPHATE 10 MG/ML IJ SOLN
10.0000 mg | Freq: Once | INTRAMUSCULAR | Status: AC
  Administered 2023-08-19: 10 mg via INTRAVENOUS
  Filled 2023-08-18: qty 1

## 2023-08-18 MED ORDER — OXYCODONE HCL 5 MG PO TABS: 10.0000 mg | ORAL_TABLET | ORAL | Status: DC | PRN

## 2023-08-18 MED ORDER — HYDROMORPHONE HCL 1 MG/ML IJ SOLN: 0.5000 mg | INTRAMUSCULAR | Status: DC | PRN

## 2023-08-18 MED ORDER — FLUTICASONE FUROATE-VILANTEROL 100-25 MCG/ACT IN AEPB
1.0000 | INHALATION_SPRAY | Freq: Every day | RESPIRATORY_TRACT | Status: DC
Start: 2023-08-19 — End: 2023-08-19
  Administered 2023-08-19: 1 via RESPIRATORY_TRACT
  Filled 2023-08-18 (×2): qty 28

## 2023-08-18 MED ORDER — TRANEXAMIC ACID-NACL 1000-0.7 MG/100ML-% IV SOLN
1000.0000 mg | Freq: Once | INTRAVENOUS | Status: AC
  Administered 2023-08-18: 1000 mg via INTRAVENOUS
  Filled 2023-08-18: qty 100

## 2023-08-18 MED ORDER — ONDANSETRON HCL 4 MG PO TABS: 4.0000 mg | ORAL_TABLET | Freq: Four times a day (QID) | ORAL | Status: DC | PRN

## 2023-08-18 MED ORDER — ACETAMINOPHEN 500 MG PO TABS
1000.0000 mg | ORAL_TABLET | Freq: Four times a day (QID) | ORAL | Status: DC
Start: 2023-08-18 — End: 2023-08-19
  Administered 2023-08-18 – 2023-08-19 (×3): 1000 mg via ORAL
  Filled 2023-08-18 (×3): qty 2

## 2023-08-18 MED ORDER — OLOPATADINE HCL 0.1 % OP SOLN
1.0000 [drp] | Freq: Every day | OPHTHALMIC | Status: DC
  Administered 2023-08-19: 1 [drp] via OPHTHALMIC
  Filled 2023-08-18 (×2): qty 5

## 2023-08-18 MED ORDER — CHLORHEXIDINE GLUCONATE 0.12 % MT SOLN
15.0000 mL | Freq: Once | OROMUCOSAL | Status: AC
  Administered 2023-08-18: 15 mL via OROMUCOSAL

## 2023-08-18 MED ORDER — LORATADINE 10 MG PO TABS
10.0000 mg | ORAL_TABLET | Freq: Every day | ORAL | Status: DC
  Administered 2023-08-19: 10 mg via ORAL
  Filled 2023-08-18: qty 1

## 2023-08-18 MED ORDER — SENNA 8.6 MG PO TABS
2.0000 | ORAL_TABLET | Freq: Every day | ORAL | Status: DC
  Administered 2023-08-18: 17.2 mg via ORAL
  Filled 2023-08-18: qty 2

## 2023-08-18 MED ORDER — OXYCODONE HCL 5 MG PO TABS: 5.0000 mg | ORAL_TABLET | Freq: Once | ORAL | Status: DC | PRN

## 2023-08-18 MED ORDER — IPRATROPIUM-ALBUTEROL 20-100 MCG/ACT IN AERS: 1.0000 | INHALATION_SPRAY | Freq: Four times a day (QID) | RESPIRATORY_TRACT | Status: DC | PRN

## 2023-08-18 MED ORDER — ONDANSETRON HCL 4 MG/2ML IJ SOLN: 4.0000 mg | Freq: Four times a day (QID) | INTRAMUSCULAR | Status: DC | PRN

## 2023-08-18 MED ORDER — ALUM & MAG HYDROXIDE-SIMETH 200-200-20 MG/5ML PO SUSP: 30.0000 mL | ORAL | Status: DC | PRN

## 2023-08-18 MED ORDER — PROPOFOL 500 MG/50ML IV EMUL
INTRAVENOUS | Status: DC | PRN
  Administered 2023-08-18: 75 ug/kg/min via INTRAVENOUS

## 2023-08-18 MED ORDER — KETOROLAC TROMETHAMINE 30 MG/ML IJ SOLN
INTRAMUSCULAR | Status: AC
  Filled 2023-08-18: qty 1

## 2023-08-18 MED ORDER — ONDANSETRON HCL 4 MG/2ML IJ SOLN
INTRAMUSCULAR | Status: DC | PRN
  Administered 2023-08-18: 4 mg via INTRAVENOUS

## 2023-08-18 MED ORDER — MIDAZOLAM HCL 2 MG/2ML IJ SOLN
1.0000 mg | INTRAMUSCULAR | Status: DC
  Administered 2023-08-18: 1 mg via INTRAVENOUS
  Filled 2023-08-18: qty 2

## 2023-08-18 MED ORDER — BISACODYL 10 MG RE SUPP: 10.0000 mg | Freq: Every day | RECTAL | Status: DC | PRN

## 2023-08-18 MED ORDER — LACTATED RINGERS IV SOLN: INTRAVENOUS | Status: DC

## 2023-08-18 MED ORDER — ONDANSETRON HCL 4 MG/2ML IJ SOLN
INTRAMUSCULAR | Status: AC
  Filled 2023-08-18: qty 2

## 2023-08-18 MED ORDER — ACETAMINOPHEN 500 MG PO TABS
1000.0000 mg | ORAL_TABLET | Freq: Once | ORAL | Status: AC
  Administered 2023-08-18: 1000 mg via ORAL
  Filled 2023-08-18: qty 2

## 2023-08-18 MED ORDER — BUPIVACAINE IN DEXTROSE 0.75-8.25 % IT SOLN
INTRATHECAL | Status: DC | PRN
  Administered 2023-08-18: 12 mg via INTRATHECAL

## 2023-08-18 SURGICAL SUPPLY — 41 items
BAG COUNTER SPONGE SURGICOUNT (BAG) IMPLANT
BAG ZIPLOCK 12X15 (MISCELLANEOUS) ×2 IMPLANT
BNDG ELASTIC 6INX 5YD STR LF (GAUZE/BANDAGES/DRESSINGS) ×2 IMPLANT
COVER SURGICAL LIGHT HANDLE (MISCELLANEOUS) ×2 IMPLANT
CUFF TRNQT CYL 34X4.125X (TOURNIQUET CUFF) IMPLANT
DERMABOND ADVANCED .7 DNX12 (GAUZE/BANDAGES/DRESSINGS) IMPLANT
DRAPE INCISE IOBAN 66X45 STRL (DRAPES) ×6 IMPLANT
DRAPE U-SHAPE 47X51 STRL (DRAPES) ×2 IMPLANT
DRSG ADAPTIC 3X8 NADH LF (GAUZE/BANDAGES/DRESSINGS) ×2 IMPLANT
DRSG AQUACEL AG ADV 3.5X10 (GAUZE/BANDAGES/DRESSINGS) IMPLANT
DRSG AQUACEL AG ADV 3.5X14 (GAUZE/BANDAGES/DRESSINGS) IMPLANT
DRSG EMULSION OIL 3X16 NADH (GAUZE/BANDAGES/DRESSINGS) IMPLANT
DRSG MEPILEX POST OP 4X12 (GAUZE/BANDAGES/DRESSINGS) IMPLANT
DURAPREP 26ML APPLICATOR (WOUND CARE) ×2 IMPLANT
ELECT REM PT RETURN 15FT ADLT (MISCELLANEOUS) ×2 IMPLANT
EVACUATOR 1/8 PVC DRAIN (DRAIN) IMPLANT
GAUZE PAD ABD 8X10 STRL (GAUZE/BANDAGES/DRESSINGS) ×4 IMPLANT
GAUZE SPONGE 4X4 12PLY STRL (GAUZE/BANDAGES/DRESSINGS) ×2 IMPLANT
GLOVE BIO SURGEON STRL SZ 6 (GLOVE) ×2 IMPLANT
GLOVE BIO SURGEON STRL SZ8 (GLOVE) ×2 IMPLANT
GLOVE BIOGEL PI IND STRL 6.5 (GLOVE) ×2 IMPLANT
GLOVE BIOGEL PI IND STRL 7.5 (GLOVE) ×6 IMPLANT
GLOVE BIOGEL PI IND STRL 8 (GLOVE) ×2 IMPLANT
GLOVE ECLIPSE 8.0 STRL XLNG CF (GLOVE) ×2 IMPLANT
GLOVE INDICATOR 6.5 STRL GRN (GLOVE) ×4 IMPLANT
GLOVE PI ORTHO PRO STRL 7.5 (GLOVE) ×2 IMPLANT
GOWN STRL REUS W/ TWL LRG LVL3 (GOWN DISPOSABLE) ×8 IMPLANT
INSERT TIB CRS ATTUNE SZ6 12 (Insert) IMPLANT
KIT TURNOVER KIT A (KITS) IMPLANT
MANIFOLD NEPTUNE II (INSTRUMENTS) ×2 IMPLANT
PACK TOTAL KNEE CUSTOM (KITS) ×2 IMPLANT
PADDING CAST COTTON 6X4 STRL (CAST SUPPLIES) ×6 IMPLANT
PROTECTOR NERVE ULNAR (MISCELLANEOUS) ×2 IMPLANT
SET PAD KNEE POSITIONER (MISCELLANEOUS) IMPLANT
STRIP CLOSURE SKIN 1/2X4 (GAUZE/BANDAGES/DRESSINGS) ×4 IMPLANT
SUT MNCRL AB 3-0 PS2 18 (SUTURE) IMPLANT
SUT MNCRL AB 4-0 PS2 18 (SUTURE) ×2 IMPLANT
SUT STRATAFIX PDS+ 0 24IN (SUTURE) IMPLANT
SUT VIC AB 2-0 CT1 TAPERPNT 27 (SUTURE) ×4 IMPLANT
SUTURE STRATFX 0 PDS 27 VIOLET (SUTURE) ×2 IMPLANT
WRAP KNEE MAXI GEL POST OP (GAUZE/BANDAGES/DRESSINGS) IMPLANT

## 2023-08-18 NOTE — Discharge Instructions (Signed)

## 2023-08-18 NOTE — Plan of Care (Signed)

## 2023-08-18 NOTE — Interval H&P Note (Signed)
 History and Physical Interval Note:  08/18/2023 1:41 PM  [name]  has presented today for surgery, with the diagnosis of Left knee patella clunk status post revision left total knee arthroplasty.  The various methods of treatment have been discussed with the patient and family. After consideration of risks, benefits and other options for treatment, the patient has consented to  Procedure(s) with comments: ARTHROTOMY, KNEE (Left) - Left knee open scar debridment as a surgical intervention.  The patient's history has been reviewed, patient examined, no change in status, stable for surgery.  I have reviewed the patient's chart and labs.  Questions were answered to the patient's satisfaction.     [name]

## 2023-08-18 NOTE — H&P (Signed)
 ARTHROTOMY ADMISSION H&P  Patient is being admitted for left total knee arthroplasty scar excision   Therapy Plans: outpatient therapy at [facility] Disposition: Home with neighbor helping Planned DVT Prophylaxis: aspirin  81mg  BID DME needed: none PCP: Dr. Ethel Henry - clearance in her note on 4/11 TXA: IV Allergies: NKDA Anesthesia Concerns: difficult spinal last time - may do general this time BMI: 37 Last HgbA1c: Not diabetic    Other: - staying overnight - RLE wound - Oxycodone , robaxin , tylenol , celebrex  - Careful positioning of his shoulders  Subjective:  Chief Complaint:left knee pain.  HPI: [name], 73 y.o. male. He has a complex surgical history with his left knee, including primary TKA with aseptic loosening resulting in total knee revision. He did very well with this initially, but developed scar tissue with crepitus that eventually progressed to patellar clunk. He is here today for scar excision.  Patient Active Problem List   Diagnosis Date Noted   History of revision of total shoulder arthroplasty 05/09/2022   S/P revision of total knee, left 12/05/2021   Peripheral edema 09/17/2017   Eosinophilia 06/11/2016   Thrombocytopenia (HCC) 10/31/2014   Medication management 10/19/2013   Borderline anemia 10/19/2013   DJD (degenerative joint disease) 02/17/2013   Visit for preventive health examination 10/12/2011   Skin lesion of right leg 09/28/2010   Preventative health care 09/24/2010   KNEE REPLACEMENT, LEFT, HX OF 03/21/2010   OBESITY 09/17/2009   BUNDLE BRANCH BLOCK, RIGHT 09/17/2009   OSTEOARTHRITIS, KNEES, 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   SHOULDER IMPINGEMENT SYNDROME 03/14/2009   UNSPECIFIED THROMBOCYTOPENIA 12/12/2008   ANEMIA, MILD 09/22/2008   PROSTATE SPECIFIC ANTIGEN, ELEVATED 09/22/2008   KNEE PAIN, BILATERAL 07/28/2008   DEPRESSION 02/09/2007   Obstructive sleep apnea 02/09/2007   Essential hypertension 02/09/2007   Asthma, mild intermittent,  well-controlled 02/09/2007   Chronic insomnia 02/09/2007   Past Medical History:  Diagnosis Date   Allergy     Anxiety    Arthritis    knees   Asthma    Depression    Dysrhythmia    RBBB   Elevated PSA    us  and bx negative 2011   Hypertension    nl cardiolite in 1998   Pneumonia 2005   right hospitalized    Rhinosinusitis    Sleep apnea    wears CPAP    Past Surgical History:  Procedure Laterality Date   COLONOSCOPY  03/09/2013   ECTROPION REPAIR Left    2023   EYE SURGERY Left 04/08/2022   to bring lower eyelid up   FACIAL RECONSTRUCTION SURGERY     1992   HERNIA REPAIR     NASAL SINUS SURGERY     REPLACEMENT TOTAL KNEE  01/2010   left; Dr Bernard Brick; RIght knee 06-2010   REVERSE SHOULDER ARTHROPLASTY Right 03/14/2022   Procedure: REVERSE SHOULDER ARTHROPLASTY WITH REMOVAL OF ANATOMIC ARTHROPLASTY;  Surgeon: Ellard Gunning, MD;  Location[facility];  Service: Orthopedics;  Laterality: Right;   right shoulder arthroplasty     2018   shoulder atrhroscopic surgery  2011   Right   TOTAL KNEE REVISION Left 12/05/2021   Procedure: TOTAL KNEE REVISION;  Surgeon: Claiborne Crew, MD;  Location: [facility];  Service: Orthopedics;  Laterality: Left;   TOTAL SHOULDER REVISION Right 05/09/2022   Procedure: Revision Right Reverse Shoulder Arthroplasty;  Surgeon: Ellard Gunning, MD;  Location: [facility];  Service: Orthopedics;  Laterality: Right;    VENTRAL HERNIA REPAIR  03/2007   rerepair winter 2010  umbilical per pt    No current facility-administered medications for this encounter.   Current Outpatient Medications  Medication Sig Dispense Refill Last Dose/Taking   amLODipine -benazepril  (LOTREL) 5-10 MG capsule TAKE 1 CAPSULE BY MOUTH EVERY DAY 90 capsule 2 Taking   Artificial Tear Solution (SOOTHE XP) SOLN Place 1 drop into both eyes 3 (three) times daily as needed (for dryness).   Taking As Needed   Azelastine  HCl 137 MCG/SPRAY SOLN PLACE 1-2 SPRAYS INTO BOTH NOSTRILS 2 (TWO) TIMES  DAILY AS NEEDED FOR RHINITIS. 137 mL 12 Taking   clobetasol (OLUX) 0.05 % topical foam Apply 1 application  topically daily as needed (scalp itching).   Taking As Needed   Cyanocobalamin  (B-12) 1000 MCG SUBL Place 1,000 mcg under the tongue daily.   Taking   erythromycin ophthalmic ointment Place 1 Application into the left eye 2 (two) times daily.   Taking   escitalopram  (LEXAPRO ) 10 MG tablet Take 1 tablet (10 mg total) by mouth daily. 90 tablet 2 Taking   fexofenadine (ALLEGRA) 180 MG tablet Take 180 mg by mouth daily.   Taking   fluticasone  (FLONASE ) 50 MCG/ACT nasal spray SPRAY 2 SPRAYS INTO EACH NOSTRIL EVERY DAY 48 mL 2 Taking   ibuprofen  (ADVIL ) 800 MG tablet Take 1 tablet (800 mg total) by mouth every 8 (eight) hours as needed. 90 tablet 2 Taking As Needed   Ipratropium-Albuterol  (COMBIVENT  RESPIMAT) 20-100 MCG/ACT AERS respimat Inhale 1 puff up to four times daily as needed 4 g 11 Taking   Olopatadine HCl (PATADAY) 0.7 % SOLN Place 1 drop into both eyes daily.   Taking   tadalafil (CIALIS) 5 MG tablet Take 1 tablet by mouth daily.   Taking   triamcinolone  cream (KENALOG) 0.1 % Apply 1 application  topically daily as needed (itching).   Taking As Needed   WIXELA INHUB 100-50 MCG/ACT AEPB INHALE 1 PUFF BY MOUTH TWICE A DAY 60 each 6 Taking   cefadroxil (DURICEF) 500 MG capsule Take 500 mg by mouth 2 (two) times daily.      PRESCRIPTION MEDICATION CPAP- At bedtime and during any time of rest      Allergies  Allergen Reactions   Tape Other (See Comments)    Causes bleeding and redness   Egg-Derived Products Diarrhea, Nausea Only and Other (See Comments)    Terrible stomach pain    Social History   Tobacco Use   Smoking status: Never   Smokeless tobacco: Never  Substance Use Topics   Alcohol  use: No    Comment: Quit drinking ETOH 1986    Family History  Problem Relation Age of Onset   Heart failure Father        MV replacement   Myelodysplastic syndrome Father    Asthma  Mother    Polycythemia Brother    Asthma Other    Hypertension Other    Diabetes Maternal Grandmother    Healthy Sister    Healthy Brother    Healthy Brother    Colon cancer Neg Hx    Pancreatic cancer Neg Hx    Stomach cancer Neg Hx    Esophageal cancer Neg Hx    Colon polyps Neg Hx    Rectal cancer Neg Hx      Review of Systems  Constitutional:  Negative for chills and fever.  Respiratory:  Negative for cough and shortness of breath.   Cardiovascular:  Negative for chest pain.  Gastrointestinal:  Negative for nausea and vomiting.  Musculoskeletal:  Positive for arthralgias.     Objective:  Physical Exam Well nourished and well developed. General: Alert and oriented x3, cooperative and pleasant, no acute distress.  Musculoskeletal: Left knee exam: His surgical incision is healed without signs of infection, no warmth or erythema No palpable effusion Assessment of his range of motion reveals that he has full knee extension and flexes close to 110 degrees. Assessment of this range of motion does reveal significant parapatellar scarring and catching without obvious findings of patella clunk per se.  Right LE: Roughly 1 inch x 1 inch ulcerated wound on his lower anterior shin without signs of infection. Mild reactive erythema.  Vital signs in last 24 hours:    Labs:   Estimated body mass index is 37.94 kg/m as calculated from the following:   Height as of 08/11/23: 5' 2.5" (1.588 m).   Weight as of 08/11/23: 95.6 kg.   Imaging Review Plain radiographs demonstrate total knee revision components in stable positioning.      Assessment/Plan:  Patellar clunk, left knee   The patient history, physical examination, clinical judgment of the provider and imaging studies are consistent with patellar clunk of the left knee(s) and total knee arthroplasty is deemed medically necessary. The treatment options including medical management, injection therapy arthroscopy and  arthroplasty were discussed at length. The risks and benefits of scar excision were presented and reviewed. The risks due to aseptic loosening, infection, stiffness, patella tracking problems, thromboembolic complications and other imponderables were discussed. The patient acknowledged the explanation, agreed to proceed with the plan and consent was signed. Patient is being admitted for inpatient treatment for surgery, pain control, PT, OT, prophylactic antibiotics, VTE prophylaxis, progressive ambulation and ADL's and discharge planning. The patient is planning to be discharged home.    Kim Pen, PA-C Orthopedic Surgery [facility] [place] [phone]

## 2023-08-18 NOTE — Anesthesia Procedure Notes (Signed)
 Spinal  Patient location during procedure: OR End time: 08/18/2023 3:10 PM Reason for block: surgical anesthesia Staffing Performed: anesthesiologist  Anesthesiologist: Jonne Netters, MD Performed by: Jonne Netters, MD Authorized by: Jonne Netters, MD   Preanesthetic Checklist Completed: patient identified, IV checked, site marked, risks and benefits discussed, surgical consent, monitors and equipment checked, pre-op evaluation and timeout performed Spinal Block Patient position: sitting Prep: DuraPrep Patient monitoring: heart rate, cardiac monitor, continuous pulse ox and blood pressure Approach: midline Location: L3-4 Injection technique: single-shot Needle Needle type: Pencan and Introducer  Needle gauge: 24 G Needle length: 9 cm Assessment Sensory level: T4 Events: CSF return Additional Notes Pt identified in Operating room.  Monitors applied. Working IV access confirmed. Sterile prep, drape lumbar spine.  1% lido local L 3,4.  #24ga Pencan into clear CSF L 3,4.  12mg  0.75% Bupivacaine  with dextrose  injected with asp CSF beginning and end of injection.  Patient asymptomatic, VSS, no heme aspirated, tolerated well.  Fay Hoop, MD

## 2023-08-18 NOTE — Anesthesia Procedure Notes (Signed)
 Anesthesia Regional Block: Adductor canal block   Pre-Anesthetic Checklist: , timeout performed,  Correct Patient, Correct Site, Correct Laterality,  Correct Procedure, Correct Position, site marked,  Risks and benefits discussed,  Surgical consent,  Pre-op evaluation,  At surgeon's request and post-op pain management  Laterality: Left and Lower  Prep: chloraprep       Needles:  Injection technique: Single-shot  Needle Type: Echogenic Needle     Needle Length: 9cm  Needle Gauge: 21     Additional Needles:   Procedures:,,,, ultrasound used (permanent image in chart),,    Narrative:  Start time: 08/18/2023 2:38 PM End time: 08/18/2023 2:44 PM Injection made incrementally with aspirations every 5 mL.  Performed by: Personally  Anesthesiologist: Jonne Netters, MD  Additional Notes: Pt identified in Holding room.  Monitors applied. Working IV access confirmed. Timeout, Sterile prep L thigh.  #21ga ECHOgenic Arrow block needle into adductor canal with US  guidance.  20cc 0.75% Ropivacaine injected incrementally after negative test dose.  Patient asymptomatic, VSS, no heme aspirated, tolerated well.   Fay Hoop, MD

## 2023-08-18 NOTE — Transfer of Care (Signed)
 Immediate Anesthesia Transfer of Care Note  Patient: [name]  Procedure(s) Performed: ARTHROTOMY, KNEE (Left: Knee)  Patient Location: PACU  Anesthesia Type:MAC combined with regional for post-op pain  Level of Consciousness: awake and alert   Airway & Oxygen Therapy: Patient Spontanous Breathing and Patient connected to nasal cannula oxygen  Post-op Assessment: Report given to RN and Post -op Vital signs reviewed and stable  Post vital signs: Reviewed and stable  Last Vitals:  Vitals Value Taken Time  BP    Temp    Pulse 66 08/18/23 1623  Resp 20 08/18/23 1623  SpO2 98 % 08/18/23 1623  Vitals shown include unfiled device data.  Last Pain:  Vitals:   08/18/23 1445  TempSrc: Oral  PainSc: 0-No pain         Complications: No notable events documented.

## 2023-08-18 NOTE — Anesthesia Postprocedure Evaluation (Signed)
 Anesthesia Post Note  Patient: [name]  Procedure(s) Performed: ARTHROTOMY, KNEE (Left: Knee)     Patient location during evaluation: PACU Anesthesia Type: Spinal Level of consciousness: oriented, awake and alert and patient cooperative Pain management: pain level controlled Vital Signs Assessment: post-procedure vital signs reviewed and stable Respiratory status: spontaneous breathing, respiratory function stable and nonlabored ventilation Cardiovascular status: blood pressure returned to baseline and stable Postop Assessment: no headache, no backache, no apparent nausea or vomiting, patient able to bend at knees, spinal receding and adequate PO intake Anesthetic complications: no   No notable events documented.  Last Vitals:  Vitals:   08/18/23 1900 08/18/23 1915  BP: (!) 148/82 (!) 154/90  Pulse: 66 71  Resp: 17 16  Temp:  (!) 36.3 C  SpO2: 99% 100%    Last Pain:  Vitals:   08/18/23 1915  TempSrc:   PainSc: 0-No pain                 Kita Neace,E. Kelcey Wickstrom

## 2023-08-19 DIAGNOSIS — I10 Essential (primary) hypertension: Secondary | ICD-10-CM | POA: Diagnosis not present

## 2023-08-19 DIAGNOSIS — J45909 Unspecified asthma, uncomplicated: Secondary | ICD-10-CM | POA: Diagnosis not present

## 2023-08-19 DIAGNOSIS — Z79899 Other long term (current) drug therapy: Secondary | ICD-10-CM | POA: Diagnosis not present

## 2023-08-19 DIAGNOSIS — Z96611 Presence of right artificial shoulder joint: Secondary | ICD-10-CM | POA: Diagnosis not present

## 2023-08-19 DIAGNOSIS — Z96653 Presence of artificial knee joint, bilateral: Secondary | ICD-10-CM | POA: Diagnosis not present

## 2023-08-19 DIAGNOSIS — M24662 Ankylosis, left knee: Secondary | ICD-10-CM | POA: Diagnosis not present

## 2023-08-19 LAB — BASIC METABOLIC PANEL WITH GFR
Anion gap: 9 (ref 5–15)
BUN: 12 mg/dL (ref 8–23)
CO2: 23 mmol/L (ref 22–32)
Calcium: 9.2 mg/dL (ref 8.9–10.3)
Chloride: 98 mmol/L (ref 98–111)
Creatinine, Ser: 0.54 mg/dL — ABNORMAL LOW (ref 0.61–1.24)
GFR, Estimated: >60 mL/min (ref >60)
Glucose, Bld: 185 mg/dL — ABNORMAL HIGH (ref 70–99)
Potassium: 4.3 mmol/L (ref 3.5–5.1)
Sodium: 130 mmol/L — ABNORMAL LOW (ref 135–145)

## 2023-08-19 LAB — CBC
HCT: 39.4 % (ref 39.0–52.0)
Hemoglobin: 13.5 g/dL (ref 13.0–17.0)
MCH: 36 pg — ABNORMAL HIGH (ref 26.0–34.0)
MCHC: 34.3 g/dL (ref 30.0–36.0)
MCV: 105.1 fL — ABNORMAL HIGH (ref 80.0–100.0)
Platelets: 146 10*3/uL — ABNORMAL LOW (ref 150–400)
RBC: 3.75 MIL/uL — ABNORMAL LOW (ref 4.22–5.81)
RDW: 13.3 % (ref 11.5–15.5)
WBC: 7.3 10*3/uL (ref 4.0–10.5)
nRBC: 0 % (ref 0.0–0.2)

## 2023-08-19 MED ORDER — ASPIRIN 81 MG PO CHEW: 81.0000 mg | CHEWABLE_TABLET | Freq: Two times a day (BID) | ORAL | 0 refills | Status: AC

## 2023-08-19 MED ORDER — METHOCARBAMOL 500 MG PO TABS: 500.0000 mg | ORAL_TABLET | Freq: Four times a day (QID) | ORAL | 2 refills | Status: DC | PRN

## 2023-08-19 MED ORDER — SENNA 8.6 MG PO TABS: 2.0000 | ORAL_TABLET | Freq: Every day | ORAL | 0 refills | Status: AC

## 2023-08-19 MED ORDER — POLYETHYLENE GLYCOL 3350 17 G PO PACK: 17.0000 g | PACK | Freq: Two times a day (BID) | ORAL | 0 refills | Status: DC

## 2023-08-19 MED ORDER — OXYCODONE HCL 5 MG PO TABS: 5.0000 mg | ORAL_TABLET | ORAL | 0 refills | Status: DC | PRN

## 2023-08-19 NOTE — Progress Notes (Signed)
 IV removed, dressed, belongings packed, instructions reviewed with understanding verbalized, transferred to front entrance via wheelchair.

## 2023-08-19 NOTE — Plan of Care (Signed)
  Problem: Education: Goal: Knowledge of General Education information will improve Description: Including pain rating scale, medication(s)/side effects and non-pharmacologic comfort measures 08/19/2023 0823 by Kerwin Peels, RN Outcome: Adequate for Discharge 08/19/2023 0749 by Kerwin Peels, RN Outcome: Progressing   Problem: Health Behavior/Discharge Planning: Goal: Ability to manage health-related needs will improve 08/19/2023 0823 by Kerwin Peels, RN Outcome: Adequate for Discharge 08/19/2023 0749 by Kerwin Peels, RN Outcome: Progressing   Problem: Clinical Measurements: Goal: Ability to maintain clinical measurements within normal limits will improve 08/19/2023 0823 by Kerwin Peels, RN Outcome: Adequate for Discharge 08/19/2023 0749 by Kerwin Peels, RN Outcome: Progressing Goal: Will remain free from infection 08/19/2023 0823 by Kerwin Peels, RN Outcome: Adequate for Discharge 08/19/2023 0749 by Kerwin Peels, RN Outcome: Progressing Goal: Diagnostic test results will improve 08/19/2023 0823 by Kerwin Peels, RN Outcome: Adequate for Discharge 08/19/2023 0749 by Kerwin Peels, RN Outcome: Progressing Goal: Respiratory complications will improve 08/19/2023 0823 by Kerwin Peels, RN Outcome: Adequate for Discharge 08/19/2023 0749 by Kerwin Peels, RN Outcome: Progressing Goal: Cardiovascular complication will be avoided 08/19/2023 1610 by Kerwin Peels, RN Outcome: Adequate for Discharge 08/19/2023 0749 by Kerwin Peels, RN Outcome: Progressing   Problem: Activity: Goal: Risk for activity intolerance will decrease 08/19/2023 0823 by Kerwin Peels, RN Outcome: Adequate for Discharge 08/19/2023 [phone] by Kerwin Peels, RN Outcome: Progressing   Problem: Nutrition: Goal: Adequate nutrition will be maintained Outcome: Adequate for Discharge   Problem:  Coping: Goal: Level of anxiety will decrease Outcome: Adequate for Discharge   Problem: Elimination: Goal: Will not experience complications related to bowel motility Outcome: Adequate for Discharge Goal: Will not experience complications related to urinary retention Outcome: Adequate for Discharge   Problem: Pain Managment: Goal: General experience of comfort will improve and/or be controlled Outcome: Adequate for Discharge   Problem: Safety: Goal: Ability to remain free from injury will improve Outcome: Adequate for Discharge   Problem: Skin Integrity: Goal: Risk for impaired skin integrity will decrease Outcome: Adequate for Discharge   Problem: Education: Goal: Knowledge of the prescribed therapeutic regimen will improve Outcome: Adequate for Discharge Goal: Individualized Educational Video(s) Outcome: Adequate for Discharge   Problem: Activity: Goal: Ability to avoid complications of mobility impairment will improve Outcome: Adequate for Discharge Goal: Range of joint motion will improve Outcome: Adequate for Discharge   Problem: Clinical Measurements: Goal: Postoperative complications will be avoided or minimized Outcome: Adequate for Discharge   Problem: Pain Management: Goal: Pain level will decrease with appropriate interventions Outcome: Adequate for Discharge   Problem: Skin Integrity: Goal: Will show signs of wound healing Outcome: Adequate for Discharge

## 2023-08-19 NOTE — Care Management Obs Status (Signed)
 MEDICARE OBSERVATION STATUS NOTIFICATION   Patient Details  Name: QUANTAE MARTEL MRN: [id number] Date of Birth: 07-19-1950   Medicare Observation Status Notification Given:  Yes    Bari Leys, RN 08/19/2023, 9:42 AM

## 2023-08-19 NOTE — Plan of Care (Signed)

## 2023-08-19 NOTE — Care Management Obs Status (Incomplete)
 c2MEDICARE OBSERVATION STATUS NOTIFICATION   Patient Details  Name: [name] MRN: [id number] Date of Birth: 1950/11/19   Medicare Observation Status Notification Given:       Bari Leys, RN 08/19/2023, 9:41 AM

## 2023-08-19 NOTE — Brief Op Note (Signed)
 08/18/2023  6:47 AM  PATIENT:  [name]  73 y.o. 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  PROCEDURE:  Procedure(s) with comments: ARTHROTOMY, KNEE (Left) - Left knee open scar debridment With polyethylene revision  SURGEON:  Surgeons and Role:    Claiborne Crew, MD - Primary  PHYSICIAN ASSISTANT: Kim Pen, PA-C  ANESTHESIA:   regional and spinal  EBL:  20 mL   BLOOD ADMINISTERED:none  DRAINS: none   LOCAL MEDICATIONS USED:  NONE  SPECIMEN:  No Specimen  DISPOSITION OF SPECIMEN:  N/A  COUNTS:  YES  TOURNIQUET:   Total Tourniquet Time Documented: Thigh (Left) - 15 minutes Total: Thigh (Left) - 15 minutes   DICTATION: .Other Dictation: Dictation Number [id number]  PLAN OF CARE: Admit for overnight observation  PATIENT DISPOSITION:  PACU - hemodynamically stable.   Delay start of Pharmacological VTE agent (>24hrs) due to surgical blood loss or risk of bleeding: no

## 2023-08-19 NOTE — TOC Transition Note (Signed)
 Transition of Care [facility]) - Discharge Note   Patient Details  Name: [name] MRN: [id number] Date of Birth: 02-Jan-1951  [facility]) CM/SW Contact:  Bari Leys, RN Phone Number: 08/19/2023, 10:42 AM   Clinical Narrative:   Met with patient at bedside to review dc therapy and home equipment needs , patient confirmed OPPT at EO, has RW, no home DME needs. No TOC needs.     Final next level of care: OP Rehab     Patient Goals and CMS Choice Patient states their goals for this hospitalization and ongoing recovery are:: return home          Discharge Placement                       Discharge Plan and Services Additional resources added to the After Visit Summary for                                       Social Drivers of Health (SDOH) Interventions SDOH Screenings   Food Insecurity: No Food Insecurity (08/18/2023)  Housing: Low Risk  (08/18/2023)  Transportation Needs: No Transportation Needs (08/18/2023)  Utilities: Not At Risk (08/18/2023)  Alcohol  Screen: Low Risk  (07/06/2023)  Depression (PHQ2-9): Low Risk  (07/06/2023)  Financial Resource Strain: Low Risk  (07/06/2023)  Physical Activity: Insufficiently Active (07/06/2023)  Social Connections: Moderately Integrated (08/18/2023)  Recent Concern: Social Connections - Moderately Isolated (06/16/2023)  Stress: No Stress Concern Present (07/06/2023)  Recent Concern: Stress - Stress Concern Present (06/16/2023)  Tobacco Use: Low Risk  (08/18/2023)  Health Literacy: Adequate Health Literacy (07/06/2023)     Readmission Risk Interventions     No data to display

## 2023-08-19 NOTE — Op Note (Signed)
 NAME: [name], [name] MEDICAL RECORD NO: [id number] ACCOUNT NO: [id number] DATE OF BIRTH: 1950/04/12 FACILITY: [facility] LOCATION: [id number] PHYSICIAN: Azalea Lento. 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  PROCEDURE: 1.  Open excisional debridement, left knee. 2.  Polyethylene revision utilizing an Attune Revision polyethylene insert to match a size 6 femur x 12.5 varus, valgus enhanced stability.  SURGEON: Azalea Lento. Bernard Brick, MD  ASSISTANT: Kim Pen, PA-C.  Note that, PA Cleotilde Dago was present for the entirety of the case in preoperative positioning, perioperative management of the operative extremity, general facilitation of the case and primary wound closure.  ANESTHESIA:  Regional plus spinal.  BLOOD LOSS:  Less than 50 mL.  TOURNIQUET:  Up for 15 minutes at 225 mmHg.  DRAINS:  None.  COMPLICATIONS:  None.  INDICATIONS FOR THE PROCEDURE:  The patient is a very pleasant 73 year old male with a history of revised left total knee arthroplasty.  This was performed in 2023.  He presented recently with development of painful mechanical catching with exam findings  consistent with patellar clunk.  Based on his pain level, we discussed surgical debridement.  I discussed that I prefer performing this in an open fashion to better evaluate the stability of the knee intraoperatively as well as visually perform  debridement down to the tendon around the patella to make certain that everything was as stable as possible.  Risks of recurrent parapatellar scarring, infection, DVT, and need for future surgeries were reviewed.  The postoperative course and  postoperative physical therapy needs  reviewed.  DESCRIPTION OF PROCEDURE:  The patient was brought to the operative theater.  Once adequate anesthesia, preoperative antibiotics, Ancef  administered as well as tranexamic acid  and Decadron , he was positioned supine with a left thigh tourniquet placed.   The left lower extremity was then prepped and draped in a sterile fashion.  A timeout was performed, identifying the patient, planned procedure and extremity.  The leg was exsanguinated and the tourniquet elevated to 225 mmHg.  His old incision was  utilized.  Soft tissue exposure was carried out.  The excisional debridement of this case was carried out with a median arthrotomy.  We encountered clear synovial fluid.  At this point, I used the Bovie cautery to perform extensive scar debridement  including the medial gutter, lateral gutter, suprapatellar pouch as well as specifically in the parapatellar region, identifying significant parapatellar scarring and even photographing this for him.  This debridement was carried out only for a  synovectomy and scar debridement and involved an 8 inch incision and the depth of the wound was deep into the wound.  There was no debridement of muscle, tendon, or bone.  During the evaluation, I also felt that I was going to revise his polyethylene to  provide more stability medially and laterally to help perhaps prevent scar formation.  I removed his old polyethylene insert, which was a size 10.  I increased this to a 12.5 enhanced revision polyethylene insert to match the size 6 femur.  This new poly  was placed.  The knee was irrigated with normal saline solution.  The tourniquet was let down after 15 minutes.  We then reapproximated the extensor mechanism using #1 Vicryl and #1 Stratafix suture.  The remainder of the wound  was closed with 2-0  Vicryl and running Monocryl stitch.  The wound was clean, dry, and dressed sterilely using surgical glue and Aquacel dressing.  The patient was brought to the recovery  room in stable condition, tolerating the procedure well.  Findings were reviewed with  family.   SHW D: 08/19/2023 6:55:02 am T: 08/19/2023 7:41:00 am  JOB: [id number]/ [id number]

## 2023-08-19 NOTE — Evaluation (Signed)
 Physical Therapy Evaluation Patient Details Name: [name] MRN: [id number] DOB: May 25, 1950 Today's Date: 08/19/2023  History of Present Illness  73 y.o. male has a complex surgical history with his left knee, including primary L TKA with aseptic loosening resulting in total knee revision. He did very well with this initially, but developed scar tissue with crepitus that eventually progressed to patellar clunk.  pt is now s/p open excision deb and poly revision L knee on 08/18/23. [name]:WJXBJ, bil TKAs, ventral hernia repair, sleep apnea  Clinical Impression  Patient evaluated by Physical Therapy with no further acute PT needs identified. All education has been completed and the patient has no further questions.  Reviewed mobility as below; pt declines stair practice as he is familiar with various techniques from prior surgeries.  Reviewed knee AROM, knee flexion/extension exercises, AROM -7 to 110 degrees flexion.  Pt is motivated to return to active lifestyle, scuba diving etc. Ready to d/c from PT standpoint.  See below for any follow-up Physical Therapy or equipment needs. PT is signing off. Thank you for this referral.         If plan is discharge home, recommend the following:     Can travel by private vehicle        Equipment Recommendations None recommended by PT  Recommendations for Other Services       Functional Status Assessment       Precautions / Restrictions Precautions Precautions: Fall;Knee Restrictions Weight Bearing Restrictions Per Provider Order: No Other Position/Activity Restrictions: WBAT      Mobility  Bed Mobility Overal bed mobility: Needs Assistance Bed Mobility: Supine to Sit     Supine to sit: Modified independent (Device/Increase time)          Transfers Overall transfer level: Needs assistance Equipment used: Rolling walker (2 wheels) Transfers: Sit to/from Stand Sit to Stand: Supervision           General transfer comment:  for safety, LOB on initial stand; steady on subsequent stand trials; cues/reinforcement L knee flexion during STS    Ambulation/Gait Ambulation/Gait assistance: Supervision Gait Distance (Feet): 100 Feet Assistive device: Rolling walker (2 wheels) Gait Pattern/deviations: Step-through pattern       General Gait Details: supervision for safety, steady with RW, no LOB  Stairs            Wheelchair Mobility     Tilt Bed    Modified Rankin (Stroke Patients Only)       Balance Overall balance assessment: Mild deficits observed, not formally tested                                           Pertinent Vitals/Pain Pain Assessment Pain Assessment: 0-10 Pain Score: 4  Pain Location: L knee Pain Descriptors / Indicators: Sore Pain Intervention(s): Limited activity within patient's tolerance, Monitored during session, Repositioned    Home Living Family/patient expects to be discharged to:: Private residence Living Arrangements: Alone Available Help at Discharge: Family;Available 24 hours/day Type of Home: House Home Access: Stairs to enter   [employer] of Steps: 2-3   Home Layout: One level Home Equipment: Agricultural consultant (2 wheels)      Prior Function Prior Level of Function : Independent/Modified Independent             Mobility Comments: very active, enjoys cycling, scuba diving ADLs Comments: ind  Extremity/Trunk Assessment   Upper Extremity Assessment Upper Extremity Assessment: Overall WFL for tasks assessed    Lower Extremity Assessment Lower Extremity Assessment: LLE deficits/detail LLE Deficits / Details: strength grossly 3+ to 4/5; knee AROM -7 to 110 degrees flexion       Communication   Communication Communication: No apparent difficulties    Cognition Arousal: Alert Behavior During Therapy: WFL for tasks assessed/performed   PT - Cognitive impairments: No apparent impairments                          Following commands: Intact       Cueing Cueing Techniques: Verbal cues     General Comments      Exercises Total Joint Exercises Quad Sets: AROM, Left, 5 reps Heel Slides: AROM, AAROM, Left, 5 reps Knee Flexion: AROM, AAROM, Left, 5 reps   Assessment/Plan    PT Assessment All further PT needs can be met in the next venue of care  PT Problem List         PT Treatment Interventions      PT Goals (Current goals can be found in the Care Plan section)  Acute Rehab PT Goals PT Goal Formulation: All assessment and education complete, DC therapy    Frequency       Co-evaluation               AM-PAC PT "6 Clicks" Mobility  Outcome Measure Help needed turning from your back to your side while in a flat bed without using bedrails?: None Help needed moving from lying on your back to sitting on the side of a flat bed without using bedrails?: None Help needed moving to and from a bed to a chair (including a wheelchair)?: A Little Help needed standing up from a chair using your arms (e.g., wheelchair or bedside chair)?: A Little Help needed to walk in hospital room?: A Little Help needed climbing 3-5 steps with a railing? : A Little 6 Click Score: 20    End of Session Equipment Utilized During Treatment: Gait belt Activity Tolerance: Patient tolerated treatment well Patient left: in chair;with call bell/phone within reach;with chair alarm set Nurse Communication: Mobility status PT Visit Diagnosis: Other abnormalities of gait and mobility (R26.89)    Time: 1010-1045 PT Time Calculation (min) (ACUTE ONLY): 35 min   Charges:   PT Evaluation $PT Eval Low Complexity: 1 Low PT Treatments $Gait Training: 8-22 mins PT General Charges $$ ACUTE PT VISIT: 1 Visit         Melony Tenpas, PT  Acute Rehab Dept [facility]) [phone]  08/19/2023   [facility] 08/19/2023, 11:21 AM

## 2023-08-19 NOTE — Progress Notes (Signed)
   Subjective: 1 Day Post-Op Procedure(s) (LRB): ARTHROTOMY, KNEE (Left) Patient reports pain as mild.   Patient seen in rounds with Dr. Bernard Brick. Patient is well, and has had no acute complaints or problems. No acute events overnight. Foley catheter removed. Patient has not been up with PT yet.  We will start therapy today.   Objective: Vital signs in last 24 hours: Temp:  [97.4 F (36.3 C)-98 F (36.7 C)] 97.9 F (36.6 C) (05/21 0557) Pulse Rate:  [53-76] 68 (05/21 0557) Resp:  [10-21] 17 (05/21 0557) BP: (114-154)/(65-103) 142/77 (05/21 0557) SpO2:  [93 %-100 %] 94 % (05/21 0557) Weight:  [95.6 kg] 95.6 kg (05/20 1319)  Intake/Output from previous day:  Intake/Output Summary (Last 24 hours) at 08/19/2023 0806 Last data filed at 08/19/2023 0645 Gross per 24 hour  Intake 1584.89 ml  Output 3270 ml  Net -1685.11 ml     Intake/Output this shift: No intake/output data recorded.  Labs: Recent Labs    08/19/23 0353  HGB 13.5   Recent Labs    08/19/23 0353  WBC 7.3  RBC 3.75*  HCT 39.4  PLT 146*   Recent Labs    08/19/23 0353  NA 130*  K 4.3  CL 98  CO2 23  BUN 12  CREATININE 0.54*  GLUCOSE 185*  CALCIUM 9.2   No results for input(s): "LABPT", "INR" in the last 72 hours.  Exam: General - Patient is Alert and Oriented Extremity - Neurologically intact Sensation intact distally Intact pulses distally Dorsiflexion/Plantar flexion intact Dressing - dressing C/D/I Motor Function - intact, moving foot and toes well on exam.   Past Medical History:  Diagnosis Date   Allergy     Anxiety    Arthritis    knees   Asthma    Depression    Dysrhythmia    RBBB   Elevated PSA    us  and bx negative 2011   Hypertension    nl cardiolite in 1998   Pneumonia 2005   right hospitalized    Rhinosinusitis    Sleep apnea    wears CPAP    Assessment/Plan: 1 Day Post-Op Procedure(s) (LRB): ARTHROTOMY, KNEE (Left) Principal Problem:   Arthrofibrosis of knee  joint, left  Estimated body mass index is 37.94 kg/m as calculated from the following:   Height as of this encounter: 5' 2.5" (1.588 m).   Weight as of this encounter: 95.6 kg. Advance diet Up with therapy     DVT Prophylaxis - Aspirin  Weight bearing as tolerated.  Hgb stable at 13.5 this AM.  Up with PT - should d/c after one session likely Patient is extremely motivated and very active at baseline (biking, scuba diving, etc)  We will send home with mepilex dressings as he has very fragile skin - change as soiled   Kim Pen, PA-C Orthopedic Surgery [phone] 08/19/2023, 8:06 AM

## 2023-08-21 ENCOUNTER — Encounter (HOSPITAL_COMMUNITY): Payer: Self-pay | Admitting: Orthopedic Surgery

## 2023-08-23 NOTE — Discharge Summary (Signed)
 Patient ID: [name] MRN: [id number] DOB/AGE: 1950/10/23 73 y.o.  Admit date: 08/18/2023 Discharge date: 08/19/2023  Admission Diagnoses:  Arthrofibrosis, left knee  Discharge Diagnoses:  Principal Problem:   Arthrofibrosis of knee joint, left   Past Medical History:  Diagnosis Date   Allergy     Anxiety    Arthritis    knees   Asthma    Depression    Dysrhythmia    RBBB   Elevated PSA    us  and bx negative 2011   Hypertension    nl cardiolite in 1998   Pneumonia 2005   right hospitalized    Rhinosinusitis    Sleep apnea    wears CPAP    Surgeries: Procedure(s): ARTHROTOMY, KNEE on 08/18/2023   Consultants:   Discharged Condition: Improved  Hospital Course: RISHI VICARIO is an 73 y.o. male who was admitted 08/18/2023 for operative treatment ofArthrofibrosis of knee joint, left. Patient has severe unremitting pain that affects sleep, daily activities, and work/hobbies. After pre-op clearance the patient was taken to the operating room on 08/18/2023 and underwent  Procedure(s): ARTHROTOMY, KNEE.    Patient was given perioperative antibiotics:  Anti-infectives (From admission, onward)    Start     Dose/Rate Route Frequency Ordered Stop   08/18/23 2100  ceFAZolin  (ANCEF ) IVPB 2g/100 mL premix        2 g 200 mL/hr over 30 Minutes Intravenous Every 6 hours 08/18/23 1953 08/19/23 0748   08/18/23 1330  ceFAZolin  (ANCEF ) IVPB 2g/100 mL premix        2 g 200 mL/hr over 30 Minutes Intravenous On call to O.R. 08/18/23 1315 08/18/23 1511        Patient was given sequential compression devices, early ambulation, and chemoprophylaxis to prevent DVT. Patient worked with PT and was meeting their goals regarding safe ambulation and transfers.  Patient benefited maximally from hospital stay and there were no complications.    Recent vital signs: No data found.   Recent laboratory studies: No results for input(s): "WBC", "HGB", "HCT", "PLT", "NA", "K", "CL", "CO2",  "BUN", "CREATININE", "GLUCOSE", "INR", "CALCIUM" in the last 72 hours.  Invalid input(s): "PT", "2"   Discharge Medications:   Allergies as of 08/19/2023       Reactions   Tape Other (See Comments)   Causes bleeding and redness   Egg-derived Products Diarrhea, Nausea Only, Other (See Comments)   Terrible stomach pain        Medication List     STOP taking these medications    ibuprofen  800 MG tablet Commonly known as: ADVIL        TAKE these medications    amLODipine -benazepril  5-10 MG capsule Commonly known as: LOTREL TAKE 1 CAPSULE BY MOUTH EVERY DAY   aspirin  81 MG chewable tablet Chew 1 tablet (81 mg total) by mouth 2 (two) times daily for 28 days.   Azelastine  HCl 137 MCG/SPRAY Soln PLACE 1-2 SPRAYS INTO BOTH NOSTRILS 2 (TWO) TIMES DAILY AS NEEDED FOR RHINITIS.   B-12 1000 MCG Subl Place 1,000 mcg under the tongue daily.   cefadroxil 500 MG capsule Commonly known as: DURICEF Take 500 mg by mouth 2 (two) times daily.   clobetasol 0.05 % topical foam Commonly known as: OLUX Apply 1 application  topically daily as needed (scalp itching).   Combivent  Respimat 20-100 MCG/ACT Aers respimat Generic drug: Ipratropium-Albuterol  Inhale 1 puff up to four times daily as needed   erythromycin ophthalmic ointment Place 1 Application into the left eye  2 (two) times daily.   escitalopram  10 MG tablet Commonly known as: LEXAPRO  Take 1 tablet (10 mg total) by mouth daily.   fexofenadine 180 MG tablet Commonly known as: ALLEGRA Take 180 mg by mouth daily.   fluticasone  50 MCG/ACT nasal spray Commonly known as: FLONASE  SPRAY 2 SPRAYS INTO EACH NOSTRIL EVERY DAY   methocarbamol  500 MG tablet Commonly known as: ROBAXIN  Take 1 tablet (500 mg total) by mouth every 6 (six) hours as needed for muscle spasms.   oxyCODONE  5 MG immediate release tablet Commonly known as: Oxy IR/ROXICODONE  Take 1 tablet (5 mg total) by mouth every 4 (four) hours as needed for severe  pain (pain score 7-10).   Pataday 0.7 % Soln Generic drug: Olopatadine HCl Place 1 drop into both eyes daily.   polyethylene glycol 17 g packet Commonly known as: MIRALAX  / GLYCOLAX  Take 17 g by mouth 2 (two) times daily.   PRESCRIPTION MEDICATION CPAP- At bedtime and during any time of rest   senna 8.6 MG Tabs tablet Commonly known as: SENOKOT Take 2 tablets (17.2 mg total) by mouth at bedtime for 14 days.   Soothe XP Soln Place 1 drop into both eyes 3 (three) times daily as needed (for dryness).   tadalafil 5 MG tablet Commonly known as: CIALIS Take 1 tablet by mouth daily.   triamcinolone  cream 0.1 % Commonly known as: KENALOG Apply 1 application  topically daily as needed (itching).   Wixela Inhub 100-50 MCG/ACT Aepb Generic drug: fluticasone -salmeterol INHALE 1 PUFF BY MOUTH TWICE A DAY               Discharge Care Instructions  (From admission, onward)           Start     Ordered   08/19/23 0000  Change dressing       Comments: Maintain surgical dressing until follow up in the clinic. If the edges start to pull up, may reinforce with tape. If the dressing is no longer working, may remove and cover with gauze and tape, but must keep the area dry and clean.  Call with any questions or concerns.   08/19/23 0811            Diagnostic Studies: No results found.  Disposition: Discharge disposition: 01-Home or Self Care       Discharge Instructions     Call MD / Call 911   Complete by: As directed    If you experience chest pain or shortness of breath, CALL 911 and be transported to the hospital emergency room.  If you develope a fever above 101 F, pus (white drainage) or increased drainage or redness at the wound, or calf pain, call your surgeon's office.   Change dressing   Complete by: As directed    Maintain surgical dressing until follow up in the clinic. If the edges start to pull up, may reinforce with tape. If the dressing is no longer  working, may remove and cover with gauze and tape, but must keep the area dry and clean.  Call with any questions or concerns.   Constipation Prevention   Complete by: As directed    Drink plenty of fluids.  Prune juice may be helpful.  You may use a stool softener, such as Colace (over the counter) 100 mg twice a day.  Use MiraLax  (over the counter) for constipation as needed.   Diet - low sodium heart healthy   Complete by: As directed  Increase activity slowly as tolerated   Complete by: As directed    Weight bearing as tolerated with assist device (walker, cane, etc) as directed, use it as long as suggested by your surgeon or therapist, typically at least 4-6 weeks.   Post-operative opioid taper instructions:   Complete by: As directed    POST-OPERATIVE OPIOID TAPER INSTRUCTIONS: It is important to wean off of your opioid medication as soon as possible. If you do not need pain medication after your surgery it is ok to stop day one. Opioids include: Codeine, Hydrocodone(Norco, Vicodin), Oxycodone (Percocet, oxycontin ) and hydromorphone  amongst others.  Long term and even short term use of opiods can cause: Increased pain response Dependence Constipation Depression Respiratory depression And more.  Withdrawal symptoms can include Flu like symptoms Nausea, vomiting And more Techniques to manage these symptoms Hydrate well Eat regular healthy meals Stay active Use relaxation techniques(deep breathing, meditating, yoga) Do Not substitute Alcohol  to help with tapering If you have been on opioids for less than two weeks and do not have pain than it is ok to stop all together.  Plan to wean off of opioids This plan should start within one week post op of your joint replacement. Maintain the same interval or time between taking each dose and first decrease the dose.  Cut the total daily intake of opioids by one tablet each day Next start to increase the time between doses. The last  dose that should be eliminated is the evening dose.      TED hose   Complete by: As directed    Use stockings (TED hose) for 2 weeks on both leg(s).  You may remove them at night for sleeping.        Follow-up Information     Claiborne Crew, MD. Schedule an appointment as soon as possible for a visit in 2 week(s).   Specialty: Orthopedic Surgery Contact information: [address] [place] [address] [phone]                  Signed: Earnie Gola 08/23/2023, 11:10 AM

## 2023-08-25 DIAGNOSIS — F4323 Adjustment disorder with mixed anxiety and depressed mood: Secondary | ICD-10-CM | POA: Diagnosis not present

## 2023-08-27 ENCOUNTER — Encounter: Payer: Self-pay | Admitting: Internal Medicine

## 2023-08-27 DIAGNOSIS — Z96652 Presence of left artificial knee joint: Secondary | ICD-10-CM | POA: Diagnosis not present

## 2023-08-27 DIAGNOSIS — M25562 Pain in left knee: Secondary | ICD-10-CM | POA: Diagnosis not present

## 2023-08-27 DIAGNOSIS — G4733 Obstructive sleep apnea (adult) (pediatric): Secondary | ICD-10-CM

## 2023-08-27 NOTE — Telephone Encounter (Signed)
 Order- DME [employer]- please replace old CPAP "motor life exceeded", autopap 10-20, mask of choice, heated humidifier, supplies, AirView/card.

## 2023-08-31 DIAGNOSIS — Z96652 Presence of left artificial knee joint: Secondary | ICD-10-CM | POA: Diagnosis not present

## 2023-08-31 DIAGNOSIS — M25562 Pain in left knee: Secondary | ICD-10-CM | POA: Diagnosis not present

## 2023-09-04 DIAGNOSIS — Z96652 Presence of left artificial knee joint: Secondary | ICD-10-CM | POA: Diagnosis not present

## 2023-09-04 DIAGNOSIS — M25562 Pain in left knee: Secondary | ICD-10-CM | POA: Diagnosis not present

## 2023-09-08 DIAGNOSIS — M25562 Pain in left knee: Secondary | ICD-10-CM | POA: Diagnosis not present

## 2023-09-08 DIAGNOSIS — Z96652 Presence of left artificial knee joint: Secondary | ICD-10-CM | POA: Diagnosis not present

## 2023-09-08 DIAGNOSIS — F4323 Adjustment disorder with mixed anxiety and depressed mood: Secondary | ICD-10-CM | POA: Diagnosis not present

## 2023-09-11 DIAGNOSIS — Z96652 Presence of left artificial knee joint: Secondary | ICD-10-CM | POA: Diagnosis not present

## 2023-09-11 DIAGNOSIS — M25562 Pain in left knee: Secondary | ICD-10-CM | POA: Diagnosis not present

## 2023-09-15 ENCOUNTER — Other Ambulatory Visit: Payer: Self-pay | Admitting: Internal Medicine

## 2023-09-15 DIAGNOSIS — M25562 Pain in left knee: Secondary | ICD-10-CM | POA: Diagnosis not present

## 2023-09-15 DIAGNOSIS — Z96652 Presence of left artificial knee joint: Secondary | ICD-10-CM | POA: Diagnosis not present

## 2023-09-15 NOTE — Telephone Encounter (Signed)
 Spoke to pt. Pt states he is not taking Ibuprofen  but is taking celecoxib . He states he does not need a refill at this moment but would like to take when he ran out of celecoxib . Advise pt to reach out to us  when he needs a refill. Pt verbalized understanding.

## 2023-09-22 DIAGNOSIS — G4733 Obstructive sleep apnea (adult) (pediatric): Secondary | ICD-10-CM | POA: Diagnosis not present

## 2023-09-23 DIAGNOSIS — F4323 Adjustment disorder with mixed anxiety and depressed mood: Secondary | ICD-10-CM | POA: Diagnosis not present

## 2023-09-23 DIAGNOSIS — Z96652 Presence of left artificial knee joint: Secondary | ICD-10-CM | POA: Diagnosis not present

## 2023-09-23 DIAGNOSIS — M25562 Pain in left knee: Secondary | ICD-10-CM | POA: Diagnosis not present

## 2023-09-25 DIAGNOSIS — G4733 Obstructive sleep apnea (adult) (pediatric): Secondary | ICD-10-CM | POA: Diagnosis not present

## 2023-10-05 ENCOUNTER — Ambulatory Visit: Payer: Self-pay

## 2023-10-05 DIAGNOSIS — R42 Dizziness and giddiness: Secondary | ICD-10-CM | POA: Diagnosis not present

## 2023-10-05 DIAGNOSIS — L03115 Cellulitis of right lower limb: Secondary | ICD-10-CM | POA: Diagnosis not present

## 2023-10-05 NOTE — Telephone Encounter (Signed)
 FYI Only or Action Required?: FYI only for provider.  Patient was last seen in primary care on 08/11/2023 by Panosh, Apolinar POUR, MD. Called Nurse Triage reporting Dizziness. Symptoms began yesterday. Interventions attempted: Rest, hydration, or home remedies. Symptoms are: unchanged.  Triage Disposition: See Physician Within 24 Hours  Patient/caregiver understands and will follow disposition?: Yes   Copied from CRM [id number]. Topic: Clinical - Red Word Triage >> Oct 05, 2023 12:40 PM Suzen RAMAN wrote: Red Word that prompted transfer to Nurse Triage: Lightenheadness Reason for Disposition  [1] MODERATE dizziness (e.g., interferes with normal activities) AND [2] has NOT been evaluated by doctor (or NP/PA) for this  (Exception: Dizziness caused by heat exposure, sudden standing, or poor fluid intake.)  Answer Assessment - Initial Assessment Questions 1. DESCRIPTION: Describe your dizziness.     Lightheadedness - reports has eaten and is well hydrated Endorses riding bike on Friday for 10+ miles and again on Saturday - reports working on endurance 2. LIGHTHEADED: Do you feel lightheaded? (e.g., somewhat faint, woozy, weak upon standing)     Woozy 3. VERTIGO: Do you feel like either you or the room is spinning or tilting? (i.e. vertigo)     Not really 4. SEVERITY: How bad is it?  Do you feel like you are going to faint? Can you stand and walk?   - MILD: Feels slightly dizzy, but walking normally.   - MODERATE: Feels unsteady when walking, but not falling; interferes with normal activities (e.g., school, work).   - SEVERE: Unable to walk without falling, or requires assistance to walk without falling; feels like passing out now.      Mild - reports has not passed out 5. ONSET:  When did the dizziness begin?     yesterday 6. AGGRAVATING FACTORS: Does anything make it worse? (e.g., standing, change in head position)     N/a 7. HEART RATE: Can you tell me your heart rate? How  many beats in 15 seconds?  (Note: not all patients can do this)       N/a 8. CAUSE: What do you think is causing the dizziness?     unknown 9. RECURRENT SYMPTOM: Have you had dizziness before? If Yes, ask: When was the last time? What happened that time?     First time - does not recall 10. OTHER SYMPTOMS: Do you have any other symptoms? (e.g., fever, chest pain, vomiting, diarrhea, bleeding)       Some nausea, denies other sx 11. PREGNANCY: Is there any chance you are pregnant? When was your last menstrual period?       N/a    Triager attempted to schedule with PCP, but no access. Triager recommended closes UC for further evaluation/tx.  Protocols used: Dizziness - Lightheadedness-A-AH

## 2023-10-06 DIAGNOSIS — F4323 Adjustment disorder with mixed anxiety and depressed mood: Secondary | ICD-10-CM | POA: Diagnosis not present

## 2023-10-06 NOTE — Telephone Encounter (Signed)
 Follow up with pt. Pt reports he went to [facility] yesterday. He states he still having R leg swelling and unheal wound on lower leg. He updates UC provider prescribed him a doxycycline  for infection on his wound. He continues that he was advise to follow up with pcp and discuss seeing a vascular provider.   Pt updates he has not received any call regarding to cardiology referral from May. Inform him the referral was sent to [facility]. Given pt office contact number for him to reach out.   Pt has upcoming on 10/27/2023 with Dr. Charlett.   Should pt schedule an earlier appt to follow up?

## 2023-10-07 DIAGNOSIS — S81801D Unspecified open wound, right lower leg, subsequent encounter: Secondary | ICD-10-CM | POA: Diagnosis not present

## 2023-10-07 DIAGNOSIS — Z96652 Presence of left artificial knee joint: Secondary | ICD-10-CM | POA: Diagnosis not present

## 2023-10-07 DIAGNOSIS — Z471 Aftercare following joint replacement surgery: Secondary | ICD-10-CM | POA: Diagnosis not present

## 2023-10-14 ENCOUNTER — Ambulatory Visit: Payer: Self-pay | Admitting: Internal Medicine

## 2023-10-14 ENCOUNTER — Encounter: Payer: Self-pay | Admitting: Internal Medicine

## 2023-10-14 VITALS — BP 104/64 | HR 61 | Temp 97.5°F | Ht 63.5 in | Wt 204.2 lb

## 2023-10-14 DIAGNOSIS — L089 Local infection of the skin and subcutaneous tissue, unspecified: Secondary | ICD-10-CM

## 2023-10-14 DIAGNOSIS — E871 Hypo-osmolality and hyponatremia: Secondary | ICD-10-CM

## 2023-10-14 DIAGNOSIS — T148XXA Other injury of unspecified body region, initial encounter: Secondary | ICD-10-CM

## 2023-10-14 DIAGNOSIS — R233 Spontaneous ecchymoses: Secondary | ICD-10-CM

## 2023-10-14 DIAGNOSIS — D649 Anemia, unspecified: Secondary | ICD-10-CM

## 2023-10-14 DIAGNOSIS — Z79899 Other long term (current) drug therapy: Secondary | ICD-10-CM

## 2023-10-14 LAB — BASIC METABOLIC PANEL WITH GFR
BUN: 16 mg/dL (ref 6–23)
CO2: 27 meq/L (ref 19–32)
Calcium: 9 mg/dL (ref 8.4–10.5)
Chloride: 103 meq/L (ref 96–112)
Creatinine, Ser: 0.79 mg/dL (ref 0.40–1.50)
GFR: 89.03 mL/min (ref >60.00)
Glucose, Bld: 78 mg/dL (ref 70–99)
Potassium: 4.3 meq/L (ref 3.5–5.1)
Sodium: 135 meq/L (ref 135–145)

## 2023-10-14 LAB — CBC WITH DIFFERENTIAL/PLATELET
Basophils Absolute: 0 K/uL (ref 0.0–0.1)
Basophils Relative: 0.6 % (ref 0.0–3.0)
Eosinophils Absolute: 0.5 K/uL (ref 0.0–0.7)
Eosinophils Relative: 9.8 % — ABNORMAL HIGH (ref 0.0–5.0)
HCT: 39 % (ref 39.0–52.0)
Hemoglobin: 13.3 g/dL (ref 13.0–17.0)
Lymphocytes Relative: 11.7 % — ABNORMAL LOW (ref 12.0–46.0)
Lymphs Abs: 0.6 K/uL — ABNORMAL LOW (ref 0.7–4.0)
MCHC: 34.1 g/dL (ref 30.0–36.0)
MCV: 105.4 fl — ABNORMAL HIGH (ref 78.0–100.0)
Monocytes Absolute: 0.8 K/uL (ref 0.1–1.0)
Monocytes Relative: 15.1 % — ABNORMAL HIGH (ref 3.0–12.0)
Neutro Abs: 3.4 K/uL (ref 1.4–7.7)
Neutrophils Relative %: 62.8 % (ref 43.0–77.0)
Platelets: 148 K/uL — ABNORMAL LOW (ref 150.0–400.0)
RBC: 3.7 Mil/uL — ABNORMAL LOW (ref 4.22–5.81)
RDW: 15.1 % (ref 11.5–15.5)
WBC: 5.4 K/uL (ref 4.0–10.5)

## 2023-10-14 MED ORDER — DOXYCYCLINE HYCLATE 100 MG PO TABS: 100.0000 mg | ORAL_TABLET | Freq: Two times a day (BID) | ORAL | 0 refills | Status: DC

## 2023-10-14 NOTE — Progress Notes (Signed)
 Blood count about the same    , sodium level is better which is reassuring.

## 2023-10-14 NOTE — Progress Notes (Signed)
 Chief Complaint  Patient presents with   Follow-up    Wants to talk about a wound on lower right leg that will not heal. A week ago light headidness went to urgent care to get checked out.     HPI: [name] 73 y.o. come in forfu   episode  non exercise induced dizziness  pre syncopal sx .    Unusual  wonders if low sodium and overhydration . Went to urgent care.     Working  on anger issues  but doing ok .  Prob not related  may have been from fluid intake and other  no recurrence  . Had card appt  but no cp sob or racing heart .  Last daY OF DOXYCYCLINE  . Prolonged wound healing  using non stick dressing   wound appt not until aaug  . No fever  did have  puncture stick into same left medial no new bleeding  . Using compression stockings also   Is wanting to get back in water  to get back to [place]   knee surgery went well  getting better rehab strength  ROS: See pertinent positives and negatives per HPI. No asthma flar   Past Medical History:  Diagnosis Date   Allergy     Anxiety    Arthritis    knees   Asthma    Depression    Dysrhythmia    RBBB   Elevated PSA    us  and bx negative 2011   Hypertension    nl cardiolite in 1998   Pneumonia 2005   right hospitalized    Rhinosinusitis    Sleep apnea    wears CPAP    Family History  Problem Relation Age of Onset   Heart failure Father        MV replacement   Myelodysplastic syndrome Father    Asthma Mother    Polycythemia Brother    Asthma Other    Hypertension Other    Diabetes Maternal Grandmother    Healthy Sister    Healthy Brother    Healthy Brother    Colon cancer Neg Hx    Pancreatic cancer Neg Hx    Stomach cancer Neg Hx    Esophageal cancer Neg Hx    Colon polyps Neg Hx    Rectal cancer Neg Hx     Social History   Socioeconomic History   Marital status: Single    Spouse name: Not on file   Number of children: Not on file   Years of education: Not on file   Highest education level:  Bachelor's degree (e.g., BA, AB, BS)  Occupational History   Occupation: Higher education careers adviser    Comment: 40 hours  Tobacco Use   Smoking status: Never   Smokeless tobacco: Never  Vaping Use   Vaping status: Never Used  Substance and Sexual Activity   Alcohol  use: No    Comment: Quit drinking ETOH 1986   Drug use: No   Sexual activity: Not Currently  Other Topics Concern   Not on file  Social History Narrative    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   Stopped drinking in 1986    Nonosmoker    Single   HHof 1    Social Drivers of Health   Financial Resource Strain: Low Risk  (07/06/2023)   Overall Financial Resource Strain (CARDIA)    Difficulty of Paying Living Expenses: Not hard at all  Food Insecurity: No Food  Insecurity (08/18/2023)   Hunger Vital Sign    Worried About Running Out of Food in the Last Year: Never true    Ran Out of Food in the Last Year: Never true  Transportation Needs: No Transportation Needs (08/18/2023)   PRAPARE - Administrator, Civil Service (Medical): No    Lack of Transportation (Non-Medical): No  Physical Activity: Insufficiently Active (07/06/2023)   Exercise Vital Sign    Days of Exercise per Week: 2 days    Minutes of Exercise per Session: 60 min  Stress: No Stress Concern Present (07/06/2023)   [employer] of Occupational Health - Occupational Stress Questionnaire    Feeling of Stress : Not at all  Recent Concern: Stress - Stress Concern Present (06/16/2023)   [employer] of Occupational Health - Occupational Stress Questionnaire    Feeling of Stress : To some extent  Social Connections: Moderately Integrated (08/18/2023)   Social Connection and Isolation Panel    Frequency of Communication with Friends and Family: More than three times a week    Frequency of Social Gatherings with Friends and Family: More than three times a week    Attends Religious Services: More than 4 times per year    Active Member of  [employer] or Organizations: Yes    Attends Engineer, structural: More than 4 times per year    Marital Status: Divorced  Recent Concern: Social Connections - Moderately Isolated (06/16/2023)   Social Connection and Isolation Panel    Frequency of Communication with Friends and Family: More than three times a week    Frequency of Social Gatherings with Friends and Family: Three times a week    Attends Religious Services: Never    Active Member of Clubs or Organizations: Yes    Attends Engineer, structural: More than 4 times per year    Marital Status: Never married    Outpatient Medications Prior to Visit  Medication Sig Dispense Refill   amLODipine -benazepril  (LOTREL) 5-10 MG capsule TAKE 1 CAPSULE BY MOUTH EVERY DAY 90 capsule 2   Artificial Tear Solution (SOOTHE XP) SOLN Place 1 drop into both eyes 3 (three) times daily as needed (for dryness).     Azelastine  HCl 137 MCG/SPRAY SOLN PLACE 1-2 SPRAYS INTO BOTH NOSTRILS 2 (TWO) TIMES DAILY AS NEEDED FOR RHINITIS. 137 mL 12   clobetasol (OLUX) 0.05 % topical foam Apply 1 application  topically daily as needed (scalp itching).     Cyanocobalamin  (B-12) 1000 MCG SUBL Place 1,000 mcg under the tongue daily.     escitalopram  (LEXAPRO ) 10 MG tablet Take 1 tablet (10 mg total) by mouth daily. 90 tablet 2   fexofenadine (ALLEGRA) 180 MG tablet Take 180 mg by mouth daily.     fluticasone  (FLONASE ) 50 MCG/ACT nasal spray SPRAY 2 SPRAYS INTO EACH NOSTRIL EVERY DAY 48 mL 2   Ipratropium-Albuterol  (COMBIVENT  RESPIMAT) 20-100 MCG/ACT AERS respimat Inhale 1 puff up to four times daily as needed 4 g 11   Olopatadine  HCl (PATADAY ) 0.7 % SOLN Place 1 drop into both eyes daily.     PRESCRIPTION MEDICATION CPAP- At bedtime and during any time of rest     tadalafil (CIALIS) 5 MG tablet Take 1 tablet by mouth daily.     triamcinolone  cream (KENALOG) 0.1 % Apply 1 application  topically daily as needed (itching).     WIXELA INHUB 100-50 MCG/ACT AEPB  INHALE 1 PUFF BY MOUTH TWICE A DAY 60 each 6  cefadroxil (DURICEF) 500 MG capsule Take 500 mg by mouth 2 (two) times daily. (Patient not taking: Reported on 10/14/2023)     erythromycin  ophthalmic ointment Place 1 Application into the left eye 2 (two) times daily. (Patient not taking: Reported on 10/14/2023)     methocarbamol  (ROBAXIN ) 500 MG tablet Take 1 tablet (500 mg total) by mouth every 6 (six) hours as needed for muscle spasms. (Patient not taking: Reported on 10/14/2023) 40 tablet 2   oxyCODONE  (OXY IR/ROXICODONE ) 5 MG immediate release tablet Take 1 tablet (5 mg total) by mouth every 4 (four) hours as needed for severe pain (pain score 7-10). (Patient not taking: Reported on 10/14/2023) 30 tablet 0   polyethylene glycol (MIRALAX  / GLYCOLAX ) 17 g packet Take 17 g by mouth 2 (two) times daily. (Patient not taking: Reported on 10/14/2023) 14 each 0   No facility-administered medications prior to visit.     EXAM:  BP 104/64   Pulse 61   Temp (!) 97.5 F (36.4 C) (Temporal)   Ht 5' 3.5 (1.613 m)   Wt 204 lb 3.2 oz (92.6 kg)   SpO2 96%   BMI 35.61 kg/m   Body mass index is 35.61 kg/m.  GENERAL: vitals reviewed and listed above, alert, oriented, appears well hydrated and in no acute distress  NECK: no obvious masses on inspection palpation  LUNGS: clear to auscultation bilaterally, no wheezes, rales or rhonchi, good air movement CV: HRRR, no clubbing cyanosis 1+ edema r more than lef near wound  peripheral edema nl cap refill  MS: moves all extremities without noticeable focal  abnormality Skin forearm senile ecchymosis  , r le shin area  about 2.5 + ovoid healing ulcer some exudate  base pink red  granulation  no streaking   local erythema  non tender   superficial abrasion medial  less than 1 cm noted  PSYCH: pleasant and cooperative, no obvious depression or anxiety Lab Results  Component Value Date   WBC 7.3 08/19/2023   HGB 13.5 08/19/2023   HCT 39.4 08/19/2023   PLT 146  (L) 08/19/2023   GLUCOSE 185 (H) 08/19/2023   CHOL 175 02/11/2023   TRIG 76.0 02/11/2023   HDL 46.40 02/11/2023   LDLCALC 114 (H) 02/11/2023   ALT 18 02/11/2023   AST 19 02/11/2023   NA 130 (L) 08/19/2023   K 4.3 08/19/2023   CL 98 08/19/2023   CREATININE 0.54 (L) 08/19/2023   BUN 12 08/19/2023   CO2 23 08/19/2023   TSH 2.22 02/11/2023   PSA 6.58 07/27/2023   INR 1.0 12/24/2020   HGBA1C 5.5 02/11/2023   BP Readings from Last 3 Encounters:  10/14/23 104/64  08/19/23 (!) 143/72  08/11/23 120/64    ASSESSMENT AND PLAN:  Discussed the following assessment and plan:  Hyponatremia - Plan: Basic metabolic panel with GFR, CBC with Differential/Platelet  Mild anemia - Plan: CBC with Differential/Platelet  Wound infection  Medication management  Senile ecchymosiss  odium 130 133 rang poss combo of med and hydration status  uncertain if related to prev dizzy sx.    Card consult pending  Fu lab today   Prolonged wound  healing  now as ovoid  ulcera like   local care cont antibiotic   try adding  honey based topicals  in interim as well as local care and protection.  -Patient advised to return or notify health care team  if  new concerns arise.  Patient Instructions  Continue doxycycline   Updatiing  sodium and hg levels today .   Try topical medical honey preps .   To help with wound . Send in pix of wound in 1-2 weeks and then go from there.    Evalise Abruzzese K. Loretta Doutt M.D.

## 2023-10-14 NOTE — Patient Instructions (Signed)
 Continue doxycycline    Updatiing  sodium and hg levels today .   Try topical medical honey preps .   To help with wound . Send in pix of wound in 1-2 weeks and then go from there.

## 2023-10-18 DIAGNOSIS — F4323 Adjustment disorder with mixed anxiety and depressed mood: Secondary | ICD-10-CM | POA: Diagnosis not present

## 2023-10-19 ENCOUNTER — Ambulatory Visit: Admitting: Family Medicine

## 2023-10-20 DIAGNOSIS — F4323 Adjustment disorder with mixed anxiety and depressed mood: Secondary | ICD-10-CM | POA: Diagnosis not present

## 2023-10-22 DIAGNOSIS — G4733 Obstructive sleep apnea (adult) (pediatric): Secondary | ICD-10-CM | POA: Diagnosis not present

## 2023-10-26 ENCOUNTER — Ambulatory Visit: Admitting: Podiatry

## 2023-10-26 ENCOUNTER — Encounter: Payer: Self-pay | Admitting: Podiatry

## 2023-10-26 DIAGNOSIS — M79674 Pain in right toe(s): Secondary | ICD-10-CM | POA: Diagnosis not present

## 2023-10-26 DIAGNOSIS — D696 Thrombocytopenia, unspecified: Secondary | ICD-10-CM | POA: Diagnosis not present

## 2023-10-26 DIAGNOSIS — B351 Tinea unguium: Secondary | ICD-10-CM

## 2023-10-26 DIAGNOSIS — M79675 Pain in left toe(s): Secondary | ICD-10-CM

## 2023-10-26 NOTE — Progress Notes (Signed)
 This patient presents to the office with chief complaint of long thick painful nails.  Patient says the nails are painful walking and wearing shoes.  This patient is unable to self treat.  This patient is unable to trim his  nails since he is unable to reach his nails.  he presents to the office for preventative foot care services. Patient had eye surgery and leg laceration recently.  General Appearance  Alert, conversant and in no acute stress.  Vascular  Dorsalis pedis and posterior tibial  pulses are palpable  bilaterally.  Capillary return is within normal limits  bilaterally. Temperature is within normal limits  bilaterally.  Neurologic  Senn-Weinstein monofilament wire test within normal limits  bilaterally. Muscle power within normal limits bilaterally.  Nails Thick disfigured discolored nails with subungual debris  from hallux to fifth toes bilaterally. No evidence of bacterial infection or drainage bilaterally.  Orthopedic  No limitations of motion  feet .  No crepitus or effusions noted.  No bony pathology or digital deformities noted.  Skin  normotropic skin with no porokeratosis noted bilaterally.  No signs of infections or ulcers noted.     Onychomycosis  Nails  B/L.  Pain in right toes  Pain in left toes  Debridement of nails both feet followed trimming the nails with dremel tool.    RTC 10 weeks    Cordella Bold DPM

## 2023-10-27 ENCOUNTER — Ambulatory Visit: Admitting: Internal Medicine

## 2023-10-29 DIAGNOSIS — D225 Melanocytic nevi of trunk: Secondary | ICD-10-CM | POA: Diagnosis not present

## 2023-10-29 DIAGNOSIS — L820 Inflamed seborrheic keratosis: Secondary | ICD-10-CM | POA: Diagnosis not present

## 2023-10-29 DIAGNOSIS — L98499 Non-pressure chronic ulcer of skin of other sites with unspecified severity: Secondary | ICD-10-CM | POA: Diagnosis not present

## 2023-10-29 DIAGNOSIS — Z85828 Personal history of other malignant neoplasm of skin: Secondary | ICD-10-CM | POA: Diagnosis not present

## 2023-10-29 DIAGNOSIS — L821 Other seborrheic keratosis: Secondary | ICD-10-CM | POA: Diagnosis not present

## 2023-10-29 DIAGNOSIS — D692 Other nonthrombocytopenic purpura: Secondary | ICD-10-CM | POA: Diagnosis not present

## 2023-10-30 ENCOUNTER — Other Ambulatory Visit: Payer: Self-pay | Admitting: Internal Medicine

## 2023-11-11 ENCOUNTER — Encounter (HOSPITAL_BASED_OUTPATIENT_CLINIC_OR_DEPARTMENT_OTHER): Payer: Self-pay | Attending: Internal Medicine | Admitting: Internal Medicine

## 2023-11-11 DIAGNOSIS — S80811D Abrasion, right lower leg, subsequent encounter: Secondary | ICD-10-CM | POA: Insufficient documentation

## 2023-11-11 DIAGNOSIS — I87311 Chronic venous hypertension (idiopathic) with ulcer of right lower extremity: Secondary | ICD-10-CM | POA: Diagnosis not present

## 2023-11-11 DIAGNOSIS — W228XXD Striking against or struck by other objects, subsequent encounter: Secondary | ICD-10-CM | POA: Insufficient documentation

## 2023-11-11 DIAGNOSIS — L97818 Non-pressure chronic ulcer of other part of right lower leg with other specified severity: Secondary | ICD-10-CM | POA: Insufficient documentation

## 2023-11-11 DIAGNOSIS — L97812 Non-pressure chronic ulcer of other part of right lower leg with fat layer exposed: Secondary | ICD-10-CM | POA: Diagnosis not present

## 2023-11-16 NOTE — Progress Notes (Unsigned)
 Subjective:    Patient ID: [name], male    DOB: [birth year], 73 y.o.   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 FENO-12/26/2015- 30 (mildly elevated) ANCA- neg ANA + 1:80 Respiratory Allergy  Profile 06/11/16--total IgE WNL 88, mild elevations for dust mite, cat, dog, aspergillus fumigatus, hickory tree pollen Office Spirometry 06/11/2016-WNL-FVC 3.95/103%, FEV1 2.71/95%, ratio 0.69, FEF 25-75% 1.60/ 69% Office Spirometry 06/11/17-minimal obstructive airways disease, small airways.  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  Tolerated well with no respiratory problems.  Has not been diving recently because of this but hopes to start again.  Says his breathing is good, not needing rescue inhaler.  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 HEENT:   No-  headaches, difficulty swallowing,  tooth/dental problems, sore throat,       No-  sneezing, itching, ear ache, nasal congestion, post nasal drip,  CV:  No-   chest pain, orthopnea, PND, +swelling in lower extremities, anasarca, dizziness, palpitations Resp: No-   shortness of breath with exertion or at rest.              No-   productive cough,  No non-productive cough,  No- coughing up of blood.              No-   change in color of mucus.   wheezing.   Skin: No-   rash or lesions. GI:  No-   heartburn, indigestion, abdominal pain, nausea, vomiting,  GU: n. MS:  No-   joint pain or swelling.   Neuro-  +early Parkinson's Psych:  No- change in mood or affect. No depression or anxiety.  No memory loss.  OBJ- Physical Exam General- Alert, Oriented, Affect-appropriate, Distress- none acute, + obese Skin-  Lymphadenopathy- none Head- atraumatic            Eyes- Gross vision intact, PERRLA, conjunctivae and secretions clear            Ears- Hearing, canals-normal            Nose-  + external dev, no- mucus, erosion, perforation             Throat- Mallampati II , mucosa clear , drainage- none, tonsils- atrophic Neck- flexible , trachea midline, no stridor , thyroid  nl, carotid no bruit Chest - symmetrical excursion , unlabored           Heart/CV- RRR , no murmur , no gallop  , no rub, nl s1 s2                           -  JVD- none , edema- none, stasis changes- none, varices- none           Lung- clear to P&A, wheeze- none, cough- none , dullness-none, rub- none           Chest wall-  Abd-  Br/ Gen/ Rectal- Not done, not indicated Extrem- cyanosis- none, clubbing, none, atrophy- none, strength- nl, + surgical scar L knee,  Neuro-non-focal

## 2023-11-17 ENCOUNTER — Encounter (HOSPITAL_BASED_OUTPATIENT_CLINIC_OR_DEPARTMENT_OTHER): Admitting: Internal Medicine

## 2023-11-17 DIAGNOSIS — I87311 Chronic venous hypertension (idiopathic) with ulcer of right lower extremity: Secondary | ICD-10-CM

## 2023-11-17 DIAGNOSIS — S80811D Abrasion, right lower leg, subsequent encounter: Secondary | ICD-10-CM

## 2023-11-17 DIAGNOSIS — L97818 Non-pressure chronic ulcer of other part of right lower leg with other specified severity: Secondary | ICD-10-CM

## 2023-11-19 ENCOUNTER — Ambulatory Visit: Admitting: Internal Medicine

## 2023-11-19 ENCOUNTER — Encounter: Payer: Self-pay | Admitting: Internal Medicine

## 2023-11-19 VITALS — BP 140/70 | HR 64 | Temp 97.5°F | Ht 64.0 in | Wt 196.8 lb

## 2023-11-19 DIAGNOSIS — G4733 Obstructive sleep apnea (adult) (pediatric): Secondary | ICD-10-CM | POA: Diagnosis not present

## 2023-11-19 NOTE — Patient Instructions (Signed)
 Glad you are doing well with the sleep apnea and asthma  We can continue CPAP auto 10-20  We can continue current meds  Please call if we can help

## 2023-11-22 DIAGNOSIS — G4733 Obstructive sleep apnea (adult) (pediatric): Secondary | ICD-10-CM | POA: Diagnosis not present

## 2023-11-24 ENCOUNTER — Encounter (HOSPITAL_BASED_OUTPATIENT_CLINIC_OR_DEPARTMENT_OTHER): Admitting: Internal Medicine

## 2023-11-24 DIAGNOSIS — Z825 Family history of asthma and other chronic lower respiratory diseases: Secondary | ICD-10-CM | POA: Diagnosis not present

## 2023-11-24 DIAGNOSIS — Z91012 Allergy to eggs: Secondary | ICD-10-CM | POA: Diagnosis not present

## 2023-11-24 DIAGNOSIS — R21 Rash and other nonspecific skin eruption: Secondary | ICD-10-CM | POA: Diagnosis not present

## 2023-11-24 DIAGNOSIS — Z79899 Other long term (current) drug therapy: Secondary | ICD-10-CM | POA: Diagnosis not present

## 2023-11-24 DIAGNOSIS — F32A Depression, unspecified: Secondary | ICD-10-CM | POA: Diagnosis not present

## 2023-11-24 DIAGNOSIS — M25562 Pain in left knee: Secondary | ICD-10-CM | POA: Diagnosis not present

## 2023-11-24 DIAGNOSIS — L97818 Non-pressure chronic ulcer of other part of right lower leg with other specified severity: Secondary | ICD-10-CM | POA: Diagnosis not present

## 2023-11-24 DIAGNOSIS — I4510 Unspecified right bundle-branch block: Secondary | ICD-10-CM | POA: Diagnosis not present

## 2023-11-24 DIAGNOSIS — Z7982 Long term (current) use of aspirin: Secondary | ICD-10-CM | POA: Diagnosis not present

## 2023-11-24 DIAGNOSIS — L03116 Cellulitis of left lower limb: Secondary | ICD-10-CM

## 2023-11-24 DIAGNOSIS — Z96611 Presence of right artificial shoulder joint: Secondary | ICD-10-CM | POA: Diagnosis present

## 2023-11-24 DIAGNOSIS — I87311 Chronic venous hypertension (idiopathic) with ulcer of right lower extremity: Secondary | ICD-10-CM | POA: Diagnosis not present

## 2023-11-24 DIAGNOSIS — J984 Other disorders of lung: Secondary | ICD-10-CM | POA: Diagnosis not present

## 2023-11-24 DIAGNOSIS — R918 Other nonspecific abnormal finding of lung field: Secondary | ICD-10-CM | POA: Diagnosis not present

## 2023-11-24 DIAGNOSIS — M71162 Other infective bursitis, left knee: Secondary | ICD-10-CM | POA: Diagnosis not present

## 2023-11-24 DIAGNOSIS — M7042 Prepatellar bursitis, left knee: Secondary | ICD-10-CM | POA: Diagnosis not present

## 2023-11-24 DIAGNOSIS — J45909 Unspecified asthma, uncomplicated: Secondary | ICD-10-CM | POA: Diagnosis not present

## 2023-11-24 DIAGNOSIS — S80811D Abrasion, right lower leg, subsequent encounter: Secondary | ICD-10-CM

## 2023-11-24 DIAGNOSIS — B9561 Methicillin susceptible Staphylococcus aureus infection as the cause of diseases classified elsewhere: Secondary | ICD-10-CM | POA: Diagnosis not present

## 2023-11-24 DIAGNOSIS — Z91048 Other nonmedicinal substance allergy status: Secondary | ICD-10-CM | POA: Diagnosis not present

## 2023-11-24 DIAGNOSIS — Z7951 Long term (current) use of inhaled steroids: Secondary | ICD-10-CM | POA: Diagnosis not present

## 2023-11-24 DIAGNOSIS — M13862 Other specified arthritis, left knee: Secondary | ICD-10-CM | POA: Diagnosis not present

## 2023-11-24 DIAGNOSIS — Z832 Family history of diseases of the blood and blood-forming organs and certain disorders involving the immune mechanism: Secondary | ICD-10-CM | POA: Diagnosis not present

## 2023-11-24 DIAGNOSIS — F418 Other specified anxiety disorders: Secondary | ICD-10-CM | POA: Diagnosis not present

## 2023-11-24 DIAGNOSIS — I10 Essential (primary) hypertension: Secondary | ICD-10-CM | POA: Diagnosis not present

## 2023-11-24 DIAGNOSIS — Z96652 Presence of left artificial knee joint: Secondary | ICD-10-CM | POA: Diagnosis not present

## 2023-11-24 DIAGNOSIS — Z8249 Family history of ischemic heart disease and other diseases of the circulatory system: Secondary | ICD-10-CM | POA: Diagnosis not present

## 2023-11-24 DIAGNOSIS — Z833 Family history of diabetes mellitus: Secondary | ICD-10-CM | POA: Diagnosis not present

## 2023-11-24 DIAGNOSIS — J90 Pleural effusion, not elsewhere classified: Secondary | ICD-10-CM | POA: Diagnosis not present

## 2023-11-24 DIAGNOSIS — Z452 Encounter for adjustment and management of vascular access device: Secondary | ICD-10-CM | POA: Diagnosis not present

## 2023-11-25 ENCOUNTER — Inpatient Hospital Stay (HOSPITAL_COMMUNITY)
Admission: AD | Admit: 2023-11-25 | Discharge: 2023-12-01 | DRG: 501 | Disposition: A | Discharge status: Home Health | Source: Ambulatory Visit | Attending: Orthopedic Surgery | Admitting: Orthopedic Surgery

## 2023-11-25 ENCOUNTER — Other Ambulatory Visit: Payer: Self-pay

## 2023-11-25 ENCOUNTER — Encounter (HOSPITAL_COMMUNITY): Payer: Self-pay

## 2023-11-25 ENCOUNTER — Encounter (HOSPITAL_COMMUNITY): Payer: Self-pay | Admitting: Orthopedic Surgery

## 2023-11-25 DIAGNOSIS — Z8249 Family history of ischemic heart disease and other diseases of the circulatory system: Secondary | ICD-10-CM

## 2023-11-25 DIAGNOSIS — I4510 Unspecified right bundle-branch block: Secondary | ICD-10-CM | POA: Diagnosis present

## 2023-11-25 DIAGNOSIS — Z96653 Presence of artificial knee joint, bilateral: Secondary | ICD-10-CM | POA: Diagnosis present

## 2023-11-25 DIAGNOSIS — Z96652 Presence of left artificial knee joint: Secondary | ICD-10-CM | POA: Diagnosis present

## 2023-11-25 DIAGNOSIS — Z7951 Long term (current) use of inhaled steroids: Secondary | ICD-10-CM

## 2023-11-25 DIAGNOSIS — B9561 Methicillin susceptible Staphylococcus aureus infection as the cause of diseases classified elsewhere: Secondary | ICD-10-CM | POA: Diagnosis present

## 2023-11-25 DIAGNOSIS — L03116 Cellulitis of left lower limb: Secondary | ICD-10-CM | POA: Diagnosis present

## 2023-11-25 DIAGNOSIS — J45909 Unspecified asthma, uncomplicated: Secondary | ICD-10-CM | POA: Diagnosis present

## 2023-11-25 DIAGNOSIS — I10 Essential (primary) hypertension: Secondary | ICD-10-CM | POA: Diagnosis present

## 2023-11-25 DIAGNOSIS — M7042 Prepatellar bursitis, left knee: Principal | ICD-10-CM

## 2023-11-25 DIAGNOSIS — Z91048 Other nonmedicinal substance allergy status: Secondary | ICD-10-CM

## 2023-11-25 DIAGNOSIS — F32A Depression, unspecified: Secondary | ICD-10-CM | POA: Diagnosis present

## 2023-11-25 DIAGNOSIS — Z833 Family history of diabetes mellitus: Secondary | ICD-10-CM

## 2023-11-25 DIAGNOSIS — M71162 Other infective bursitis, left knee: Principal | ICD-10-CM | POA: Diagnosis present

## 2023-11-25 DIAGNOSIS — Z825 Family history of asthma and other chronic lower respiratory diseases: Secondary | ICD-10-CM

## 2023-11-25 DIAGNOSIS — Z96611 Presence of right artificial shoulder joint: Secondary | ICD-10-CM | POA: Diagnosis present

## 2023-11-25 DIAGNOSIS — Z7982 Long term (current) use of aspirin: Secondary | ICD-10-CM

## 2023-11-25 DIAGNOSIS — Z832 Family history of diseases of the blood and blood-forming organs and certain disorders involving the immune mechanism: Secondary | ICD-10-CM

## 2023-11-25 DIAGNOSIS — G4733 Obstructive sleep apnea (adult) (pediatric): Secondary | ICD-10-CM | POA: Diagnosis present

## 2023-11-25 DIAGNOSIS — Z79899 Other long term (current) drug therapy: Secondary | ICD-10-CM

## 2023-11-25 DIAGNOSIS — M25562 Pain in left knee: Secondary | ICD-10-CM | POA: Diagnosis not present

## 2023-11-25 DIAGNOSIS — Z91012 Allergy to eggs: Secondary | ICD-10-CM

## 2023-11-25 LAB — BASIC METABOLIC PANEL WITH GFR
Anion gap: 12 (ref 5–15)
BUN: 10 mg/dL (ref 8–23)
CO2: 23 mmol/L (ref 22–32)
Calcium: 9.3 mg/dL (ref 8.9–10.3)
Chloride: 98 mmol/L (ref 98–111)
Creatinine, Ser: 0.66 mg/dL (ref 0.61–1.24)
GFR, Estimated: >60 mL/min (ref >60)
Glucose, Bld: 114 mg/dL — ABNORMAL HIGH (ref 70–99)
Potassium: 4.9 mmol/L (ref 3.5–5.1)
Sodium: 132 mmol/L — ABNORMAL LOW (ref 135–145)

## 2023-11-25 LAB — CBC
HCT: 37.8 % — ABNORMAL LOW (ref 39.0–52.0)
Hemoglobin: 12.2 g/dL — ABNORMAL LOW (ref 13.0–17.0)
MCH: 34.4 pg — ABNORMAL HIGH (ref 26.0–34.0)
MCHC: 32.3 g/dL (ref 30.0–36.0)
MCV: 106.5 fL — ABNORMAL HIGH (ref 80.0–100.0)
Platelets: 124 K/uL — ABNORMAL LOW (ref 150–400)
RBC: 3.55 MIL/uL — ABNORMAL LOW (ref 4.22–5.81)
RDW: 14.5 % (ref 11.5–15.5)
WBC: 11 K/uL — ABNORMAL HIGH (ref 4.0–10.5)
nRBC: 0 % (ref 0.0–0.2)

## 2023-11-25 MED ORDER — OLOPATADINE HCL 0.1 % OP SOLN
1.0000 [drp] | Freq: Every day | OPHTHALMIC | Status: DC
  Administered 2023-11-27 – 2023-12-01 (×5): 1 [drp] via OPHTHALMIC
  Filled 2023-11-25: qty 5

## 2023-11-25 MED ORDER — AMLODIPINE BESYLATE 5 MG PO TABS
5.0000 mg | ORAL_TABLET | Freq: Every day | ORAL | Status: DC
  Administered 2023-11-26 – 2023-12-01 (×6): 5 mg via ORAL
  Filled 2023-11-25 (×6): qty 1

## 2023-11-25 MED ORDER — FLUTICASONE PROPIONATE 50 MCG/ACT NA SUSP
2.0000 | Freq: Every day | NASAL | Status: DC
  Administered 2023-11-27 – 2023-12-01 (×5): 2 via NASAL
  Filled 2023-11-25: qty 16

## 2023-11-25 MED ORDER — IPRATROPIUM-ALBUTEROL 0.5-2.5 (3) MG/3ML IN SOLN: 3.0000 mL | Freq: Four times a day (QID) | RESPIRATORY_TRACT | Status: DC | PRN

## 2023-11-25 MED ORDER — ESCITALOPRAM OXALATE 10 MG PO TABS
10.0000 mg | ORAL_TABLET | Freq: Every day | ORAL | Status: DC
  Administered 2023-11-26 – 2023-12-01 (×6): 10 mg via ORAL
  Filled 2023-11-25 (×6): qty 1

## 2023-11-25 MED ORDER — SOOTHE XP OP SOLN: 1.0000 [drp] | Freq: Three times a day (TID) | OPHTHALMIC | Status: DC | PRN

## 2023-11-25 MED ORDER — HYDROXYZINE HCL 25 MG PO TABS
25.0000 mg | ORAL_TABLET | Freq: Three times a day (TID) | ORAL | Status: DC | PRN
  Administered 2023-11-25: 25 mg via ORAL
  Filled 2023-11-25: qty 1

## 2023-11-25 MED ORDER — OLOPATADINE HCL 0.7 % OP SOLN: 1.0000 [drp] | Freq: Every day | OPHTHALMIC | Status: DC

## 2023-11-25 MED ORDER — BENAZEPRIL HCL 20 MG PO TABS
10.0000 mg | ORAL_TABLET | Freq: Every day | ORAL | Status: DC
  Administered 2023-11-26 – 2023-12-01 (×6): 10 mg via ORAL
  Filled 2023-11-25 (×6): qty 1

## 2023-11-25 MED ORDER — LORATADINE 10 MG PO TABS
10.0000 mg | ORAL_TABLET | Freq: Every day | ORAL | Status: DC
  Administered 2023-11-26 – 2023-12-01 (×6): 10 mg via ORAL
  Filled 2023-11-25 (×6): qty 1

## 2023-11-25 MED ORDER — CEFAZOLIN SODIUM-DEXTROSE 2-4 GM/100ML-% IV SOLN
2.0000 g | Freq: Three times a day (TID) | INTRAVENOUS | Status: DC
Start: 2023-11-25 — End: 2023-12-01
  Administered 2023-11-25 – 2023-12-01 (×19): 2 g via INTRAVENOUS
  Filled 2023-11-25 (×19): qty 100

## 2023-11-25 MED ORDER — POLYVINYL ALCOHOL 1.4 % OP SOLN: 1.0000 [drp] | Freq: Three times a day (TID) | OPHTHALMIC | Status: DC | PRN

## 2023-11-25 MED ORDER — IPRATROPIUM-ALBUTEROL 20-100 MCG/ACT IN AERS: 1.0000 | INHALATION_SPRAY | Freq: Four times a day (QID) | RESPIRATORY_TRACT | Status: DC | PRN

## 2023-11-25 MED ORDER — FLUTICASONE FUROATE-VILANTEROL 100-25 MCG/ACT IN AEPB
1.0000 | INHALATION_SPRAY | Freq: Every day | RESPIRATORY_TRACT | Status: DC
  Administered 2023-11-26 – 2023-12-01 (×6): 1 via RESPIRATORY_TRACT
  Filled 2023-11-25: qty 28

## 2023-11-25 NOTE — Plan of Care (Signed)
 ?  Problem: Coping: ?Goal: Level of anxiety will decrease ?Outcome: Progressing ?  ?Problem: Safety: ?Goal: Ability to remain free from injury will improve ?Outcome: Progressing ?  ?

## 2023-11-25 NOTE — Progress Notes (Signed)
 Pt's right lower extremity is wrapped up in compression wrap from below the knee to the foot. Pt stated that it was applied at Dr's office and refused this nurse to assess it. Other skin assessments are completed and dressing applied as needed.

## 2023-11-26 ENCOUNTER — Encounter (HOSPITAL_COMMUNITY): Payer: Self-pay | Admitting: Orthopedic Surgery

## 2023-11-26 ENCOUNTER — Encounter (HOSPITAL_COMMUNITY)
Admission: AD | Disposition: A | Discharge status: Home Health | Payer: Self-pay | Source: Ambulatory Visit | Attending: Orthopedic Surgery

## 2023-11-26 ENCOUNTER — Observation Stay (HOSPITAL_COMMUNITY): Admitting: Anesthesiology

## 2023-11-26 DIAGNOSIS — B9561 Methicillin susceptible Staphylococcus aureus infection as the cause of diseases classified elsewhere: Secondary | ICD-10-CM | POA: Diagnosis not present

## 2023-11-26 DIAGNOSIS — M71162 Other infective bursitis, left knee: Secondary | ICD-10-CM | POA: Diagnosis not present

## 2023-11-26 DIAGNOSIS — Z7951 Long term (current) use of inhaled steroids: Secondary | ICD-10-CM | POA: Diagnosis not present

## 2023-11-26 DIAGNOSIS — F32A Depression, unspecified: Secondary | ICD-10-CM | POA: Diagnosis present

## 2023-11-26 DIAGNOSIS — Z91048 Other nonmedicinal substance allergy status: Secondary | ICD-10-CM | POA: Diagnosis not present

## 2023-11-26 DIAGNOSIS — Z79899 Other long term (current) drug therapy: Secondary | ICD-10-CM | POA: Diagnosis not present

## 2023-11-26 DIAGNOSIS — Z96611 Presence of right artificial shoulder joint: Secondary | ICD-10-CM | POA: Diagnosis present

## 2023-11-26 DIAGNOSIS — Z91012 Allergy to eggs: Secondary | ICD-10-CM | POA: Diagnosis not present

## 2023-11-26 DIAGNOSIS — J45909 Unspecified asthma, uncomplicated: Secondary | ICD-10-CM | POA: Diagnosis not present

## 2023-11-26 DIAGNOSIS — F418 Other specified anxiety disorders: Secondary | ICD-10-CM | POA: Diagnosis not present

## 2023-11-26 DIAGNOSIS — R21 Rash and other nonspecific skin eruption: Secondary | ICD-10-CM | POA: Diagnosis not present

## 2023-11-26 DIAGNOSIS — Z825 Family history of asthma and other chronic lower respiratory diseases: Secondary | ICD-10-CM | POA: Diagnosis not present

## 2023-11-26 DIAGNOSIS — I10 Essential (primary) hypertension: Secondary | ICD-10-CM | POA: Diagnosis not present

## 2023-11-26 DIAGNOSIS — M7042 Prepatellar bursitis, left knee: Secondary | ICD-10-CM | POA: Diagnosis not present

## 2023-11-26 DIAGNOSIS — J90 Pleural effusion, not elsewhere classified: Secondary | ICD-10-CM | POA: Diagnosis not present

## 2023-11-26 DIAGNOSIS — R918 Other nonspecific abnormal finding of lung field: Secondary | ICD-10-CM | POA: Diagnosis not present

## 2023-11-26 DIAGNOSIS — Z452 Encounter for adjustment and management of vascular access device: Secondary | ICD-10-CM | POA: Diagnosis not present

## 2023-11-26 DIAGNOSIS — Z833 Family history of diabetes mellitus: Secondary | ICD-10-CM | POA: Diagnosis not present

## 2023-11-26 DIAGNOSIS — Z832 Family history of diseases of the blood and blood-forming organs and certain disorders involving the immune mechanism: Secondary | ICD-10-CM | POA: Diagnosis not present

## 2023-11-26 DIAGNOSIS — J984 Other disorders of lung: Secondary | ICD-10-CM | POA: Diagnosis not present

## 2023-11-26 DIAGNOSIS — M13862 Other specified arthritis, left knee: Secondary | ICD-10-CM | POA: Diagnosis not present

## 2023-11-26 DIAGNOSIS — Z7982 Long term (current) use of aspirin: Secondary | ICD-10-CM | POA: Diagnosis not present

## 2023-11-26 DIAGNOSIS — Z96652 Presence of left artificial knee joint: Secondary | ICD-10-CM | POA: Diagnosis not present

## 2023-11-26 DIAGNOSIS — L03116 Cellulitis of left lower limb: Secondary | ICD-10-CM | POA: Diagnosis present

## 2023-11-26 DIAGNOSIS — Z8249 Family history of ischemic heart disease and other diseases of the circulatory system: Secondary | ICD-10-CM | POA: Diagnosis not present

## 2023-11-26 DIAGNOSIS — I4510 Unspecified right bundle-branch block: Secondary | ICD-10-CM | POA: Diagnosis present

## 2023-11-26 HISTORY — PX: IRRIGATION AND DEBRIDEMENT KNEE: SHX5185

## 2023-11-26 SURGERY — IRRIGATION AND DEBRIDEMENT KNEE
Anesthesia: General | Site: Knee | Laterality: Left

## 2023-11-26 MED ORDER — FENTANYL CITRATE (PF) 100 MCG/2ML IJ SOLN
INTRAMUSCULAR | Status: DC | PRN
  Administered 2023-11-26: 50 ug via INTRAVENOUS
  Administered 2023-11-26: 25 ug via INTRAVENOUS

## 2023-11-26 MED ORDER — EPHEDRINE SULFATE (PRESSORS) 50 MG/ML IJ SOLN
INTRAMUSCULAR | Status: DC | PRN
  Administered 2023-11-26: 5 mg via INTRAVENOUS
  Administered 2023-11-26: 10 mg via INTRAVENOUS

## 2023-11-26 MED ORDER — BUPIVACAINE-EPINEPHRINE (PF) 0.25% -1:200000 IJ SOLN
INTRAMUSCULAR | Status: AC
  Filled 2023-11-26: qty 30

## 2023-11-26 MED ORDER — MENTHOL 3 MG MT LOZG: 1.0000 | LOZENGE | OROMUCOSAL | Status: DC | PRN

## 2023-11-26 MED ORDER — EPHEDRINE 5 MG/ML INJ
INTRAVENOUS | Status: AC
  Filled 2023-11-26: qty 5

## 2023-11-26 MED ORDER — ALUM & MAG HYDROXIDE-SIMETH 200-200-20 MG/5ML PO SUSP
30.0000 mL | ORAL | Status: DC | PRN
  Administered 2023-11-27: 30 mL via ORAL
  Filled 2023-11-26: qty 30

## 2023-11-26 MED ORDER — MIDAZOLAM HCL 5 MG/5ML IJ SOLN
INTRAMUSCULAR | Status: DC | PRN
Start: 2023-11-26 — End: 2023-11-26
  Administered 2023-11-26 (×2): 1 mg via INTRAVENOUS

## 2023-11-26 MED ORDER — SODIUM CHLORIDE 0.9 % IV SOLN: INTRAVENOUS | Status: DC

## 2023-11-26 MED ORDER — CHLORHEXIDINE GLUCONATE 4 % EX SOLN
60.0000 mL | Freq: Once | CUTANEOUS | Status: AC
  Administered 2023-11-26: 4 via TOPICAL

## 2023-11-26 MED ORDER — PHENYLEPHRINE 80 MCG/ML (10ML) SYRINGE FOR IV PUSH (FOR BLOOD PRESSURE SUPPORT)
PREFILLED_SYRINGE | INTRAVENOUS | Status: AC
Start: 2023-11-26 — End: 2023-11-26
  Filled 2023-11-26: qty 10

## 2023-11-26 MED ORDER — ASPIRIN 81 MG PO CHEW
81.0000 mg | CHEWABLE_TABLET | Freq: Two times a day (BID) | ORAL | Status: DC
  Administered 2023-11-26 – 2023-12-01 (×10): 81 mg via ORAL
  Filled 2023-11-26 (×10): qty 1

## 2023-11-26 MED ORDER — ONDANSETRON HCL 4 MG PO TABS: 4.0000 mg | ORAL_TABLET | Freq: Four times a day (QID) | ORAL | Status: DC | PRN

## 2023-11-26 MED ORDER — MIDAZOLAM HCL 2 MG/2ML IJ SOLN
INTRAMUSCULAR | Status: AC
  Filled 2023-11-26: qty 2

## 2023-11-26 MED ORDER — POVIDONE-IODINE 10 % EX SWAB
2.0000 | Freq: Once | CUTANEOUS | Status: AC
  Administered 2023-11-26: 2 via TOPICAL

## 2023-11-26 MED ORDER — 0.9 % SODIUM CHLORIDE (POUR BTL) OPTIME
TOPICAL | Status: DC | PRN
  Administered 2023-11-26: 1000 mL

## 2023-11-26 MED ORDER — HYDROMORPHONE HCL 1 MG/ML IJ SOLN: 0.5000 mg | INTRAMUSCULAR | Status: DC | PRN

## 2023-11-26 MED ORDER — PROPOFOL 10 MG/ML IV BOLUS
INTRAVENOUS | Status: AC
  Filled 2023-11-26: qty 20

## 2023-11-26 MED ORDER — ONDANSETRON HCL 4 MG/2ML IJ SOLN
INTRAMUSCULAR | Status: AC
  Filled 2023-11-26: qty 2

## 2023-11-26 MED ORDER — TRANEXAMIC ACID-NACL 1000-0.7 MG/100ML-% IV SOLN
1000.0000 mg | INTRAVENOUS | Status: AC
  Administered 2023-11-26: 1000 mg via INTRAVENOUS
  Filled 2023-11-26: qty 100

## 2023-11-26 MED ORDER — KETOROLAC TROMETHAMINE 30 MG/ML IJ SOLN
INTRAMUSCULAR | Status: AC
  Filled 2023-11-26: qty 1

## 2023-11-26 MED ORDER — DEXAMETHASONE SODIUM PHOSPHATE 10 MG/ML IJ SOLN
INTRAMUSCULAR | Status: DC | PRN
  Administered 2023-11-26: 4 mg via INTRAVENOUS

## 2023-11-26 MED ORDER — LIDOCAINE HCL (CARDIAC) PF 100 MG/5ML IV SOSY
PREFILLED_SYRINGE | INTRAVENOUS | Status: DC | PRN
  Administered 2023-11-26: 50 mg via INTRAVENOUS

## 2023-11-26 MED ORDER — DEXAMETHASONE SODIUM PHOSPHATE 10 MG/ML IJ SOLN
10.0000 mg | Freq: Once | INTRAMUSCULAR | Status: AC
  Administered 2023-11-27: 10 mg via INTRAVENOUS
  Filled 2023-11-26: qty 1

## 2023-11-26 MED ORDER — LIDOCAINE HCL (PF) 2 % IJ SOLN
INTRAMUSCULAR | Status: AC
Start: 2023-11-26 — End: 2023-11-26
  Filled 2023-11-26: qty 5

## 2023-11-26 MED ORDER — POLYETHYLENE GLYCOL 3350 17 G PO PACK
17.0000 g | PACK | Freq: Two times a day (BID) | ORAL | Status: DC
  Administered 2023-11-29 (×2): 17 g via ORAL
  Filled 2023-11-26 (×6): qty 1

## 2023-11-26 MED ORDER — SENNA 8.6 MG PO TABS
2.0000 | ORAL_TABLET | Freq: Every day | ORAL | Status: DC
  Administered 2023-11-29 – 2023-11-30 (×2): 17.2 mg via ORAL
  Filled 2023-11-26 (×4): qty 2

## 2023-11-26 MED ORDER — ACETAMINOPHEN 500 MG PO TABS
1000.0000 mg | ORAL_TABLET | Freq: Four times a day (QID) | ORAL | Status: AC
  Administered 2023-11-26 – 2023-11-30 (×10): 1000 mg via ORAL
  Filled 2023-11-26 (×11): qty 2

## 2023-11-26 MED ORDER — VANCOMYCIN HCL 1000 MG IV SOLR
INTRAVENOUS | Status: AC
  Filled 2023-11-26: qty 20

## 2023-11-26 MED ORDER — ONDANSETRON HCL 4 MG/2ML IJ SOLN
INTRAMUSCULAR | Status: DC | PRN
  Administered 2023-11-26: 4 mg via INTRAVENOUS

## 2023-11-26 MED ORDER — DEXAMETHASONE SODIUM PHOSPHATE 10 MG/ML IJ SOLN
INTRAMUSCULAR | Status: AC
  Filled 2023-11-26: qty 1

## 2023-11-26 MED ORDER — AMISULPRIDE (ANTIEMETIC) 5 MG/2ML IV SOLN: 10.0000 mg | Freq: Once | INTRAVENOUS | Status: DC | PRN

## 2023-11-26 MED ORDER — METOCLOPRAMIDE HCL 5 MG PO TABS: 5.0000 mg | ORAL_TABLET | Freq: Three times a day (TID) | ORAL | Status: DC | PRN

## 2023-11-26 MED ORDER — METHOCARBAMOL 1000 MG/10ML IJ SOLN: 500.0000 mg | Freq: Four times a day (QID) | INTRAMUSCULAR | Status: DC | PRN

## 2023-11-26 MED ORDER — ONDANSETRON HCL 4 MG/2ML IJ SOLN
4.0000 mg | Freq: Four times a day (QID) | INTRAMUSCULAR | Status: DC | PRN
Start: 2023-11-26 — End: 2023-12-01
  Administered 2023-11-26 – 2023-11-27 (×2): 4 mg via INTRAVENOUS
  Filled 2023-11-26 (×2): qty 2

## 2023-11-26 MED ORDER — BISACODYL 10 MG RE SUPP: 10.0000 mg | Freq: Every day | RECTAL | Status: DC | PRN

## 2023-11-26 MED ORDER — PRONTOSAN WOUND IRRIGATION OPTIME
TOPICAL | Status: DC | PRN
  Administered 2023-11-26: 1 via TOPICAL

## 2023-11-26 MED ORDER — OXYCODONE HCL 5 MG PO TABS: 5.0000 mg | ORAL_TABLET | ORAL | Status: DC | PRN

## 2023-11-26 MED ORDER — FENTANYL CITRATE PF 50 MCG/ML IJ SOSY: 25.0000 ug | PREFILLED_SYRINGE | INTRAMUSCULAR | Status: DC | PRN

## 2023-11-26 MED ORDER — PROPOFOL 10 MG/ML IV BOLUS
INTRAVENOUS | Status: DC | PRN
  Administered 2023-11-26: 200 mg via INTRAVENOUS

## 2023-11-26 MED ORDER — FENTANYL CITRATE (PF) 100 MCG/2ML IJ SOLN
INTRAMUSCULAR | Status: AC
  Filled 2023-11-26: qty 2

## 2023-11-26 MED ORDER — METHOCARBAMOL 500 MG PO TABS: 500.0000 mg | ORAL_TABLET | Freq: Four times a day (QID) | ORAL | Status: DC | PRN

## 2023-11-26 MED ORDER — PHENYLEPHRINE HCL (PRESSORS) 10 MG/ML IV SOLN
INTRAVENOUS | Status: DC | PRN
Start: 2023-11-26 — End: 2023-11-26
  Administered 2023-11-26 (×4): 80 ug via INTRAVENOUS

## 2023-11-26 MED ORDER — TRANEXAMIC ACID-NACL 1000-0.7 MG/100ML-% IV SOLN
1000.0000 mg | Freq: Once | INTRAVENOUS | Status: AC
  Administered 2023-11-26: 1000 mg via INTRAVENOUS
  Filled 2023-11-26: qty 100

## 2023-11-26 MED ORDER — ONDANSETRON HCL 4 MG/2ML IJ SOLN: 4.0000 mg | Freq: Once | INTRAMUSCULAR | Status: DC | PRN

## 2023-11-26 MED ORDER — VANCOMYCIN HCL 1000 MG IV SOLR
INTRAVENOUS | Status: DC | PRN
Start: 2023-11-26 — End: 2023-11-26
  Administered 2023-11-26: 1000 mg via TOPICAL

## 2023-11-26 MED ORDER — METOCLOPRAMIDE HCL 5 MG/ML IJ SOLN
5.0000 mg | Freq: Three times a day (TID) | INTRAMUSCULAR | Status: DC | PRN
  Administered 2023-11-27: 10 mg via INTRAVENOUS
  Filled 2023-11-26: qty 2

## 2023-11-26 MED ORDER — PHENOL 1.4 % MT LIQD: 1.0000 | OROMUCOSAL | Status: DC | PRN

## 2023-11-26 MED ORDER — ACETAMINOPHEN 10 MG/ML IV SOLN: 1000.0000 mg | Freq: Once | INTRAVENOUS | Status: DC | PRN

## 2023-11-26 MED ORDER — SODIUM CHLORIDE (PF) 0.9 % IJ SOLN
INTRAMUSCULAR | Status: AC
  Filled 2023-11-26: qty 30

## 2023-11-26 MED ORDER — SODIUM CHLORIDE 0.9 % IR SOLN
Status: DC | PRN
  Administered 2023-11-26: 3000 mL

## 2023-11-26 MED ORDER — LACTATED RINGERS IV SOLN: INTRAVENOUS | Status: DC

## 2023-11-26 MED ORDER — CHLORHEXIDINE GLUCONATE 0.12 % MT SOLN
15.0000 mL | Freq: Once | OROMUCOSAL | Status: AC
  Administered 2023-11-26: 15 mL via OROMUCOSAL

## 2023-11-26 MED ORDER — SURGIPHOR WOUND IRRIGATION SYSTEM - OPTIME: TOPICAL | Status: DC | PRN

## 2023-11-26 SURGICAL SUPPLY — 45 items
BAG COUNTER SPONGE SURGICOUNT (BAG) IMPLANT
BAG ZIPLOCK 12X15 (MISCELLANEOUS) ×2 IMPLANT
BLADE SAW SGTL 11.0X1.19X90.0M (BLADE) IMPLANT
BNDG ELASTIC 6INX 5YD STR LF (GAUZE/BANDAGES/DRESSINGS) ×2 IMPLANT
BNDG ELASTIC 6X10 VLCR STRL LF (GAUZE/BANDAGES/DRESSINGS) IMPLANT
COVER SURGICAL LIGHT HANDLE (MISCELLANEOUS) ×2 IMPLANT
CUFF TRNQT CYL 34X4.125X (TOURNIQUET CUFF) ×2 IMPLANT
DERMABOND ADVANCED .7 DNX12 (GAUZE/BANDAGES/DRESSINGS) IMPLANT
DRAPE SHEET LG 3/4 BI-LAMINATE (DRAPES) ×2 IMPLANT
DRSG ADAPTIC 3X8 NADH LF (GAUZE/BANDAGES/DRESSINGS) IMPLANT
DRSG AQUACEL AG ADV 3.5X10 (GAUZE/BANDAGES/DRESSINGS) IMPLANT
DRSG AQUACEL AG ADV 3.5X14 (GAUZE/BANDAGES/DRESSINGS) ×2 IMPLANT
DRSG TEGADERM 4X4.75 (GAUZE/BANDAGES/DRESSINGS) IMPLANT
DURAPREP 26ML APPLICATOR (WOUND CARE) ×4 IMPLANT
ELECT REM PT RETURN 15FT ADLT (MISCELLANEOUS) ×2 IMPLANT
EVACUATOR 1/8 PVC DRAIN (DRAIN) IMPLANT
GAUZE PAD ABD 8X10 STRL (GAUZE/BANDAGES/DRESSINGS) IMPLANT
GAUZE SPONGE 2X2 8PLY STRL LF (GAUZE/BANDAGES/DRESSINGS) IMPLANT
GAUZE SPONGE 4X4 12PLY STRL (GAUZE/BANDAGES/DRESSINGS) IMPLANT
GLOVE BIO SURGEON STRL SZ 6 (GLOVE) ×4 IMPLANT
GLOVE BIOGEL PI IND STRL 6.5 (GLOVE) ×2 IMPLANT
GLOVE BIOGEL PI IND STRL 7.5 (GLOVE) ×2 IMPLANT
GLOVE ORTHO TXT STRL SZ7.5 (GLOVE) ×4 IMPLANT
GOWN STRL REUS W/ TWL LRG LVL3 (GOWN DISPOSABLE) ×4 IMPLANT
KIT TURNOVER KIT A (KITS) ×2 IMPLANT
MANIFOLD NEPTUNE II (INSTRUMENTS) ×2 IMPLANT
NDL SAFETY ECLIPSE 18X1.5 (NEEDLE) ×2 IMPLANT
PACK TOTAL KNEE CUSTOM (KITS) ×2 IMPLANT
PADDING CAST COTTON 6X4 STRL (CAST SUPPLIES) IMPLANT
PENCIL SMOKE EVACUATOR (MISCELLANEOUS) ×2 IMPLANT
PROTECTOR NERVE ULNAR (MISCELLANEOUS) ×2 IMPLANT
SET HNDPC FAN SPRY TIP SCT (DISPOSABLE) ×2 IMPLANT
SET PAD KNEE POSITIONER (MISCELLANEOUS) ×2 IMPLANT
SOL .9 NS 3000ML IRR UROMATIC (IV SOLUTION) IMPLANT
SOLUTION IRRIG SURGIPHOR (IV SOLUTION) IMPLANT
SOLUTION PRONTOSAN WOUND 350ML (IRRIGATION / IRRIGATOR) IMPLANT
STAPLER SKIN PROX 35W (STAPLE) IMPLANT
SUT MNCRL AB 4-0 PS2 18 (SUTURE) ×2 IMPLANT
SUT STRATAFIX PDS+ 0 24IN (SUTURE) ×2 IMPLANT
SUT VIC AB 1 CT1 36 (SUTURE) ×4 IMPLANT
SUT VIC AB 2-0 CT1 TAPERPNT 27 (SUTURE) ×6 IMPLANT
SWAB COLLECTION DEVICE MRSA (MISCELLANEOUS) ×2 IMPLANT
SWAB CULTURE ESWAB REG 1ML (MISCELLANEOUS) ×2 IMPLANT
SYR 3ML LL SCALE MARK (SYRINGE) ×2 IMPLANT
WRAP KNEE MAXI GEL POST OP (GAUZE/BANDAGES/DRESSINGS) ×2 IMPLANT

## 2023-11-26 NOTE — Progress Notes (Signed)
 Patient ID: [name], male   DOB: 06-25-1950, 73 y.o.   MRN: [id number] Subjective:       Patient reports pain as mild to moderate, some improvement since onset of symptoms  Objective:   VITALS:   Vitals:   11/26/23 0008 11/26/23 0451  BP: 135/69 134/76  Pulse: 81 78  Resp: 17 18  Temp: 98.8 F (37.1 C) 98.3 F (36.8 C)  SpO2: 95% 94%    Neurovascular intact Incision: dressing C/D/I Left knee and lower extremity exam: Significant prepatellar swelling associated with erythema just proximal to the patella to his lower leg associated with 2+ lower extremity edema Noted following aspiration procedure no palpable joint effusion Some limitation with knee flexion but significantly improved after aspiration  LABS Recent Labs    11/25/23 1840  HGB 12.2*  HCT 37.8*  WBC 11.0*  PLT 124*    Recent Labs    11/25/23 1840  NA 132*  K 4.9  BUN 10  CREATININE 0.66  GLUCOSE 114*    No results for input(s): LABPT, INR in the last 72 hours.   Assessment/Plan: 1.  History of revision left total knee arthroplasty 2.  Acute presentation of prepatellar swelling associated with cellulitic response, left knee septic prepatellar bursitis  Plan: [name] had been seen and evaluated in the office on Friday 8/22 for routine follow-up of his left knee revision.  At the time of that evaluation he was doing very well without swelling or erythema in his left leg.  After that visit he was doing his normal activities including cycling 30 miles and began feeling feverish and malaise on Sunday afternoon.  Based on this he was seen and evaluated in the office yesterday and noted to have significant lower extremity erythema and swelling in his prepatellar space.  Based on his general condition we admitted him to the hospital.  Today upon evaluation I did perform an aspiration.  At the time of his aspiration I removed approximately 100 cc of purulent/inflammatory based fluid.  This  represented significant decompression of the space.  Following the aspiration I was unable to palpate any ballotable knee effusion.  I sent this fluid for Gram stain and culture.  Upon admission we had placed him on Ancef  2 g IV every 8 hours.  Based on these findings from aspiration I will plan to take him to the operating room to perform an I&D of the prepatellar space.  There is a lot at risk based on his history of revision knee arthroplasty and I want to be as aggressive as possible and addressing this appearance of the septic prepatellar bursitis.  Orders placed for surgery.

## 2023-11-26 NOTE — Transfer of Care (Signed)
 Immediate Anesthesia Transfer of Care Note  Patient: [name]  Procedure(s) Performed: IRRIGATION AND DEBRIDEMENT KNEE (Left: Knee)  Patient Location: PACU  Anesthesia Type:General  Level of Consciousness: awake, alert , oriented, and patient cooperative  Airway & Oxygen Therapy: Patient Spontanous Breathing and Patient connected to face mask oxygen  Post-op Assessment: Report given to RN and Post -op Vital signs reviewed and stable  Post vital signs: Reviewed and stable  Last Vitals:  Vitals Value Taken Time  BP 108/60 11/26/23 13:05  Temp    Pulse 69 11/26/23 13:08  Resp 15 11/26/23 13:08  SpO2 99 % 11/26/23 13:08  Vitals shown include unfiled device data.  Last Pain:  Vitals:   11/26/23 1127  TempSrc:   PainSc: 0-No pain      Patients Stated Pain Goal: 1 (11/25/23 1647)  Complications: No notable events documented.

## 2023-11-26 NOTE — Discharge Instructions (Signed)
INSTRUCTIONS AFTER SURGERY  Remove items at home which could result in a fall. This includes throw rugs or furniture in walking pathways ICE to the affected joint every three hours while awake for 30 minutes at a time, for at least the first 3-5 days, and then as needed for pain and swelling.  Continue to use ice for pain and swelling. You may notice swelling that will progress down to the foot and ankle.  This is normal after surgery.  Elevate your leg when you are not up walking on it.   Continue to use the breathing machine you got in the hospital (incentive spirometer) which will help keep your temperature down.  It is common for your temperature to cycle up and down following surgery, especially at night when you are not up moving around and exerting yourself.  The breathing machine keeps your lungs expanded and your temperature down.   DIET:  As you were doing prior to hospitalization, we recommend a well-balanced diet.  DRESSING / WOUND CARE / SHOWERING  Keep the surgical dressing until follow up.  The dressing is water proof, so you can shower without any extra covering.  IF THE DRESSING FALLS OFF or the wound gets wet inside, change the dressing with sterile gauze.  Please use good hand washing techniques before changing the dressing.  Do not use any lotions or creams on the incision until instructed by your surgeon.    ACTIVITY  Increase activity slowly as tolerated, but follow the weight bearing instructions below.   No driving for 6 weeks or until further direction given by your physician.  You cannot drive while taking narcotics.  No lifting or carrying greater than 10 lbs. until further directed by your surgeon. Avoid periods of inactivity such as sitting longer than an hour when not asleep. This helps prevent blood clots.  You may return to work once you are authorized by your doctor.     WEIGHT BEARING   Weight bearing as tolerated with assist device (walker, cane, etc) as  directed, use it as long as suggested by your surgeon or therapist, typically at least 4-6 weeks.  CONSTIPATION  Constipation is defined medically as fewer than three stools per week and severe constipation as less than one stool per week.  Even if you have a regular bowel pattern at home, your normal regimen is likely to be disrupted due to multiple reasons following surgery.  Combination of anesthesia, postoperative narcotics, change in appetite and fluid intake all can affect your bowels.   YOU MUST use at least one of the following options; they are listed in order of increasing strength to get the job done.  They are all available over the counter, and you may need to use some, POSSIBLY even all of these options:    Drink plenty of fluids (prune juice may be helpful) and high fiber foods Colace 100 mg by mouth twice a day  Senokot for constipation as directed and as needed Dulcolax (bisacodyl), take with full glass of water  Miralax (polyethylene glycol) once or twice a day as needed.  If you have tried all these things and are unable to have a bowel movement in the first 3-4 days after surgery call either your surgeon or your primary doctor.    If you experience loose stools or diarrhea, hold the medications until you stool forms back up.  If your symptoms do not get better within 1 week or if they get worse, check with  your doctor.  If you experience "the worst abdominal pain ever" or develop nausea or vomiting, please contact the office immediately for further recommendations for treatment.   ITCHING:  If you experience itching with your medications, try taking only a single pain pill, or even half a pain pill at a time.  You can also use Benadryl over the counter for itching or also to help with sleep.   TED HOSE STOCKINGS:  Use stockings on both legs until for at least 2 weeks or as directed by physician office. They may be removed at night for sleeping.  MEDICATIONS:  See your  medication summary on the "After Visit Summary" that nursing will review with you.  You may have some home medications which will be placed on hold until you complete the course of blood thinner medication.  It is important for you to complete the blood thinner medication as prescribed.  PRECAUTIONS:  If you experience chest pain or shortness of breath - call 911 immediately for transfer to the hospital emergency department.   If you develop a fever greater that 101 F, purulent drainage from wound, increased redness or drainage from wound, foul odor from the wound/dressing, or calf pain - CONTACT YOUR SURGEON.                                                   FOLLOW-UP APPOINTMENTS:  If you do not already have a post-op appointment, please call the office for an appointment to be seen by your surgeon.  Guidelines for how soon to be seen are listed in your "After Visit Summary", but are typically between 1-4 weeks after surgery.  POST-OPERATIVE OPIOID TAPER INSTRUCTIONS: It is important to wean off of your opioid medication as soon as possible. If you do not need pain medication after your surgery it is ok to stop day one. Opioids include: Codeine, Hydrocodone(Norco, Vicodin), Oxycodone(Percocet, oxycontin) and hydromorphone amongst others.  Long term and even short term use of opiods can cause: Increased pain response Dependence Constipation Depression Respiratory depression And more.  Withdrawal symptoms can include Flu like symptoms Nausea, vomiting And more Techniques to manage these symptoms Hydrate well Eat regular healthy meals Stay active Use relaxation techniques(deep breathing, meditating, yoga) Do Not substitute Alcohol to help with tapering If you have been on opioids for less than two weeks and do not have pain than it is ok to stop all together.  Plan to wean off of opioids This plan should start within one week post op of your joint replacement. Maintain the same  interval or time between taking each dose and first decrease the dose.  Cut the total daily intake of opioids by one tablet each day Next start to increase the time between doses. The last dose that should be eliminated is the evening dose.   MAKE SURE YOU:  Understand these instructions.  Get help right away if you are not doing well or get worse.    Thank you for letting us be a part of your medical care team.  It is a privilege we respect greatly.  We hope these instructions will help you stay on track for a fast and full recovery!

## 2023-11-26 NOTE — Anesthesia Procedure Notes (Signed)
 Procedure Name: LMA Insertion Date/Time: 11/26/2023 12:16 PM  Performed by: Kathern Rollene LABOR, CRNAPre-anesthesia Checklist: Patient identified, Emergency Drugs available, Suction available and Patient being monitored Patient Re-evaluated:Patient Re-evaluated prior to induction Oxygen Delivery Method: Circle system utilized Preoxygenation: Pre-oxygenation with 100% oxygen Induction Type: IV induction Ventilation: Mask ventilation without difficulty LMA: LMA with gastric port inserted LMA Size: 4.0 Tube type: Oral Number of attempts: 1 Placement Confirmation: positive ETCO2 and breath sounds checked- equal and bilateral Tube secured with: Tape Dental Injury: Teeth and Oropharynx as per pre-operative assessment

## 2023-11-26 NOTE — Interval H&P Note (Signed)
 History and Physical Interval Note:  11/26/2023 11:59 AM  [name]  has presented today for surgery, with the diagnosis of Left knee septic prepatellar bursitis.  The various methods of treatment have been discussed with the patient and family. After consideration of risks, benefits and other options for treatment, the patient has consented to  Procedure(s): IRRIGATION AND DEBRIDEMENT KNEE (Left) as a surgical intervention.  The patient's history has been reviewed, patient examined, no change in status, stable for surgery.  I have reviewed the patient's chart and labs.  Questions were answered to the patient's satisfaction.     [name]

## 2023-11-26 NOTE — Progress Notes (Signed)
   11/26/23 0207  BiPAP/CPAP/SIPAP  $ Non-Invasive Home Ventilator  Initial  BiPAP/CPAP/SIPAP Pt Type Adult  BiPAP/CPAP/SIPAP DREAMSTATIOND  Mask Type Nasal mask  Dentures removed? Not applicable  Respiratory Rate 16 breaths/min  FiO2 (%) 21 %  Patient Home Machine No  Patient Home Mask Yes  Patient Home Tubing No  Auto Titrate Yes  Minimum cmH2O 10 cmH2O  Maximum cmH2O 20 cmH2O  Device Plugged into RED Power Outlet Yes

## 2023-11-26 NOTE — H&P (Signed)
 [name] is an 73 y.o. male.   Chief Complaint:  Left leg redness HPI:   [name] is well known to myself and Dr. Ernie and has been a patient with us  for several years. He has a history of left TKA revision and subbsequent scar excision due to clunk in May 2025. He was seen in our office on Friday 8/22 for 3 month post op visit, and was doing very well at that time. He had no erythematous changes to his knee and had been very active (normally bikes 30+ miles/day several days per week) without pain or issues. He then developed fever/chills/malaise as well as lower leg redness on Sunday 8/24. He has been seeing a wound clinic for a RLE wound which is healing well, but showed them his leg and was placed on keflex which he began on Tuesday. He then called our office and we brought him in.   In office yesterday, he had significant lower extremity erythema which traveled across his entire anterior knee. He was noted to have a large prepatellar effusion, without joint effusion. Based on this finding and our concerns about his implant, as well as how sick he appeared we decided to direct admit him.   This morning, Dr. Olin saw him as detailed in his note. Aspiration resulted in purulent fluid, and we placed him on the schedule for I&D today.  Past Medical History:  Diagnosis Date   Allergy    Anxiety    Arthritis    knees   Asthma    Depression    Dysrhythmia    RBBB   Elevated PSA    us and bx negative 2011   Hypertension    nl cardiolite in 1998   Pneumonia 2005   right hospitalized    Rhinosinusitis    Sleep apnea    wears CPAP    Past Surgical History:  Procedure Laterality Date   COLONOSCOPY  03/09/2013   ECTROPION REPAIR Left    2023   EYE SURGERY Left 04/08/2022   to bring lower eyelid up   FACIAL RECONSTRUCTION SURGERY     1992   HERNIA REPAIR     KNEE ARTHROTOMY Left 08/18/2023   Procedure: ARTHROTOMY, KNEE;  Surgeon: Olin, Matthew, MD;  Location: [facility];  Service:  Orthopedics;  Laterality: Left;  Left knee open scar debridment   NASAL SINUS SURGERY     REPLACEMENT TOTAL KNEE  01/2010   left; Dr Olin; RIght knee 06-2010   REVERSE SHOULDER ARTHROPLASTY Right 03/14/2022   Procedure: REVERSE SHOULDER ARTHROPLASTY WITH REMOVAL OF ANATOMIC ARTHROPLASTY;  Surgeon: Supple, Kevin, MD;  Location: [facility];  Service: Orthopedics;  Laterality: Right;   right shoulder arthroplasty     20 18   shoulder atrhroscopic surgery  2011   Right   TOTAL KNEE REVISION Left 12/05/2021   Procedure: TOTAL KNEE REVISION;  Surgeon: Ernie Cough, MD;  Location: [facility];  Service: Orthopedics;  Laterality: Left;   TOTAL SHOULDER REVISION Right 05/09/2022   Procedure: Revision Right Reverse Shoulder Arthroplasty;  Surgeon: Melita Drivers, MD;  Location: [facility];  Service: Orthopedics;  Laterality: Right;    VENTRAL HERNIA REPAIR  03/2007   rerepair winter 2010   umbilical per pt    Family History  Problem Relation Age of Onset   Heart failure Father        MV replacement   Myelodysplastic syndrome Father    Asthma Mother    Polycythemia Brother  Asthma Other    Hypertension Other    Diabetes Maternal Grandmother    Healthy Sister    Healthy Brother    Healthy Brother    Colon cancer Neg Hx    Pancreatic cancer Neg Hx    Stomach cancer Neg Hx    Esophageal cancer Neg Hx    Colon polyps Neg Hx    Rectal cancer Neg Hx    Social History:  reports that he has never smoked. He has never used smokeless tobacco. He reports that he does not drink alcohol  and does not use drugs.  Allergies:  Allergies  Allergen Reactions   Tape Other (See Comments)    Causes bleeding and redness of the skin.SABRA SKIN IS VERY THIN!!   Egg-Derived Products Diarrhea, Nausea Only and Other (See Comments)    Terrible stomach pain    Medications Prior to Admission  Medication Sig Dispense Refill   amLODipine -benazepril  (LOTREL) 5-10 MG capsule TAKE 1 CAPSULE BY MOUTH EVERY DAY (Patient  taking differently: Take 1 capsule by mouth at bedtime.) 90 capsule 2   Artificial Tear Solution (SOOTHE XP) SOLN Place 1 drop into both eyes 3 (three) times daily as needed (for dryness).     aspirin  EC 81 MG tablet Take 81 mg by mouth daily. Swallow whole.     cephALEXin (KEFLEX) 500 MG capsule Take 500 mg by mouth 4 (four) times daily.     clobetasol (TEMOVATE) 0.05 % external solution Apply 1 Application topically 2 (two) times daily as needed (for itching- as directed).     escitalopram  (LEXAPRO ) 10 MG tablet Take 1 tablet (10 mg total) by mouth daily. (Patient taking differently: Take 10 mg by mouth at bedtime.) 90 tablet 2   fexofenadine (ALLEGRA) 180 MG tablet Take 180 mg by mouth at bedtime.     fluticasone  (FLONASE ) 50 MCG/ACT nasal spray SPRAY 2 SPRAYS INTO EACH NOSTRIL EVERY DAY (Patient taking differently: Place 1-2 sprays into both nostrils in the morning and at bedtime.) 48 mL 2   ibuprofen  (ADVIL ) 800 MG tablet Take 800 mg by mouth in the morning and at bedtime.     Ipratropium-Albuterol  (COMBIVENT  RESPIMAT) 20-100 MCG/ACT AERS respimat Inhale 1 puff up to four times daily as needed (Patient taking differently: Inhale 1 puff into the lungs every 6 (six) hours as needed for wheezing or shortness of breath.) 4 g 11   Olopatadine  HCl (PATADAY ) 0.7 % SOLN Place 1 drop into both eyes daily as needed (for allergies).     tadalafil (CIALIS) 5 MG tablet Take 5 mg by mouth at bedtime.     WIXELA INHUB 100-50 MCG/ACT AEPB TAKE 1 PUFF BY MOUTH TWICE A DAY (Patient taking differently: Inhale 1 puff into the lungs in the morning and at bedtime.) 60 each 4    Results for orders placed or performed during the hospital encounter of 11/25/23 (from the past 48 hours)  CBC     Status: Abnormal   Collection Time: 11/25/23  6:40 PM  Result Value Ref Range   WBC 11.0 (H) 4.0 - 10.5 K/uL   RBC 3.55 (L) 4.22 - 5.81 MIL/uL   Hemoglobin 12.2 (L) 13.0 - 17.0 g/dL   HCT 62.1 (L) 60.9 - 47.9 %   MCV 106.5  (H) 80.0 - 100.0 fL   MCH 34.4 (H) 26.0 - 34.0 pg   MCHC 32.3 30.0 - 36.0 g/dL   RDW 85.4 88.4 - 84.4 %   Platelets 124 (L) 150 - 400  K/uL   nRBC 0.0 0.0 - 0.2 %    Comment: Performed at [facility], [address], [place], [place] [address]  Basic metabolic panel     Status: Abnormal   Collection Time: 11/25/23  6:40 PM  Result Value Ref Range   Sodium 132 (L) 135 - 145 mmol/L   Potassium 4.9 3.5 - 5.1 mmol/L   Chloride 98 98 - 111 mmol/L   CO2 23 22 - 32 mmol/L   Glucose, Bld 114 (H) 70 - 99 mg/dL    Comment: Glucose reference range applies only to samples taken after fasting for at least 8 hours.   BUN 10 8 - 23 mg/dL   Creatinine, Ser 9.33 0.61 - 1.24 mg/dL   Calcium 9.3 8.9 - 89.6 mg/dL   GFR, Estimated >39 >39 mL/min    Comment: (NOTE) Calculated using the CKD-EPI Creatinine Equation (2021)    Anion gap 12 5 - 15    Comment: Performed at [facility], [address], [place], [place] [address]   No results found.  Review of Systems  Constitutional:  Positive for chills, fatigue and fever.     Blood pressure 134/76, pulse 78, temperature 98.3 F (36.8 C), resp. rate 18, height 5' 4 (1.626 m), weight 91 kg, SpO2 94%. Physical Exam   Left Leg: Knee incision intact Significant prepatellar effusion, without palpable joint effusion Erythema extending from the foot across the knee Able to tolerate motion of his knee without significant pain  Assessment/Plan  Left leg cellulitis, now with purulent prepatellar effusion consistent with superficial knee infection History of left TKA revision   Dr. Ernie saw the patient this morning and aspirated his prepatellar space. This was sent for gram stain and culture. We will place him on the schedule for superficial/prepatellar I&D today. NPO now.  We started him yesterday on IV ancef . He did have a rash on his trunk prior to arrival which we will monitor, although he has previously  tolerated multiple cephalosporins (cefadroxil, ancef )    [name] JONELLE Calin, PA-C 11/26/2023, 7:31 AM

## 2023-11-26 NOTE — Brief Op Note (Signed)
 11/25/2023 - 11/26/2023  11:59 AM  PATIENT:  [name]  73 y.o. male  PRE-OPERATIVE DIAGNOSIS:  Left knee septic prepatellar bursitis  POST-OPERATIVE DIAGNOSIS:  Left knee septic prepatellar bursitis  PROCEDURE:  Procedure(s): IRRIGATION AND DEBRIDEMENT KNEE (Left)  SURGEON:  Surgeons and Role:    DEWAINE Ernie Cough, MD - Primary  PHYSICIAN ASSISTANT: Rosina Calin  ANESTHESIA:   general  EBL:  <20 cc  BLOOD ADMINISTERED:none  DRAINS: none   LOCAL MEDICATIONS USED:  OTHER 1 gm of Vancomycin   SPECIMEN:  No Specimen  DISPOSITION OF SPECIMEN:  N/A  COUNTS:  YES  TOURNIQUET:  15 min at 225 mmHg  DICTATION: .Other Dictation: Dictation Number [id number]  PLAN OF CARE: Admit to inpatient   PATIENT DISPOSITION:  PACU - hemodynamically stable.   Delay start of Pharmacological VTE agent (>24hrs) due to surgical blood loss or risk of bleeding: no

## 2023-11-26 NOTE — Progress Notes (Signed)
 Patient ID: [name] Ihnen, male   DOB: [birth year], 73 y.o.   MRN: [id number]  Procedure note: After reviewing with [name] the condition of his left knee we discussed aspirating this prepatellar space.  Under a Betadine  prep laterally I aspirated approximately 100 cc of purulent appearing fluid.  This fluid was placed into a syringe and sent to pathology for Gram stain culture evaluation.  A Band-Aid was applied. There was significant decompression of this prepatellar swelling with no ballotable palpable knee effusion

## 2023-11-26 NOTE — TOC Initial Note (Signed)
 Transition of Care [facility]) - Initial/Assessment Note    Patient Details  Name: [name] MRN: [id number] Date of Birth: 1951-02-05  Transition of Care [facility]) CM/SW Contact:    NORMAN ASPEN, LCSW Phone Number: 11/26/2023, 10:43 AM  Clinical Narrative:                  Met with pt this morning to review anticipated dc needs.  Pt confirms that he lives alone with good friends and neighbors close by.  He describes an active lifestyle with biking and scuba diving so very frustrated with current situation.  He anticipates dc directly home.  Discussed need to await I&D procedure today, PT eval and possible ID.  IP CM will continue to follow for dc referral needs.    Expected Discharge Plan: Home w Home Health Services Barriers to Discharge: Continued Medical Work up   Patient Goals and CMS Choice Patient states their goals for this hospitalization and ongoing recovery are:: return home          Expected Discharge Plan and Services In-house Referral: Clinical Social Work   Post Acute Care Choice: Home Health Living arrangements for the past 2 months: Single Family Home                                      Prior Living Arrangements/Services Living arrangements for the past 2 months: Single Family Home Lives with:: Self Patient language and need for interpreter reviewed:: Yes Do you feel safe going back to the place where you live?: Yes      Need for Family Participation in Patient Care: No (Comment) Care giver support system in place?: No (comment)   Criminal Activity/Legal Involvement Pertinent to Current Situation/Hospitalization: No - Comment as needed  Activities of Daily Living   ADL Screening (condition at time of admission) Independently performs ADLs?: Yes (appropriate for developmental age) Is the patient deaf or have difficulty hearing?: No Does the patient have difficulty seeing, even when wearing glasses/contacts?: No Does the patient have difficulty  concentrating, remembering, or making decisions?: No  Permission Sought/Granted Permission sought to share information with : Family Supports Permission granted to share information with : Yes, Verbal Permission Granted  Share Information with NAME: sister, Consuelo Orem @ [phone]           Emotional Assessment Appearance:: Appears stated age Attitude/Demeanor/Rapport: Gracious, Engaged Affect (typically observed): Accepting Orientation: : Oriented to Self, Oriented to Place, Oriented to  Time, Oriented to Situation Alcohol  / Substance Use: Not Applicable Psych Involvement: No (comment)  Admission diagnosis:  Left Leg Cellulitis Patient Active Problem List   Diagnosis Date Noted   Arthrofibrosis of knee joint, left 08/18/2023   History of revision of total shoulder arthroplasty 05/09/2022   S/P revision of total knee, left 12/05/2021   Peripheral edema 09/17/2017   Eosinophilia 06/11/2016   Thrombocytopenia (HCC) 10/31/2014   Medication management 10/19/2013   Borderline anemia 10/19/2013   DJD (degenerative joint disease) 02/17/2013   Visit for preventive health examination 10/12/2011   Skin lesion of right leg 09/28/2010   Preventative health care 09/24/2010   KNEE REPLACEMENT, LEFT, HX OF 03/21/2010   OBESITY 09/17/2009   BUNDLE BRANCH BLOCK, RIGHT 09/17/2009   OSTEOARTHRITIS, KNEES, 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   SHOULDER IMPINGEMENT SYNDROME 03/14/2009   UNSPECIFIED THROMBOCYTOPENIA 12/12/2008   ANEMIA, MILD 09/22/2008   PROSTATE SPECIFIC ANTIGEN, ELEVATED 09/22/2008   KNEE PAIN,  BILATERAL 07/28/2008   DEPRESSION 02/09/2007   Obstructive sleep apnea 02/09/2007   Essential hypertension 02/09/2007   Asthma, mild intermittent, well-controlled 02/09/2007   Chronic insomnia 02/09/2007   PCP:  Charlett Apolinar POUR, MD Pharmacy:   [facility] [phone] - [place], [place] - [address] AT [address] [facility] [address] [place] [address] Phone:  [phone] Fax: [phone]     Social Drivers of Health (SDOH) Social History: SDOH Screenings   Food Insecurity: No Food Insecurity (11/25/2023)  Housing: Low Risk  (11/25/2023)  Transportation Needs: No Transportation Needs (11/25/2023)  Utilities: Not At Risk (11/25/2023)  Alcohol  Screen: Low Risk  (07/06/2023)  Depression (PHQ2-9): Low Risk  (10/14/2023)  Financial Resource Strain: Low Risk  (07/06/2023)  Physical Activity: Insufficiently Active (07/06/2023)  Social Connections: Moderately Isolated (11/25/2023)  Stress: No Stress Concern Present (07/06/2023)  Recent Concern: Stress - Stress Concern Present (06/16/2023)  Tobacco Use: Low Risk  (11/26/2023)  Health Literacy: Adequate Health Literacy (07/06/2023)   SDOH Interventions:     Readmission Risk Interventions     No data to display

## 2023-11-26 NOTE — Plan of Care (Signed)
  Problem: Education: Goal: Knowledge of General Education information will improve Description: Including pain rating scale, medication(s)/side effects and non-pharmacologic comfort measures Outcome: Progressing   Problem: Activity: Goal: Risk for activity intolerance will decrease Outcome: Progressing   Problem: Nutrition: Goal: Adequate nutrition will be maintained Outcome: Progressing   Problem: Coping: Goal: Level of anxiety will decrease Outcome: Progressing   Problem: Elimination: Goal: Will not experience complications related to urinary retention Outcome: Progressing   Problem: Pain Managment: Goal: General experience of comfort will improve and/or be controlled Outcome: Progressing
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
## 2023-11-26 NOTE — Anesthesia Preprocedure Evaluation (Addendum)
 Anesthesia Evaluation  Patient identified by MRN, date of birth, ID band Patient awake    Reviewed: Allergy  & Precautions, NPO status , Patient's Chart, lab work & pertinent test results  Airway Mallampati: II  TM Distance: >3 FB Neck ROM: Full    Dental  (+) Missing, Edentulous Upper   Pulmonary asthma , sleep apnea and Continuous Positive Airway Pressure Ventilation    Pulmonary exam normal        Cardiovascular hypertension, Pt. on medications Normal cardiovascular exam     Neuro/Psych  PSYCHIATRIC DISORDERS Anxiety Depression    negative neurological ROS     GI/Hepatic negative GI ROS, Neg liver ROS,,,  Endo/Other  negative endocrine ROS    Renal/GU negative Renal ROS     Musculoskeletal  (+) Arthritis ,    Abdominal   Peds  Hematology  (+) Blood dyscrasia, anemia   Anesthesia Other Findings Left knee septic prepatellar bursitis  Reproductive/Obstetrics                              Anesthesia Physical Anesthesia Plan  ASA: 3  Anesthesia Plan: General   Post-op Pain Management:    Induction: Intravenous  PONV Risk Score and Plan: 2 and Ondansetron , Dexamethasone  and Treatment may vary due to age or medical condition  Airway Management Planned: LMA  Additional Equipment:   Intra-op Plan:   Post-operative Plan: Extubation in OR  Informed Consent: I have reviewed the patients History and Physical, chart, labs and discussed the procedure including the risks, benefits and alternatives for the proposed anesthesia with the patient or authorized representative who has indicated his/her understanding and acceptance.     Dental advisory given  Plan Discussed with: CRNA  Anesthesia Plan Comments:         Anesthesia Quick Evaluation

## 2023-11-27 ENCOUNTER — Encounter (HOSPITAL_COMMUNITY): Payer: Self-pay | Admitting: Orthopedic Surgery

## 2023-11-27 DIAGNOSIS — B9561 Methicillin susceptible Staphylococcus aureus infection as the cause of diseases classified elsewhere: Secondary | ICD-10-CM | POA: Diagnosis not present

## 2023-11-27 DIAGNOSIS — R21 Rash and other nonspecific skin eruption: Secondary | ICD-10-CM | POA: Diagnosis not present

## 2023-11-27 DIAGNOSIS — M71162 Other infective bursitis, left knee: Principal | ICD-10-CM

## 2023-11-27 LAB — BASIC METABOLIC PANEL WITH GFR
Anion gap: 11 (ref 5–15)
BUN: 16 mg/dL (ref 8–23)
CO2: 22 mmol/L (ref 22–32)
Calcium: 9 mg/dL (ref 8.9–10.3)
Chloride: 99 mmol/L (ref 98–111)
Creatinine, Ser: 0.93 mg/dL (ref 0.61–1.24)
GFR, Estimated: >60 mL/min (ref >60)
Glucose, Bld: 182 mg/dL — ABNORMAL HIGH (ref 70–99)
Potassium: 4.8 mmol/L (ref 3.5–5.1)
Sodium: 132 mmol/L — ABNORMAL LOW (ref 135–145)

## 2023-11-27 LAB — CBC
HCT: 34.5 % — ABNORMAL LOW (ref 39.0–52.0)
Hemoglobin: 11.7 g/dL — ABNORMAL LOW (ref 13.0–17.0)
MCH: 36.1 pg — ABNORMAL HIGH (ref 26.0–34.0)
MCHC: 33.9 g/dL (ref 30.0–36.0)
MCV: 106.5 fL — ABNORMAL HIGH (ref 80.0–100.0)
Platelets: 146 K/uL — ABNORMAL LOW (ref 150–400)
RBC: 3.24 MIL/uL — ABNORMAL LOW (ref 4.22–5.81)
RDW: 14.4 % (ref 11.5–15.5)
WBC: 8.3 K/uL (ref 4.0–10.5)
nRBC: 0 % (ref 0.0–0.2)

## 2023-11-27 MED ORDER — DAPTOMYCIN-SODIUM CHLORIDE 700-0.9 MG/100ML-% IV SOLN
10.0000 mg/kg | Freq: Every day | INTRAVENOUS | Status: DC
  Administered 2023-11-27: 700 mg via INTRAVENOUS
  Filled 2023-11-27 (×2): qty 100

## 2023-11-27 NOTE — Op Note (Signed)
 NAME: [name], [name] MEDICAL RECORD NO: [id number] ACCOUNT NO: [id number] DATE OF BIRTH: 09-10-1950 FACILITY: [facility] LOCATION: [id number] PHYSICIAN: Donnice BIRCH. Ernie, MD  Operative Report   DATE OF PROCEDURE: 11/26/2023  PREOPERATIVE DIAGNOSIS:  Acute septic prepatellar bursitis.  POSTOPERATIVE DIAGNOSIS: Acute septic prepatellar bursitis.  PROCEDURE: 1. Excisional and non-excisional debridement of left knee prepatellar septic bursitis. 2. Excision and debridement was carried out sharply with the scalpel involving his incision, debriding sharply with the scalpel, subcutaneous scar tissue. 3. The excision length was approximately 3 inches and the depth was in the suprapatellar prepatellar space. 4.  Non-excisional debridement was carried out with 3 liters of normal saline solution as well as a bottle of Surgiphor iodine -based solution as well as Prontosan antimicrobial solution.  SURGEON:  Donnice BIRCH. Ernie, MD  ASSISTANT:  Rosina Calin, PA-C.  Note that PA Calin was present for the entirety of the case from preoperative positioning, preoperative management of the operative extremity, general facilitation of the case, and primary wound closure.  ANESTHESIA:  General.  BLOOD LOSS:  Minimal.  TOURNIQUET:  Up for 15 minutes at 225 mmHg.  INDICATIONS FOR THE PROCEDURE:  The patient is a very pleasant 73 year old male who has a history of revision total knee arthroplasty on the left.  Most recently, he had presented for findings of excessive parapatellar scarring or patella clunk and  underwent an open debridement of his knee in 07/2023.  He was seen in the office on 11/20/2023 and was noted to be doing very well.  He had no more mechanical symptoms.  On exam at the time, he had no erythema, warmth.  He had mild prepatellar swelling  but has resumed normal activities without any constitutional complaints or concerns.  Unfortunately, 2 days later, after a 30-mile bike ride, he began feeling  feverish and malaise and some pain with noted erythema in his left leg.  He was seen in the  office after being placed on Keflex.  At the time, he was noted to have low-grade fever, reporting fevers up to 102.  Based on his general appearance, he was admitted to the hospital on 11/25/2023 for a diagnosis of cellulitis and IV management.  He was  subsequently seen and evaluated on 11/26/2023 in the morning.  Based on his exam findings, I did aspirate his prepatellar space removing 100 mL of purulent fluid.  Based on these findings, I recommended that he go to the operating room for I and D of  this prepatellar space to try to prevent any complications within his joint.  There was no ballotable effusion within the joint itself after removal of the prepatellar swelling.  Risks of infection involving the joint discussed.  The critical nature  based on his history of revision surgery reviewed.  He was eager to proceed.  DESCRIPTION OF PROCEDURE:  The patient was brought to the operative theater.  Once adequate anesthesia and preoperative antibiotics were administered, he had been placed on 2 g of Ancef  every 8 hours since his admission.  He was positioned supine with a  left thigh tourniquet placed.  The left lower extremity was then prepped and draped in sterile fashion.  A timeout was performed identifying the patient, planned procedure and extremity.  I made a 3-inch incision over his prior incision.  Once we entered  the prepatellar space, there was a large amount of purulent fluid still remaining.  This was evacuated.  Once this was done, I used a combination of a scalpel and  a Bovie to perform an excisional debridement of the scar tissue in this region.  Once this  was done, I debrided further into the entire aspect of the incision and did not see evidence of any dehiscence of his retinacular tissues.  Once the excisional debridement was carried out, we irrigated the knee with normal saline solution, pulse  lavage  initially, and then Surgiphor iodine -based solution.  I allowed this to sit in the wound for approximately 4-5 minutes.  We then re-irrigated the knee with normal saline solution for a total of 3 liters.  I reapplied the Surgiphor and then applied  Prontosan for debridement chemically of this wound.  Once this was completed, I placed 1 g of vancomycin  in the wound.  We let the tourniquet down up to 15 minutes.  There was no significant hemostasis required.  The wound was then closed in layers with  2-0 Vicryl and a running Monocryl stitch.  The wound was cleaned, dried, and dressed sterilely using surgical glue and Aquacel dressing.  He was brought to the recovery room in stable condition and tolerated the procedure well.  Based on the appearance of his fluid, it appears that there is likely a Staph infection.  The aspiration that was done this morning had not yielded results at this time.  We will consult Infectious Disease for management due to concerns for potential  Staph epi and IV management in terms of joint preservation and prevention of further surgical intervention of his left knee.   PUS D: 11/26/2023 12:53:22 pm T: 11/27/2023 2:35:00 am  JOB: [id number]/ [id number]

## 2023-11-27 NOTE — Consult Note (Addendum)
 [facility]    Date of Admission:  11/25/2023   Total days of inpatient antibiotics 2        Reason for Consult: Prepatellar bursitis of left knee    Principal Problem:   Septic prepatellar bursitis of left knee Active Problems:   Prepatellar bursitis of left knee   Assessment:  73 year old male with past medical history of anxiety/depression, hypertension, OSA on CPAP, asthma admitted with #Prepatellar bursitis with Staph aureus in the setting of left TKA -Patient has history of left TKA revision, most recently for finding of excessive parapatellar scarring and patella clunk and underwent I&D of knee on 5/25.  He was seen in Ortho office on 8/22 with Dr. Ernie and noted's doing well at that time.  2 days later patient went for a 30 mile bike ride and noticed he was having chills and left knee was swollen.  He had a fever 102.  At that time he also noted that he had been on Keflex for right lower extremity wounds last dose on Tuesday.  Knee was aspirated on 8/28.  Aspiration showed purulent fluid.  Taken to the OR for excisional and nonexcisional debridement of left knee prepatellar septic bursitis, per my communication with orthopedics they did not aspirate or open the knee capsule, no evidence of deep infection.  No cultures obtained in the OR - ID engaged for antibiotic recommendation. #Rash on trunk with Keflex - Patient states that rash has now resolved.  Tolerating cefazolin  without any issues. - Previously trialed her cefadroxil/Ancef . Recommendations:  -Continue dapto and cefazolin  - Follow-up on aspirate cultures which are growing Staph aureus so far` -Place PICC, plan on 3 weeks antibiotics for septic bursitis -Standard precautions - Communicated with primary -Dr. Fleeta Rothman is covering the service this weekend, I will be back on service on Tuesday Microbiology:   Antibiotics: Cefazolin  8/27- Vancomycin  8/28-  Cultures:  Other 8/28-    HPI: [name] is a 73 y.o. male with past medical history of left TKA revision associated scar excision due to clotting in May 2025.  Seen in Ortho office with Dr. Ernie on 8/22 for 33-month postop visit.  He was doing well at that point.  Patient notes that he went up for a 30 mile bike ride towards the end of bike ride he noted he has having chills with knee swelling on Sunday 8/24.  He was seen Ortho office for significant lower extremity erythema.  He has been followed by wound care clinic for right lower extremity wounds placed on Keflex with last dose on Tuesday.  Knee was aspirated at bedside with purulent fluid.  Taken to the OR on 8/28 for excisional and nonexcisional debridement of left prepatellar septic bursitis.,  Depth noted to separate prepatellar space..  Per my communication with orthopedics they noted thatWe did not aspirate or open his knee capsule. No evidence of deep infection at this point. .  ID engaged for antibiotic recommendations.   Review of Systems: Review of Systems  All other systems reviewed and are negative.   Past Medical History:  Diagnosis Date   Allergy     Anxiety    Arthritis    knees   Asthma    Depression    Dysrhythmia    RBBB   Elevated PSA    us  and bx negative 2011   Hypertension    nl cardiolite in 1998   Pneumonia 2005   right  hospitalized    Rhinosinusitis    Sleep apnea    wears CPAP    Social History   Tobacco Use   Smoking status: Never   Smokeless tobacco: Never  Vaping Use   Vaping status: Never Used  Substance Use Topics   Alcohol  use: No    Comment: Quit drinking ETOH 1986   Drug use: No    Family History  Problem Relation Age of Onset   Heart failure Father        MV replacement   Myelodysplastic syndrome Father    Asthma Mother    Polycythemia Brother    Asthma Other    Hypertension Other    Diabetes Maternal Grandmother    Healthy Sister    Healthy Brother    Healthy Brother    Colon cancer Neg  Hx    Pancreatic cancer Neg Hx    Stomach cancer Neg Hx    Esophageal cancer Neg Hx    Colon polyps Neg Hx    Rectal cancer Neg Hx    Scheduled Meds:  acetaminophen   1,000 mg Oral Q6H   amLODipine   5 mg Oral Daily   And   benazepril   10 mg Oral Daily   aspirin   81 mg Oral BID   dexamethasone  (DECADRON ) injection  10 mg Intravenous Once   escitalopram   10 mg Oral Daily   fluticasone   2 spray Each Nare Daily   fluticasone  furoate-vilanterol  1 puff Inhalation Daily   loratadine   10 mg Oral Daily   olopatadine   1 drop Both Eyes Daily   polyethylene glycol  17 g Oral BID   senna  2 tablet Oral QHS   Continuous Infusions:  sodium chloride  75 mL/hr at 11/26/23 2118    ceFAZolin  (ANCEF ) IV 2 g (11/27/23 0534)   PRN Meds:.alum & mag hydroxide-simeth, artificial tears, bisacodyl , HYDROmorphone  (DILAUDID ) injection, hydrOXYzine , ipratropium-albuterol , menthol -cetylpyridinium **OR** phenol, methocarbamol  **OR** methocarbamol  (ROBAXIN ) injection, metoCLOPramide  **OR** metoCLOPramide  (REGLAN ) injection, ondansetron  **OR** ondansetron  (ZOFRAN ) IV, oxyCODONE  Allergies  Allergen Reactions   Tape Other (See Comments)    Causes bleeding and redness of the skin.SABRA SKIN IS VERY THIN!!   Egg-Derived Products Diarrhea, Nausea Only and Other (See Comments)    Terrible stomach pain    OBJECTIVE: Blood pressure (!) 122/58, pulse 74, temperature 98.3 F (36.8 C), temperature source Oral, resp. rate 16, height 5' 4 (1.626 m), weight 91 kg, SpO2 95%.  Physical Exam Constitutional:      General: He is not in acute distress.    Appearance: He is normal weight. He is not toxic-appearing.  HENT:     Head: Normocephalic and atraumatic.     Right Ear: External ear normal.     Left Ear: External ear normal.     Nose: No congestion or rhinorrhea.     Mouth/Throat:     Mouth: Mucous membranes are moist.     Pharynx: Oropharynx is clear.  Eyes:     Extraocular Movements: Extraocular movements intact.      Conjunctiva/sclera: Conjunctivae normal.     Pupils: Pupils are equal, round, and reactive to light.  Cardiovascular:     Rate and Rhythm: Normal rate and regular rhythm.     Heart sounds: No murmur heard.    No friction rub. No gallop.  Pulmonary:     Effort: Pulmonary effort is normal.     Breath sounds: Normal breath sounds.  Abdominal:     General: Abdomen is flat. Bowel sounds are normal.  Palpations: Abdomen is soft.  Musculoskeletal:        General: No swelling. Normal range of motion.     Cervical back: Normal range of motion and neck supple.  Skin:    General: Skin is warm and dry.  Neurological:     General: No focal deficit present.     Mental Status: He is oriented to person, place, and time.  Psychiatric:        Mood and Affect: Mood normal.     Lab Results Lab Results  Component Value Date   WBC 8.3 11/27/2023   HGB 11.7 (L) 11/27/2023   HCT 34.5 (L) 11/27/2023   MCV 106.5 (H) 11/27/2023   PLT 146 (L) 11/27/2023    Lab Results  Component Value Date   CREATININE 0.93 11/27/2023   BUN 16 11/27/2023   NA 132 (L) 11/27/2023   K 4.8 11/27/2023   CL 99 11/27/2023   CO2 22 11/27/2023    Lab Results  Component Value Date   ALT 18 02/11/2023   AST 19 02/11/2023   ALKPHOS 68 02/11/2023   BILITOT 0.9 02/11/2023       [name] Stank, MD [facility] 11/27/2023, 6:11 AM Evaluation of this patient requires complex antimicrobial therapy evaluation and counseling + isolation needs for disease transmission risk assessment and mitigation

## 2023-11-27 NOTE — Evaluation (Signed)
 Physical Therapy Evaluation Patient Details Name: [name] MRN: [id number] DOB: 1950/09/07 Today's Date: 11/27/2023  History of Present Illness  Pt s/p I&D of L knee 2* septic prepatellar bursitis.  Pt with hx of Bil TKR and R TSR  Clinical Impression  Pt admitted as above and presenting with functional mobility limitations 2* decreased L LE strength/ROM and mild balance deficits.  Pt currently denies pain.  Pt should progress to dc home with PRN family assist.      If plan is discharge home, recommend the following: A little help with walking and/or transfers;A little help with bathing/dressing/bathroom;Assistance with cooking/housework;Assist for transportation;Help with stairs or ramp for entrance   Can travel by private vehicle        Equipment Recommendations None recommended by PT  Recommendations for Other Services       Functional Status Assessment Patient has had a recent decline in their functional status and demonstrates the ability to make significant improvements in function in a reasonable and predictable amount of time.     Precautions / Restrictions Precautions Precautions: Fall Restrictions Weight Bearing Restrictions Per Provider Order: No      Mobility  Bed Mobility Overal bed mobility: Modified Independent             General bed mobility comments: Increased time; no physical assist    Transfers Overall transfer level: Needs assistance   Transfers: Sit to/from Stand Sit to Stand: Contact guard assist           General transfer comment: Steady assist for safety only    Ambulation/Gait Ambulation/Gait assistance: Contact guard assist Gait Distance (Feet): 300 Feet Assistive device: Rolling walker (2 wheels) Gait Pattern/deviations: Step-through pattern, Shuffle Gait velocity: mod pace     General Gait Details: Cues for posture and position from RW  Stairs            Wheelchair Mobility     Tilt Bed    Modified  Rankin (Stroke Patients Only)       Balance Overall balance assessment: Mild deficits observed, not formally tested                                           Pertinent Vitals/Pain Pain Assessment Pain Assessment: No/denies pain    Home Living Family/patient expects to be discharged to:: Private residence Living Arrangements: Alone Available Help at Discharge: Family;Friend(s);Available PRN/intermittently Type of Home: House Home Access: Stairs to enter Entrance Stairs-Rails: None (pull handle) Entrance Stairs-Number of Steps: 2-3   Home Layout: One level Home Equipment: Agricultural consultant (2 wheels);Cane - single point      Prior Function Prior Level of Function : Independent/Modified Independent             Mobility Comments: very active, enjoys cycling, scuba diving ADLs Comments: ind     Extremity/Trunk Assessment   Upper Extremity Assessment Upper Extremity Assessment: Overall WFL for tasks assessed    Lower Extremity Assessment Lower Extremity Assessment: LLE deficits/detail    Cervical / Trunk Assessment Cervical / Trunk Assessment: Kyphotic  Communication   Communication Communication: No apparent difficulties    Cognition Arousal: Alert Behavior During Therapy: WFL for tasks assessed/performed                             Following commands: Intact  Cueing Cueing Techniques: Verbal cues     General Comments      Exercises     Assessment/Plan    PT Assessment Patient needs continued PT services  PT Problem List Decreased activity tolerance;Decreased balance;Decreased mobility       PT Treatment Interventions DME instruction;Gait training;Stair training;Functional mobility training;Therapeutic activities;Therapeutic exercise;Patient/family education;Balance training    PT Goals (Current goals can be found in the Care Plan section)  Acute Rehab PT Goals Patient Stated Goal: REgain IND PT Goal  Formulation: With patient Time For Goal Achievement: 12/03/23 Potential to Achieve Goals: Good    Frequency 7X/week     Co-evaluation               AM-PAC PT 6 Clicks Mobility  Outcome Measure Help needed turning from your back to your side while in a flat bed without using bedrails?: A Little Help needed moving from lying on your back to sitting on the side of a flat bed without using bedrails?: A Little Help needed moving to and from a bed to a chair (including a wheelchair)?: A Little Help needed standing up from a chair using your arms (e.g., wheelchair or bedside chair)?: A Little Help needed to walk in hospital room?: A Little Help needed climbing 3-5 steps with a railing? : A Little 6 Click Score: 18    End of Session Equipment Utilized During Treatment: Gait belt Activity Tolerance: Patient tolerated treatment well Patient left: in chair;with call bell/phone within reach Nurse Communication: Mobility status PT Visit Diagnosis: Unsteadiness on feet (R26.81)    Time: 1202-1224 PT Time Calculation (min) (ACUTE ONLY): 22 min   Charges:   PT Evaluation $PT Eval Low Complexity: 1 Low   PT General Charges $$ ACUTE PT VISIT: 1 Visit         [facility] PT Acute Rehabilitation Services Office [phone]   [facility] 11/27/2023, 2:33 PM

## 2023-11-27 NOTE — Anesthesia Postprocedure Evaluation (Signed)
 Anesthesia Post Note  Patient: [name] Sleep  Procedure(s) Performed: IRRIGATION AND DEBRIDEMENT KNEE (Left: Knee)     Patient location during evaluation: PACU Anesthesia Type: General Level of consciousness: awake Pain management: pain level controlled Vital Signs Assessment: post-procedure vital signs reviewed and stable Respiratory status: spontaneous breathing, nonlabored ventilation and respiratory function stable Cardiovascular status: blood pressure returned to baseline and stable Postop Assessment: no apparent nausea or vomiting Anesthetic complications: no   No notable events documented.  Last Vitals:  Vitals:   11/27/23 0135 11/27/23 0533  BP: 128/79 (!) 122/58  Pulse: 68 74  Resp: 16 16  Temp: 37.1 C 36.8 C  SpO2: 94% 95%    Last Pain:  Vitals:   11/27/23 0533  TempSrc: Oral  PainSc:                  [name] [name]

## 2023-11-27 NOTE — Progress Notes (Signed)
 Patient ID: [name], male   DOB: 28-Jul-1950, 73 y.o.   MRN: [id number] Subjective: 1 Day Post-Op Procedure(s) (LRB): IRRIGATION AND DEBRIDEMENT KNEE (Left)    Patient reports pain as mild. Has experienced some nausea since procedure yesterday, using Zofran  to help with this. No events overnight ID consulted but have not seen him yet  Objective:   VITALS:   Vitals:   11/27/23 0135 11/27/23 0533  BP: 128/79 (!) 122/58  Pulse: 68 74  Resp: 16 16  Temp: 98.8 F (37.1 C) 98.3 F (36.8 C)  SpO2: 94% 95%    Neurovascular intact Incision: dressing C/D/I  LABS Recent Labs    11/25/23 1840 11/27/23 0324  HGB 12.2* 11.7*  HCT 37.8* 34.5*  WBC 11.0* 8.3  PLT 124* 146*    Recent Labs    11/25/23 1840 11/27/23 0324  NA 132* 132*  K 4.9 4.8  BUN 10 16  CREATININE 0.66 0.93  GLUCOSE 114* 182*    No results for input(s): LABPT, INR in the last 72 hours.   Assessment/Plan: 1 Day Post-Op Procedure(s) (LRB): IRRIGATION AND DEBRIDEMENT KNEE (Left)   Continue ABX therapy due to findings of left knee septic prepatellar space bursitis  Activity ad lib once determined safe Hep lock IV other than scheduled antibiotics  Goal is revision arthroplasty preservation.  Awaiting cultures and ID input.  Hopeful for MSSA versus Staph epi.  Last MRSA screening was negative. Awaiting appropriate antibiotic selection and determination of need for PICC line  Discharge pending antibiotic selection and need or lack of PICC  Otherwise regular diet

## 2023-11-28 ENCOUNTER — Other Ambulatory Visit: Payer: Self-pay

## 2023-11-28 ENCOUNTER — Inpatient Hospital Stay (HOSPITAL_COMMUNITY)

## 2023-11-28 DIAGNOSIS — R918 Other nonspecific abnormal finding of lung field: Secondary | ICD-10-CM | POA: Diagnosis not present

## 2023-11-28 DIAGNOSIS — J90 Pleural effusion, not elsewhere classified: Secondary | ICD-10-CM | POA: Diagnosis not present

## 2023-11-28 DIAGNOSIS — Z452 Encounter for adjustment and management of vascular access device: Secondary | ICD-10-CM | POA: Diagnosis not present

## 2023-11-28 DIAGNOSIS — J984 Other disorders of lung: Secondary | ICD-10-CM | POA: Diagnosis not present

## 2023-11-28 LAB — CBC
HCT: 35.2 % — ABNORMAL LOW (ref 39.0–52.0)
Hemoglobin: 11.7 g/dL — ABNORMAL LOW (ref 13.0–17.0)
MCH: 35.3 pg — ABNORMAL HIGH (ref 26.0–34.0)
MCHC: 33.2 g/dL (ref 30.0–36.0)
MCV: 106.3 fL — ABNORMAL HIGH (ref 80.0–100.0)
Platelets: 159 K/uL (ref 150–400)
RBC: 3.31 MIL/uL — ABNORMAL LOW (ref 4.22–5.81)
RDW: 14.2 % (ref 11.5–15.5)
WBC: 8.9 K/uL (ref 4.0–10.5)
nRBC: 0 % (ref 0.0–0.2)

## 2023-11-28 LAB — CK: Total CK: 42 U/L — ABNORMAL LOW (ref 49–397)

## 2023-11-28 MED ORDER — CHLORHEXIDINE GLUCONATE CLOTH 2 % EX PADS
6.0000 | MEDICATED_PAD | Freq: Every day | CUTANEOUS | Status: DC
  Administered 2023-11-28 – 2023-12-01 (×4): 6 via TOPICAL

## 2023-11-28 MED ORDER — SODIUM CHLORIDE 0.9% FLUSH: 10.0000 mL | INTRAVENOUS | Status: DC | PRN

## 2023-11-28 NOTE — Progress Notes (Signed)
 Physical Therapy Treatment Patient Details Name: [name] MRN: [id number] DOB: 1950/06/02 Today's Date: 11/28/2023   History of Present Illness Pt s/p I&D of L knee 2* septic prepatellar bursitis.  Pt with hx of Bil TKR and R TSR    PT Comments  Pt continues very motivated and progressing well with mobility including progression to no AD and negotiated stairs with single rail.  Pt hopeful for dc home Monday.    If plan is discharge home, recommend the following: A little help with walking and/or transfers;A little help with bathing/dressing/bathroom;Assistance with cooking/housework;Assist for transportation;Help with stairs or ramp for entrance   Can travel by private vehicle        Equipment Recommendations  None recommended by PT    Recommendations for Other Services       Precautions / Restrictions Precautions Precautions: Fall Restrictions Weight Bearing Restrictions Per Provider Order: No     Mobility  Bed Mobility Overal bed mobility: Modified Independent                  Transfers Overall transfer level: Modified independent                      Ambulation/Gait Ambulation/Gait assistance: Contact guard assist, Supervision Gait Distance (Feet): 300 Feet (and 15' twice to/from bathroom) Assistive device: None Gait Pattern/deviations: Step-through pattern, WFL(Within Functional Limits), Shuffle Gait velocity: mod pace     General Gait Details: min instability and no overt LOB noted   Stairs Stairs: Yes Stairs assistance: Contact guard assist, Supervision Stair Management: One rail Left, Forwards, Alternating pattern Number of Stairs: 7     Wheelchair Mobility     Tilt Bed    Modified Rankin (Stroke Patients Only)       Balance Overall balance assessment: Mild deficits observed, not formally tested                                          Communication Communication Communication: No apparent  difficulties  Cognition Arousal: Alert Behavior During Therapy: WFL for tasks assessed/performed                             Following commands: Intact      Cueing Cueing Techniques: Verbal cues  Exercises      General Comments        Pertinent Vitals/Pain Pain Assessment Pain Assessment: No/denies pain    Home Living                          Prior Function            PT Goals (current goals can now be found in the care plan section) Acute Rehab PT Goals Patient Stated Goal: REgain IND PT Goal Formulation: With patient Time For Goal Achievement: 12/03/23 Potential to Achieve Goals: Good Progress towards PT goals: Progressing toward goals    Frequency    7X/week      PT Plan      Co-evaluation              AM-PAC PT 6 Clicks Mobility   Outcome Measure  Help needed turning from your back to your side while in a flat bed without using bedrails?: None Help needed moving from lying on your back to sitting  on the side of a flat bed without using bedrails?: None Help needed moving to and from a bed to a chair (including a wheelchair)?: A Little Help needed standing up from a chair using your arms (e.g., wheelchair or bedside chair)?: None Help needed to walk in hospital room?: A Little Help needed climbing 3-5 steps with a railing? : A Little 6 Click Score: 21    End of Session Equipment Utilized During Treatment: Gait belt Activity Tolerance: Patient tolerated treatment well Patient left: in bed;with call bell/phone within reach Nurse Communication: Mobility status PT Visit Diagnosis: Unsteadiness on feet (R26.81)     Time: 8566-8545 PT Time Calculation (min) (ACUTE ONLY): 21 min  Charges:    $Gait Training: 8-22 mins PT General Charges $$ ACUTE PT VISIT: 1 Visit                     [facility] PT Acute Rehabilitation Services Office [phone]    Kaylon Laroche 11/28/2023, 4:13 PM

## 2023-11-28 NOTE — Progress Notes (Signed)
      INFECTIOUS DISEASE ATTENDING ADDENDUM:   OR cultures with MSSA  Narrow to ancef   Diagnosis: Septic bursitis  Culture Result: MSSA  Allergies  Allergen Reactions   Tape Other (See Comments)    Causes bleeding and redness of the skin.SABRA SKIN IS VERY THIN!!   Egg-Derived Products Diarrhea, Nausea Only and Other (See Comments)    Terrible stomach pain    OPAT Orders Discharge antibiotics to be given via PICC line Discharge antibiotics: Cefazolin  2 grams IV every 8 hours Duration: 3 weeks postop End Date: 12/17/2023  [facility] Care Per Protocol:  Home health RN for IV administration and teaching; PICC line care and labs.    Labs weekly while on IV antibiotics: __x CBC with differential _x_ BMP __ CMP _x_ CRP _x_ ESR __ Vancomycin  trough __ CK  x__ Please pull PIC at completion of IV antibiotics __ Please leave PIC in place until doctor has seen patient or been notified  Fax weekly labs to [phone]  Clinic Follow Up Appt:   Hollis Tuller Vangorden has an appointment on 12/17/2023 at 9AM with Dr. Dea at  [facility] for Infectious [facility], which  is located in the [facility] at  [address] in [place].  [address], which is located to the left of the elevators.  Phone: [phone]  Fax: [phone]  [email or web address]  The patient should arrive 30 minutes prior to their appoitment.   I will sign off for now please call with further questions.  Jomarie [place] 11/28/2023, 1:19 PM

## 2023-11-28 NOTE — Plan of Care (Signed)

## 2023-11-28 NOTE — Progress Notes (Signed)
   11/27/23 2148  BiPAP/CPAP/SIPAP  BiPAP/CPAP/SIPAP Pt Type Adult (patient stated he will self administere when ready to go to sleep)  BiPAP/CPAP/SIPAP DREAMSTATIOND  Mask Type Nasal mask  Dentures removed? Not applicable  FiO2 (%) 21 %  Patient Home Machine No  Patient Home Mask Yes  Patient Home Tubing Yes  Auto Titrate Yes  Minimum cmH2O 10 cmH2O  Maximum cmH2O 20 cmH2O  BiPAP/CPAP /SiPAP Vitals  Bilateral Breath Sounds Diminished;Clear  MEWS Score/Color  MEWS Score 0  MEWS Score Color Landy

## 2023-11-28 NOTE — Progress Notes (Addendum)
 Peripherally Inserted Central Catheter Placement  The IV Nurse has discussed with the patient and/or persons authorized to consent for the patient, the purpose of this procedure and the potential benefits and risks involved with this procedure.  The benefits include less needle sticks, lab draws from the catheter, and the patient may be discharged home with the catheter. Risks include, but not limited to, infection, bleeding, blood clot (thrombus formation), and puncture of an artery; nerve damage and irregular heartbeat and possibility to perform a PICC exchange if needed/ordered by physician.  Alternatives to this procedure were also discussed.  Bard Power PICC patient education guide, fact sheet on infection prevention and patient information card has been provided to patient /or left at bedside.  Progressive P wave elevation noted throughout the SVC,  PICC Placement Documentation  PICC Single Lumen 11/28/23 Left Brachial 43 cm 0 cm (Active)  Indication for Insertion or Continuance of Line Home intravenous therapies (PICC only) 11/28/23 1205  Exposed Catheter (cm) 0 cm 11/28/23 1205  Site Assessment Clean, Dry, Intact 11/28/23 1205  Line Status Saline locked;Flushed;Blood return noted 11/28/23 1205  Dressing Type Transparent;Securing device 11/28/23 1205  Dressing Status Antimicrobial disc/dressing in place;Clean, Dry, Intact 11/28/23 1205  Line Care Tubing changed;Cap changed;Connections checked and tightened 11/28/23 1205  Line Adjustment (NICU/IV Team Only) No 11/28/23 1205  Dressing Intervention New dressing;Adhesive placed at insertion site (IV team only);Adhesive placed around edges of dressing (IV team/ICU RN only) 11/28/23 1205  Dressing Change Due 12/05/23 11/28/23 1205       [name] 11/28/2023, 12:05 PM

## 2023-11-28 NOTE — Progress Notes (Signed)
 PHARMACY CONSULT NOTE FOR:  OUTPATIENT  PARENTERAL ANTIBIOTIC THERAPY (OPAT)  Indication: septic bursitis Regimen: cefazolin  2 gm IV q8hrs End date: 12/17/2023  IV antibiotic discharge orders are pended. To discharging provider:  please sign these orders via discharge navigator,  Select New Orders & click on the button choice - Manage This Unsigned Work.     Thank you for allowing pharmacy to be a part of this patient's care.  Rosaline IVAR Edison, Pharm.D Use secure chat for questions 11/28/2023 1:38 PM

## 2023-11-28 NOTE — Plan of Care (Signed)
   Problem: Education: Goal: Knowledge of General Education information will improve Description Including pain rating scale, medication(s)/side effects and non-pharmacologic comfort measures Outcome: Progressing   Problem: Health Behavior/Discharge Planning: Goal: Ability to manage health-related needs will improve Outcome: Progressing

## 2023-11-28 NOTE — Plan of Care (Signed)
  Problem: Education: Goal: Knowledge of General Education information will improve Description: Including pain rating scale, medication(s)/side effects and non-pharmacologic comfort measures 11/28/2023 1912 by Doyne Aleck LABOR, LPN Outcome: Progressing 11/28/2023 1912 by Doyne Aleck LABOR, LPN Outcome: Progressing   Problem: Health Behavior/Discharge Planning: Goal: Ability to manage health-related needs will improve 11/28/2023 1912 by Doyne Aleck LABOR, LPN Outcome: Progressing 11/28/2023 1912 by Doyne Aleck LABOR, LPN Outcome: Progressing   Problem: Clinical Measurements: Goal: Ability to maintain clinical measurements within normal limits will improve Outcome: Progressing   Problem: Activity: Goal: Risk for activity intolerance will decrease 11/28/2023 1912 by Doyne Aleck LABOR, LPN Outcome: Progressing 11/28/2023 1912 by Doyne Aleck LABOR, LPN Outcome: Progressing   Problem: Nutrition: Goal: Adequate nutrition will be maintained 11/28/2023 1912 by Doyne Aleck LABOR, LPN Outcome: Progressing 11/28/2023 1912 by Doyne Aleck LABOR, LPN Outcome: Progressing   Problem: Coping: Goal: Level of anxiety will decrease 11/28/2023 1912 by Doyne Aleck LABOR, LPN Outcome: Progressing 11/28/2023 1912 by Doyne Aleck LABOR, LPN Outcome: Progressing   Problem: Elimination: Goal: Will not experience complications related to bowel motility 11/28/2023 1912 by Doyne Aleck LABOR, LPN Outcome: Progressing 11/28/2023 1912 by Doyne Aleck LABOR, LPN Outcome: Progressing   Problem: Pain Managment: Goal: General experience of comfort will improve and/or be controlled Outcome: Progressing

## 2023-11-29 LAB — BODY FLUID CULTURE W GRAM STAIN: Special Requests: NORMAL

## 2023-11-29 NOTE — Plan of Care (Signed)

## 2023-11-29 NOTE — Plan of Care (Signed)
   Problem: Education: Goal: Knowledge of General Education information will improve Description Including pain rating scale, medication(s)/side effects and non-pharmacologic comfort measures Outcome: Progressing

## 2023-11-29 NOTE — Progress Notes (Addendum)
   Subjective: 3 Days Post-Op Procedure(s) (LRB): IRRIGATION AND DEBRIDEMENT KNEE (Left) Patient reports pain as mild.   Patient seen in rounds for Dr. Ernie. Patient is doing well this morning, reports his knee is doing much better. PICC line in place.   Objective: Vital signs in last 24 hours: Temp:  [97.7 F (36.5 C)-98.3 F (36.8 C)] 97.7 F (36.5 C) (08/31 0549) Pulse Rate:  [60] 60 (08/30 2124) Resp:  [15-16] 16 (08/31 0549) BP: (128-141)/(61-77) 128/61 (08/31 0549) SpO2:  [96 %-99 %] 99 % (08/31 0549)  Intake/Output from previous day:  Intake/Output Summary (Last 24 hours) at 11/29/2023 0915 Last data filed at 11/29/2023 0844 Gross per 24 hour  Intake 1265.38 ml  Output 1450 ml  Net -184.62 ml    Intake/Output this shift: Total I/O In: 120 [P.O.:120] Out: 350 [Urine:350]  Labs: Recent Labs    11/27/23 0324 11/28/23 0821  HGB 11.7* 11.7*   Recent Labs    11/27/23 0324 11/28/23 0821  WBC 8.3 8.9  RBC 3.24* 3.31*  HCT 34.5* 35.2*  PLT 146* 159   Recent Labs    11/27/23 0324  NA 132*  K 4.8  CL 99  CO2 22  BUN 16  CREATININE 0.93  GLUCOSE 182*  CALCIUM 9.0   No results for input(s): LABPT, INR in the last 72 hours.  Exam: General - Patient is Alert and Oriented Extremity - Neurologically intact Neurovascular intact Sensation intact distally Dorsiflexion/Plantar flexion intact Dressing/Incision - clean, dry, no drainage Motor Function - intact, moving foot and toes well on exam.   Past Medical History:  Diagnosis Date   Allergy     Anxiety    Arthritis    knees   Asthma    Depression    Dysrhythmia    RBBB   Elevated PSA    us  and bx negative 2011   Hypertension    nl cardiolite in 1998   Pneumonia 2005   right hospitalized    Rhinosinusitis    Sleep apnea    wears CPAP    Assessment/Plan: 3 Days Post-Op Procedure(s) (LRB): IRRIGATION AND DEBRIDEMENT KNEE (Left) Principal Problem:   Septic prepatellar bursitis of left  knee Active Problems:   Prepatellar bursitis of left knee  Estimated body mass index is 34.44 kg/m as calculated from the following:   Height as of this encounter: 5' 4 (1.626 m).   Weight as of this encounter: 91 kg. Up with therapy  DVT Prophylaxis - Aspirin  Weight-bearing as tolerated  ID recs finalized Discharge to home with [facility] and HHPT tomorrow  Roxie Mess, PA-C Orthopedic Surgery [phone] 11/29/2023, 9:15 AM

## 2023-11-29 NOTE — Progress Notes (Signed)
   11/29/23 0101  BiPAP/CPAP/SIPAP  $ Non-Invasive Home Ventilator  Subsequent  BiPAP/CPAP/SIPAP Pt Type Adult  BiPAP/CPAP/SIPAP DREAMSTATIOND  Dentures removed? Not applicable  Patient Home Machine No  Patient Home Mask Yes  Patient Home Tubing No  Auto Titrate Yes  Minimum cmH2O 10 cmH2O  Maximum cmH2O 20 cmH2O  CPAP/SIPAP surface wiped down Yes  Device Plugged into RED Power Outlet Yes

## 2023-11-30 MED ORDER — DOCUSATE SODIUM 100 MG PO CAPS
100.0000 mg | ORAL_CAPSULE | Freq: Two times a day (BID) | ORAL | Status: DC
  Administered 2023-11-30 – 2023-12-01 (×3): 100 mg via ORAL
  Filled 2023-11-30 (×3): qty 1

## 2023-11-30 MED ORDER — ASPIRIN 81 MG PO CHEW: 81.0000 mg | CHEWABLE_TABLET | Freq: Two times a day (BID) | ORAL | 0 refills | Status: AC

## 2023-11-30 MED ORDER — CEFAZOLIN IV (FOR PTA / DISCHARGE USE ONLY): 2.0000 g | Freq: Three times a day (TID) | INTRAVENOUS | 0 refills | Status: AC

## 2023-11-30 NOTE — TOC Progression Note (Signed)
 Transition of Care [facility]) - Progression Note    Patient Details  Name: [name] MRN: [id number] Date of Birth: Jan 17, 1951  Transition of Care [facility]) CM/SW Contact  NORMAN ASPEN, LCSW Phone Number: 11/30/2023, 12:24 PM  Clinical Narrative:     Met with pt today to review dc needs for home IV abx and HHRN coverage.  Pt aware and agreeable with plan. PICC in place.  Pt has no agency preferences.  Referral placed with [facility] for the abx coverage.  HHRN coverage accepted by [facility].  Anticipate home tomorrow once abx management/ teaching completed and delivery of abx confirmed.     Expected Discharge Plan: Home w Home Health Services Barriers to Discharge: Continued Medical Work up               Expected Discharge Plan and Services In-house Referral: Clinical Social Work   Post Acute Care Choice: Home Health Living arrangements for the past 2 months: Single Family Home Expected Discharge Date: 11/30/23                         HH Arranged: RN, IV Antibiotics HH Agency: [facility] Date [facility] Agency Contacted: 11/30/23 Time HH Agency Contacted: 1223 Representative spoke with at [facility] Agency: [facility] Herring, RN;  [facility] - Cory/ Cindie   Social Drivers of Health (SDOH) Interventions SDOH Screenings   Food Insecurity: No Food Insecurity (11/25/2023)  Housing: Low Risk  (11/25/2023)  Transportation Needs: No Transportation Needs (11/25/2023)  Utilities: Not At Risk (11/25/2023)  Alcohol  Screen: Low Risk  (07/06/2023)  Depression (PHQ2-9): Low Risk  (10/14/2023)  Financial Resource Strain: Low Risk  (07/06/2023)  Physical Activity: Insufficiently Active (07/06/2023)  Social Connections: Moderately Isolated (11/25/2023)  Stress: No Stress Concern Present (07/06/2023)  Recent Concern: Stress - Stress Concern Present (06/16/2023)  Tobacco Use: Low Risk  (11/26/2023)  Health Literacy: Adequate Health Literacy (07/06/2023)    Readmission Risk  Interventions     No data to display

## 2023-11-30 NOTE — Plan of Care (Signed)
   Problem: Education: Goal: Knowledge of General Education information will improve Description: Including pain rating scale, medication(s)/side effects and non-pharmacologic comfort measures Outcome: Progressing   Problem: Health Behavior/Discharge Planning: Goal: Ability to manage health-related needs will improve Outcome: Progressing   Problem: Clinical Measurements: Goal: Ability to maintain clinical measurements within normal limits will improve Outcome: Progressing   Problem: Activity: Goal: Risk for activity intolerance will decrease Outcome: Progressing   Problem: Nutrition: Goal: Adequate nutrition will be maintained Outcome: Progressing   Problem: Coping: Goal: Level of anxiety will decrease Outcome: Progressing   Problem: Pain Managment: Goal: General experience of comfort will improve and/or be controlled Outcome: Progressing

## 2023-11-30 NOTE — Progress Notes (Signed)
 Patient ID: [name], male   DOB: 25-Mar-1951, 73 y.o.   MRN: [id number] Subjective: 4 Days Post-Op Procedure(s) (LRB): IRRIGATION AND DEBRIDEMENT KNEE (Left)    Patient reports pain as mild. Doing well overall Anxious to make sure all arrangements are made with regards to his IV antibiotics prior to discharge  Objective:   VITALS:   Vitals:   11/29/23 2225 11/30/23 0514  BP: 137/74 (!) 147/82  Pulse: (!) 51 (!) 53  Resp: 18 18  Temp: (!) 97.5 F (36.4 C) 97.6 F (36.4 C)  SpO2: 96% 95%    Neurovascular intact Incision: dressing C/D/I Mild prepatellar swelling noted Decrease LLE erythema  LABS Recent Labs    11/28/23 0821  HGB 11.7*  HCT 35.2*  WBC 8.9  PLT 159    No results for input(s): NA, K, BUN, CREATININE, GLUCOSE in the last 72 hours.  No results for input(s): LABPT, INR in the last 72 hours.   Assessment/Plan: 4 Days Post-Op Procedure(s) (LRB): IRRIGATION AND DEBRIDEMENT KNEE (Left)   Up with therapy Continue ABX therapy due to septic prepatellar bursitis in setting of revision left total knee replacement IF able to make all arrangements for IV antibiotics at home can be discharged today versus tomorrow Will likely need extension tubing for [facility] line access

## 2023-12-01 DIAGNOSIS — M71162 Other infective bursitis, left knee: Secondary | ICD-10-CM | POA: Diagnosis not present

## 2023-12-01 MED ORDER — HEPARIN SOD (PORK) LOCK FLUSH 100 UNIT/ML IV SOLN
250.0000 [IU] | INTRAVENOUS | Status: AC | PRN
  Administered 2023-12-01: 250 [IU]

## 2023-12-01 NOTE — Progress Notes (Signed)
   Subjective: 5 Days Post-Op Procedure(s) (LRB): IRRIGATION AND DEBRIDEMENT KNEE (Left) Patient reports pain as mild.   Patient seen in rounds for Dr. Ernie. Patient is well, and has had no acute complaints or problems. No acute events overnight. He was sitting at the side of the bed when I came in. He just had his teaching about PICC care.  We will start therapy today.   Objective: Vital signs in last 24 hours: Temp:  [97.4 F (36.3 C)-98.1 F (36.7 C)] 97.6 F (36.4 C) (09/02 0528) Pulse Rate:  [49-57] 49 (09/02 0528) Resp:  [17-18] 18 (09/02 0528) BP: (123-134)/(64-71) 134/68 (09/02 0528) SpO2:  [98 %-99 %] 99 % (09/02 0923) FiO2 (%):  [21 %] 21 % (09/02 0000)  Intake/Output from previous day:  Intake/Output Summary (Last 24 hours) at 12/01/2023 0937 Last data filed at 12/01/2023 9187 Gross per 24 hour  Intake 1920.67 ml  Output --  Net 1920.67 ml     Intake/Output this shift: Total I/O In: 460 [P.O.:460] Out: -   Labs: No results for input(s): HGB in the last 72 hours. No results for input(s): WBC, RBC, HCT, PLT in the last 72 hours. No results for input(s): NA, K, CL, CO2, BUN, CREATININE, GLUCOSE, CALCIUM in the last 72 hours. No results for input(s): LABPT, INR in the last 72 hours.  Exam: General - Patient is Alert and Oriented Extremity - Neurologically intact Sensation intact distally Intact pulses distally Dorsiflexion/Plantar flexion intact Dressing - dressing C/D/I Motor Function - intact, moving foot and toes well on exam.   Past Medical History:  Diagnosis Date   Allergy     Anxiety    Arthritis    knees   Asthma    Depression    Dysrhythmia    RBBB   Elevated PSA    us  and bx negative 2011   Hypertension    nl cardiolite in 1998   Pneumonia 2005   right hospitalized    Rhinosinusitis    Sleep apnea    wears CPAP    Assessment/Plan: 5 Days Post-Op Procedure(s) (LRB): IRRIGATION AND DEBRIDEMENT KNEE  (Left) Principal Problem:   Septic prepatellar bursitis of left knee Active Problems:   Prepatellar bursitis of left knee  Estimated body mass index is 34.44 kg/m as calculated from the following:   Height as of this encounter: 5' 4 (1.626 m).   Weight as of this encounter: 91 kg. Advance diet Up with therapy D/C IV fluids    DVT Prophylaxis - Aspirin  Weight bearing as tolerated.  I redressed his knee with mepilex as the aquacel was not staying in place He is getting an afternoon dose of abx then home today with [facility]  He declines need for oxycodone  at home  Rosina Calin, PA-C Orthopedic Surgery [phone] 12/01/2023, 9:37 AM

## 2023-12-01 NOTE — Plan of Care (Signed)
  Problem: Pain Management: Goal: Pain level will decrease with appropriate interventions Outcome: Progressing   Problem: Activity: Goal: Range of joint motion will improve Outcome: Progressing   Problem: Skin Integrity: Goal: Skin integrity will improve Outcome: Progressing   Problem: Safety: Goal: Ability to remain free from injury will improve Outcome: Progressing

## 2023-12-01 NOTE — Progress Notes (Signed)
 PT Cancellation Note  Patient Details Name: [name] MRN: [id number] DOB: 05/16/1950   Cancelled Treatment:    Reason Eval/Treat Not Completed: Other (comment); spoke with pt 9/1 regarding any changes to functional status. Pt states he remains relatively pain free, is just waiting for his ABx to be sorted out.  He met PT goals on Sunday 11/29/23--Mod I/IND   [facility] 12/01/2023, 9:44 AM

## 2023-12-01 NOTE — Progress Notes (Signed)
   12/01/23 0000  BiPAP/CPAP/SIPAP  BiPAP/CPAP/SIPAP Pt Type Adult  BiPAP/CPAP/SIPAP DREAMSTATIOND  Mask Type Nasal mask  Dentures removed? Not applicable  Respiratory Rate 18 breaths/min  FiO2 (%) 21 %  Patient Home Machine No  Patient Home Mask Yes  Patient Home Tubing No  Auto Titrate Yes  Minimum cmH2O 10 cmH2O  Maximum cmH2O 20 cmH2O  Device Plugged into [employer] Outlet Yes

## 2023-12-01 NOTE — TOC Transition Note (Signed)
 Transition of Care [facility]) - Discharge Note   Patient Details  Name: [name] MRN: [id number] Date of Birth: October 17, 1950  Transition of [facility] CM/SW Contact:  NORMAN ASPEN, LCSW Phone Number: 12/01/2023, 10:34 AM   Clinical Narrative:     Pt medically cleared for dc home today.  Home abx and [facility] services all in place and abx teaching completed.  No further TOC needs.  Final next level of care: Home w Home Health Services Barriers to Discharge: Barriers Resolved   Patient Goals and CMS Choice Patient states their goals for this hospitalization and ongoing recovery are:: return home          Discharge Placement                       Discharge Plan and Services Additional resources added to the After Visit Summary for   In-house Referral: Clinical Social Work   Post Acute Care Choice: Home Health          DME Arranged: N/A DME Agency: NA       HH Arranged: RN, IV Antibiotics HH Agency: [facility] Date [facility] Agency Contacted: 11/30/23 Time HH Agency Contacted: 1223 Representative spoke with at [facility] Agency: [employer] - Holley Herring, RN;  [facility] - Cory/ Cindie  Social Drivers of Health (SDOH) Interventions SDOH Screenings   Food Insecurity: No Food Insecurity (11/25/2023)  Housing: Low Risk  (11/25/2023)  Transportation Needs: No Transportation Needs (11/25/2023)  Utilities: Not At Risk (11/25/2023)  Alcohol  Screen: Low Risk  (07/06/2023)  Depression (PHQ2-9): Low Risk  (10/14/2023)  Financial Resource Strain: Low Risk  (07/06/2023)  Physical Activity: Insufficiently Active (07/06/2023)  Social Connections: Moderately Isolated (11/25/2023)  Stress: No Stress Concern Present (07/06/2023)  Recent Concern: Stress - Stress Concern Present (06/16/2023)  Tobacco Use: Low Risk  (11/26/2023)  Health Literacy: Adequate Health Literacy (07/06/2023)     Readmission Risk Interventions     No data to display

## 2023-12-02 ENCOUNTER — Telehealth: Payer: Self-pay | Admitting: *Deleted

## 2023-12-02 ENCOUNTER — Encounter: Payer: Self-pay | Admitting: Internal Medicine

## 2023-12-02 ENCOUNTER — Encounter: Payer: Self-pay | Admitting: *Deleted

## 2023-12-02 DIAGNOSIS — L03116 Cellulitis of left lower limb: Secondary | ICD-10-CM | POA: Diagnosis not present

## 2023-12-02 DIAGNOSIS — J45909 Unspecified asthma, uncomplicated: Secondary | ICD-10-CM | POA: Diagnosis not present

## 2023-12-02 DIAGNOSIS — I10 Essential (primary) hypertension: Secondary | ICD-10-CM | POA: Diagnosis not present

## 2023-12-02 DIAGNOSIS — G4733 Obstructive sleep apnea (adult) (pediatric): Secondary | ICD-10-CM | POA: Diagnosis not present

## 2023-12-02 DIAGNOSIS — B9561 Methicillin susceptible Staphylococcus aureus infection as the cause of diseases classified elsewhere: Secondary | ICD-10-CM | POA: Diagnosis not present

## 2023-12-02 DIAGNOSIS — M71162 Other infective bursitis, left knee: Secondary | ICD-10-CM | POA: Diagnosis not present

## 2023-12-02 DIAGNOSIS — I4510 Unspecified right bundle-branch block: Secondary | ICD-10-CM | POA: Diagnosis not present

## 2023-12-02 DIAGNOSIS — M1711 Unilateral primary osteoarthritis, right knee: Secondary | ICD-10-CM | POA: Diagnosis not present

## 2023-12-02 DIAGNOSIS — T8454XA Infection and inflammatory reaction due to internal left knee prosthesis, initial encounter: Secondary | ICD-10-CM | POA: Diagnosis not present

## 2023-12-02 NOTE — Telephone Encounter (Signed)
 This encounter was created in error - please disregard.

## 2023-12-02 NOTE — Transitions of Care (Post Inpatient/ED Visit) (Signed)
   12/02/2023  Name: [name] MRN: [id number] DOB: [birth year]  Today's TOC FU Call Status: Today's TOC FU Call Status:: Unsuccessful Call (1st Attempt) Unsuccessful Call (1st Attempt) Date: 12/02/23  Attempted to reach the patient regarding the most recent Inpatient/ED visit.  Follow Up Plan: Additional outreach attempts will be made to reach the patient to complete the Transitions of Care (Post Inpatient/ED visit) call.   Mliss Creed [facility], BSN RN Care Manager/ Transition of Care [place] [phone]

## 2023-12-03 ENCOUNTER — Encounter (HOSPITAL_BASED_OUTPATIENT_CLINIC_OR_DEPARTMENT_OTHER): Attending: Internal Medicine | Admitting: Internal Medicine

## 2023-12-03 ENCOUNTER — Telehealth: Payer: Self-pay | Admitting: *Deleted

## 2023-12-03 DIAGNOSIS — S80811A Abrasion, right lower leg, initial encounter: Secondary | ICD-10-CM | POA: Insufficient documentation

## 2023-12-03 DIAGNOSIS — L97818 Non-pressure chronic ulcer of other part of right lower leg with other specified severity: Secondary | ICD-10-CM | POA: Insufficient documentation

## 2023-12-03 DIAGNOSIS — L03116 Cellulitis of left lower limb: Secondary | ICD-10-CM | POA: Insufficient documentation

## 2023-12-03 DIAGNOSIS — S80811D Abrasion, right lower leg, subsequent encounter: Secondary | ICD-10-CM

## 2023-12-03 DIAGNOSIS — I87311 Chronic venous hypertension (idiopathic) with ulcer of right lower extremity: Secondary | ICD-10-CM | POA: Diagnosis not present

## 2023-12-03 DIAGNOSIS — X58XXXA Exposure to other specified factors, initial encounter: Secondary | ICD-10-CM | POA: Diagnosis not present

## 2023-12-03 LAB — CULTURE, BLOOD (ROUTINE X 2)
Culture: NO GROWTH
Culture: NO GROWTH

## 2023-12-03 NOTE — Transitions of Care (Post Inpatient/ED Visit) (Signed)
   12/03/2023  Name: [name] MRN: [id number] DOB: 1950-05-19  Today's TOC FU Call Status: Today's TOC FU Call Status:: Unsuccessful Call (2nd Attempt) Unsuccessful Call (2nd Attempt) Date: 12/03/23  Attempted to reach the patient regarding the most recent Inpatient/ED visit.  Follow Up Plan: Additional outreach attempts will be made to reach the patient to complete the Transitions of Care (Post Inpatient/ED visit) call.   Mliss Creed [facility], BSN RN Care Manager/ Transition of Care [place]/ [facility] [phone]

## 2023-12-04 ENCOUNTER — Telehealth: Payer: Self-pay

## 2023-12-04 NOTE — Transitions of Care (Post Inpatient/ED Visit) (Signed)
   12/04/2023  Name: Timouthy Gilardi Summey MRN: [id number] DOB: 16-Jun-1950  Today's TOC FU Call Status: Today's TOC FU Call Status:: Successful TOC FU Call Completed TOC FU Call Complete Date: 12/04/23 Patient's Name and Date of Birth confirmed.  Transition Care Management Follow-up Telephone Call How have you been since you were released from the hospital?: Better (reports antibiotic therapy is going well.) Any questions or concerns?: No  Items Reviewed: Did you receive and understand the discharge instructions provided?: Yes Medications obtained,verified, and reconciled?: No Medications Not Reviewed Reasons:: Other: (declined medication review)   Patient returned call. Explained reason for call. Patient reports to me that he understands his instructions. Report his antibiotic therapy is going well.  Denies need to review discharge instructions, medications or follow ups. I did encouraged patient to follow up with PCP. He states he went to the wound clinic yesterday.  I encouraged patient that if he changes his mind or needs assistance to call me or call his PCP. He verbally agreed.       Alan Ee, RN, BSN, CEN [employer]- Transition of Care Team.  [facility] [phone]

## 2023-12-04 NOTE — Transitions of Care (Post Inpatient/ED Visit) (Signed)
   12/04/2023  Name: [name] MRN: [id number] DOB: 03/28/1951  Today's TOC FU Call Status: Today's TOC FU Call Status:: Unsuccessful Call (3rd Attempt) Unsuccessful Call (3rd Attempt) Date: 12/04/23  Attempted to reach the patient regarding the most recent Inpatient/ED visit.  Follow Up Plan: No further outreach attempts will be made at this time. We have been unable to contact the patient.  Alan Ee, RN, BSN, CEN [employer]- Transition of Care Team.  [facility] [phone]

## 2023-12-07 DIAGNOSIS — I4510 Unspecified right bundle-branch block: Secondary | ICD-10-CM | POA: Diagnosis not present

## 2023-12-07 DIAGNOSIS — Z5181 Encounter for therapeutic drug level monitoring: Secondary | ICD-10-CM | POA: Diagnosis not present

## 2023-12-07 DIAGNOSIS — Z792 Long term (current) use of antibiotics: Secondary | ICD-10-CM | POA: Diagnosis not present

## 2023-12-07 DIAGNOSIS — M1711 Unilateral primary osteoarthritis, right knee: Secondary | ICD-10-CM | POA: Diagnosis not present

## 2023-12-07 DIAGNOSIS — T8454XA Infection and inflammatory reaction due to internal left knee prosthesis, initial encounter: Secondary | ICD-10-CM | POA: Diagnosis not present

## 2023-12-07 DIAGNOSIS — G4733 Obstructive sleep apnea (adult) (pediatric): Secondary | ICD-10-CM | POA: Diagnosis not present

## 2023-12-07 DIAGNOSIS — M71162 Other infective bursitis, left knee: Secondary | ICD-10-CM | POA: Diagnosis not present

## 2023-12-07 DIAGNOSIS — B9561 Methicillin susceptible Staphylococcus aureus infection as the cause of diseases classified elsewhere: Secondary | ICD-10-CM | POA: Diagnosis not present

## 2023-12-07 DIAGNOSIS — J45909 Unspecified asthma, uncomplicated: Secondary | ICD-10-CM | POA: Diagnosis not present

## 2023-12-07 DIAGNOSIS — I10 Essential (primary) hypertension: Secondary | ICD-10-CM | POA: Diagnosis not present

## 2023-12-07 DIAGNOSIS — L03116 Cellulitis of left lower limb: Secondary | ICD-10-CM | POA: Diagnosis not present

## 2023-12-08 NOTE — Discharge Summary (Signed)
 Patient ID: [name] MRN: [id number] DOB/AGE: 07-Feb-1951 73 y.o.  Admit date: 11/25/2023 Discharge date: 12/01/2023  Admission Diagnoses:  Septic prepatellar bursitis, left knee   Discharge Diagnoses:  Principal Problem:   Septic prepatellar bursitis of left knee Active Problems:   Prepatellar bursitis of left knee   Past Medical History:  Diagnosis Date   Allergy     Anxiety    Arthritis    knees   Asthma    Depression    Dysrhythmia    RBBB   Elevated PSA    us  and bx negative 2011   Hypertension    nl cardiolite in 1998   Pneumonia 2005   right hospitalized    Rhinosinusitis    Sleep apnea    wears CPAP    Surgeries: Procedure(s): IRRIGATION AND DEBRIDEMENT KNEE on 11/26/2023   Consultants:   Discharged Condition: Improved  Hospital Course: Jaquan Sadowsky Sakata is an 73 y.o. male who was admitted 11/25/2023 for operative treatment ofSeptic prepatellar bursitis of left knee. After pre-op clearance the patient was taken to the operating room on 11/26/2023 and underwent  Procedure(s): IRRIGATION AND DEBRIDEMENT KNEE.    Patient was given perioperative antibiotics:  Anti-infectives (From admission, onward)    Start     Dose/Rate Route Frequency Ordered Stop   11/30/23 0000  ceFAZolin  (ANCEF ) IVPB        2 g Intravenous Every 8 hours 11/30/23 1052 12/17/23 2359   11/27/23 1130  DAPTOmycin  (CUBICIN ) IVPB 700 mg/118mL premix  Status:  Discontinued        10 mg/kg  71.9 kg (Adjusted) 200 mL/hr over 30 Minutes Intravenous Daily 11/27/23 1034 11/28/23 1318   11/26/23 1242  vancomycin  (VANCOCIN ) powder  Status:  Discontinued          As needed 11/26/23 1242 11/26/23 1302   11/25/23 2200  ceFAZolin  (ANCEF ) IVPB 2g/100 mL premix  Status:  Discontinued        2 g 200 mL/hr over 30 Minutes Intravenous Every 8 hours 11/25/23 1702 12/01/23 2051        Patient was given sequential compression devices, early ambulation, and chemoprophylaxis to prevent DVT. Patient worked  with PT and was meeting their goals regarding safe ambulation and transfers. He had a PICC placed and ID made recommendations for abx regimen.  Patient benefited maximally from hospital stay and there were no complications.    Recent vital signs: No data found.   Recent laboratory studies: No results for input(s): WBC, HGB, HCT, PLT, NA, K, CL, CO2, BUN, CREATININE, GLUCOSE, INR, CALCIUM in the last 72 hours.  Invalid input(s): PT, 2   Discharge Medications:   Allergies as of 12/01/2023       Reactions   Tape Other (See Comments)   Causes bleeding and redness of the skin.SABRA SKIN IS VERY THIN!!   Egg-derived Products Diarrhea, Nausea Only, Other (See Comments)   Terrible stomach pain        Medication List     STOP taking these medications    aspirin  EC 81 MG tablet Replaced by: aspirin  81 MG chewable tablet   cephALEXin 500 MG capsule Commonly known as: KEFLEX       TAKE these medications    amLODipine -benazepril  5-10 MG capsule Commonly known as: LOTREL TAKE 1 CAPSULE BY MOUTH EVERY DAY What changed:  how much to take how to take this when to take this additional instructions   aspirin  81 MG chewable tablet Chew 1 tablet (81 mg  total) by mouth 2 (two) times daily for 28 days. Replaces: aspirin  EC 81 MG tablet   ceFAZolin  IVPB Commonly known as: ANCEF  Inject 2 g into the vein every 8 (eight) hours for 17 days. Indication:  septic bursitis First Dose: No Last Day of Therapy:  12/17/2023 Labs - Once weekly:  CBC/D and BMP, Labs - Once weekly: ESR and CRP Method of administration: IV Push Method of administration may be changed at the discretion of home infusion pharmacist based upon assessment of the patient and/or caregiver's ability to self-administer the medication ordered.   clobetasol 0.05 % external solution Commonly known as: TEMOVATE Apply 1 Application topically 2 (two) times daily as needed (for itching- as  directed).   Combivent  Respimat 20-100 MCG/ACT Aers respimat Generic drug: Ipratropium-Albuterol  Inhale 1 puff up to four times daily as needed What changed:  how much to take how to take this when to take this reasons to take this additional instructions   escitalopram  10 MG tablet Commonly known as: LEXAPRO  Take 1 tablet (10 mg total) by mouth daily. What changed: when to take this   fexofenadine 180 MG tablet Commonly known as: ALLEGRA Take 180 mg by mouth at bedtime.   fluticasone  50 MCG/ACT nasal spray Commonly known as: FLONASE  SPRAY 2 SPRAYS INTO EACH NOSTRIL EVERY DAY What changed: See the new instructions.   ibuprofen  800 MG tablet Commonly known as: ADVIL  Take 800 mg by mouth in the morning and at bedtime.   Pataday  0.7 % Soln Generic drug: Olopatadine  HCl Place 1 drop into both eyes daily as needed (for allergies).   Soothe XP Soln Place 1 drop into both eyes 3 (three) times daily as needed (for dryness).   tadalafil 5 MG tablet Commonly known as: CIALIS Take 5 mg by mouth at bedtime.   Wixela Inhub 100-50 MCG/ACT Aepb Generic drug: fluticasone -salmeterol TAKE 1 PUFF BY MOUTH TWICE A DAY What changed: See the new instructions.               Discharge Care Instructions  (From admission, onward)           Start     Ordered   11/30/23 0000  Change dressing       Comments: Maintain surgical dressing until follow up in the clinic. If the edges start to pull up, may reinforce with tape. If the dressing is no longer working, may remove and cover with gauze and tape, but must keep the area dry and clean.  Call with any questions or concerns.   11/30/23 1030   11/30/23 0000  Change dressing on IV access line weekly and PRN  (Home infusion instructions - [facility] )        11/30/23 1052            Diagnostic Studies: DG CHEST PORT 1 VIEW Result Date: 11/28/2023 EXAM: 1 VIEW XRAY OF THE CHEST 11/28/2023 01:44:00 PM COMPARISON:  11/11/2017 CLINICAL HISTORY: S/p PICC line placement FINDINGS: LUNGS AND PLEURA: Small-to-moderate layer in right pleural effusion. Probable small left pleural effusion. Right greater than left base airspace disease. No congestive heart failure. HEART AND MEDIASTINUM: Mild cardiomegaly. 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 Mild limitation secondary to patient body habitus. LINES AND TUBES: Left upper extremity PICC in place with tip overlying the expected area of the lower SVC. IMPRESSION: 1. Small-to-moderate right pleural effusion and probable small left pleural effusion. 2. Right greater than left base airspace disease. At  the left lung base, likely atelectasis. At the right lung base, favor pneumonia or aspiration. 3. Mild cardiomegaly. No congestive heart failure. 4. Left upper extremity PICC in place with tip overlying the expected area of the lower SVC. No pneumothorax. Electronically signed by: Rockey Kilts MD 11/28/2023 02:35 PM EDT RP Workstation: [id number]   US  EKG SITE RITE Result Date: 11/28/2023 If Site Rite image not attached, placement could not be confirmed due to current cardiac rhythm.   Disposition: Discharge disposition: 01-Home or Self Care       Discharge Instructions     [facility] pharmacist to adjust dose for Vancomycin , Aminoglycosides and other anti-infective therapies as requested by physician.   Complete by: As directed    [facility] to provide Cath Flo 2mg    Complete by: As directed    Administer for PICC line occlusion and as ordered by physician for other access device issues.   Anaphylaxis Kit: Provided to treat any anaphylactic reaction to the medication being provided to the patient if First Dose or when requested by physician   Complete by: As directed    Epinephrine  1mg /ml vial / amp: Administer 0.3mg  (0.83ml) subcutaneously once for moderate to severe anaphylaxis, nurse to call physician  and pharmacy when reaction occurs and call 911 if needed for immediate care   Diphenhydramine  50mg /ml IV vial: Administer 25-50mg  IV/IM PRN for first dose reaction, rash, itching, mild reaction, nurse to call physician and pharmacy when reaction occurs   Sodium Chloride  0.9% NS 500ml IV: Administer if needed for hypovolemic blood pressure drop or as ordered by physician after call to physician with anaphylactic reaction   Call MD / Call 911   Complete by: As directed    If you experience chest pain or shortness of breath, CALL 911 and be transported to the hospital emergency room.  If you develope a fever above 101 F, pus (white drainage) or increased drainage or redness at the wound, or calf pain, call your surgeon's office.   Call MD / Call 911   Complete by: As directed    If you experience chest pain or shortness of breath, CALL 911 and be transported to the hospital emergency room.  If you develope a fever above 101 F, pus (white drainage) or increased drainage or redness at the wound, or calf pain, call your surgeon's office.   Change dressing   Complete by: As directed    Maintain surgical dressing until follow up in the clinic. If the edges start to pull up, may reinforce with tape. If the dressing is no longer working, may remove and cover with gauze and tape, but must keep the area dry and clean.  Call with any questions or concerns.   Change dressing on IV access line weekly and PRN   Complete by: As directed    Constipation Prevention   Complete by: As directed    Drink plenty of fluids.  Prune juice may be helpful.  You may use a stool softener, such as Colace (over the counter) 100 mg twice a day.  Use MiraLax  (over the counter) for constipation as needed.   Constipation Prevention   Complete by: As directed    Drink plenty of fluids.  Prune juice may be helpful.  You may use a stool softener, such as Colace (over the counter) 100 mg twice a day.  Use MiraLax  (over the counter) for  constipation as needed.   Diet - low sodium heart healthy  Complete by: As directed    Diet - low sodium heart healthy   Complete by: As directed    Flush IV access with Sodium Chloride  0.9% and Heparin  10 units/ml or 100 units/ml   Complete by: As directed    Home infusion instructions - [facility]   Complete by: As directed    Instructions: Flush IV access with Sodium Chloride  0.9% and Heparin  10units/ml or 100units/ml   Change dressing on IV access line: Weekly and PRN   Instructions Cath Flo 2mg : Administer for PICC Line occlusion and as ordered by physician for other access device   [facility] pharmacist to adjust dose for: Vancomycin , Aminoglycosides and other anti-infective therapies as requested by physician   Increase activity slowly as tolerated   Complete by: As directed    Weight bearing as tolerated with assist device (walker, cane, etc) as directed, use it as long as suggested by your surgeon or therapist, typically at least 4-6 weeks.   Increase activity slowly as tolerated   Complete by: As directed    Weight bearing as tolerated with assist device (walker, cane, etc) as directed, use it as long as suggested by your surgeon or therapist, typically at least 4-6 weeks.   Method of administration may be changed at the discretion of home infusion pharmacist based upon assessment of the patient and/or caregiver's ability to self-administer the medication ordered   Complete by: As directed    Post-operative opioid taper instructions:   Complete by: As directed    POST-OPERATIVE OPIOID TAPER INSTRUCTIONS: It is important to wean off of your opioid medication as soon as possible. If you do not need pain medication after your surgery it is ok to stop day one. Opioids include: Codeine, Hydrocodone(Norco, Vicodin), Oxycodone (Percocet, oxycontin ) and hydromorphone  amongst others.  Long term and even short term use of opiods can cause: Increased pain  response Dependence Constipation Depression Respiratory depression And more.  Withdrawal symptoms can include Flu like symptoms Nausea, vomiting And more Techniques to manage these symptoms Hydrate well Eat regular healthy meals Stay active Use relaxation techniques(deep breathing, meditating, yoga) Do Not substitute Alcohol  to help with tapering If you have been on opioids for less than two weeks and do not have pain than it is ok to stop all together.  Plan to wean off of opioids This plan should start within one week post op of your joint replacement. Maintain the same interval or time between taking each dose and first decrease the dose.  Cut the total daily intake of opioids by one tablet each day Next start to increase the time between doses. The last dose that should be eliminated is the evening dose.      Post-operative opioid taper instructions:   Complete by: As directed    POST-OPERATIVE OPIOID TAPER INSTRUCTIONS: It is important to wean off of your opioid medication as soon as possible. If you do not need pain medication after your surgery it is ok to stop day one. Opioids include: Codeine, Hydrocodone(Norco, Vicodin), Oxycodone (Percocet, oxycontin ) and hydromorphone  amongst others.  Long term and even short term use of opiods can cause: Increased pain response Dependence Constipation Depression Respiratory depression And more.  Withdrawal symptoms can include Flu like symptoms Nausea, vomiting And more Techniques to manage these symptoms Hydrate well Eat regular healthy meals Stay active Use relaxation techniques(deep breathing, meditating, yoga) Do Not substitute Alcohol  to help with tapering If you have been on opioids for less than two weeks and  do not have pain than it is ok to stop all together.  Plan to wean off of opioids This plan should start within one week post op of your joint replacement. Maintain the same interval or time between taking  each dose and first decrease the dose.  Cut the total daily intake of opioids by one tablet each day Next start to increase the time between doses. The last dose that should be eliminated is the evening dose.      TED hose   Complete by: As directed    Use stockings (TED hose) for 2 weeks on both leg(s).  You may remove them at night for sleeping.        Follow-up Information     Ernie Cough, MD. Schedule an appointment as soon as possible for a visit in 2 week(s).   Specialty: Orthopedic Surgery Contact information: [address] [place] [address] [place] [address] [phone]                  Signed: Rosina JONELLE Calin 12/08/2023, 9:04 AM

## 2023-12-09 DIAGNOSIS — H0279 Other degenerative disorders of eyelid and periocular area: Secondary | ICD-10-CM | POA: Diagnosis not present

## 2023-12-09 DIAGNOSIS — H02132 Senile ectropion of right lower eyelid: Secondary | ICD-10-CM | POA: Diagnosis not present

## 2023-12-09 DIAGNOSIS — Z09 Encounter for follow-up examination after completed treatment for conditions other than malignant neoplasm: Secondary | ICD-10-CM | POA: Diagnosis not present

## 2023-12-09 DIAGNOSIS — M71162 Other infective bursitis, left knee: Secondary | ICD-10-CM | POA: Diagnosis not present

## 2023-12-09 DIAGNOSIS — H02135 Senile ectropion of left lower eyelid: Secondary | ICD-10-CM | POA: Diagnosis not present

## 2023-12-09 DIAGNOSIS — H02532 Eyelid retraction right lower eyelid: Secondary | ICD-10-CM | POA: Diagnosis not present

## 2023-12-09 DIAGNOSIS — L918 Other hypertrophic disorders of the skin: Secondary | ICD-10-CM | POA: Diagnosis not present

## 2023-12-09 DIAGNOSIS — H02535 Eyelid retraction left lower eyelid: Secondary | ICD-10-CM | POA: Diagnosis not present

## 2023-12-10 ENCOUNTER — Encounter (HOSPITAL_BASED_OUTPATIENT_CLINIC_OR_DEPARTMENT_OTHER): Admitting: Internal Medicine

## 2023-12-10 DIAGNOSIS — L03116 Cellulitis of left lower limb: Secondary | ICD-10-CM | POA: Diagnosis not present

## 2023-12-10 DIAGNOSIS — S80811A Abrasion, right lower leg, initial encounter: Secondary | ICD-10-CM | POA: Diagnosis not present

## 2023-12-10 DIAGNOSIS — L97818 Non-pressure chronic ulcer of other part of right lower leg with other specified severity: Secondary | ICD-10-CM | POA: Diagnosis not present

## 2023-12-10 DIAGNOSIS — I87311 Chronic venous hypertension (idiopathic) with ulcer of right lower extremity: Secondary | ICD-10-CM | POA: Diagnosis not present

## 2023-12-11 DIAGNOSIS — S81801A Unspecified open wound, right lower leg, initial encounter: Secondary | ICD-10-CM | POA: Diagnosis not present

## 2023-12-14 ENCOUNTER — Encounter: Payer: Self-pay | Admitting: Internal Medicine

## 2023-12-14 DIAGNOSIS — M71162 Other infective bursitis, left knee: Secondary | ICD-10-CM | POA: Diagnosis not present

## 2023-12-14 DIAGNOSIS — I10 Essential (primary) hypertension: Secondary | ICD-10-CM | POA: Diagnosis not present

## 2023-12-14 DIAGNOSIS — T8454XA Infection and inflammatory reaction due to internal left knee prosthesis, initial encounter: Secondary | ICD-10-CM | POA: Diagnosis not present

## 2023-12-14 DIAGNOSIS — B9561 Methicillin susceptible Staphylococcus aureus infection as the cause of diseases classified elsewhere: Secondary | ICD-10-CM | POA: Diagnosis not present

## 2023-12-14 DIAGNOSIS — G4733 Obstructive sleep apnea (adult) (pediatric): Secondary | ICD-10-CM | POA: Diagnosis not present

## 2023-12-14 DIAGNOSIS — J45909 Unspecified asthma, uncomplicated: Secondary | ICD-10-CM | POA: Diagnosis not present

## 2023-12-14 DIAGNOSIS — Z792 Long term (current) use of antibiotics: Secondary | ICD-10-CM | POA: Diagnosis not present

## 2023-12-14 DIAGNOSIS — I4510 Unspecified right bundle-branch block: Secondary | ICD-10-CM | POA: Diagnosis not present

## 2023-12-14 DIAGNOSIS — M1711 Unilateral primary osteoarthritis, right knee: Secondary | ICD-10-CM | POA: Diagnosis not present

## 2023-12-14 DIAGNOSIS — Z5181 Encounter for therapeutic drug level monitoring: Secondary | ICD-10-CM | POA: Diagnosis not present

## 2023-12-14 DIAGNOSIS — L03116 Cellulitis of left lower limb: Secondary | ICD-10-CM | POA: Diagnosis not present

## 2023-12-15 NOTE — Progress Notes (Signed)
 Infection unrelated to previous surgery    Patient had fully healed his surgical incision and then developed the acute onset of lower extremity cellulitis and subsequently septic prepatellar bursitis necessitating this procedure

## 2023-12-17 ENCOUNTER — Other Ambulatory Visit: Payer: Self-pay

## 2023-12-17 ENCOUNTER — Telehealth: Payer: Self-pay

## 2023-12-17 ENCOUNTER — Encounter (HOSPITAL_BASED_OUTPATIENT_CLINIC_OR_DEPARTMENT_OTHER): Admitting: Internal Medicine

## 2023-12-17 ENCOUNTER — Ambulatory Visit: Payer: Self-pay | Admitting: Infectious Diseases

## 2023-12-17 VITALS — BP 124/71 | HR 61 | Temp 98.0°F | Wt 197.0 lb

## 2023-12-17 DIAGNOSIS — M71162 Other infective bursitis, left knee: Secondary | ICD-10-CM | POA: Diagnosis not present

## 2023-12-17 DIAGNOSIS — I87311 Chronic venous hypertension (idiopathic) with ulcer of right lower extremity: Secondary | ICD-10-CM | POA: Diagnosis not present

## 2023-12-17 DIAGNOSIS — L97818 Non-pressure chronic ulcer of other part of right lower leg with other specified severity: Secondary | ICD-10-CM | POA: Diagnosis not present

## 2023-12-17 DIAGNOSIS — Z79899 Other long term (current) drug therapy: Secondary | ICD-10-CM

## 2023-12-17 DIAGNOSIS — S80811D Abrasion, right lower leg, subsequent encounter: Secondary | ICD-10-CM

## 2023-12-17 DIAGNOSIS — L97919 Non-pressure chronic ulcer of unspecified part of right lower leg with unspecified severity: Secondary | ICD-10-CM

## 2023-12-17 DIAGNOSIS — A4901 Methicillin susceptible Staphylococcus aureus infection, unspecified site: Secondary | ICD-10-CM

## 2023-12-17 DIAGNOSIS — Z452 Encounter for adjustment and management of vascular access device: Secondary | ICD-10-CM | POA: Diagnosis not present

## 2023-12-17 DIAGNOSIS — S80811A Abrasion, right lower leg, initial encounter: Secondary | ICD-10-CM | POA: Diagnosis not present

## 2023-12-17 DIAGNOSIS — L03116 Cellulitis of left lower limb: Secondary | ICD-10-CM | POA: Diagnosis not present

## 2023-12-17 DIAGNOSIS — I83019 Varicose veins of right lower extremity with ulcer of unspecified site: Secondary | ICD-10-CM

## 2023-12-17 NOTE — Progress Notes (Addendum)
 Patient Active Problem List   Diagnosis Date Noted   PICC (peripherally inserted central catheter) in place 12/17/2023   Prepatellar bursitis of left knee 11/26/2023   Septic prepatellar bursitis of left knee 11/26/2023   Arthrofibrosis of knee joint, left 08/18/2023   History of revision of total shoulder arthroplasty 05/09/2022   S/P revision of total knee, left 12/05/2021   Peripheral edema 09/17/2017   Eosinophilia 06/11/2016   Thrombocytopenia (HCC) 10/31/2014   Medication management 10/19/2013   Borderline anemia 10/19/2013   DJD (degenerative joint disease) 02/17/2013   Visit for preventive health examination 10/12/2011   Skin lesion of right leg 09/28/2010   Preventative health care 09/24/2010   KNEE REPLACEMENT, LEFT, HX OF 03/21/2010   OBESITY 09/17/2009   BUNDLE BRANCH BLOCK, RIGHT 09/17/2009   OSTEOARTHRITIS, KNEES, 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   SHOULDER IMPINGEMENT SYNDROME 03/14/2009   UNSPECIFIED THROMBOCYTOPENIA 12/12/2008   ANEMIA, MILD 09/22/2008   PROSTATE SPECIFIC ANTIGEN, ELEVATED 09/22/2008   KNEE PAIN, BILATERAL 07/28/2008   DEPRESSION 02/09/2007   Obstructive sleep apnea 02/09/2007   Essential hypertension 02/09/2007   Asthma, mild intermittent, well-controlled 02/09/2007   Chronic insomnia 02/09/2007    Patient's Medications  New Prescriptions   No medications on file  Previous Medications   AMLODIPINE -BENAZEPRIL  (LOTREL) 5-10 MG CAPSULE    TAKE 1 CAPSULE BY MOUTH EVERY DAY   AMOXICILLIN  (AMOXIL ) 500 MG TABLET    TAKE 4 TABLETS BY MOUTH 1 HOUR PRIOR TO DENTAL WORK   ARTIFICIAL TEAR SOLUTION (SOOTHE XP) SOLN    Place 1 drop into both eyes 3 (three) times daily as needed (for dryness).   ASPIRIN  81 MG CHEWABLE TABLET    Chew 1 tablet (81 mg total) by mouth 2 (two) times daily for 28 days.   CEFAZOLIN  (ANCEF ) IVPB    Inject 2 g into the vein every 8 (eight) hours for 17 days. Indication:  septic bursitis First Dose: No Last Day of Therapy:   12/17/2023 Labs - Once weekly:  CBC/D and BMP, Labs - Once weekly: ESR and CRP Method of administration: IV Push Method of administration may be changed at the discretion of home infusion pharmacist based upon assessment of the patient and/or caregiver's ability to self-administer the medication ordered.   CLOBETASOL (TEMOVATE) 0.05 % EXTERNAL SOLUTION    Apply 1 Application topically 2 (two) times daily as needed (for itching- as directed).   ESCITALOPRAM  (LEXAPRO ) 10 MG TABLET    Take 1 tablet (10 mg total) by mouth daily.   FEXOFENADINE (ALLEGRA) 180 MG TABLET    Take 180 mg by mouth at bedtime.   FLUTICASONE  (FLONASE ) 50 MCG/ACT NASAL SPRAY    SPRAY 2 SPRAYS INTO EACH NOSTRIL EVERY DAY   IBUPROFEN  (ADVIL ) 800 MG TABLET    Take 800 mg by mouth in the morning and at bedtime.   IPRATROPIUM-ALBUTEROL  (COMBIVENT  RESPIMAT) 20-100 MCG/ACT AERS RESPIMAT    Inhale 1 puff up to four times daily as needed   OLOPATADINE  HCL (PATADAY ) 0.7 % SOLN    Place 1 drop into both eyes daily as needed (for allergies).   TADALAFIL (CIALIS) 5 MG TABLET    Take 5 mg by mouth at bedtime.   WIXELA INHUB 100-50 MCG/ACT AEPB    TAKE 1 PUFF BY MOUTH TWICE A DAY  Modified Medications   No medications on file  Discontinued Medications   No medications on file    Subjective: Discussed the use of AI scribe software for clinical  note transcription with the patient, who gave verbal consent to proceed.  73 Y O male with prior h/o Asthma, Anxiety/Depression, HTN, b/l knee replacement, rt reverse shoulder arthroplasty, OSA on CPAP who is here for HFU after recent admission 8/28-9/2 for left knee prepatellar bursitis. Discharged on 9/2 to complete 3 weeks of IV cefazolin  through 9/18  9/18 Getting IV cefazolin  with no concerns related to PICC or antibiotics. No concerns in left knee and is ambulatory. Saw Dr Ernie on 9/10 and next fu in 3 months.   He has LLE edema for which he has compression bandage on, have followed  vascular in the past.  Have an appt with wound care later today for Rt leg wound. He showed me pic of  wound rt leg last Thursday and it is healing . No concerns.   Review of Systems: all systems reviewed with pertinent positives and negatives as listed above   Past Medical History:  Diagnosis Date   Allergy     Anxiety    Arthritis    knees   Asthma    Depression    Dysrhythmia    RBBB   Elevated PSA    us  and bx negative 2011   Hypertension    nl cardiolite in 1998   Pneumonia 2005   right hospitalized    Rhinosinusitis    Sleep apnea    wears CPAP   Past Surgical History:  Procedure Laterality Date   COLONOSCOPY  03/09/2013   ECTROPION REPAIR Left    2023   EYE SURGERY Left 04/08/2022   to bring lower eyelid up   FACIAL RECONSTRUCTION SURGERY     1992   HERNIA REPAIR     IRRIGATION AND DEBRIDEMENT KNEE Left 11/26/2023   Procedure: IRRIGATION AND DEBRIDEMENT KNEE;  Surgeon: Ernie Cough, MD;  Location: [facility];  Service: Orthopedics;  Laterality: Left;   KNEE ARTHROTOMY Left 08/18/2023   Procedure: ARTHROTOMY, KNEE;  Surgeon: Ernie Cough, MD;  Location: [facility];  Service: Orthopedics;  Laterality: Left;  Left knee open scar debridment   NASAL SINUS SURGERY     REPLACEMENT TOTAL KNEE  01/2010   left; Dr Ernie; RIght knee 06-2010   REVERSE SHOULDER ARTHROPLASTY Right 03/14/2022   Procedure: REVERSE SHOULDER ARTHROPLASTY WITH REMOVAL OF ANATOMIC ARTHROPLASTY;  Surgeon: Melita Drivers, MD;  Location: [facility];  Service: Orthopedics;  Laterality: Right;   right shoulder arthroplasty     2018   shoulder atrhroscopic surgery  2011   Right   TOTAL KNEE REVISION Left 12/05/2021   Procedure: TOTAL KNEE REVISION;  Surgeon: Ernie Cough, MD;  Location: [facility];  Service: Orthopedics;  Laterality: Left;   TOTAL SHOULDER REVISION Right 05/09/2022   Procedure: Revision Right Reverse Shoulder Arthroplasty;  Surgeon: Melita Drivers, MD;  Location: [facility];  Service: Orthopedics;  Laterality:  Right;    VENTRAL HERNIA REPAIR  03/2007   rerepair winter 2010   umbilical per pt    Social History   Tobacco Use   Smoking status: Never   Smokeless tobacco: Never  Vaping Use   Vaping status: Never Used  Substance Use Topics   Alcohol  use: No    Comment: Quit drinking ETOH 1986   Drug use: No    Family History  Problem Relation Age of Onset   Heart failure Father        MV replacement   Myelodysplastic syndrome Father    Asthma Mother    Polycythemia Brother    Asthma  Other    Hypertension Other    Diabetes Maternal Grandmother    Healthy Sister    Healthy Brother    Healthy Brother    Colon cancer Neg Hx    Pancreatic cancer Neg Hx    Stomach cancer Neg Hx    Esophageal cancer Neg Hx    Colon polyps Neg Hx    Rectal cancer Neg Hx     Allergies  Allergen Reactions   Tape Other (See Comments)    Causes bleeding and redness of the skin.SABRA SKIN IS VERY THIN!!   Egg-Derived Products Diarrhea, Nausea Only and Other (See Comments)    Terrible stomach pain    Health Maintenance  Topic Date Due   COVID-19 Vaccine (10 - 2025-26 season) 02/07/2024   Medicare Annual Wellness (AWV)  07/05/2024   Colonoscopy  11/07/2027   DTaP/Tdap/Td (4 - Td or Tdap) 12/12/2033   Pneumococcal Vaccine: 50+ Years  Completed   Influenza Vaccine  Completed   Hepatitis C Screening  Completed   Zoster Vaccines- Shingrix   Completed   HPV VACCINES  Aged Out   Meningococcal B Vaccine  Aged Out    Objective: BP 124/71   Pulse 61   Temp 98 F (36.7 C) (Oral)   Wt 197 lb (89.4 kg)   SpO2 96%   BMI 33.81 kg/m    Physical Exam Constitutional:      Appearance: Normal appearance.  HENT:     Head: Normocephalic and atraumatic.      Mouth: Mucous membranes are moist.  Eyes:    Conjunctiva/sclera: Conjunctivae normal.     Pupils: Pupils are equal, round, and b/l symmetrical    Cardiovascular:     Rate and Rhythm: Normal rate and regular rhythm.     Heart sounds:  s1s2  Pulmonary:     Effort: Pulmonary effort is normal.     Breath sounds: Normal breath sounds.   Abdominal:     General: Non distended     Palpations: soft.   Musculoskeletal:        General: Normal range of motion.   Wound beneath rt leg taken from patient's phone    Rt leg    Left knee with mild swelling but no other signs of infection like warmth, tenderness, erythema, good ROM. ambulatory   Skin:    General: Skin is warm and dry.     Comments: PICC line OK with no signs of infection   Neurological:     General: grossly non focal     Mental Status: awake, alert and oriented to person, place, and time.   Psychiatric:        Mood and Affect: Mood normal.   Lab Results Lab Results  Component Value Date   WBC 8.9 11/28/2023   HGB 11.7 (L) 11/28/2023   HCT 35.2 (L) 11/28/2023   MCV 106.3 (H) 11/28/2023   PLT 159 11/28/2023    Lab Results  Component Value Date   CREATININE 0.93 11/27/2023   BUN 16 11/27/2023   NA 132 (L) 11/27/2023   K 4.8 11/27/2023   CL 99 11/27/2023   CO2 22 11/27/2023    Lab Results  Component Value Date   ALT 18 02/11/2023   AST 19 02/11/2023   ALKPHOS 68 02/11/2023   BILITOT 0.9 02/11/2023    Lab Results  Component Value Date   CHOL 175 02/11/2023   HDL 46.40 02/11/2023   LDLCALC 114 (H) 02/11/2023   TRIG 76.0 02/11/2023  CHOLHDL 4 02/11/2023   No results found for: LABRPR, RPRTITER No results found for: HIV1RNAQUANT, HIV1RNAVL, CD4TABS   Microbiology Results for orders placed or performed during the hospital encounter of 11/25/23  Body fluid culture w Gram Stain     Status: None   Collection Time: 11/26/23  6:45 AM   Specimen: Synovium; Body Fluid  Result Value Ref Range Status   Specimen Description   Final    SYNOVIAL LEFT KNEE Performed at [facility], [address], [place], [place] [address]    Special Requests   Final    Normal Performed at [facility],  [address] [place], [place] [address]    Gram Stain   Final    FEW WBC PRESENT, PREDOMINANTLY PMN NO ORGANISMS SEEN    Culture   Final    RARE STAPHYLOCOCCUS AUREUS CRITICAL RESULT CALLED TO, READ BACK BY AND VERIFIED WITH: BHANDERI RN, AT 1100 11/27/23 DV REGARDING CULTURE GROWTH Performed at [facility], [address], [place], [place] [address]    Report Status 11/29/2023 FINAL  Final   Organism ID, Bacteria STAPHYLOCOCCUS AUREUS  Final      Susceptibility   Staphylococcus aureus - MIC*    CIPROFLOXACIN  <=0.5 SENSITIVE Sensitive     ERYTHROMYCIN  >=8 RESISTANT Resistant     GENTAMICIN <=0.5 SENSITIVE Sensitive     OXACILLIN 0.5 SENSITIVE Sensitive     TETRACYCLINE >=16 RESISTANT Resistant     VANCOMYCIN  1 SENSITIVE Sensitive     TRIMETH/SULFA <=10 SENSITIVE Sensitive     CLINDAMYCIN  >=8 RESISTANT Resistant     RIFAMPIN <=0.5 SENSITIVE Sensitive     Inducible Clindamycin  NEGATIVE Sensitive     LINEZOLID 2 SENSITIVE Sensitive     * RARE STAPHYLOCOCCUS AUREUS  Culture, blood (Routine X 2) w Reflex to ID Panel     Status: None   Collection Time: 11/28/23  8:21 AM   Specimen: BLOOD LEFT ARM  Result Value Ref Range Status   Specimen Description   Final    BLOOD LEFT ARM Performed at [facility], [address] [place], [place] [address]    Special Requests   Final    BOTTLES DRAWN AEROBIC AND ANAEROBIC Blood Culture results may not be optimal due to an inadequate volume of blood received in culture bottles Performed at [facility], [address] [place], [place] [address]    Culture   Final    NO GROWTH 5 DAYS Performed at [facility], [address], [place], [place] [address]    Report Status 12/03/2023 FINAL  Final  Culture, blood (Routine X 2) w Reflex to ID Panel     Status: None   Collection Time: 11/28/23  8:21 AM   Specimen: BLOOD LEFT HAND  Result Value Ref Range Status   Specimen Description   Final    BLOOD LEFT  HAND Performed at [facility], [address] [place], [place] [address]    Special Requests   Final    BOTTLES DRAWN AEROBIC AND ANAEROBIC Blood Culture results may not be optimal due to an inadequate volume of blood received in culture bottles Performed at [facility], [address] [place], [place] [address]    Culture   Final    NO GROWTH 5 DAYS Performed at [facility], [address] [place], [place] [address]    Report Status 12/03/2023 FINAL  Final  Imaging  DG CHEST PORT 1 VIEW Result Date: 11/28/2023 EXAM: 1 VIEW XRAY OF THE CHEST 11/28/2023 01:44:00 PM COMPARISON: 11/11/2017 CLINICAL HISTORY: S/p PICC line placement FINDINGS: LUNGS AND PLEURA: Small-to-moderate layer in right pleural effusion. Probable small left pleural effusion. Right greater than left base airspace disease. No congestive heart failure. HEART AND MEDIASTINUM: Mild cardiomegaly. 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 Mild limitation secondary to patient body habitus. LINES AND TUBES: Left upper extremity PICC in place with tip overlying the expected area of the lower SVC. IMPRESSION: 1. Small-to-moderate right pleural effusion and probable small left pleural effusion. 2. Right greater than left base airspace disease. At the left lung base, likely atelectasis. At the right lung base, favor pneumonia or aspiration. 3. Mild cardiomegaly. No congestive heart failure. 4. Left upper extremity PICC in place with tip overlying the expected area of the lower SVC. No pneumothorax. Electronically signed by: Rockey Kilts MD 11/28/2023 02:35 PM EDT RP Workstation: [id number]   US  EKG SITE RITE Result Date: 11/28/2023 If Site Rite image not attached, placement could not be confirmed due to current cardiac rhythm.   Assessment/Plan # Prepatellar bursitis in the setting of left TKA- MSSA - 11/26/23 left knee synovial fluid cx MSSA Per Dr Aureliano  note -Patient had history of left TKA revision, most recently for finding of excessive parapatellar scarring and patella clunk and underwent I&D of knee on 5/25.  He was seen in Ortho office on 8/22 with Dr. Ernie and noted's doing well at that time.  2 days later patient went for a 30 mile bike ride and noticed he was having chills and left knee was swollen.  He had a fever 102.  At that time he also noted that he had been on Keflex for right lower extremity wounds last dose on Tuesday.  Knee was aspirated on 8/28.  Aspiration showed purulent fluid.  Taken to the OR for excisional and nonexcisional debridement of left knee prepatellar septic bursitis, per my communication with orthopedics they did not aspirate or open the knee capsule, no evidence of deep infection.  No cultures obtained in the OR  Plan - complete last dose of IV cefazolin  9/18 - pull PICC tomorrow  - fu in 3 weeks to monitor off antibiotics   # Medication monitoring  -9/8 CBC and BMP unremarkable, ESR 6, CRP 5  # PICC - no concerns   # RT leg venous wound  - follow up with wound care and vascular  I spent 40 minutes involved in face-to-face and non-face-to-face activities for this patient on the day of the visit. Professional time spent includes the following activities: Preparing to see the patient (review of tests), Obtaining and reviewing separately obtained history (discharge record 9/2, ID notes, OR note), Performing a medically appropriate examination and evaluation, Ordering medications, referring and communicating with other health care professionals, Documenting clinical information in the EMR, Independently interpreting results (not separately reported), Communicating results to the patient, Counseling and educating the patient and Care coordination (not separately reported).   Of note, portions of this note may have been created with voice recognition software. While this note has been edited for accuracy, occasional  wrong-word or 'sound-a-like' substitutions may have occurred due to the inherent limitations of voice recognition software.   Annalee Joseph, MD [facility] 12/17/2023, 9:00 AM

## 2023-12-17 NOTE — Telephone Encounter (Signed)
 Per Dr. Dea okay for picc to be removed by [facility] tomorrow. Pt should complete antbx this evening. Community message sent to [employer] team.  Lorenda CHRISTELLA Code, RMA

## 2023-12-18 DIAGNOSIS — M71162 Other infective bursitis, left knee: Secondary | ICD-10-CM | POA: Diagnosis not present

## 2023-12-18 DIAGNOSIS — I10 Essential (primary) hypertension: Secondary | ICD-10-CM | POA: Diagnosis not present

## 2023-12-18 DIAGNOSIS — M1711 Unilateral primary osteoarthritis, right knee: Secondary | ICD-10-CM | POA: Diagnosis not present

## 2023-12-18 DIAGNOSIS — J45909 Unspecified asthma, uncomplicated: Secondary | ICD-10-CM | POA: Diagnosis not present

## 2023-12-18 DIAGNOSIS — I4510 Unspecified right bundle-branch block: Secondary | ICD-10-CM | POA: Diagnosis not present

## 2023-12-18 DIAGNOSIS — G4733 Obstructive sleep apnea (adult) (pediatric): Secondary | ICD-10-CM | POA: Diagnosis not present

## 2023-12-18 DIAGNOSIS — T8454XA Infection and inflammatory reaction due to internal left knee prosthesis, initial encounter: Secondary | ICD-10-CM | POA: Diagnosis not present

## 2023-12-18 DIAGNOSIS — L03116 Cellulitis of left lower limb: Secondary | ICD-10-CM | POA: Diagnosis not present

## 2023-12-18 DIAGNOSIS — B9561 Methicillin susceptible Staphylococcus aureus infection as the cause of diseases classified elsewhere: Secondary | ICD-10-CM | POA: Diagnosis not present

## 2023-12-21 DIAGNOSIS — I10 Essential (primary) hypertension: Secondary | ICD-10-CM | POA: Diagnosis not present

## 2023-12-21 DIAGNOSIS — T8454XA Infection and inflammatory reaction due to internal left knee prosthesis, initial encounter: Secondary | ICD-10-CM | POA: Diagnosis not present

## 2023-12-21 DIAGNOSIS — J45909 Unspecified asthma, uncomplicated: Secondary | ICD-10-CM | POA: Diagnosis not present

## 2023-12-21 DIAGNOSIS — M1711 Unilateral primary osteoarthritis, right knee: Secondary | ICD-10-CM | POA: Diagnosis not present

## 2023-12-21 DIAGNOSIS — L03116 Cellulitis of left lower limb: Secondary | ICD-10-CM | POA: Diagnosis not present

## 2023-12-21 DIAGNOSIS — M71162 Other infective bursitis, left knee: Secondary | ICD-10-CM | POA: Diagnosis not present

## 2023-12-21 DIAGNOSIS — G4733 Obstructive sleep apnea (adult) (pediatric): Secondary | ICD-10-CM | POA: Diagnosis not present

## 2023-12-21 DIAGNOSIS — I4510 Unspecified right bundle-branch block: Secondary | ICD-10-CM | POA: Diagnosis not present

## 2023-12-21 DIAGNOSIS — B9561 Methicillin susceptible Staphylococcus aureus infection as the cause of diseases classified elsewhere: Secondary | ICD-10-CM | POA: Diagnosis not present

## 2023-12-23 DIAGNOSIS — G4733 Obstructive sleep apnea (adult) (pediatric): Secondary | ICD-10-CM | POA: Diagnosis not present

## 2023-12-25 NOTE — Progress Notes (Signed)
 Cardiology Office Note:   Date:  12/30/2023  ID:  [name], DOB 10-28-1950, MRN [id number] PCP:  Charlett Apolinar POUR, MD  [facility] Providers Cardiologist:  Wendel Haws, MD Referring MD: Charlett Apolinar POUR, MD  Chief Complaint/Reason for Referral: Aortic valve calcification ASSESSMENT:    1. Aortic valve calcification   2. Aortic root dilatation   3. Essential hypertension   4. Bifascicular block   5. BMI 38.0-38.9,adult     PLAN:   In order of problems listed above: Aortic valve calcification: No evidence of stenosis by echocardiogram.  Continue blood pressure and lipid control.  Aortic root dilatation: Seen on echocardiogram.  Will obtain CTA to evaluate further. Hypertension: Continue amlodipine /benazepril  5/10 mg daily.  BP is well-controlled today. Bifascicular block: Continue to monitor. Elevated BMI: Hemoglobin A1c within the year was 5.5.            Dispo:  Return in about 1 year (around 12/29/2024).       I spent 42 minutes reviewing all clinical data during and prior to this visit including all relevant imaging studies, laboratories, clinical information from other health systems and prior notes from both Cardiology and other specialties, interviewing the patient, conducting a complete physical examination, and coordinating care in order to formulate a comprehensive and personalized evaluation and treatment plan.   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 The patient is a 73 year old male with above listed medical problems referred for recommendations regarding aortic sclerosis with aortic valve calcification seen on echocardiogram with associated aortic root dilatation.  He was discharged from the  hospital in September due to treatment of septic prepatellar bursitis of the left knee.  He underwent I&D and was treated with perioperative antibiotics.  No shortness of breath, chest discomfort, lightheadedness, or blacking out spells. They have a history of asthma, which is well-controlled, and they engage in regular physical activity, including cycling and gym workouts, to maintain endurance.  They experience lower leg edema, more pronounced in the right leg, which resolves overnight. They wear compression socks to manage the swelling. A vascular specialist evaluated them, and an ultrasound in January showed vein reflux.       Current Medications: Current Meds  Medication Sig   amLODipine -benazepril  (LOTREL) 5-10 MG capsule TAKE 1 CAPSULE BY MOUTH EVERY DAY (Patient taking differently: Take 1 capsule by mouth at bedtime.)   amoxicillin  (AMOXIL ) 500 MG tablet TAKE 4 TABLETS BY MOUTH 1 HOUR PRIOR TO DENTAL WORK   Artificial Tear Solution (SOOTHE XP) SOLN Place 1 drop into both eyes 3 (three) times daily as needed (for dryness).   aspirin  81 MG chewable tablet Chew 1 tablet (81 mg total) by mouth 2 (two) times daily for 28 days.   clobetasol (TEMOVATE) 0.05 % external solution Apply 1 Application topically 2 (two) times daily as needed (for itching- as directed).   escitalopram  (LEXAPRO ) 10 MG tablet Take 1 tablet (10 mg total) by mouth daily. (Patient taking differently: Take 10 mg by mouth at bedtime.)   fexofenadine (ALLEGRA) 180 MG tablet Take 180 mg by mouth at bedtime.   fluticasone  (FLONASE ) 50 MCG/ACT nasal spray SPRAY 2 SPRAYS INTO EACH NOSTRIL EVERY DAY (Patient taking differently: Place 1-2 sprays  into both nostrils in the morning and at bedtime.)   ibuprofen  (ADVIL ) 800 MG tablet Take 800 mg by mouth in the morning and at bedtime.   Ipratropium-Albuterol  (COMBIVENT  RESPIMAT) 20-100 MCG/ACT AERS respimat Inhale 1 puff up to four times daily as needed (Patient taking differently:  Inhale 1 puff into the lungs every 6 (six) hours as needed for wheezing or shortness of breath.)   Olopatadine  HCl (PATADAY ) 0.7 % SOLN Place 1 drop into both eyes daily as needed (for allergies).   tadalafil (CIALIS) 5 MG tablet Take 5 mg by mouth at bedtime.   WIXELA INHUB 100-50 MCG/ACT AEPB TAKE 1 PUFF BY MOUTH TWICE A DAY (Patient taking differently: Inhale 1 puff into the lungs in the morning and at bedtime.)     Review of Systems:   Please see the history of present illness.    All other systems reviewed and are negative.     EKGs/Labs/Other Test Reviewed:   EKG: 2025 sinus rhythm with first-degree AV block and right bundle branch block  EKG Interpretation Date/Time:    Ventricular Rate:    PR Interval:    QRS Duration:    QT Interval:    QTC Calculation:   R Axis:      Text Interpretation:          CARDIAC STUDIES: Refer to CV Procedures and Imaging Tabs   Risk Assessment/Calculations:          Physical Exam:   VS:  BP 128/66   Pulse (!) 51   Ht 5' 4 (1.626 m)   Wt 197 lb 6.4 oz (89.5 kg)   SpO2 97%   BMI 33.88 kg/m        Wt Readings from Last 3 Encounters:  12/30/23 197 lb 6.4 oz (89.5 kg)  12/17/23 197 lb (89.4 kg)  11/25/23 200 lb 9.9 oz (91 kg)      GENERAL:  No apparent distress, AOx3 HEENT:  No carotid bruits, +2 carotid impulses, no scleral icterus CAR: RRR no murmurs, gallops, rubs, or thrills RES:  Clear to auscultation bilaterally ABD:  Soft, nontender, nondistended, positive bowel sounds x 4 VASC:  +2 radial pulses, +2 carotid pulses NEURO:  CN 2-12 grossly intact; motor and sensory grossly intact PSYCH:  No active depression or anxiety EXT: +2 edema right lower extremity without ecchymosis, or cyanosis  Signed, Denman Pichardo K Ariany Kesselman, MD  12/30/2023 3:27 PM    [facility] [address] [place], [place], [place]  [address] Phone: [phone]; Fax: [phone]   Note:  This document was prepared using Dragon voice  recognition software and may include unintentional dictation errors.

## 2023-12-27 DIAGNOSIS — I83019 Varicose veins of right lower extremity with ulcer of unspecified site: Secondary | ICD-10-CM | POA: Insufficient documentation

## 2023-12-27 DIAGNOSIS — A4901 Methicillin susceptible Staphylococcus aureus infection, unspecified site: Secondary | ICD-10-CM | POA: Insufficient documentation

## 2023-12-30 ENCOUNTER — Encounter: Payer: Self-pay | Admitting: Internal Medicine

## 2023-12-30 ENCOUNTER — Ambulatory Visit: Attending: Internal Medicine | Admitting: Internal Medicine

## 2023-12-30 VITALS — BP 128/66 | HR 51 | Ht 64.0 in | Wt 197.4 lb

## 2023-12-30 DIAGNOSIS — I77810 Thoracic aortic ectasia: Secondary | ICD-10-CM

## 2023-12-30 DIAGNOSIS — I10 Essential (primary) hypertension: Secondary | ICD-10-CM

## 2023-12-30 DIAGNOSIS — Z6838 Body mass index (BMI) 38.0-38.9, adult: Secondary | ICD-10-CM

## 2023-12-30 DIAGNOSIS — I359 Nonrheumatic aortic valve disorder, unspecified: Secondary | ICD-10-CM

## 2023-12-30 DIAGNOSIS — I452 Bifascicular block: Secondary | ICD-10-CM

## 2023-12-30 NOTE — Patient Instructions (Signed)
 Medication Instructions:  No changes  *If you need a refill on your cardiac medications before your next appointment, please call your pharmacy*   Lab Work:  Not needed If you have labs (blood work) drawn today and your tests are completely normal, you will receive your results only by: MyChart Message (if you have MyChart) OR A paper copy in the mail If you have any lab test that is abnormal or we need to change your treatment, we will call you to review the results.   Testing/Procedures: Non-Cardiac CT scanning, (CAT scanning), is a noninvasive, special x-ray that produces cross-sectional images of the body using x-rays and a computer. CT scans help physicians diagnose and treat medical conditions. For some CT exams, a contrast material is used to enhance visibility of the aorta studied. CT scans provide greater clarity and reveal more details than regular x-ray exams.    Follow-Up: At [facility], you and your health needs are our priority.  As part of our continuing mission to provide you with exceptional heart care, we have created designated Provider Care Teams.  These Care Teams include your primary Cardiologist (physician) and Advanced Practice Providers (APPs -  Physician Assistants and Nurse Practitioners) who all work together to provide you with the care you need, when you need it.     Your next appointment:   12 month(s)  The format for your next appointment:   In Person  Provider:   One of our Advanced Practice Providers (APPs): Morse Clause, PA-C  Lamarr Satterfield, NP Miriam Shams, NP  Olivia Pavy, PA-C Josefa Beauvais, NP  Leontine Salen, PA-C Orren Fabry, PA-C  [place], PA-C Ernest Dick, NP  Damien Braver, NP Jon Hails, PA-C  Waddell Donath, PA-C    Dayna Dunn, PA-C  Scott Weaver, PA-C Lum Louis, NP Katlyn West, NP Callie Goodrich, PA-C  Xika Zhao, NP Sheng Haley, PA-C    Kathleen Johnson, PA-C      Other Instructions

## 2024-01-05 ENCOUNTER — Encounter: Payer: Self-pay | Admitting: Podiatry

## 2024-01-05 ENCOUNTER — Ambulatory Visit: Admitting: Podiatry

## 2024-01-05 DIAGNOSIS — B351 Tinea unguium: Secondary | ICD-10-CM | POA: Diagnosis not present

## 2024-01-05 DIAGNOSIS — D696 Thrombocytopenia, unspecified: Secondary | ICD-10-CM

## 2024-01-05 DIAGNOSIS — M79674 Pain in right toe(s): Secondary | ICD-10-CM | POA: Diagnosis not present

## 2024-01-05 DIAGNOSIS — M79675 Pain in left toe(s): Secondary | ICD-10-CM | POA: Diagnosis not present

## 2024-01-05 MED ORDER — FLUOCINONIDE EMULSIFIED BASE 0.05 % EX CREA: 1.0000 | TOPICAL_CREAM | Freq: Two times a day (BID) | CUTANEOUS | 2 refills | Status: AC

## 2024-01-05 NOTE — Progress Notes (Signed)
 This patient presents to the office with chief complaint of long thick painful nails.  Patient says the nails are painful walking and wearing shoes.  This patient is unable to self treat.  This patient is unable to trim his  nails since he is unable to reach his nails.  he presents to the office for preventative foot care services. Patient had eye surgery and leg laceration recently.  General Appearance  Alert, conversant and in no acute stress.  Vascular  Dorsalis pedis and posterior tibial  pulses are palpable  bilaterally.  Capillary return is within normal limits  bilaterally. Temperature is within normal limits  bilaterally.  Neurologic  Senn-Weinstein monofilament wire test within normal limits  bilaterally. Muscle power within normal limits bilaterally.  Nails Thick disfigured discolored nails with subungual debris  from hallux to fifth toes bilaterally. No evidence of bacterial infection or drainage bilaterally.  Orthopedic  No limitations of motion  feet .  No crepitus or effusions noted.  No bony pathology or digital deformities noted.  Skin  normotropic skin with no porokeratosis noted bilaterally.  No signs of infections or ulcers noted.   Skin lesions  B/L.  Onychomycosis  Nails  B/L.  Pain in right toes  Pain in left toes  Debridement of nails both feet followed trimming the nails with dremel tool.  Prescribe lidex cream.  Use as directed.  RTC 10 weeks    Cordella Bold DPM

## 2024-01-12 ENCOUNTER — Ambulatory Visit: Admitting: Infectious Diseases

## 2024-01-20 ENCOUNTER — Ambulatory Visit (HOSPITAL_COMMUNITY)
Admission: RE | Admit: 2024-01-20 | Discharge: 2024-01-20 | Disposition: A | Discharge status: Home / Self Care | Source: Ambulatory Visit | Attending: Internal Medicine | Admitting: Internal Medicine

## 2024-01-20 DIAGNOSIS — I77810 Thoracic aortic ectasia: Secondary | ICD-10-CM | POA: Insufficient documentation

## 2024-01-20 DIAGNOSIS — I359 Nonrheumatic aortic valve disorder, unspecified: Secondary | ICD-10-CM | POA: Insufficient documentation

## 2024-01-20 MED ORDER — IOHEXOL 350 MG/ML SOLN
75.0000 mL | Freq: Once | INTRAVENOUS | Status: AC | PRN
  Administered 2024-01-20: 75 mL via INTRAVENOUS

## 2024-01-23 ENCOUNTER — Ambulatory Visit: Payer: Self-pay | Admitting: Internal Medicine

## 2024-01-26 DIAGNOSIS — H5213 Myopia, bilateral: Secondary | ICD-10-CM | POA: Diagnosis not present

## 2024-01-26 DIAGNOSIS — H524 Presbyopia: Secondary | ICD-10-CM | POA: Diagnosis not present

## 2024-01-26 DIAGNOSIS — H52203 Unspecified astigmatism, bilateral: Secondary | ICD-10-CM | POA: Diagnosis not present

## 2024-01-26 DIAGNOSIS — H43813 Vitreous degeneration, bilateral: Secondary | ICD-10-CM | POA: Diagnosis not present

## 2024-01-26 DIAGNOSIS — H25013 Cortical age-related cataract, bilateral: Secondary | ICD-10-CM | POA: Diagnosis not present

## 2024-01-26 DIAGNOSIS — H2513 Age-related nuclear cataract, bilateral: Secondary | ICD-10-CM | POA: Diagnosis not present

## 2024-01-27 DIAGNOSIS — R972 Elevated prostate specific antigen [PSA]: Secondary | ICD-10-CM | POA: Diagnosis not present

## 2024-02-03 DIAGNOSIS — N5203 Combined arterial insufficiency and corporo-venous occlusive erectile dysfunction: Secondary | ICD-10-CM | POA: Diagnosis not present

## 2024-02-03 DIAGNOSIS — R972 Elevated prostate specific antigen [PSA]: Secondary | ICD-10-CM | POA: Diagnosis not present

## 2024-02-03 DIAGNOSIS — R399 Unspecified symptoms and signs involving the genitourinary system: Secondary | ICD-10-CM | POA: Diagnosis not present

## 2024-02-03 DIAGNOSIS — R3911 Hesitancy of micturition: Secondary | ICD-10-CM | POA: Diagnosis not present

## 2024-02-03 DIAGNOSIS — N401 Enlarged prostate with lower urinary tract symptoms: Secondary | ICD-10-CM | POA: Diagnosis not present

## 2024-02-03 DIAGNOSIS — R3912 Poor urinary stream: Secondary | ICD-10-CM | POA: Diagnosis not present

## 2024-02-16 ENCOUNTER — Telehealth: Payer: Self-pay | Admitting: Infectious Diseases

## 2024-02-16 ENCOUNTER — Ambulatory Visit: Admitting: Infectious Diseases

## 2024-02-16 NOTE — Telephone Encounter (Signed)
 [name] was on the rescheduling list due to Dr. Dea being out of office. While rescheduling, he asked if she could advise if the appt was necessary. He stated this is the second time the appt has been rescheduled and he is feeling better. He stated he finished the antibiotic Sunday. Pt is scheduled 11/25.

## 2024-02-17 ENCOUNTER — Ambulatory Visit: Payer: Self-pay | Admitting: *Deleted

## 2024-02-17 ENCOUNTER — Encounter: Payer: Self-pay | Admitting: Adult Health

## 2024-02-17 ENCOUNTER — Ambulatory Visit: Admitting: Adult Health

## 2024-02-17 VITALS — BP 138/80 | HR 67 | Temp 97.6°F | Ht 64.0 in | Wt 195.0 lb

## 2024-02-17 DIAGNOSIS — S51811A Laceration without foreign body of right forearm, initial encounter: Secondary | ICD-10-CM
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
## 2024-02-17 NOTE — Telephone Encounter (Signed)
 Appt scheduled

## 2024-02-22 DIAGNOSIS — G4733 Obstructive sleep apnea (adult) (pediatric): Secondary | ICD-10-CM | POA: Diagnosis not present

## 2024-02-23 ENCOUNTER — Ambulatory Visit: Admitting: Infectious Diseases

## 2024-02-23 ENCOUNTER — Other Ambulatory Visit: Payer: Self-pay

## 2024-02-23 ENCOUNTER — Encounter: Payer: Self-pay | Admitting: Internal Medicine

## 2024-02-23 ENCOUNTER — Encounter: Payer: Self-pay | Admitting: Infectious Diseases

## 2024-02-23 VITALS — BP 121/67 | HR 54 | Temp 97.7°F | Ht 64.0 in | Wt 191.0 lb

## 2024-02-23 DIAGNOSIS — Z5181 Encounter for therapeutic drug level monitoring: Secondary | ICD-10-CM | POA: Insufficient documentation

## 2024-02-23 DIAGNOSIS — A4901 Methicillin susceptible Staphylococcus aureus infection, unspecified site: Secondary | ICD-10-CM

## 2024-02-23 DIAGNOSIS — M7042 Prepatellar bursitis, left knee: Secondary | ICD-10-CM

## 2024-02-23 DIAGNOSIS — Z7185 Encounter for immunization safety counseling: Secondary | ICD-10-CM | POA: Diagnosis not present

## 2024-02-23 MED ORDER — AZELASTINE HCL 0.1 % NA SOLN: NASAL | 12 refills | Status: AC

## 2024-02-23 NOTE — Telephone Encounter (Signed)
 Azelastine  nasal spray refilled
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
## 2024-02-23 NOTE — Telephone Encounter (Signed)
 Dr. Neysa, The patient is requesting a new prescription for Azelastine  0.1% (137 mcg) nasal spray .  I did not see this medication listed on his medication list or mentioned in his last 2 office visit notes.  Please advise if ok to send in new prescription to his pharmacy.  Thank you.

## 2024-03-11 ENCOUNTER — Encounter (HOSPITAL_BASED_OUTPATIENT_CLINIC_OR_DEPARTMENT_OTHER): Admitting: Internal Medicine

## 2024-03-15 ENCOUNTER — Ambulatory Visit (INDEPENDENT_AMBULATORY_CARE_PROVIDER_SITE_OTHER): Admitting: Podiatry

## 2024-03-15 ENCOUNTER — Other Ambulatory Visit: Payer: Self-pay | Admitting: Internal Medicine

## 2024-03-15 ENCOUNTER — Encounter: Payer: Self-pay | Admitting: Podiatry

## 2024-03-15 DIAGNOSIS — B351 Tinea unguium: Secondary | ICD-10-CM | POA: Diagnosis not present

## 2024-03-15 DIAGNOSIS — D696 Thrombocytopenia, unspecified: Secondary | ICD-10-CM

## 2024-03-15 DIAGNOSIS — M79675 Pain in left toe(s): Secondary | ICD-10-CM | POA: Diagnosis not present

## 2024-03-15 DIAGNOSIS — M79674 Pain in right toe(s): Secondary | ICD-10-CM | POA: Diagnosis not present

## 2024-03-15 NOTE — Progress Notes (Signed)
 This patient presents to the office with chief complaint of long thick painful nails.  Patient says the nails are painful walking and wearing shoes.  This patient is unable to self treat.  This patient is unable to trim his  nails since he is unable to reach his nails.  he presents to the office for preventative foot care services. Patient had eye surgery and leg laceration recently.  General Appearance  Alert, conversant and in no acute stress.  Vascular  Dorsalis pedis and posterior tibial  pulses are palpable  bilaterally.  Capillary return is within normal limits  bilaterally. Temperature is within normal limits  bilaterally.  Neurologic  Senn-Weinstein monofilament wire test within normal limits  bilaterally. Muscle power within normal limits bilaterally.  Nails Thick disfigured discolored nails with subungual debris  from hallux to fifth toes bilaterally. No evidence of bacterial infection or drainage bilaterally.  Orthopedic  No limitations of motion  feet .  No crepitus or effusions noted.  No bony pathology or digital deformities noted.  Skin  normotropic skin with no porokeratosis noted bilaterally.  No signs of infections or ulcers noted.   Skin lesions  B/L.  Onychomycosis  Nails  B/L.  Pain in right toes  Pain in left toes  Debridement of nails both feet followed trimming the nails with dremel tool.    RTC 12 weeks    Cordella Bold DPM

## 2024-03-29 ENCOUNTER — Encounter: Payer: Self-pay | Admitting: Internal Medicine

## 2024-03-29 ENCOUNTER — Telehealth (INDEPENDENT_AMBULATORY_CARE_PROVIDER_SITE_OTHER): Admitting: Internal Medicine

## 2024-03-29 VITALS — Wt 191.0 lb

## 2024-03-29 DIAGNOSIS — Z79899 Other long term (current) drug therapy: Secondary | ICD-10-CM

## 2024-03-29 DIAGNOSIS — J4520 Mild intermittent asthma, uncomplicated: Secondary | ICD-10-CM | POA: Diagnosis not present

## 2024-03-29 DIAGNOSIS — J22 Unspecified acute lower respiratory infection: Secondary | ICD-10-CM

## 2024-03-29 MED ORDER — AZITHROMYCIN 250 MG PO TABS: ORAL_TABLET | ORAL | 0 refills | Status: AC

## 2024-03-29 NOTE — Telephone Encounter (Signed)
 Pt has appt with Dr. Charlett for 3:30pm

## 2024-03-29 NOTE — Progress Notes (Signed)
 " Virtual Visit via Video Note  I connected with [name] on 03/29/2024 at  3:30 PM EST by a video enabled telemedicine application and verified that I am speaking with the correct person using two identifiers. Location patient: home Location provider:work office Persons participating in the virtual visit: patient, provider   Patient aware  of the limitations of evaluation and management by telemedicine and  availability of in person appointments. and agreed to proceed.   HPI: [name] presents for video visit, SDA  Onset 4 days ago of respiratory symptoms that involved upper respiratory congestion but cough becoming worse with second beige yellow-colored mucus without hemoptysis.  He does not think his asthma has flared with wheezing at this time and no acute fever.  Is concerned over the next days especially with the holiday that things could get worse and concerned about pneumonia.  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 His pulmonary doctor will be retiring this month. He has Wixela inhalation 1 puff twice a day 100/50 and Combivent  that he is taking here and there but can take it up to 4 times a day.  ROS: See pertinent positives and negatives per HPI.  Past Medical History:  Diagnosis Date   Allergy     Anxiety    Arthritis    knees   Asthma    Depression    Dysrhythmia    RBBB   Elevated PSA    us  and bx negative 2011   Hypertension    nl cardiolite in 1998   Pneumonia 2005   right hospitalized    Rhinosinusitis    Sleep apnea    wears CPAP    Past Surgical History:  Procedure Laterality Date   COLONOSCOPY  03/09/2013   ECTROPION REPAIR Left    2023   EYE SURGERY Left 04/08/2022   to bring lower eyelid up   FACIAL RECONSTRUCTION SURGERY     1992   HERNIA REPAIR     IRRIGATION AND DEBRIDEMENT KNEE Left 11/26/2023   Procedure: IRRIGATION AND DEBRIDEMENT KNEE;  Surgeon: Ernie Cough, MD;  Location: [facility];  Service:  Orthopedics;  Laterality: Left;   KNEE ARTHROTOMY Left 08/18/2023   Procedure: ARTHROTOMY, KNEE;  Surgeon: Ernie Cough, MD;  Location: [facility];  Service: Orthopedics;  Laterality: Left;  Left knee open scar debridment   NASAL SINUS SURGERY     REPLACEMENT TOTAL KNEE  01/2010   left; Dr Ernie; RIght knee 06-2010   REVERSE SHOULDER ARTHROPLASTY Right 03/14/2022   Procedure: REVERSE SHOULDER ARTHROPLASTY WITH REMOVAL OF ANATOMIC ARTHROPLASTY;  Surgeon: Melita Drivers, MD;  Location: [facility];  Service: Orthopedics;  Laterality: Right;   right shoulder arthroplasty     2018   shoulder atrhroscopic surgery  2011   Right   TOTAL KNEE REVISION Left 12/05/2021   Procedure: TOTAL KNEE REVISION;  Surgeon: Ernie Cough, MD;  Location: [facility];  Service: Orthopedics;  Laterality: Left;   TOTAL SHOULDER REVISION Right 05/09/2022   Procedure: Revision Right Reverse Shoulder Arthroplasty;  Surgeon: Melita Drivers, MD;  Location: [facility];  Service: Orthopedics;  Laterality: Right;    VENTRAL HERNIA REPAIR  03/2007   rerepair winter 2010   umbilical per pt    Family History  Problem Relation Age of Onset   Heart failure Father        MV replacement   Myelodysplastic syndrome Father    Asthma Mother    Polycythemia Brother  Asthma Other    Hypertension Other    Diabetes Maternal Grandmother    Healthy Sister    Healthy Brother    Healthy Brother    Colon cancer Neg Hx    Pancreatic cancer Neg Hx    Stomach cancer Neg Hx    Esophageal cancer Neg Hx    Colon polyps Neg Hx    Rectal cancer Neg Hx     Social History[1]   Current Medications[2]  EXAM: BP Readings from Last 3 Encounters:  02/23/24 121/67  02/17/24 138/80  12/30/23 128/66    VITALS per patient if applicable:  GENERAL: alert, oriented, appears well and in no acute distress congested but no dyspnea noted on exam virtual visit non toxic   HEENT: atraumatic, conjunttiva clear, no obvious acute abnormalities on inspection  of external nose and ears  NECK: normal movements of the head and neck  LUNGS: on inspection no signs of respiratory distress, breathing rate appears normal, no obvious gross SOB, gasping or wheezing  CV: no obvious cyanosis  PSYCH/NEURO: pleasant and cooperative, no obvious depression or anxiety, speech and thought processing grossly intact   ASSESSMENT AND PLAN:  Discussed the following assessment and plan:    ICD-10-CM   1. Lower respiratory infection (e.g., bronchitis, pneumonia, pneumonitis, pulmonitis)  J22     2. Medication management  Z79.899     3. Asthma, mild intermittent, well-controlled  J45.20      High risk underlying asthma chronic but has done well in the last few years.  Is concerned about a serious flare and pneumonia going into holiday. He may have a rhino sinusitis component  w hx of same Risk-benefit discussed will begin azithromycin  Z-Pak and have him optimize his inhalers next dose as possible prescribed.  Follow-up if not improving as expected although cough may last for quite a while. At this point not adding any steroid because does not act like asthma flare at this time. Counseled.  Establish with  new pulmonary as planned   Expectant management and discussion of plan and treatment with opportunity to ask questions and all were answered. The patient agreed with the plan and demonstrated an understanding of the instructions.  record review  plan and fu ordering 30 minutes  Advised to call back or seek an in-person evaluation if worsening  or having  further concerns  in interim. Return if symptoms worsen or fail to improve as expected.   Apolinar Eastern, MD      [1]  Social History Tobacco Use   Smoking status: Never   Smokeless tobacco: Never  Vaping Use   Vaping status: Never Used  Substance Use Topics   Alcohol  use: No    Comment: Quit drinking ETOH 1986   Drug use: No  [2]  Current Outpatient Medications:    amLODipine -benazepril   (LOTREL) 5-10 MG capsule, TAKE 1 CAPSULE BY MOUTH EVERY DAY, Disp: 90 capsule, Rfl: 2   Artificial Tear Solution (SOOTHE XP) SOLN, Place 1 drop into both eyes 3 (three) times daily as needed (for dryness)., Disp: , Rfl:    azelastine  (ASTELIN ) 0.1 % nasal spray, 1-2 sprays each nostril twice daily as needed, Disp: 30 mL, Rfl: 12   azithromycin  (ZITHROMAX ) 250 MG tablet, Take 2 tablets on day 1, then 1 tablet daily on days 2 through 5, Disp: 6 tablet, Rfl: 0   clobetasol (TEMOVATE) 0.05 % external solution, Apply 1 Application topically 2 (two) times daily as needed (for itching- as directed)., Disp: ,  Rfl:    escitalopram  (LEXAPRO ) 10 MG tablet, TAKE 1 TABLET BY MOUTH EVERY DAY, Disp: 90 tablet, Rfl: 2   fexofenadine (ALLEGRA) 180 MG tablet, Take 180 mg by mouth at bedtime., Disp: , Rfl:    fluocinonide -emollient (LIDEX -E) 0.05 % cream, Apply 1 Application topically 2 (two) times daily., Disp: 1 g, Rfl: 2   fluticasone  (FLONASE ) 50 MCG/ACT nasal spray, SPRAY 2 SPRAYS INTO EACH NOSTRIL EVERY DAY (Patient taking differently: Place 1-2 sprays into both nostrils in the morning and at bedtime.), Disp: 48 mL, Rfl: 2   ibuprofen  (ADVIL ) 800 MG tablet, Take 800 mg by mouth in the morning and at bedtime., Disp: , Rfl:    Ipratropium-Albuterol  (COMBIVENT  RESPIMAT) 20-100 MCG/ACT AERS respimat, Inhale 1 puff up to four times daily as needed (Patient taking differently: Inhale 1 puff into the lungs every 6 (six) hours as needed for wheezing or shortness of breath.), Disp: 4 g, Rfl: 11   Olopatadine  HCl (PATADAY ) 0.7 % SOLN, Place 1 drop into both eyes daily as needed (for allergies)., Disp: , Rfl:    tadalafil (CIALIS) 5 MG tablet, Take 5 mg by mouth at bedtime., Disp: , Rfl:    WIXELA INHUB 100-50 MCG/ACT AEPB, TAKE 1 PUFF BY MOUTH TWICE A DAY (Patient taking differently: Inhale 1 puff into the lungs in the morning and at bedtime.), Disp: 60 each, Rfl: 4  "

## 2024-03-29 NOTE — Telephone Encounter (Signed)
 Attempted to reach pt to set up virtual visit this afternoon 3:30pm. Left a voicemail to call us  back before 3:30pm if received it.

## 2024-04-04 ENCOUNTER — Telehealth: Payer: Self-pay | Admitting: Internal Medicine

## 2024-04-04 NOTE — Telephone Encounter (Signed)
 Spoke to patient. Patient states he cannot get clean after having bowel movements. Patient states he has no issues having a bowel movement but when he wipes he cannot get all of the stool off. Patient states he is sore with trying to wipe so many times. Patient states he is not sure if there is a tear or hemorrhoid causing the issue. Patient states that he does not have any issues with constipation or going, he just feels like he cannot get clean after he uses the bathroom - like something is hung up.  Patient is using flushable wipes to help with irritation. Patient denies any pain or bleeding with bowel movements. Patient states he even thought about getting a bidet but wasn't sure if that would work. He is not sure whatPatient has only been seen for colonoscopy's in 2021 & 2024. Scheduled to see [facility] tomorrow, 04/05/24 @ 9:40 AM. Patient verbalized understanding and very thankful for this appointment.

## 2024-04-04 NOTE — Telephone Encounter (Signed)
 Inbound call from patient stating he's been having trouble having a bowel movement. Patient states he thinks its a hemorrhoid, would like to speak to nurse and be advised on what to do Please advise  Thank you

## 2024-04-05 ENCOUNTER — Ambulatory Visit: Admitting: Gastroenterology

## 2024-04-05 ENCOUNTER — Encounter: Payer: Self-pay | Admitting: Gastroenterology

## 2024-04-05 VITALS — BP 122/70 | HR 58 | Ht 64.0 in | Wt 198.0 lb

## 2024-04-05 DIAGNOSIS — Z860101 Personal history of adenomatous and serrated colon polyps: Secondary | ICD-10-CM

## 2024-04-05 DIAGNOSIS — K602 Anal fissure, unspecified: Secondary | ICD-10-CM

## 2024-04-05 DIAGNOSIS — K6289 Other specified diseases of anus and rectum: Secondary | ICD-10-CM | POA: Diagnosis not present

## 2024-04-05 DIAGNOSIS — Z860100 Personal history of colon polyps, unspecified: Secondary | ICD-10-CM

## 2024-04-05 MED ORDER — AMBULATORY NON FORMULARY MEDICATION: 1.0000 | Freq: Three times a day (TID) | 1 refills | Status: AC

## 2024-04-05 NOTE — Patient Instructions (Addendum)
 Anal Fissure, Adult  A fissure is a linear defect in the anal mucosa, symptoms include burning, itching, discomfort especially with a bowel movement with associated rectal bleeding.  Risk factors include low fiber diet, chronic constipation and straining. Anal fissures can take a very long time to heal so this will be a 2 to 37-month process.  Treatment for a fissure includes:  -decreasing time in the toilet should not be more than 5 minutes -adding fiber supplement such as Benefiber or Citrucel -increasing water . -There is a lubricating suppository over-the-counter called Calmol-4 you can get from [facility] or from your pharmacy (may have to order) that I want to do twice daily morning and evening for 8 weeks.  This is a 60 to 80% success rate. -I am also going to send in a calcium channel blocker cream to a compound pharmacy, apply twice daily for 8 weeks.   Diltiazem/lidocaine  3 x daily for 2 months sent to compound pharmacy   Sent this medication to a compound pharmacy:  [facility] [address] [place], [place], [place] [address]  [phone]   Please DO NOT go directly from our office to pick up this medication! Give the pharmacy 1 day to process the prescription. Extra time is required for them to compound your medication.    We have sent a prescription for Diltiazem gel with Lidocaine  to [facility]. You should apply a pea size amount to your rectum three times daily x 6-8 weeks.  [facility]'s information is below: Address: [address], [place], [place] [address]  [phone]  *Please DO NOT go directly from our office to pick up this medication! Give the pharmacy 1 day to process the prescription as this is compounded and takes time to make.  _______________________________________________________  If your blood pressure at your visit was 140/90 or greater, please contact your primary care physician to follow up on  this.  _______________________________________________________  If you are age 2 or older, your body mass index should be between 23-30. Your Body mass index is 33.99 kg/m. If this is out of the aforementioned range listed, please consider follow up with your Primary Care Provider.  If you are age 47 or younger, your body mass index should be between 19-25. Your Body mass index is 33.99 kg/m. If this is out of the aformentioned range listed, please consider follow up with your Primary Care Provider.   ________________________________________________________  The [facility] providers would like to encourage you to use MYCHART to communicate with providers for non-urgent requests or questions.  Due to long hold times on the telephone, sending your provider a message by [facility] may be a faster and more efficient way to get a response.  Please allow 48 business hours for a response.  Please remember that this is for non-urgent requests.  _______________________________________________________  [facility] is using a team-based approach to care.  Your team is made up of your doctor and two to three APPS. Our APPS (Nurse Practitioners and Physician Assistants) work with your physician to ensure care continuity for you. They are fully qualified to address your health concerns and develop a treatment plan. They communicate directly with your gastroenterologist to care for you. Seeing the Advanced Practice Practitioners on your physician's team can help you by facilitating care more promptly, often allowing for earlier appointments, access to diagnostic testing, procedures, and other specialty referrals.

## 2024-04-05 NOTE — Progress Notes (Signed)
 "  Chief Complaint: Rectal discomfort Primary GI MD: Dr. Albertus  HPI: Discussed the use of AI scribe software for clinical note transcription with the patient, who gave verbal consent to proceed.  History of Present Illness   [name] is a 74 year old male with hemorrhoids who presents with perianal discomfort and difficulty with hygiene after bowel movements.  Persistent difficulty achieving cleanliness after bowel movements has resulted in perianal pain, rawness, and a sensation of chapping or cracking. The area is described as painful and sometimes cracked. Wet wipes are used to improve hygiene, but cleanliness remains a challenge. No topical treatments have been used.  Stools are frequently loose, and there is a sensation of incomplete evacuation during defecation. No difficulty passing stool and no constipation. To manage soiling, layers of tissue are placed in the perianal area, which become soiled but do not result in leakage beyond the tissue.  Occasional bleeding is observed on tissue paper. Benefiber is available at home but is not taken regularly. No sleep disturbances related to symptoms.   PREVIOUS GI WORKUP   Colonoscopy 10/2022 - Redundant colon.  - The entire examined colon is normal.  - Internal hemorrhoids.  - No specimens collected. - Repeat 5 years (10/2027)  Colonoscopy 06/24/2019 - One 4 mm polyp in the cecum, removed with a cold biopsy forceps. Resected and retrieved.  - One 4 mm polyp in the ascending colon, removed with a cold biopsy forceps. Resected and retrieved.  - One 9 mm polyp in the transverse colon, removed with a cold snare. Resected andretrieved.  - One 5 mm polyp in the transverse colon, removed with a cold snare. Resected and retrieved.  - Internal hemorrhoids. - 3 Tas and 1 SSP  Past Medical History:  Diagnosis Date   Allergy     Anxiety    Arthritis    knees   Asthma    Depression    Dysrhythmia    RBBB   Elevated PSA     us  and bx negative 2011   Hypertension    nl cardiolite in 1998   Pneumonia 2005   right hospitalized    Rhinosinusitis    Sleep apnea    wears CPAP    Past Surgical History:  Procedure Laterality Date   COLONOSCOPY  03/09/2013   ECTROPION REPAIR Left    2023   EYE SURGERY Left 04/08/2022   to bring lower eyelid up   FACIAL RECONSTRUCTION SURGERY     1992   HERNIA REPAIR     IRRIGATION AND DEBRIDEMENT KNEE Left 11/26/2023   Procedure: IRRIGATION AND DEBRIDEMENT KNEE;  Surgeon: Ernie Cough, MD;  Location: [facility];  Service: Orthopedics;  Laterality: Left;   KNEE ARTHROTOMY Left 08/18/2023   Procedure: ARTHROTOMY, KNEE;  Surgeon: Ernie Cough, MD;  Location: [facility];  Service: Orthopedics;  Laterality: Left;  Left knee open scar debridment   NASAL SINUS SURGERY     REPLACEMENT TOTAL KNEE  01/2010   left; Dr Ernie; RIght knee 06-2010   REVERSE SHOULDER ARTHROPLASTY Right 03/14/2022   Procedure: REVERSE SHOULDER ARTHROPLASTY WITH REMOVAL OF ANATOMIC ARTHROPLASTY;  Surgeon: Melita Drivers, MD;  Location: [facility];  Service: Orthopedics;  Laterality: Right;   right shoulder arthroplasty     2018   shoulder atrhroscopic surgery  2011   Right   TOTAL KNEE REVISION Left 12/05/2021   Procedure: TOTAL KNEE REVISION;  Surgeon: Ernie Cough, MD;  Location: [facility];  Service: Orthopedics;  Laterality: Left;  TOTAL SHOULDER REVISION Right 05/09/2022   Procedure: Revision Right Reverse Shoulder Arthroplasty;  Surgeon: Melita Drivers, MD;  Location: [facility];  Service: Orthopedics;  Laterality: Right;    VENTRAL HERNIA REPAIR  03/2007   rerepair winter 2010   umbilical per pt    Current Outpatient Medications  Medication Sig Dispense Refill   AMBULATORY NON FORMULARY MEDICATION Place 1 Application rectally in the morning, at noon, and at bedtime. Medication Name: Medication Name: Diltiazem 2 % with 5% Lidocaine  30 g 1   amLODipine -benazepril  (LOTREL) 5-10 MG capsule TAKE 1 CAPSULE BY MOUTH  EVERY DAY 90 capsule 2   Artificial Tear Solution (SOOTHE XP) SOLN Place 1 drop into both eyes 3 (three) times daily as needed (for dryness).     azelastine  (ASTELIN ) 0.1 % nasal spray 1-2 sprays each nostril twice daily as needed 30 mL 12   clobetasol (TEMOVATE) 0.05 % external solution Apply 1 Application topically 2 (two) times daily as needed (for itching- as directed).     escitalopram  (LEXAPRO ) 10 MG tablet TAKE 1 TABLET BY MOUTH EVERY DAY 90 tablet 2   fexofenadine (ALLEGRA) 180 MG tablet Take 180 mg by mouth at bedtime.     fluocinonide -emollient (LIDEX -E) 0.05 % cream Apply 1 Application topically 2 (two) times daily. 1 g 2   fluticasone  (FLONASE ) 50 MCG/ACT nasal spray SPRAY 2 SPRAYS INTO EACH NOSTRIL EVERY DAY (Patient taking differently: Place 1-2 sprays into both nostrils in the morning and at bedtime.) 48 mL 2   ibuprofen  (ADVIL ) 800 MG tablet Take 800 mg by mouth in the morning and at bedtime.     Ipratropium-Albuterol  (COMBIVENT  RESPIMAT) 20-100 MCG/ACT AERS respimat Inhale 1 puff up to four times daily as needed (Patient taking differently: Inhale 1 puff into the lungs every 6 (six) hours as needed for wheezing or shortness of breath.) 4 g 11   Olopatadine  HCl (PATADAY ) 0.7 % SOLN Place 1 drop into both eyes daily as needed (for allergies).     tadalafil (CIALIS) 5 MG tablet Take 5 mg by mouth at bedtime.     WIXELA INHUB 100-50 MCG/ACT AEPB TAKE 1 PUFF BY MOUTH TWICE A DAY (Patient taking differently: Inhale 1 puff into the lungs in the morning and at bedtime.) 60 each 4   No current facility-administered medications for this visit.    Allergies as of 04/05/2024 - Review Complete 04/05/2024  Allergen Reaction Noted   Tape Other (See Comments) 07/08/2019   Egg protein-containing drug products Diarrhea, Nausea Only, and Other (See Comments) 02/10/2013    Family History  Problem Relation Age of Onset   Heart failure Father        MV replacement   Myelodysplastic syndrome  Father    Asthma Mother    Polycythemia Brother    Asthma Other    Hypertension Other    Diabetes Maternal Grandmother    Healthy Sister    Healthy Brother    Healthy Brother    Colon cancer Neg Hx    Pancreatic cancer Neg Hx    Stomach cancer Neg Hx    Esophageal cancer Neg Hx    Colon polyps Neg Hx    Rectal cancer Neg Hx     Social History   Socioeconomic History   Marital status: Single    Spouse name: Not on file   Number of children: Not on file   Years of education: Not on file   Highest education level: Bachelor's degree (e.g., BA, AB,  BS)  Occupational History   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   Stopped drinking in 1986    Nonosmoker    Single   HHof 1    Social Drivers of Health   Tobacco Use: Low Risk (03/29/2024)   Patient History    Smoking Tobacco Use: Never    Smokeless Tobacco Use: Never    Passive Exposure: Not on file  Financial Resource Strain: Low Risk (07/06/2023)   Overall Financial Resource Strain (CARDIA)    Difficulty of Paying Living Expenses: Not hard at all  Food Insecurity: No Food Insecurity (11/25/2023)   Epic    Worried About Radiation Protection Practitioner of Food in the Last Year: Never true    Ran Out of Food in the Last Year: Never true  Transportation Needs: No Transportation Needs (11/25/2023)   Epic    Lack of Transportation (Medical): No    Lack of Transportation (Non-Medical): No  Physical Activity: Insufficiently Active (07/06/2023)   Exercise Vital Sign    Days of Exercise per Week: 2 days    Minutes of Exercise per Session: 60 min  Stress: No Stress Concern Present  (07/06/2023)   [employer] of Occupational Health - Occupational Stress Questionnaire    Feeling of Stress : Not at all  Recent Concern: Stress - Stress Concern Present (06/16/2023)   [employer] of Occupational Health - Occupational Stress Questionnaire    Feeling of Stress : To some extent  Social Connections: Moderately Isolated (11/25/2023)   Social Connection and Isolation Panel    Frequency of Communication with Friends and Family: More than three times a week    Frequency of Social Gatherings with Friends and Family: More than three times a week    Attends Religious Services: Never    Database Administrator or Organizations: Yes    Attends Engineer, Structural: More than 4 times per year    Marital Status: Divorced  Intimate Partner Violence: Not At Risk (11/25/2023)   Epic    Fear of Current or Ex-Partner: No    Emotionally Abused: No    Physically Abused: No    Sexually Abused: No  Depression (PHQ2-9): Low Risk (10/14/2023)   Depression (PHQ2-9)    PHQ-2 Score: 1  Alcohol  Screen: Low Risk (07/06/2023)   Alcohol  Screen    Last Alcohol  Screening Score (AUDIT): 0  Housing: Low Risk (11/25/2023)   Epic    Unable to Pay for Housing in the Last Year: No    Number of Times Moved in the Last Year: 0    Homeless in the Last Year: No  Utilities: Not At Risk (11/25/2023)   Epic    Threatened with loss of utilities: No  Health Literacy: Adequate Health Literacy (07/06/2023)   B1300 Health Literacy    Frequency of need for help with medical instructions: Never    Review of Systems:    Constitutional: No weight loss, fever, chills, weakness or fatigue HEENT: Eyes: No change in vision  Ears, Nose, Throat:  No change in hearing or congestion Skin: No rash or itching Cardiovascular: No chest pain, chest pressure or palpitations   Respiratory: No SOB or cough Gastrointestinal: See HPI and otherwise negative Genitourinary: No dysuria or change in urinary  frequency Neurological: No headache, dizziness or syncope Musculoskeletal: No new muscle or joint pain Hematologic: No bleeding or bruising Psychiatric: No history of depression or anxiety    Physical Exam:  Vital signs: BP 122/70   Pulse (!) 58   Ht 5' 4 (1.626 m)   Wt 198 lb (89.8 kg)   BMI 33.99 kg/m   Constitutional: NAD, alert and cooperative Head:  Normocephalic and atraumatic. Eyes:   PEERL, EOMI. No icterus. Conjunctiva pink. Respiratory: Respirations even and unlabored. Lungs clear to auscultation bilaterally.   No wheezes, crackles, or rhonchi.  Cardiovascular:  Regular rate and rhythm. No peripheral edema, cyanosis or pallor.  Gastrointestinal:  Soft, nondistended, nontender. No rebound or guarding. Normal bowel sounds. No appreciable masses or hepatomegaly. Rectal: Sonny CMA chaperone. Small external hemorrhoid. Normal sphincter tone. 1-59mm anal fissure 9 oclock. No appreciable internal hemorrhoids. Msk:  Symmetrical without gross deformities. Without edema, no deformity or joint abnormality.  Neurologic:  Alert and  oriented x4;  grossly normal neurologically.  Skin:   Dry and intact without significant lesions or rashes. Psychiatric: Oriented to person, place and time. Demonstrates good judgement and reason without abnormal affect or behaviors.  Physical Exam    RELEVANT LABS AND IMAGING: CBC    Component Value Date/Time   WBC 8.9 11/28/2023 0821   RBC 3.31 (L) 11/28/2023 0821   HGB 11.7 (L) 11/28/2023 0821   HGB 13.8 07/25/2016 0839   HCT 35.2 (L) 11/28/2023 0821   HCT 40.2 07/25/2016 0839   PLT 159 11/28/2023 0821   PLT 153 07/25/2016 0839   MCV 106.3 (H) 11/28/2023 0821   MCV 101.3 (H) 07/25/2016 0839   MCH 35.3 (H) 11/28/2023 0821   MCHC 33.2 11/28/2023 0821   RDW 14.2 11/28/2023 0821   RDW 13.7 07/25/2016 0839   LYMPHSABS 0.6 (L) 10/14/2023 1001   LYMPHSABS 1.1 07/25/2016 0839   MONOABS 0.8 10/14/2023 1001   MONOABS 0.8 07/25/2016 0839    EOSABS 0.5 10/14/2023 1001   EOSABS 0.9 (H) 07/25/2016 0839   BASOSABS 0.0 10/14/2023 1001   BASOSABS 0.0 07/25/2016 0839    CMP     Component Value Date/Time   NA 132 (L) 11/27/2023 0324   NA 136 07/25/2016 0839   K 4.8 11/27/2023 0324   K 4.6 07/25/2016 0839   CL 99 11/27/2023 0324   CO2 22 11/27/2023 0324   CO2 25 07/25/2016 0839   GLUCOSE 182 (H) 11/27/2023 0324   GLUCOSE 90 07/25/2016 0839   BUN 16 11/27/2023 0324   BUN 19.2 07/25/2016 0839   CREATININE 0.93 11/27/2023 0324   CREATININE 0.72 10/15/2020 1344   CREATININE 0.8 07/25/2016 0839   CALCIUM 9.0 11/27/2023 0324   CALCIUM 9.3 07/25/2016 0839   PROT 6.8 02/11/2023 1253   PROT 6.9 07/25/2016 0839   ALBUMIN 4.5 02/11/2023 1253   ALBUMIN 4.3 07/25/2016 0839   AST 19 02/11/2023 1253   AST 26 10/15/2020 1344   AST 22 07/25/2016 0839   ALT 18 02/11/2023 1253   ALT 21 10/15/2020 1344   ALT 23 07/25/2016 0839   ALKPHOS 68 02/11/2023 1253   ALKPHOS 68 07/25/2016 0839   BILITOT 0.9 02/11/2023 1253   BILITOT 1.0 10/15/2020 1344   BILITOT  0.68 07/25/2016 0839   GFRNONAA >60 11/27/2023 0324   GFRNONAA >60 10/15/2020 1344   GFRAA  07/03/2010 0438    >60        The eGFR has been calculated using the MDRD equation. This calculation has not been validated in all clinical situations. eGFR's persistently <60 mL/min signify possible Chronic Kidney Disease.     Assessment/Plan:   Rectal discomfort Up to date on colonoscopy. History of internal hemorrhoids, feeling of incomplete evacuation, normal soft formed stools overall. Anal fissure on exam likely leading to bleeding and irritation. Anoscopy deferred due to patient discomfort. - Sitz baths, high-fiber diet, add on benefiber.  - Long discussion about preventing constipation discussed in detail for prevention.  - squatty potty - Use calmol4 suppositories twice a day for 6 weeks - Diltiazem 2%/lidocaine  5% 3 x daily for 2 months on rectum #30 gram 1 refill -  Follow up 2-3 months for secondary evaluation and possible surgical referral for repair if not improved.  History of colon polyps Colonoscopy 2021 with 3 tubular adenomas and 1 SSP.  Repeat colonoscopy 2024 with no polyps and recall 5 years due to previous colonoscopy - Repeat 10/2027 (or sooner if persistent symptoms)   Nestor Mollie DEVONNA [facility] 04/05/2024, 11:41 AM  Cc: Charlett Apolinar POUR, MD "

## 2024-04-15 ENCOUNTER — Other Ambulatory Visit: Payer: Self-pay | Admitting: Internal Medicine

## 2024-04-19 ENCOUNTER — Other Ambulatory Visit: Payer: Self-pay

## 2024-04-19 ENCOUNTER — Telehealth: Payer: Self-pay

## 2024-04-19 MED ORDER — COMBIVENT RESPIMAT 20-100 MCG/ACT IN AERS: 1.0000 | INHALATION_SPRAY | Freq: Four times a day (QID) | RESPIRATORY_TRACT | 0 refills | Status: AC | PRN

## 2024-04-19 NOTE — Telephone Encounter (Signed)
 Copied from CRM #[id number]. Topic: Clinical - Medication Refill >> Apr 19, 2024 11:22 AM Rozanna MATSU wrote: Medication: Ipratropium-Albuterol  (COMBIVENT  RESPIMAT) 20-100 MCG/ACT AERS respimat  Has the patient contacted their pharmacy? No (Agent: If no, request that the patient contact the pharmacy for the refill. If patient does not wish to contact the pharmacy document the reason why and proceed with request.) (Agent: If yes, when and what did the pharmacy advise?)  This is the patient's preferred pharmacy:  [id number] - [place], [place] - [address] [facility] [address] [place] [address] Phone: [phone] Fax: [phone]  Is this the correct pharmacy for this prescription? Yes If no, delete pharmacy and type the correct one.   Has the prescription been filled recently? Yes  Is the patient out of the medication? Yes  Has the patient been seen for an appointment in the last year OR does the patient have an upcoming appointment? Yes  Can we respond through MyChart? Yes  Agent: Please be advised that Rx refills may take up to 3 business days. We ask that you follow-up with your pharmacy.  Patient has up coming appointment sent refill to pharmacy

## 2024-04-19 NOTE — Telephone Encounter (Signed)
 Copied from CRM [id number]. Topic: Appointments - Transfer of Care >> Apr 19, 2024 11:21 AM Rozanna MATSU wrote: Pt is requesting to transfer FROM: [facility] Pt is requesting to transfer TO: [place] Reason for requested transfer: transfer per note  It is the responsibility of the team the patient would like to transfer to (Dr. Madelin Stank) to reach out to the patient if for any reason this transfer is not acceptable.   Has up coming appointment scheduled

## 2024-04-27 ENCOUNTER — Ambulatory Visit: Admitting: Adult Health

## 2024-04-27 ENCOUNTER — Encounter: Payer: Self-pay | Admitting: Adult Health

## 2024-04-27 VITALS — BP 138/81 | HR 60 | Temp 97.7°F | Ht 62.0 in | Wt 200.2 lb

## 2024-04-27 DIAGNOSIS — J9811 Atelectasis: Secondary | ICD-10-CM | POA: Diagnosis not present

## 2024-04-27 DIAGNOSIS — G4733 Obstructive sleep apnea (adult) (pediatric): Secondary | ICD-10-CM

## 2024-04-27 DIAGNOSIS — R918 Other nonspecific abnormal finding of lung field: Secondary | ICD-10-CM

## 2024-04-27 DIAGNOSIS — K449 Diaphragmatic hernia without obstruction or gangrene: Secondary | ICD-10-CM | POA: Diagnosis not present

## 2024-04-27 DIAGNOSIS — J309 Allergic rhinitis, unspecified: Secondary | ICD-10-CM

## 2024-04-27 DIAGNOSIS — J4530 Mild persistent asthma, uncomplicated: Secondary | ICD-10-CM

## 2024-04-27 NOTE — Progress Notes (Signed)
 "  @Patient  ID: [name], male    DOB: 1950/10/27, 74 y.o.   MRN: [id number]  Chief Complaint  Patient presents with   Medical Management of Chronic Issues    Sleep    Referring provider: Charlett Apolinar POUR, MD  HPI: 74 yo male never smoker followed for OSA with Hypersomnia, Asthma, Allergic Rhinitis, Hypereosinophilia Scuba diver   TEST/EVENTS : Reviewed 04/27/2024  Discussed the use of AI scribe software for clinical note transcription with the patient, who gave verbal consent to proceed.  History of Present Illness [name] is a 74 year old male with sleep apnea who presents for a follow-up visit.  He uses a CPAP machine with a nasal mask and reports generally good compliance, though he has not used it for the past two nights due to disruptions caused by a semi-feral cat he adopted.  CPAP download shows excellent compliance with daily average usage at 9 hours.  Patient is on auto CPAP 10 to 20 cm H2O.  AHI 1.4/hour daily average pressure at 13.3 cm H2O.  He has a history of asthma, well-controlled with Wixela, one puff twice daily. His allergy  regimen includes Allegra and Flonase . He uses Combivent  as needed, typically once a day, and has a nebulizer that he has not used in years. He is very active, cycling about twenty miles a day when weather permits, and scuba diving, although he has not been able to dive in the past year.  Patient says he had a recent CT scan in the fall of last year that had some abnormalities. He has never smoked and is current on his pneumonia vaccine.  CT chest January 20, 2024 showed a new left lower lobe nodule measuring 10 mm multiple additional scattered pulmonary nodules, right sided diaphragmatic hernia with right lower lobe atelectasis.  We discussed a follow-up CT chest to follow lung nodularity.  Patient has no hemoptysis or unintentional weight loss  He has a history of a left knee staph infection  requiring hospitalization for IV  antibiotics and a PICC line for home antibiotics. Recently, he experienced redness and swelling in the knee and is currently on cefadroxil 500 mg capsules for a couple of weeks.  Says symptoms are improved.  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    Allergies[1]  Immunization History  Administered Date(s) Administered   Fluad Quad(high Dose 65+) 01/11/2019, 01/06/2020, 12/24/2020   Fluzone Influenza virus vaccine,trivalent (IIV3), split virus 02/02/2007   H1N1 04/03/2008   INFLUENZA, HIGH DOSE SEASONAL PF 12/31/2021, 12/17/2022, 12/13/2023   Influenza Split 04/08/2011, 02/19/2012, 12/27/2015   Influenza Whole 02/02/2007, 03/14/2009, 01/28/2010   Influenza,inj,Quad PF,6+ Mos 12/01/2012, 12/16/2013, 12/20/2014, 12/30/2016, 01/05/2018   Influenza,inj,quad, With Preservative 12/29/2016   Moderna Covid-19 Vaccine Bivalent Booster 53yrs & up 12/13/2023   Moderna Sars-Covid-2 Vaccination 05/02/2019, 05/31/2019, 01/22/2020, 06/29/2020   PNEUMOCOCCAL CONJUGATE-20 11/06/2021   Pfizer Covid-19 Vaccine Bivalent Booster 89yrs & up 12/24/2020, 11/27/2022   Pneumococcal Conjugate-13 10/19/2013   Pneumococcal Polysaccharide-23 09/17/2009, 11/07/2016   Respiratory Syncytial Virus Vaccine,Recomb Aduvanted(Arexvy) 12/31/2021   Td 09/17/2009   Tdap 06/22/2023, 12/13/2023   Unspecified SARS-COV-2 Vaccination 01/08/2022, 09/22/2022   Zoster Recombinant(Shingrix ) 11/07/2016, 02/18/2017   Zoster, Live 11/02/2015    Past Medical History:  Diagnosis Date   Allergy     Anxiety  Arthritis    knees   Asthma    Depression    Dysrhythmia    RBBB   Elevated PSA    us  and bx negative 2011   Hypertension    nl cardiolite in 1998   Pneumonia 2005   right hospitalized    Rhinosinusitis    Sleep apnea    wears CPAP     Tobacco History: Tobacco Use History[2] Counseling given: Not Answered   Outpatient Medications Prior to Visit  Medication Sig Dispense Refill   amLODipine -benazepril  (LOTREL) 5-10 MG capsule TAKE 1 CAPSULE BY MOUTH EVERY DAY 90 capsule 2   Artificial Tear Solution (SOOTHE XP) SOLN Place 1 drop into both eyes 3 (three) times daily as needed (for dryness).     azelastine  (ASTELIN ) 0.1 % nasal spray 1-2 sprays each nostril twice daily as needed 30 mL 12   clobetasol (TEMOVATE) 0.05 % external solution Apply 1 Application topically 2 (two) times daily as needed (for itching- as directed).     escitalopram  (LEXAPRO ) 10 MG tablet TAKE 1 TABLET BY MOUTH EVERY DAY 90 tablet 2   fexofenadine (ALLEGRA) 180 MG tablet Take 180 mg by mouth at bedtime.     fluticasone  (FLONASE ) 50 MCG/ACT nasal spray SPRAY 2 SPRAYS INTO EACH NOSTRIL EVERY DAY (Patient taking differently: Place 1-2 sprays into both nostrils in the morning and at bedtime.) 48 mL 2   ibuprofen  (ADVIL ) 800 MG tablet Take 800 mg by mouth in the morning and at bedtime.     Ipratropium-Albuterol  (COMBIVENT  RESPIMAT) 20-100 MCG/ACT AERS respimat Inhale 1 puff into the lungs every 6 (six) hours as needed for wheezing or shortness of breath. 1 each 0   Olopatadine  HCl (PATADAY ) 0.7 % SOLN Place 1 drop into both eyes daily as needed (for allergies).     tadalafil (CIALIS) 5 MG tablet Take 5 mg by mouth at bedtime.     WIXELA INHUB 100-50 MCG/ACT AEPB INHALE 1 PUFF BY MOUTH TWICE A DAY 60 each 4   AMBULATORY NON FORMULARY MEDICATION Place 1 Application rectally in the morning, at noon, and at bedtime. Medication Name: Medication Name: Diltiazem 2 % with 5% Lidocaine  (Patient not taking: Reported on 04/27/2024) 30 g 1   fluocinonide -emollient (LIDEX -E) 0.05 % cream Apply 1 Application topically 2 (two) times daily. (Patient not taking: Reported on 04/27/2024) 1 g 2   No facility-administered medications prior to visit.     Review of Systems:    Constitutional:   No  weight loss, night sweats,  Fevers, chills, fatigue, or  lassitude.  HEENT:   No headaches,  Difficulty swallowing,  Tooth/dental problems, or  Sore throat,                No sneezing, itching, ear ache, nasal congestion, post nasal drip,   CV:  No chest pain,  Orthopnea, PND, swelling in lower extremities, anasarca, dizziness, palpitations, syncope.   GI  No heartburn, indigestion, abdominal pain, nausea, vomiting, diarrhea, change in bowel habits, loss of appetite, bloody stools.   Resp: No shortness of breath with exertion or at rest.  No excess mucus, no productive cough,  No non-productive cough,  No coughing up of blood.  No change in color of mucus.  No wheezing.  No chest wall deformity  Skin: no rash or lesions.  GU: no dysuria, change in color of urine, no urgency or frequency.  No flank pain, no hematuria   MS:  No joint pain or swelling.  No  decreased range of motion.  No back pain.    Physical Exam  BP 138/81   Pulse 60   Temp 97.7 F (36.5 C)   Ht 5' 2 (1.575 m) Comment: Per pt  Wt 200 lb 3.2 oz (90.8 kg)   SpO2 98% Comment: RA  BMI 36.62 kg/m   GEN: A/Ox3; pleasant , NAD, well nourished    HEENT:  Dresser/AT,  , NOSE-clear, THROAT-clear, no lesions, no postnasal drip or exudate noted.   NECK:  Supple w/ fair ROM; no JVD; normal carotid impulses w/o bruits; no thyromegaly or nodules palpated; no lymphadenopathy.    RESP  Clear  P & A; w/o, wheezes/ rales/ or rhonchi. no accessory muscle use, no dullness to percussion  CARD:  RRR, no m/r/g, no peripheral edema, pulses intact, no cyanosis or clubbing.  GI:   Soft & nt; nml bowel sounds; no organomegaly or masses detected.   Musco: Warm bil, no deformities or joint swelling noted.   Neuro: alert, no focal deficits noted.    Skin: Warm, no lesions or rashes    Lab Results:Reviewed 04/27/2024   CBC   BMET   BNP No results found for: BNP  ProBNP No results found for:  PROBNP  Imaging: No results found.  Administration History     None           No data to display          Lab Results  Component Value Date   NITRICOXIDE 30 12/27/2015        No data to display              Assessment & Plan:   Assessment and Plan Assessment & Plan Obstructive sleep apnea  -excellent control and compliance on nocturnal CPAP with perceived benefit His obstructive sleep apnea is well-managed with CPAP therapy, He feels unwell when not using CPAP.SABRA Continue CPAP therapy with a nasal mask. Use saline nasal spray or gel for nasal dryness. Replace CPAP mask and headgear as needed to ensure proper fit. Encourage consistent CPAP usage.  Mild persistent asthma  -stable His mild persistent asthma is well-controlled with the current medication regimen. He continues regular physical activity, including cycling, without significant respiratory issues. Continue Wixela, one puff twice daily. Use Combivent  as needed for symptom relief. A pulmonary function test is ordered to assess current lung function.  Pulmonary nodules  -new nodularity noted on most recent CT scan. Multiple pulmonary nodules identified on recent imaging, with some present for over 20 years,These nodules may represent previous infections or inflammation. No current respiratory symptoms suggest malignancy. A CT scan is ordered to evaluate pulmonary nodules and compare with previous imaging. Monitor for any new symptoms such as weight loss or hemoptysis.  Right diaphragmatic hernia with atelectasis   A right diaphragmatic hernia with associated atelectasis is noted on imaging, likely chronic and asymptomatic. No significant respiratory compromise is observed. Continue to monitor for any changes in symptoms or respiratory function.  Plan  Patient Instructions  CT chest to follow lung nodules  Set up PFT   Continue on CPAP At bedtime, wear all night long.  Work on healthy weight loss  Saline  nasal spray Twice daily   Saline nasal gel At bedtime    Continue on Wixela 1 puff Twice daily   Combivent   or Duoneb As needed   Continue on Allegra and Flonase  daily   Follow up with Dr. Theodoro or Beaux Wedemeyer NP in 6 weeks with PFT  and As needed             Madelin Stank, NP 04/27/2024  I spent   36 minutes dedicated to the care of this patient on the date of this encounter to include pre-visit review of records, face-to-face time with the patient discussing conditions above, post visit ordering of testing, clinical documentation with the electronic health record, making appropriate referrals as documented, and communicating necessary findings to members of the patients care team.      [1]  Allergies Allergen Reactions   Tape Other (See Comments)    Causes bleeding and redness of the skin.SABRA SKIN IS VERY THIN!!   Egg Protein-Containing Drug Products Diarrhea, Nausea Only and Other (See Comments)    Terrible stomach pain  [2]  Social History Tobacco Use  Smoking Status Never  Smokeless Tobacco Never   "

## 2024-04-27 NOTE — Patient Instructions (Addendum)
 CT chest to follow lung nodules  Set up PFT   Continue on CPAP At bedtime, wear all night long.  Work on healthy weight loss  Saline nasal spray Twice daily   Saline nasal gel At bedtime    Continue on Wixela 1 puff Twice daily   Combivent   or Duoneb As needed   Continue on Allegra and Flonase  daily   Follow up with Dr. Theodoro or Darvin Dials NP in 6 weeks with PFT and As needed

## 2024-05-25 ENCOUNTER — Other Ambulatory Visit

## 2024-06-01 ENCOUNTER — Ambulatory Visit: Admitting: Gastroenterology

## 2024-06-09 ENCOUNTER — Encounter

## 2024-06-09 ENCOUNTER — Ambulatory Visit: Admitting: Adult Health

## 2024-06-13 ENCOUNTER — Ambulatory Visit: Admitting: Podiatry

## 2024-07-11 ENCOUNTER — Ambulatory Visit
